# Patient Record
Sex: Male | Born: 1935 | Race: White | Smoking: Never smoker
Health system: Southern US, Community
[De-identification: ages and names within clinical notes are randomized; demographics above are authoritative.]

## PROBLEM LIST (undated history)

## (undated) DIAGNOSIS — K5790 Diverticulosis of intestine, part unspecified, without perforation or abscess without bleeding: Secondary | ICD-10-CM

## (undated) DIAGNOSIS — E559 Vitamin D deficiency, unspecified: Secondary | ICD-10-CM

## (undated) DIAGNOSIS — R7309 Other abnormal glucose: Secondary | ICD-10-CM

## (undated) DIAGNOSIS — I10 Essential (primary) hypertension: Secondary | ICD-10-CM

## (undated) DIAGNOSIS — E785 Hyperlipidemia, unspecified: Secondary | ICD-10-CM

## (undated) DIAGNOSIS — K648 Other hemorrhoids: Secondary | ICD-10-CM

## (undated) DIAGNOSIS — K922 Gastrointestinal hemorrhage, unspecified: Secondary | ICD-10-CM

## (undated) DIAGNOSIS — N138 Other obstructive and reflux uropathy: Secondary | ICD-10-CM

## (undated) DIAGNOSIS — K625 Hemorrhage of anus and rectum: Secondary | ICD-10-CM

## (undated) DIAGNOSIS — R739 Hyperglycemia, unspecified: Secondary | ICD-10-CM

## (undated) DIAGNOSIS — N401 Enlarged prostate with lower urinary tract symptoms: Secondary | ICD-10-CM

## (undated) DIAGNOSIS — R001 Bradycardia, unspecified: Secondary | ICD-10-CM

## (undated) HISTORY — DX: Hyperlipidemia, unspecified: E78.5

## (undated) HISTORY — DX: Benign prostatic hyperplasia with lower urinary tract symptoms: N13.8

## (undated) HISTORY — DX: Other hemorrhoids: K64.8

## (undated) HISTORY — PX: HEMORRHOID BANDING: SHX5850

## (undated) HISTORY — DX: Hemorrhage of anus and rectum: K62.5

## (undated) HISTORY — DX: Diverticulosis of intestine, part unspecified, without perforation or abscess without bleeding: K57.90

## (undated) HISTORY — DX: Bradycardia, unspecified: R00.1

## (undated) HISTORY — DX: Hyperglycemia, unspecified: R73.9

## (undated) HISTORY — PX: TONSILLECTOMY: SUR1361

## (undated) HISTORY — DX: Essential (primary) hypertension: I10

## (undated) HISTORY — DX: Other abnormal glucose: R73.09

## (undated) HISTORY — DX: Other obstructive and reflux uropathy: N40.1

## (undated) HISTORY — DX: Vitamin D deficiency, unspecified: E55.9

## (undated) HISTORY — PX: ROTATOR CUFF REPAIR: SHX139

## (undated) HISTORY — DX: Gastrointestinal hemorrhage, unspecified: K92.2

---

## 1968-10-07 HISTORY — PX: OTHER SURGICAL HISTORY: SHX169

## 1978-02-06 HISTORY — PX: ANAL FISSURE REPAIR: SHX2312

## 1997-12-14 ENCOUNTER — Ambulatory Visit (HOSPITAL_COMMUNITY): Admission: RE | Admit: 1997-12-14 | Discharge: 1997-12-14 | Payer: Self-pay | Admitting: Internal Medicine

## 1999-01-27 ENCOUNTER — Ambulatory Visit (HOSPITAL_COMMUNITY): Admission: RE | Admit: 1999-01-27 | Discharge: 1999-01-27 | Payer: Self-pay | Admitting: Gastroenterology

## 2002-11-17 ENCOUNTER — Encounter: Payer: Self-pay | Admitting: Orthopedic Surgery

## 2002-11-24 ENCOUNTER — Observation Stay (HOSPITAL_COMMUNITY): Admission: RE | Admit: 2002-11-24 | Discharge: 2002-11-25 | Payer: Self-pay | Admitting: Orthopedic Surgery

## 2005-04-11 ENCOUNTER — Ambulatory Visit (HOSPITAL_COMMUNITY): Admission: RE | Admit: 2005-04-11 | Discharge: 2005-04-12 | Payer: Self-pay | Admitting: Orthopedic Surgery

## 2005-06-09 ENCOUNTER — Ambulatory Visit: Payer: Self-pay | Admitting: Internal Medicine

## 2007-08-21 ENCOUNTER — Telehealth: Payer: Self-pay | Admitting: Gastroenterology

## 2008-07-08 ENCOUNTER — Ambulatory Visit: Payer: Self-pay | Admitting: Gastroenterology

## 2008-07-10 ENCOUNTER — Telehealth: Payer: Self-pay | Admitting: Gastroenterology

## 2008-07-16 ENCOUNTER — Ambulatory Visit: Payer: Self-pay | Admitting: Gastroenterology

## 2009-08-26 ENCOUNTER — Encounter (INDEPENDENT_AMBULATORY_CARE_PROVIDER_SITE_OTHER): Payer: Self-pay | Admitting: Internal Medicine

## 2009-08-26 ENCOUNTER — Ambulatory Visit
Admission: RE | Admit: 2009-08-26 | Discharge: 2009-08-26 | Payer: Self-pay | Source: Home / Self Care | Admitting: Internal Medicine

## 2009-08-26 ENCOUNTER — Ambulatory Visit: Payer: Self-pay | Admitting: Vascular Surgery

## 2010-03-08 NOTE — Progress Notes (Signed)
Summary: ? re prep  Phone Note Call from Patient Call back at Home Phone [phone]   Caller: Patient Call For: Arlyce Dice Reason for Call: Talk to Nurse Summary of Call: Patient has questions regarding prep instructions Initial call taken by: Tawni Levy,  July 10, 2008 8:56 AM  Follow-up for Phone Call        AWhite cranberry juice ok Follow-up by: Wyona Almas RN,  July 10, 2008 9:04 AM

## 2010-03-08 NOTE — Progress Notes (Signed)
Summary: Michael Ware is recall colon due?  Phone Note Call from Patient Call back at Home Phone [phone]   Caller: Patient Call For: KAPLAN Reason for Call: Talk to Nurse Details for Reason: [facility] COLON Summary of Call: pt had last COLON in 2000 and wants to sch colon but recalls the last one was done in the HOSP, Does pt have to sch his next colon @ hosp? Initial call taken by: [name],  August 21, 2007 3:48 PM  Follow-up for Phone Call        Pt. wants to know when he needs another Colonoscopy and if he needs an OV prior. Has occ. problems with anal fissure and bleeding. Per Dr.Kaplan-Pt. needs recall Colon in 01/2009, with an OV sometime prior to that. Pt. states he isn't having any problems currently.  Pt. instructed to call back as needed. (Recall is in IDX.)  Follow-up by: [name] LPN,  August 23, 2007 9:49 AM

## 2010-03-08 NOTE — Miscellaneous (Signed)
Summary: [facility] Previsit/prep  Clinical Lists Changes  Medications: Added new medication of DULCOLAX 5 MG  TBEC (BISACODYL) Day before procedure take 2 at 3pm and 2 at 8pm. - Signed Added new medication of METOCLOPRAMIDE HCL 10 MG  TABS (METOCLOPRAMIDE HCL) As per prep instructions. - Signed Added new medication of MIRALAX   POWD (POLYETHYLENE GLYCOL 3350) As per prep  instructions. - Signed Rx of DULCOLAX 5 MG  TBEC (BISACODYL) Day before procedure take 2 at 3pm and 2 at 8pm.;  #4 x 0;  Signed;  Entered by: Wyona Almas RN;  Authorized by: Louis Meckel MD;  Method used: Electronically to [facility]  [phone]*, [address], [place], [place], [place]  [address], Ph: [phone] or [phone], Fax: [phone] Rx of METOCLOPRAMIDE HCL 10 MG  TABS (METOCLOPRAMIDE HCL) As per prep instructions.;  #2 x 0;  Signed;  Entered by: Wyona Almas RN;  Authorized by: Louis Meckel MD;  Method used: Electronically to [facility]  [phone]*, [address] [place], [place], [place]  [address], Ph: [phone] or [phone], Fax: [phone] Rx of MIRALAX   POWD (POLYETHYLENE GLYCOL 3350) As per prep  instructions.;  #255gm x 0;  Signed;  Entered by: Wyona Almas RN;  Authorized by: Louis Meckel MD;  Method used: Electronically to [facility]  [phone]*, [address] [place], [place], [place]  [address], Ph: [phone] or [phone], Fax: [phone] Observations: Added new observation of ALLERGY REV: Done (07/08/2008 15:32) Added new observation of NKA: T (07/08/2008 15:32)    Prescriptions: MIRALAX   POWD (POLYETHYLENE GLYCOL 3350) As per prep  instructions.  #255gm x 0   Entered by:   Wyona Almas RN   Authorized by:   Louis Meckel MD   Signed by:   Wyona Almas RN on 07/08/2008   Method used:   Electronically to        [facility]  [phone]* (retail)       [address]       [place], [place]  [address]       Ph: [phone] or [phone]       Fax: [phone]   RxID:   [id number] METOCLOPRAMIDE HCL 10 MG  TABS (METOCLOPRAMIDE HCL) As per prep instructions.  #2 x 0   Entered by:   Wyona Almas RN   Authorized by:   Louis Meckel MD   Signed by:   Wyona Almas RN on 07/08/2008   Method used:   Electronically to        [facility]  [phone]* (retail)       [address]       [place], [place]  [address]       Ph: [phone] or [phone]       Fax: [phone]   RxID:   [id number] DULCOLAX 5 MG  TBEC (BISACODYL) Day before procedure take 2 at 3pm and 2 at 8pm.  #4 x 0   Entered by:   Wyona Almas RN   Authorized by:   Louis Meckel MD   Signed by:   Wyona Almas RN on 07/08/2008   Method used:   Electronically to        [facility]  [phone]* (retail)       [address] [place]  [place], [place]  [address]       Ph: [phone] or [phone]       Fax: [phone]   RxID:   [id number]

## 2010-03-08 NOTE — Procedures (Signed)
Summary: Colonoscopy   Colonoscopy  Procedure date:  07/16/2008  Findings:      [facility]    Procedures Next Due Date:    Colonoscopy: 07/2018  COLONOSCOPY PROCEDURE REPORT  PATIENT:  Michael, Ware  MR#:  [id number] BIRTHDATE:   May 06, 1931, 76 yrs. old   GENDER:   male  ENDOSCOPIST:   Barbette Hair. Arlyce Dice, MD Referred by: Lucky Cowboy, M.D.  PROCEDURE DATE:  07/16/2008 PROCEDURE:  Colonoscopy, diagnostic ASA CLASS:   Class II INDICATIONS: Routine Risk Screening   MEDICATIONS:    Fentanyl 50 mcg IV, Versed 7 mg IV  DESCRIPTION OF PROCEDURE:   After the risks benefits and alternatives of the procedure were thoroughly explained, informed consent was obtained.  Digital rectal exam was performed and revealed no abnormalities.   The LB CF-[id number] endoscope was introduced through the anus and advanced to the cecum, which was identified by both the appendix and ileocecal valve, without limitations.  The quality of the prep was excellent, using MiraLax.  The instrument was then slowly withdrawn as the colon was fully examined. <<PROCEDUREIMAGES>>                <<OLD IMAGES>>  FINDINGS:  Severe diverticulosis was found in the sigmoid colon (see image2, image3, and image14).  Scattered diverticula were found transverse to sigmoid  This was otherwise a normal examination of the colon (see image4, image6, image7, image10, image11, and image15).   Retroflexed views in the rectum revealed no abnormalities.    The scope was then withdrawn from the patient and the procedure completed.  COMPLICATIONS:   None  ENDOSCOPIC IMPRESSION:  1) Severe diverticulosis in the sigmoid colon  2) Diverticula, scattered in the transverse to sigmoid  3) Otherwise normal examination RECOMMENDATIONS:  1) Continue current colorectal screening recommendations for "routine risk" patients with a repeat colonoscopy in 10 years.  REPEAT EXAM:   In 10 year(s) for  Colonoscopy.   _______________________________ Barbette Hair. Arlyce Dice, MD  CC:

## 2010-06-24 NOTE — Op Note (Signed)
NAMEMARTY, UY                ACCOUNT NO.:  [id number]   MEDICAL RECORD NO.:  [id number]          PATIENT TYPE:  OIB   LOCATION:  1007                         FACILITY:  [facility]   PHYSICIAN:  Marlowe Kays, M.D.  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   OPERATION:  Right shoulder arthroscopy (normal glenohumeral examination and  arthroscopic subacromial and distal clavicle decompression).   SURGEON:  Marlowe Kays, M.D.   ASSISTANTDruscilla Brownie. Cherlynn June.   ANESTHESIA:  General.   JUSTIFICATION FOR THE PROCEDURE:  He had a similar pathology for this  procedure in the left shoulder and has done well.  MRI has shown rotator  cuff tendinopathy with impingement of the distal clavicle and the acromion.   DESCRIPTION OF PROCEDURE:  Under satisfactory general anesthesia, beach-  chair position, sliding frame, right shoulder girdle was prepped with  DuraPrep and draped in a sterile field.  The remainder of the shoulder joint  was marked out and placement for the lateral and posterior portals and  subacromial space were all infiltrated with 0.5% Marcaine with Adrenalin.  Through a posterior soft spot portal I atraumatically entered the  glenohumeral joint.  On inspection there was a little labral degenerative  fraying but nothing of any significance, and there was no need for any  arthroscopic treatment.  I then redirected the scope and subacromial area  through a lateral portal and used a 4.2 shaver.  There was a modest  subacromial bursitis which I resected.  She had fairly significant  impingement problem involving the acromion and distal clavicle.  I brought  in the  ArthroCare vaporizer removing soft tissue from around both and then  used the 4 mm oval bur removing significant resected removing sufficient  bone in the subacromial area and then working the distal clavicle removing  at least 8 mm of the undersurface correcting any impingement there on the  rotator cuff.  The decompression was confirmed with pictures with the arm to  the side arm  and abducted with the vaporizer in place.  We then removed all  fluid possible from subacromial space.  The two portals were reinfiltrated  with 1/2% Marcaine with Adrenalin as was the subacromial space.  The portals  were closed with 4-0 nylon followed by Betadine.  Adaptic, dry sterile  dressing and shoulder immobilizer were applied.  He tolerated the procedure  well.  At the time of this dictation he was on his way to the recovery room  in satisfactory condition with no known complications.           ______________________________  Marlowe Kays, M.D.    JA/MEDQ  D:  04/11/2005  T:  04/12/2005  Job:  [id number]

## 2010-06-24 NOTE — Op Note (Signed)
NAMEDHEERAJ, HAIL                          ACCOUNT NO.:  [id number]   MEDICAL RECORD NO.:  [id number]                   PATIENT TYPE:  AMB   LOCATION:  DAY                                  FACILITY:  [facility]   PHYSICIAN:  Marlowe Kays, M.D.               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   POSTOPERATIVE DIAGNOSIS:  Chronic impingement syndrome with partial rotator  cuff of the left shoulder.   OPERATION/PROCEDURE:  Left shoulder arthroscopy (normal examination) with:  1. Arthroscopic subacromial decompression.  2. Arthroscopic resection distal inferior clavicle.   SURGEON:  Marlowe Kays, M.D.   ASSISTANT:  Madlyn Frankel. Charlann Boxer, M.D.   ANESTHESIA:  General.   PATHOLOGY AND INDICATIONS FOR PROCEDURE:  Chronic progressive pain with loss  of motion of the left shoulder.  MRI showing partial rotator cuff tear on  the bursal surface.  The [facility] joint has some mild arthritic changes but was  felt to be an impingement problem on the inferior surfaces as was the  acromion.   DESCRIPTION OF PROCEDURE:  Satisfactory general anesthesia.  Beach chair  position on [employer] frame.  The shoulder girdle was prepped with a  DuraPrep, draped in a sterile field.  The shoulder joint was marked out and  subacromial space, lateral and posterior soft spot portals were infiltrated  with 0.5% Marcaine with adrenalin.  Through the posterior soft spot portal,  I was able to atraumatically enter the glenohumeral joint and on inspection  it was normal other than some mild degenerative changes of the glenoid.  Representative pictures were taken.  I then redirected the scope into the  subacromial area in through a lateral portal and introduced the 4.2 shaver  and began removing fairly exuberant  subacromial bursa.  I introduced the  ArthroCare vaporizer and began removing soft tissue from around the  underneath surface of the distal acromion including the coracoacromial  ligament, and then worked medially underneath the distal clavicle which was  digging into the rotator cuff.  Then used a 4.0 oval bur to begin removing  bone from the underneath surfaces of the acromion and distal clavicle and  worked back and forth between the vaporizer and the bur until we had a wide  decompression.  A good bit of this time was spent removing the underneath  surface of the distal clavicle which, as discussed above, was an impinging  factor.  At the conclusion of the decompression, he had wide decompression,  not only of the distal clavicle but also the acromion with the arm to  the side and arm abducted.  I then removed all fluid from the joint, closed  the two portals with interrupted 4-0 nylon mattress  sutures, Betadine and  Adaptic, dry sterile dressing, short immobilizer applied.  He tolerated the  procedure well and was taken to the recovery room in satisfactory condition  with no complications.                                                    Marlowe Kays, M.D.    JA/MEDQ  D:  11/24/2002  T:  11/24/2002  Job:  [id number]

## 2011-01-19 ENCOUNTER — Telehealth: Payer: Self-pay | Admitting: Gastroenterology

## 2011-01-19 NOTE — Telephone Encounter (Signed)
Pt states that he went to [place] recently and he drank some [place] wine and had some explosive diarrhea. He then went to [place] and ate something that didn't agree with him and he had what he would call a "silent wet BM." He then went to [place] and had some diarrhea and states that he overdosed on Imodium. He states he took some Cipro but he did not finish the prescription. Pt states that the last 3 BM's that he has had were painful. He states that usually when he had a bowel movement he has to wipe himself but these have almost been "greased" and they slide right out. Pt reached down in the toilet and placed the stool on toilet paper and took a picture. He states he can send it to [place] if we want to see it. Pts sister states it looked like a bunch of grapes. Dr. Arlyce Dice please advise.

## 2011-01-20 NOTE — Telephone Encounter (Signed)
Called pt and he states he wants to have a bowel movement. States he has never been constipated before in his like and he is worried something is wrong. States the bowel movement he did have last night was 6" long and like a bunch of grapes. Later last night he had one about half that size and still like grapes. Pt has not had any results from the miralax he took, just passed a little air with some mucous in it. Pt mentioned he would feel better with some sort of diagnostic test to make sure nothing is wrong. Please advise.

## 2011-01-20 NOTE — Telephone Encounter (Signed)
hyomax 0.375mg  bid prn

## 2011-01-20 NOTE — Telephone Encounter (Signed)
He should not take any more medication. Unless he is bloated and has distention it is very unlikely that he has an obstruction.

## 2011-01-20 NOTE — Telephone Encounter (Signed)
Spoke with pt and he is upset. States his PCP is off on Fridays. He wants me to ask you again if you will order an xray to make sure everything is ok. States he "knows his body and something is not right." Please advise.

## 2011-01-20 NOTE — Telephone Encounter (Signed)
Spoke with pt and he states that after he talked to me last night he had another small stool but it looked like a bunch of grapes. Pt states he has pain below his belly button and has a feeling of urgency that he needs to go to the bathroom. Pt was concerned that he might have some sort of blockage. He took a dose of miralax last night at midnight and another dose at 5am, 2 probiotics, and citracel. He has not gone to the bathroom again. Dr. Arlyce Dice please advise.

## 2011-01-20 NOTE — Telephone Encounter (Signed)
I think he needs to be seen, probably by his PCP

## 2011-01-20 NOTE — Telephone Encounter (Signed)
I suggest the patient go to [place] and get some good rice and binding food. Nevertheless, he should take fiber daily and see what happens over the next couple of weeks

## 2011-01-20 NOTE — Telephone Encounter (Signed)
Is there anything the pt can do or take for the pain/discomfort?

## 2011-01-23 ENCOUNTER — Ambulatory Visit (INDEPENDENT_AMBULATORY_CARE_PROVIDER_SITE_OTHER): Payer: Medicare Other | Admitting: Physician Assistant

## 2011-01-23 ENCOUNTER — Other Ambulatory Visit: Payer: Self-pay | Admitting: Gastroenterology

## 2011-01-23 ENCOUNTER — Other Ambulatory Visit (INDEPENDENT_AMBULATORY_CARE_PROVIDER_SITE_OTHER): Payer: Medicare Other

## 2011-01-23 ENCOUNTER — Ambulatory Visit (INDEPENDENT_AMBULATORY_CARE_PROVIDER_SITE_OTHER)
Admission: RE | Admit: 2011-01-23 | Discharge: 2011-01-23 | Disposition: A | Discharge status: Home / Self Care | Payer: Self-pay | Source: Ambulatory Visit | Attending: Gastroenterology | Admitting: Gastroenterology

## 2011-01-23 ENCOUNTER — Encounter: Payer: Self-pay | Admitting: Physician Assistant

## 2011-01-23 DIAGNOSIS — K5732 Diverticulitis of large intestine without perforation or abscess without bleeding: Secondary | ICD-10-CM

## 2011-01-23 DIAGNOSIS — K5792 Diverticulitis of intestine, part unspecified, without perforation or abscess without bleeding: Secondary | ICD-10-CM

## 2011-01-23 DIAGNOSIS — E785 Hyperlipidemia, unspecified: Secondary | ICD-10-CM

## 2011-01-23 DIAGNOSIS — R109 Unspecified abdominal pain: Secondary | ICD-10-CM

## 2011-01-23 DIAGNOSIS — I10 Essential (primary) hypertension: Secondary | ICD-10-CM

## 2011-01-23 DIAGNOSIS — K5730 Diverticulosis of large intestine without perforation or abscess without bleeding: Secondary | ICD-10-CM | POA: Insufficient documentation

## 2011-01-23 LAB — CBC WITH DIFFERENTIAL/PLATELET
Basophils Absolute: 0 10*3/uL (ref 0.0–0.1)
Basophils Relative: 0.4 % (ref 0.0–3.0)
Eosinophils Absolute: 0.1 10*3/uL (ref 0.0–0.7)
Eosinophils Relative: 1.3 % (ref 0.0–5.0)
HCT: 47.5 % (ref 39.0–52.0)
Hemoglobin: 16.3 g/dL (ref 13.0–17.0)
Lymphocytes Relative: 18.1 % (ref 12.0–46.0)
Lymphs Abs: 1.8 10*3/uL (ref 0.7–4.0)
MCHC: 34.4 g/dL (ref 30.0–36.0)
MCV: 104.8 fl — ABNORMAL HIGH (ref 78.0–100.0)
Monocytes Absolute: 1.8 10*3/uL — ABNORMAL HIGH (ref 0.1–1.0)
Monocytes Relative: 17.7 % — ABNORMAL HIGH (ref 3.0–12.0)
Neutro Abs: 6.4 10*3/uL (ref 1.4–7.7)
Neutrophils Relative %: 62.5 % (ref 43.0–77.0)
Platelets: 204 10*3/uL (ref 150.0–400.0)
RBC: 4.53 Mil/uL (ref 4.22–5.81)
RDW: 13.3 % (ref 11.5–14.6)
WBC: 10.2 10*3/uL (ref 4.5–10.5)

## 2011-01-23 LAB — BASIC METABOLIC PANEL
BUN: 27 mg/dL — ABNORMAL HIGH (ref 6–23)
CO2: 30 mEq/L (ref 19–32)
Calcium: 9.1 mg/dL (ref 8.4–10.5)
Chloride: 101 mEq/L (ref 96–112)
Creatinine, Ser: 1.1 mg/dL (ref 0.4–1.5)
GFR: 68.25 mL/min (ref >60.00)
Glucose, Bld: 96 mg/dL (ref 70–99)
Potassium: 4.1 mEq/L (ref 3.5–5.1)
Sodium: 140 mEq/L (ref 135–145)

## 2011-01-23 IMAGING — CR DG ABDOMEN 1V
2 series · 2 of 2 positions shown · non-contrast
Comparison: None.

CLINICAL DATA: Mid lower abdominal pain for 3 days

ABDOMEN - 1 VIEW

[view not recorded (1 of 2)]
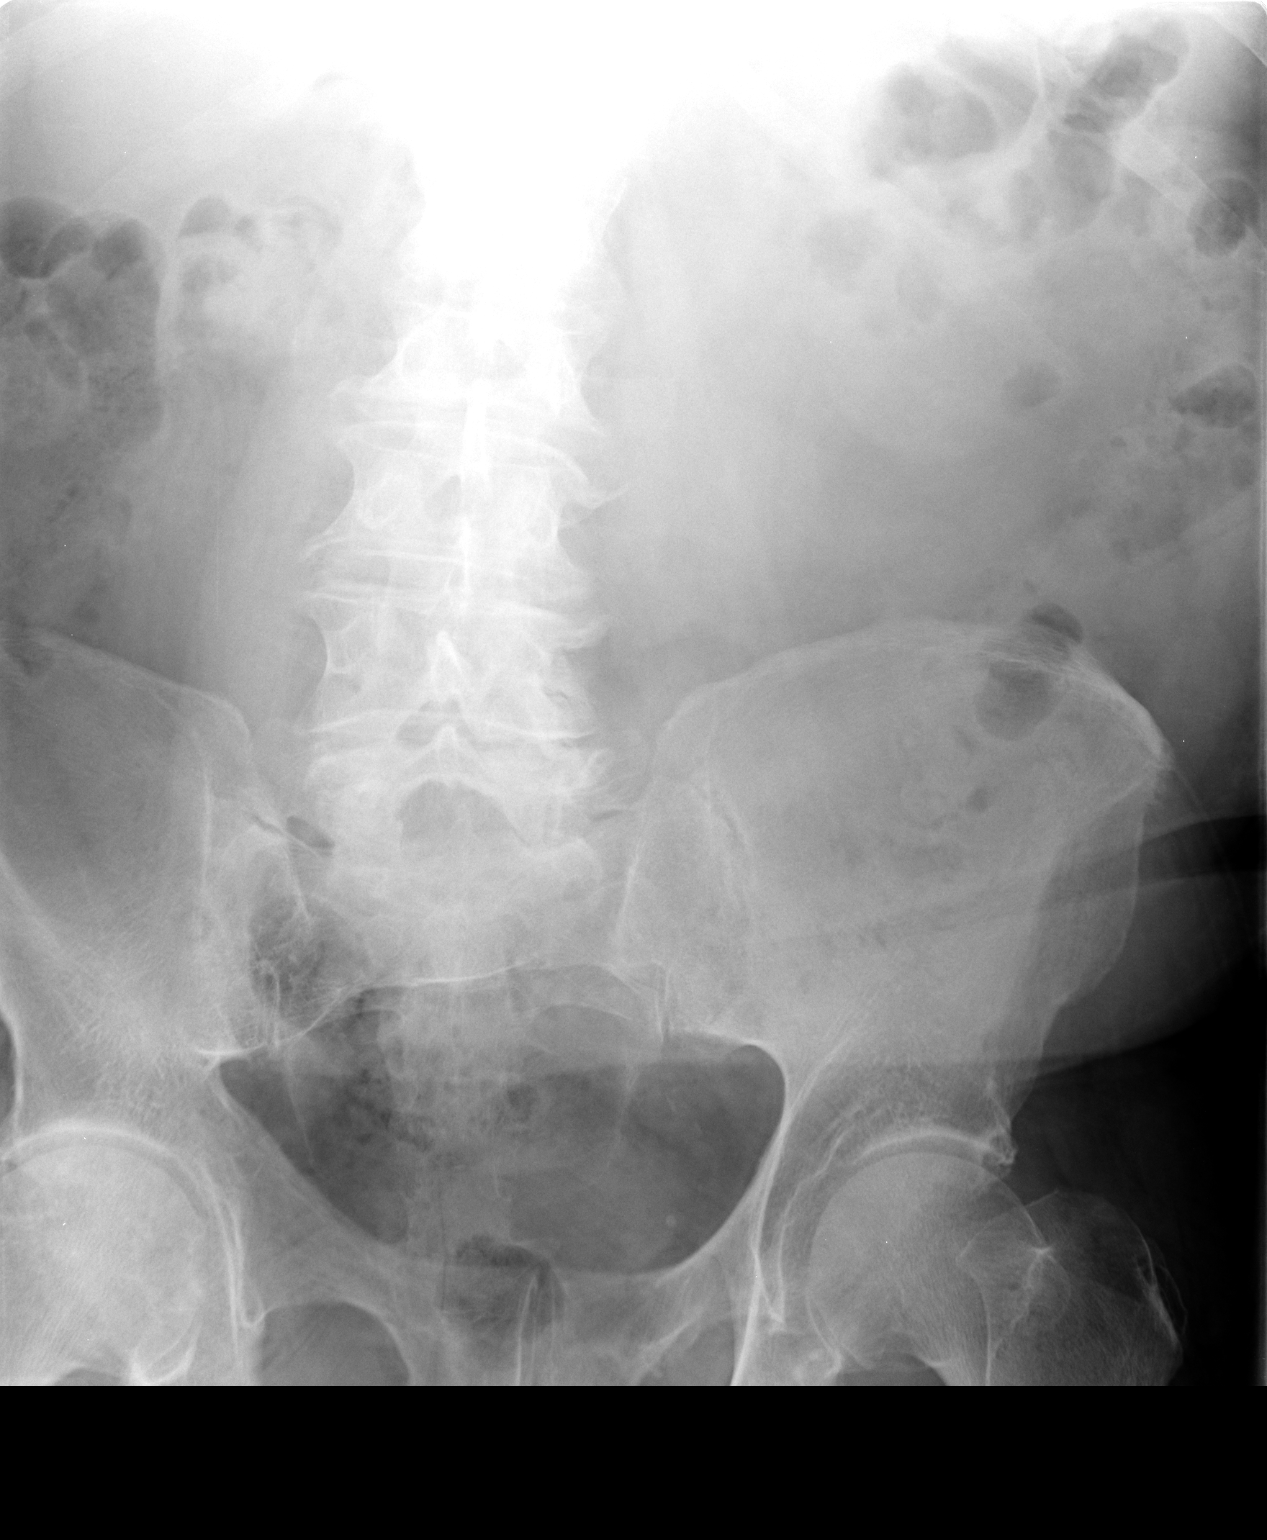

[view not recorded (2 of 2)]
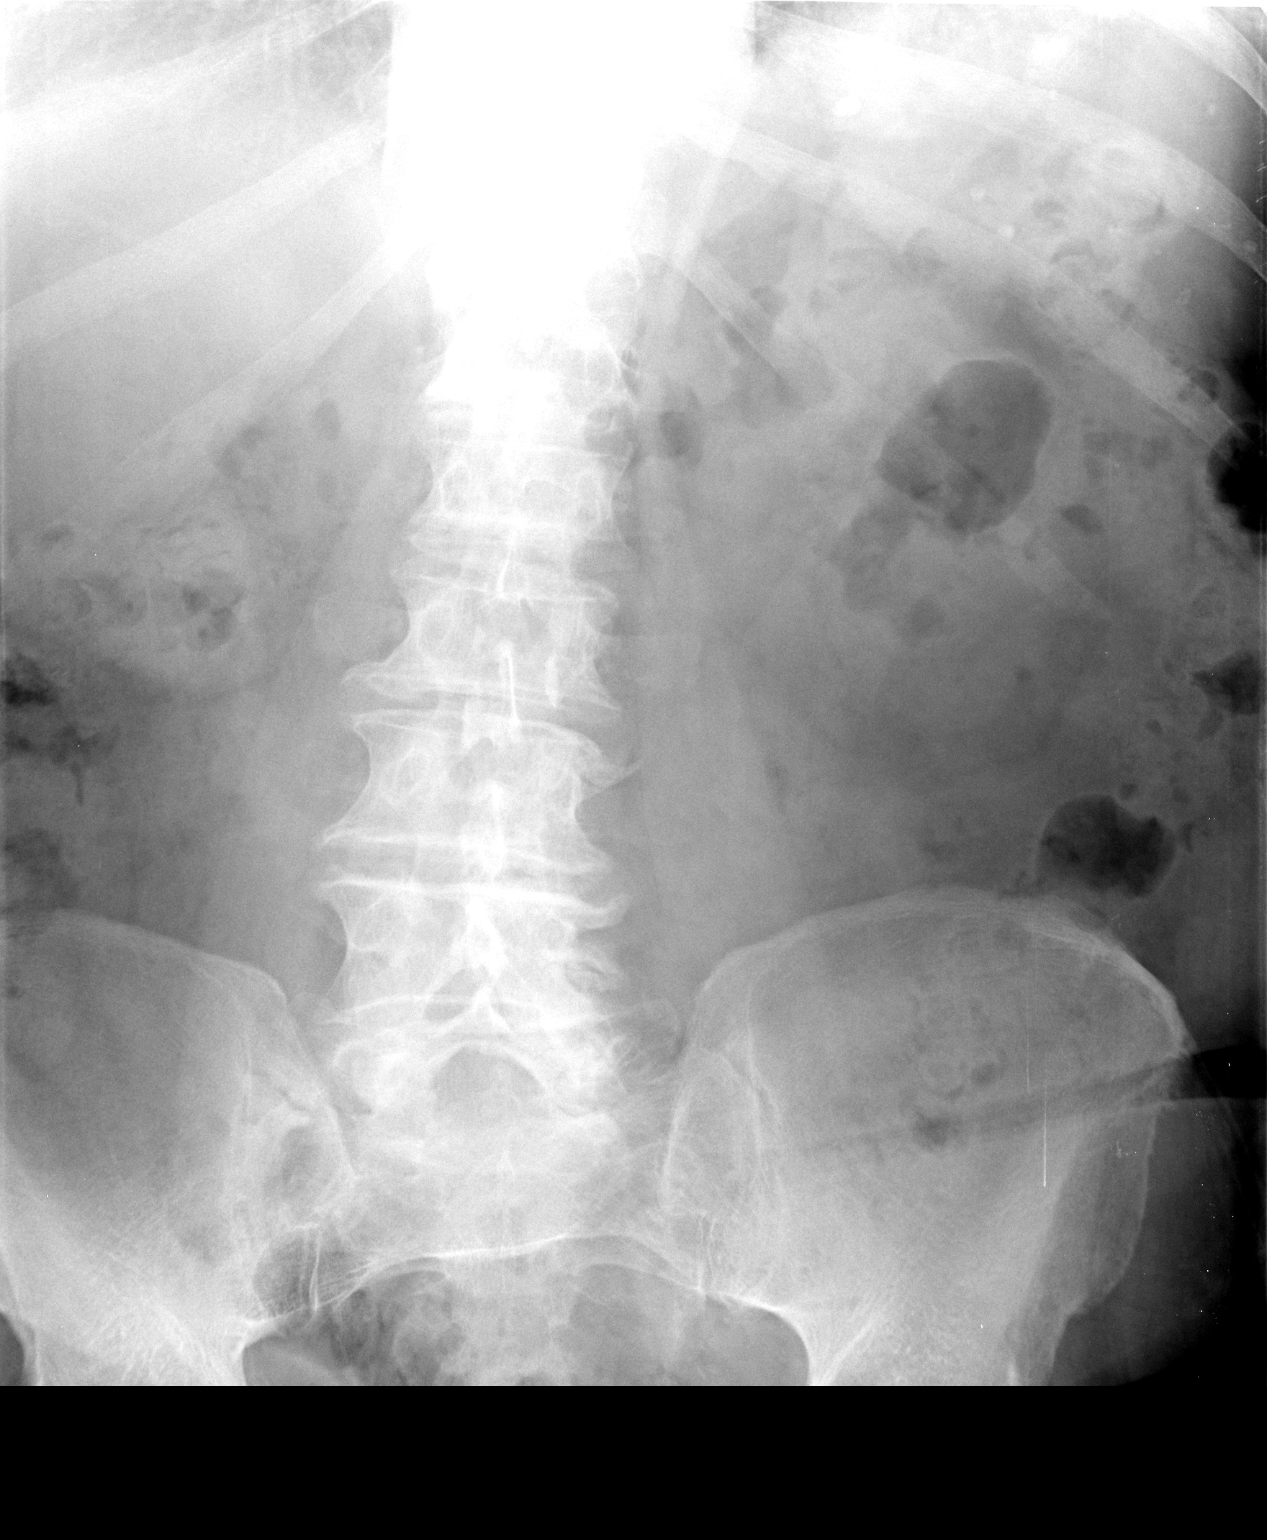

[2 of 2 positions shown; findings below may reference images not displayed]

FINDINGS: A supine film of the abdomen shows a nonspecific bowel
gas pattern.  No bowel wall edema is evident by plain film.  No
opaque calculi are noted.  There are calcifications in the left
upper quadrant consistent with calcified splenic granulomas due to
prior granulomatous disease.  There is degenerative change
throughout the lumbar spine.  The SI joints appear corticated.
IMPRESSION: No bowel obstruction.  Moderate amount of feces throughout the
colon.  Changes of prior granulomatous disease.

## 2011-01-23 MED ORDER — METRONIDAZOLE 500 MG PO TABS: 500.0000 mg | ORAL_TABLET | Freq: Two times a day (BID) | ORAL | Status: AC

## 2011-01-23 MED ORDER — CIPROFLOXACIN HCL 500 MG PO TABS: 500.0000 mg | ORAL_TABLET | Freq: Two times a day (BID) | ORAL | Status: AC

## 2011-01-23 NOTE — Patient Instructions (Signed)
Please go to the basement level to have your labs drawn.   We have printed the prescriptions for the Cipro and the Flagyl.  Amy Esterwood PA-C will be calling you with the results.

## 2011-01-23 NOTE — Telephone Encounter (Signed)
Pt to come and have KUB today. Pt scheduled to see Mike Gip PA today at 3:30pm. Pt aware of appt date and time.

## 2011-01-23 NOTE — Progress Notes (Addendum)
Subjective:    Patient ID: Michael Ware, male    DOB: May 02, 1931, 77 y.o.   MRN: [id number]  HPI Michael Ware is a 77 year old white male primary patient of Dr. Oneta Rack, known to Dr. Arlyce Dice from prior colonoscopies. His last colon was done in June of 2010 and at that time he was noted to have severe diverticulosis in the sigmoid colon and scattered diverticuli found in the transverse to sigmoid.  He comes in today with complaints of onset of constipation early last week which is very unusual for him. He was constipated on Tuesday and Wednesday and then started having lower abdominal pain on Thursday which became fairly sharp and radiated across his lower abdomen. This was associated with some urgency for bowel movements and rectal pressure. He was able to pass some stool which was "like a clump of grapes". He tried a fleets enema and did not pass any stool after that and had called here on Friday. He was concerned that he may have an obstruction and was asking for an x-ray. He he spoke with Dr. Marzetta Board nurse, an x-ray was not ordered. He continued to hurt on Saturday and says he also had a temp Saturday to 101 . He took a bottle of mag citrate although was able to have several bowel movements and says he started feeling better. On Sunday he also had a temp he thinks to 102. He has been able to eat, states that he passed a little bit of blood on the tissue after having multiple bowel movements but was not seen any melena or blood mixed in with his bowel movements. He says  that his pain is better at this point but has not resolved. He was upset when he came in today because he had not received a call back from the doctor .    Review of Systems  Constitutional: Positive for fever.  HENT: Negative.   Eyes: Negative.   Respiratory: Negative.   Cardiovascular: Negative.   Gastrointestinal: Positive for abdominal pain, constipation and blood in stool.  Genitourinary: Negative.   Musculoskeletal: Negative.     Skin: Negative.   Neurological: Negative.   Hematological: Negative.   Psychiatric/Behavioral: Negative.    Outpatient Encounter Prescriptions as of 01/23/2011  Medication Sig Dispense Refill  . aspirin 81 MG tablet Take 81 mg by mouth daily.        Marland Kitchen atenolol (TENORMIN) 100 MG tablet Take 100 mg by mouth daily.        . B Complex-C (B-COMPLEX WITH VITAMIN C) tablet Take 1 tablet by mouth daily.        Marland Kitchen CRANBERRY EXTRACT PO Take 1 capsule by mouth daily.        . Ergocalciferol (VITAMIN D2) 2000 UNITS TABS Take 2 capsules by mouth daily.        . pravastatin (PRAVACHOL) 40 MG tablet Take 40 mg by mouth daily.        . Saw Palmetto, Serenoa repens, (SAW PALMETTO PO) Take 1 capsule by mouth daily.        . Selenium (SELENIMIN PO) Take 1 capsule by mouth daily.        Marland Kitchen Specialty Vitamins Products (MAGNESIUM, AMINO ACID CHELATE,) 133 MG tablet Take 1 tablet by mouth daily.        Marland Kitchen VITAMIN B1-B12 PO Take 1 capsule by mouth daily.        . Zinc 50 MG CAPS Take 1 capsule by mouth daily.        Marland Kitchen  ciprofloxacin (CIPRO) 500 MG tablet Take 1 tablet (500 mg total) by mouth 2 (two) times daily.  20 tablet  0  . metroNIDAZOLE (FLAGYL) 500 MG tablet Take 1 tablet (500 mg total) by mouth 2 (two) times daily.  20 tablet  0      No Known Allergies  Objective:   Physical Exam Well-developed elderly white male in no acute distress, alert and oriented x3, temperature 97.6, HEENT; nontraumatic, ,EOMI PERRLA sclera anicteric, Supple no JVD, Cardiovascular; regular rate and rhythm with S1-S2 no murmur rub or gallop, Pulmonary; clear bilaterally, Abdomen; large; soft he has a reducible ventral hernia, he is tender in the left lower quadrant with mild guarding, no rebound ,palpable mass or hepatosplenomegaly, Rectal; exam not done, Extremities ;no clubbing cyanosis or edema, Psych; mood and affect appropriate        Assessment & Plan:  #78 77 year old male with onset of obstipation about 6 days ago  followed by onset of lower abdominal pain on Friday, 01/20/2011. His symptoms are most consistent with diverticulitis, and though he feels better at this point clinically he is still tender and am concerned that he does have active diverticulitis.  Plan; Will check CBC and BMET today Start Cipro 500 mg by mouth twice daily x10 days Start Flagyl 500 mg twice daily x10 days We'll followup KUB which was done earlier today and is not read as yet. Patient was advised that if his pain worsens at any point or he develops recurrent fever that he will need to call and will need CT scan of his abdomen and pelvis.  Reviewed and agree with management. Pt to follow-up to ensure improvement/resolution. Carie Caddy. Pyrtle, M.D.  01/25/2011

## 2011-01-23 NOTE — Telephone Encounter (Signed)
Get KUB and schedule OV next 24 hours

## 2011-01-24 ENCOUNTER — Telehealth: Payer: Self-pay

## 2011-01-24 NOTE — Telephone Encounter (Signed)
Pt states that he has not had anymore fever and the pain is better. Pt states he has not had a BM since Saturday after he took the Mag Citrate. Pt wants to know what he should do about having a BM. Please advise.

## 2011-01-24 NOTE — Telephone Encounter (Signed)
Message copied by Michele Mcalpine on Tue Jan 24, 2011  3:11 PM ------      Message from: Beebe, [place] S      Created: Tue Jan 24, 2011  9:39 AM       Please call mr Fike and let him know the xray looks fine, and his lab work was noraml as weel- he has diverticulitis-see how he is feeling and ask if he has had any more fever- if so he may need a Ct.. thanks

## 2011-01-24 NOTE — Telephone Encounter (Signed)
Pt aware.

## 2011-01-24 NOTE — Telephone Encounter (Signed)
May start Miralax 17 gm daily in 8 oz water until having normal bm's

## 2011-02-14 DIAGNOSIS — M47817 Spondylosis without myelopathy or radiculopathy, lumbosacral region: Secondary | ICD-10-CM | POA: Diagnosis not present

## 2011-04-19 DIAGNOSIS — L570 Actinic keratosis: Secondary | ICD-10-CM | POA: Diagnosis not present

## 2011-05-23 ENCOUNTER — Telehealth: Payer: Self-pay | Admitting: Gastroenterology

## 2011-05-23 NOTE — Telephone Encounter (Signed)
Pt called and states that he had a bowel movement a couple of weeks ago and noticed that there was bright red blood on the toilet seat. He has been watching and states he has not seen anything since that time. Offered pt an appt with [facility] if pt wanted to come in. Pt states he will call us if this happens again. He states he had a visit with his PCP in Nov and the stool cards were ok.

## 2011-06-27 DIAGNOSIS — M47817 Spondylosis without myelopathy or radiculopathy, lumbosacral region: Secondary | ICD-10-CM | POA: Diagnosis not present

## 2011-08-01 DIAGNOSIS — S139XXA Sprain of joints and ligaments of unspecified parts of neck, initial encounter: Secondary | ICD-10-CM | POA: Diagnosis not present

## 2011-08-01 DIAGNOSIS — M25569 Pain in unspecified knee: Secondary | ICD-10-CM | POA: Diagnosis not present

## 2011-10-10 DIAGNOSIS — M47817 Spondylosis without myelopathy or radiculopathy, lumbosacral region: Secondary | ICD-10-CM | POA: Diagnosis not present

## 2011-12-19 DIAGNOSIS — E559 Vitamin D deficiency, unspecified: Secondary | ICD-10-CM | POA: Diagnosis not present

## 2011-12-19 DIAGNOSIS — E782 Mixed hyperlipidemia: Secondary | ICD-10-CM | POA: Diagnosis not present

## 2011-12-19 DIAGNOSIS — I10 Essential (primary) hypertension: Secondary | ICD-10-CM | POA: Diagnosis not present

## 2011-12-19 DIAGNOSIS — L570 Actinic keratosis: Secondary | ICD-10-CM | POA: Diagnosis not present

## 2011-12-19 DIAGNOSIS — Z125 Encounter for screening for malignant neoplasm of prostate: Secondary | ICD-10-CM | POA: Diagnosis not present

## 2011-12-19 DIAGNOSIS — Z79899 Other long term (current) drug therapy: Secondary | ICD-10-CM | POA: Diagnosis not present

## 2011-12-19 DIAGNOSIS — R7309 Other abnormal glucose: Secondary | ICD-10-CM | POA: Diagnosis not present

## 2011-12-20 DIAGNOSIS — M47817 Spondylosis without myelopathy or radiculopathy, lumbosacral region: Secondary | ICD-10-CM | POA: Diagnosis not present

## 2011-12-21 DIAGNOSIS — R7309 Other abnormal glucose: Secondary | ICD-10-CM | POA: Diagnosis not present

## 2011-12-21 DIAGNOSIS — E559 Vitamin D deficiency, unspecified: Secondary | ICD-10-CM | POA: Diagnosis not present

## 2011-12-21 DIAGNOSIS — E782 Mixed hyperlipidemia: Secondary | ICD-10-CM | POA: Diagnosis not present

## 2011-12-21 DIAGNOSIS — I10 Essential (primary) hypertension: Secondary | ICD-10-CM | POA: Diagnosis not present

## 2011-12-21 DIAGNOSIS — Z1212 Encounter for screening for malignant neoplasm of rectum: Secondary | ICD-10-CM | POA: Diagnosis not present

## 2011-12-21 DIAGNOSIS — Z23 Encounter for immunization: Secondary | ICD-10-CM | POA: Diagnosis not present

## 2012-01-10 DIAGNOSIS — E782 Mixed hyperlipidemia: Secondary | ICD-10-CM | POA: Diagnosis not present

## 2012-01-10 DIAGNOSIS — H6120 Impacted cerumen, unspecified ear: Secondary | ICD-10-CM | POA: Diagnosis not present

## 2012-04-23 DIAGNOSIS — M47817 Spondylosis without myelopathy or radiculopathy, lumbosacral region: Secondary | ICD-10-CM | POA: Diagnosis not present

## 2012-05-09 DIAGNOSIS — M47817 Spondylosis without myelopathy or radiculopathy, lumbosacral region: Secondary | ICD-10-CM | POA: Diagnosis not present

## 2012-05-15 DIAGNOSIS — M533 Sacrococcygeal disorders, not elsewhere classified: Secondary | ICD-10-CM | POA: Diagnosis not present

## 2012-05-27 DIAGNOSIS — M549 Dorsalgia, unspecified: Secondary | ICD-10-CM | POA: Diagnosis not present

## 2012-05-27 DIAGNOSIS — M47817 Spondylosis without myelopathy or radiculopathy, lumbosacral region: Secondary | ICD-10-CM | POA: Diagnosis not present

## 2012-05-27 DIAGNOSIS — S139XXA Sprain of joints and ligaments of unspecified parts of neck, initial encounter: Secondary | ICD-10-CM | POA: Diagnosis not present

## 2012-06-24 DIAGNOSIS — M545 Low back pain, unspecified: Secondary | ICD-10-CM | POA: Diagnosis not present

## 2012-06-24 DIAGNOSIS — M542 Cervicalgia: Secondary | ICD-10-CM | POA: Diagnosis not present

## 2012-07-12 DIAGNOSIS — M5430 Sciatica, unspecified side: Secondary | ICD-10-CM | POA: Diagnosis not present

## 2012-07-17 DIAGNOSIS — IMO0001 Reserved for inherently not codable concepts without codable children: Secondary | ICD-10-CM | POA: Diagnosis not present

## 2012-07-24 DIAGNOSIS — M47817 Spondylosis without myelopathy or radiculopathy, lumbosacral region: Secondary | ICD-10-CM | POA: Diagnosis not present

## 2012-07-27 DIAGNOSIS — M47817 Spondylosis without myelopathy or radiculopathy, lumbosacral region: Secondary | ICD-10-CM | POA: Diagnosis not present

## 2012-08-02 DIAGNOSIS — M47817 Spondylosis without myelopathy or radiculopathy, lumbosacral region: Secondary | ICD-10-CM | POA: Diagnosis not present

## 2012-08-05 DIAGNOSIS — M47817 Spondylosis without myelopathy or radiculopathy, lumbosacral region: Secondary | ICD-10-CM | POA: Diagnosis not present

## 2012-08-07 DIAGNOSIS — M545 Low back pain, unspecified: Secondary | ICD-10-CM | POA: Diagnosis not present

## 2012-08-21 DIAGNOSIS — S8263XA Displaced fracture of lateral malleolus of unspecified fibula, initial encounter for closed fracture: Secondary | ICD-10-CM | POA: Diagnosis not present

## 2012-08-21 DIAGNOSIS — M47817 Spondylosis without myelopathy or radiculopathy, lumbosacral region: Secondary | ICD-10-CM | POA: Diagnosis not present

## 2012-08-28 DIAGNOSIS — M545 Low back pain, unspecified: Secondary | ICD-10-CM | POA: Diagnosis not present

## 2012-09-11 DIAGNOSIS — R7309 Other abnormal glucose: Secondary | ICD-10-CM | POA: Diagnosis not present

## 2012-09-11 DIAGNOSIS — Z79899 Other long term (current) drug therapy: Secondary | ICD-10-CM | POA: Diagnosis not present

## 2012-09-11 DIAGNOSIS — E782 Mixed hyperlipidemia: Secondary | ICD-10-CM | POA: Diagnosis not present

## 2012-09-11 DIAGNOSIS — I10 Essential (primary) hypertension: Secondary | ICD-10-CM | POA: Diagnosis not present

## 2012-09-11 DIAGNOSIS — E559 Vitamin D deficiency, unspecified: Secondary | ICD-10-CM | POA: Diagnosis not present

## 2012-09-13 DIAGNOSIS — IMO0002 Reserved for concepts with insufficient information to code with codable children: Secondary | ICD-10-CM | POA: Diagnosis not present

## 2012-09-13 DIAGNOSIS — E782 Mixed hyperlipidemia: Secondary | ICD-10-CM | POA: Diagnosis not present

## 2012-09-13 DIAGNOSIS — R7309 Other abnormal glucose: Secondary | ICD-10-CM | POA: Diagnosis not present

## 2012-09-13 DIAGNOSIS — I10 Essential (primary) hypertension: Secondary | ICD-10-CM | POA: Diagnosis not present

## 2012-09-16 DIAGNOSIS — G5620 Lesion of ulnar nerve, unspecified upper limb: Secondary | ICD-10-CM | POA: Diagnosis not present

## 2012-09-17 DIAGNOSIS — M47817 Spondylosis without myelopathy or radiculopathy, lumbosacral region: Secondary | ICD-10-CM | POA: Diagnosis not present

## 2012-09-17 DIAGNOSIS — M48061 Spinal stenosis, lumbar region without neurogenic claudication: Secondary | ICD-10-CM | POA: Diagnosis not present

## 2012-09-18 ENCOUNTER — Other Ambulatory Visit: Payer: Self-pay | Admitting: Orthopedic Surgery

## 2012-09-18 DIAGNOSIS — M4800 Spinal stenosis, site unspecified: Secondary | ICD-10-CM

## 2012-09-20 ENCOUNTER — Other Ambulatory Visit: Payer: Self-pay | Admitting: Specialist

## 2012-09-20 ENCOUNTER — Ambulatory Visit
Admission: RE | Admit: 2012-09-20 | Discharge: 2012-09-20 | Disposition: A | Discharge status: Home / Self Care | Payer: Medicare Other | Source: Ambulatory Visit | Attending: Orthopedic Surgery | Admitting: Orthopedic Surgery

## 2012-09-20 ENCOUNTER — Other Ambulatory Visit: Payer: Self-pay | Admitting: Orthopedic Surgery

## 2012-09-20 VITALS — BP 135/50 | HR 51

## 2012-09-20 DIAGNOSIS — M5126 Other intervertebral disc displacement, lumbar region: Secondary | ICD-10-CM | POA: Diagnosis not present

## 2012-09-20 DIAGNOSIS — M4800 Spinal stenosis, site unspecified: Secondary | ICD-10-CM

## 2012-09-20 DIAGNOSIS — M47817 Spondylosis without myelopathy or radiculopathy, lumbosacral region: Secondary | ICD-10-CM | POA: Diagnosis not present

## 2012-09-20 IMAGING — CR DG MYELOGRAM LUMBAR
6 of 20 series · 6 of 20 positions shown · IV contrast (omnipaque)
Comparison: none

CLINICAL DATA: Back pain

MYELOGRAM LUMBAR
TECHNIQUE: The procedure, risks, benefits, and alternatives were
explained to the patient. The patient understands and consents.
Under fluoroscopic guidance, a 22 gauge spinal needle was placed in
the CSF space via right L5-S1 approach. 20 mL of Omnipaque 180 was
injected.

[[hospital]]
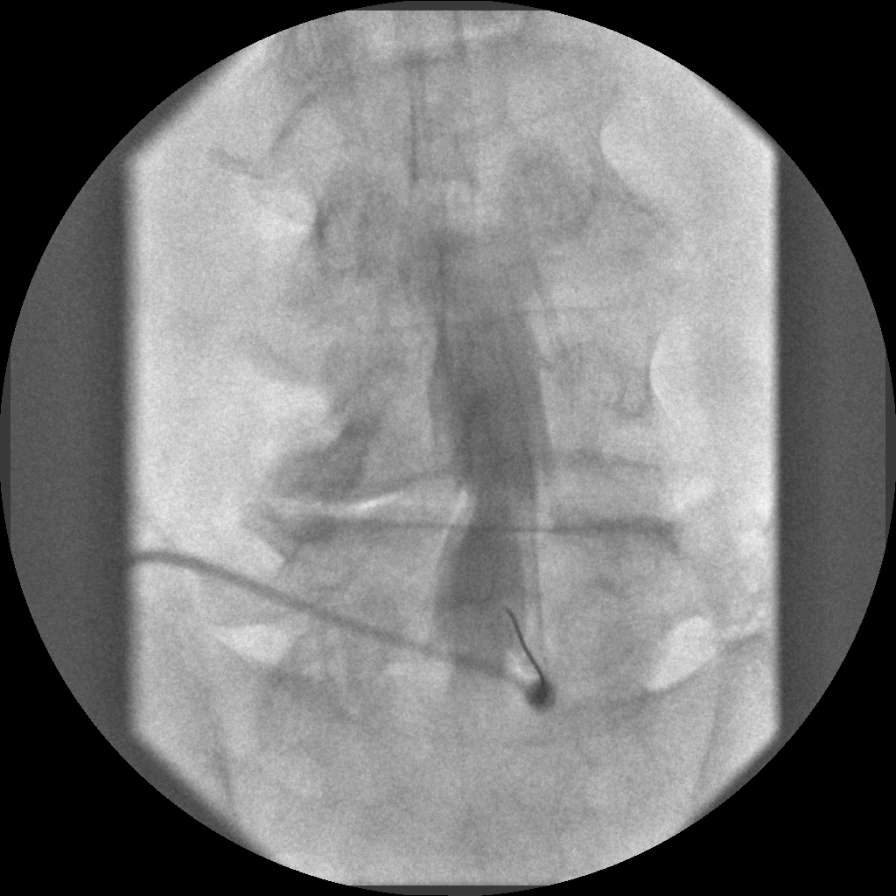

[myelogram  white (1 of 5)]
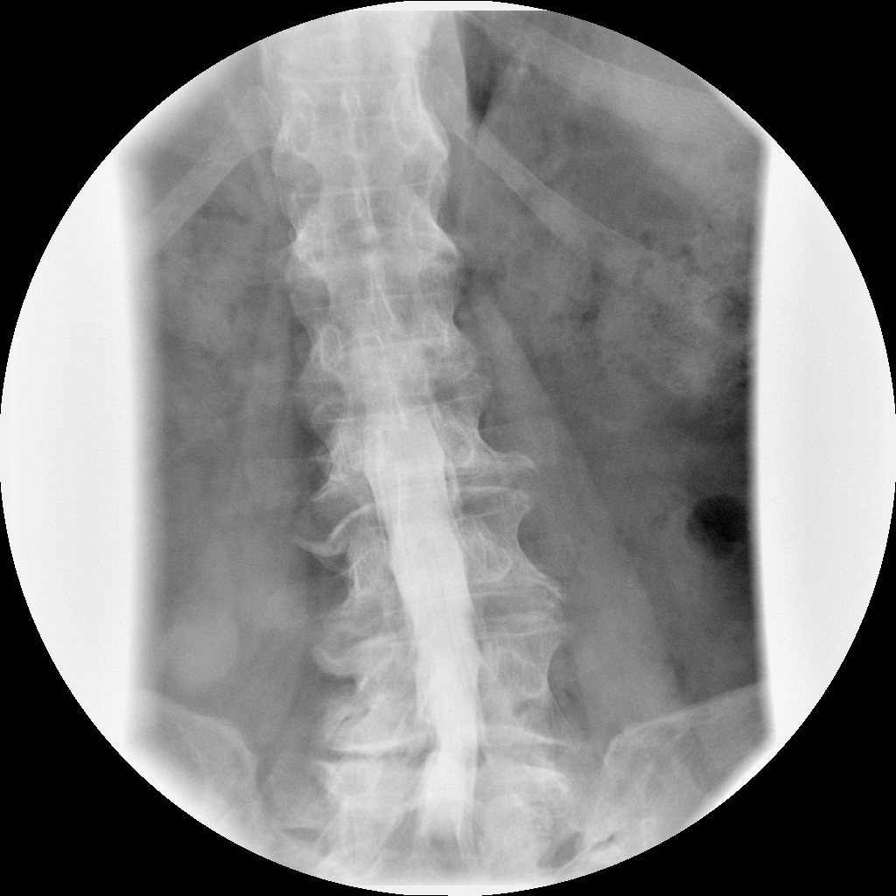

[myelogram  white (2 of 5)]
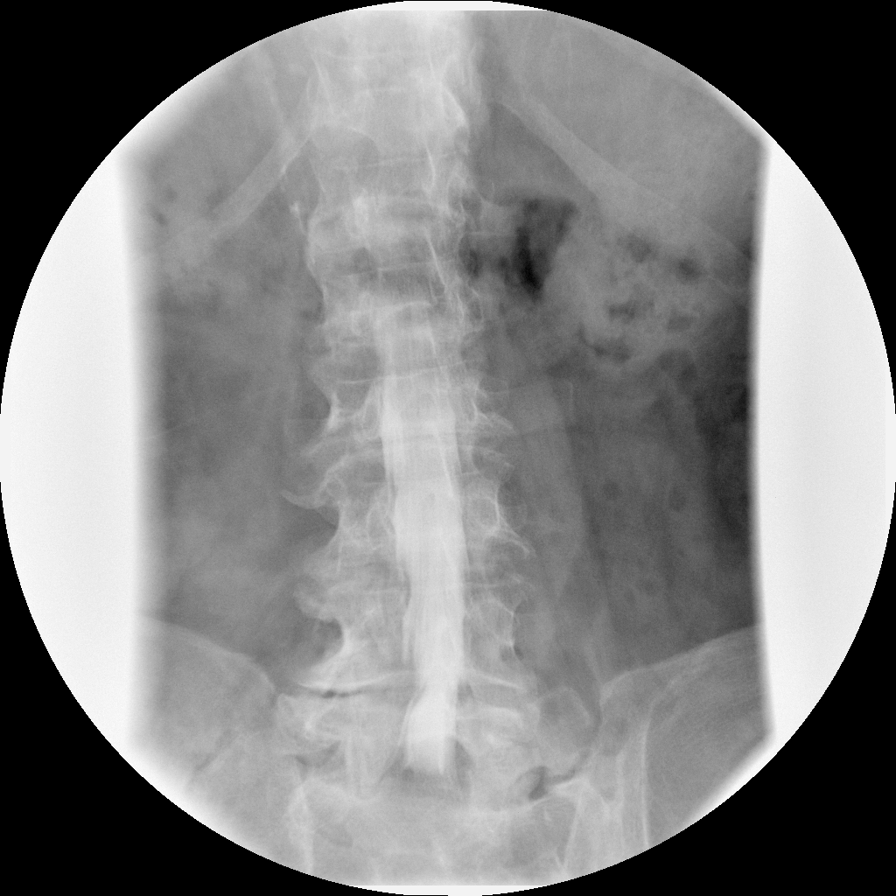

[myelogram  white (3 of 5)]
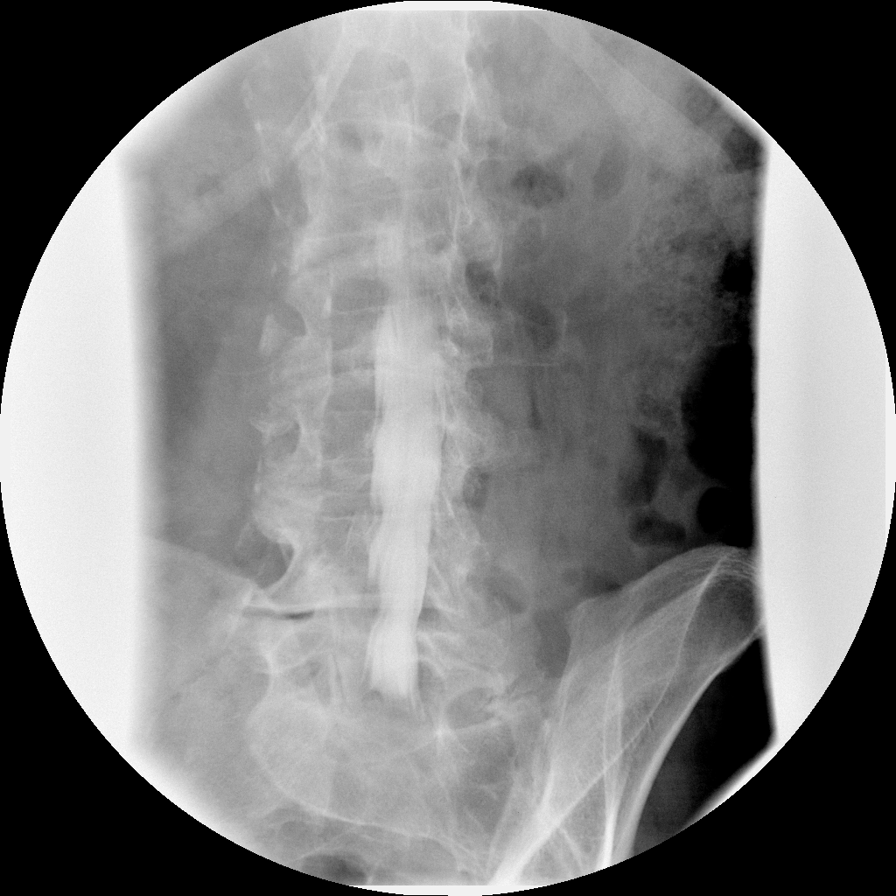

[myelogram  white (4 of 5)]
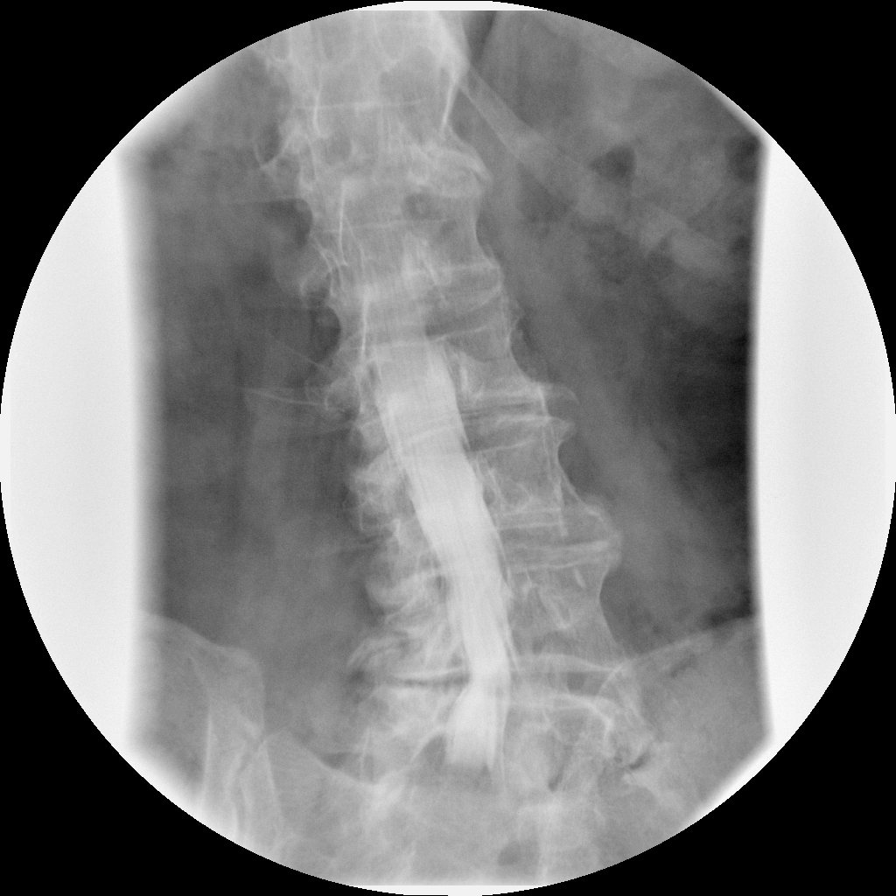

[myelogram  white (5 of 5)]
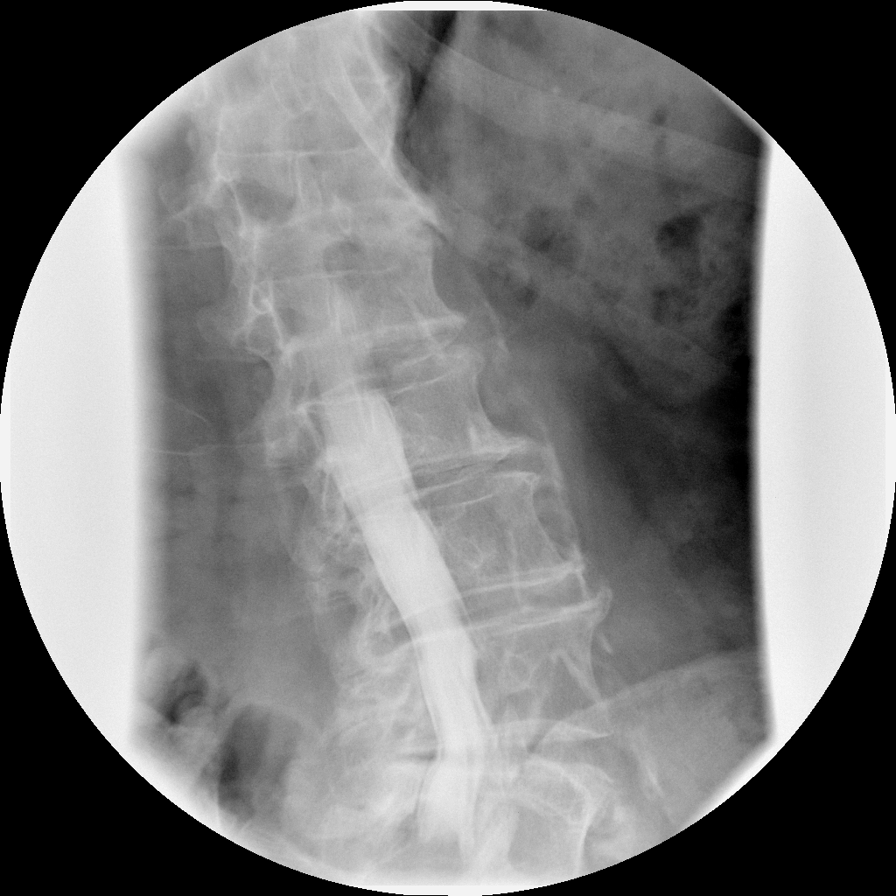

[6 of 20 positions shown; findings below may reference images not displayed]

FINDINGS: There are six non-rib bearing lumbar vertebral bodies.
Based on the office nodes describing a left lateral recess
narrowing at L4-5, the most inferior level is labeled L5 resulting
in a transitional vertebral body between L1 and the lowest rib
bearing vertebral body.  Correlation with the MRI is recommended.

There is no vertebral compression deformity.  Dextroscoliosis
occurs at L4-5.  On the lateral view, there is approximately 5 mm
retrolisthesis L3 upon L4.  Otherwise anatomic alignment.

L1-2:  Unremarkable level

L2-3:  Moderate disc narrowing without herniation.

L3-4:  Moderate narrowing without herniation

L4-5:  Severe narrowing with vacuum.  Asymmetric narrowing of the
left side of the disc with left lateral recess stenosis.

L5-S1:  Unremarkable.

Flexion and extension views including lateral side bending views
demonstrate no abnormal motion to suggest instability.

Complications: None.

Fluoroscopy Time: 50 seconds.
IMPRESSION: Left lateral recess narrowing at L4-5.  Less prominent degenerative
changes at the other levels.  Transitional anatomy is noted.  The
labeling scheme is based on the office notes.  The prior MRI is not
available for direct comparison.

## 2012-09-20 IMAGING — CT CT L SPINE W/ CM
4 of 9 series · 13 of 33 positions shown, 15 images · non-contrast
Comparison: None

CLINICAL DATA: Back pain

CT MYELOGRAPHY LUMBAR SPINE
TECHNIQUE: CT imaging of the lumbar spine was performed after
intrathecal contrast administration.  Multiplanar CT image
reconstructions were also generated.

[Series 2: l spine bone · axial · 0.27mm/px · z∈[-37,+40]mm · 2 of 93 slices shown, 3 images]
[im 31/93  soft-tissue]
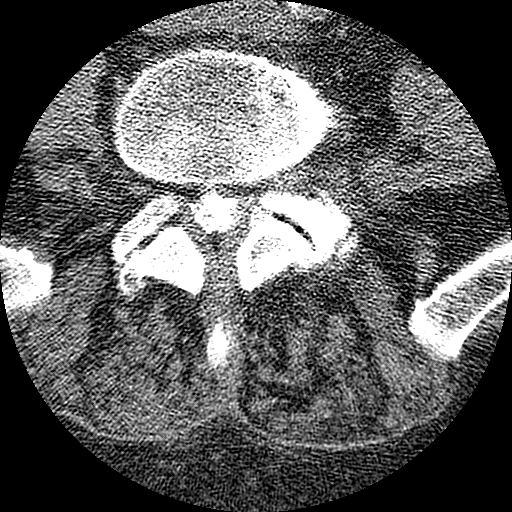
[im 31/93  bone]
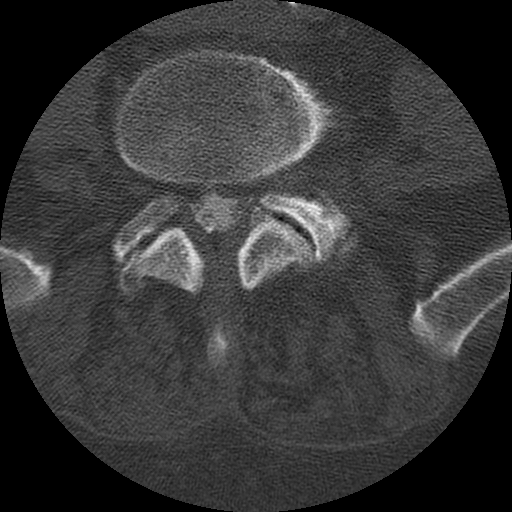
[im 62/93  bone]
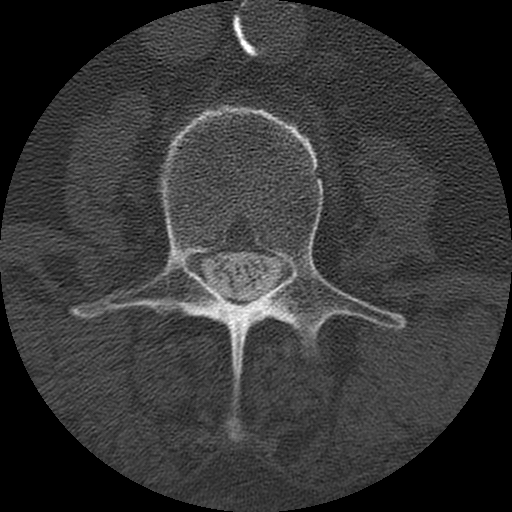

[Series 3: l spine soft · axial · 0.27mm/px · z∈[-55,+60]mm · 3 of 93 slices shown]
[im 24/93  soft-tissue]
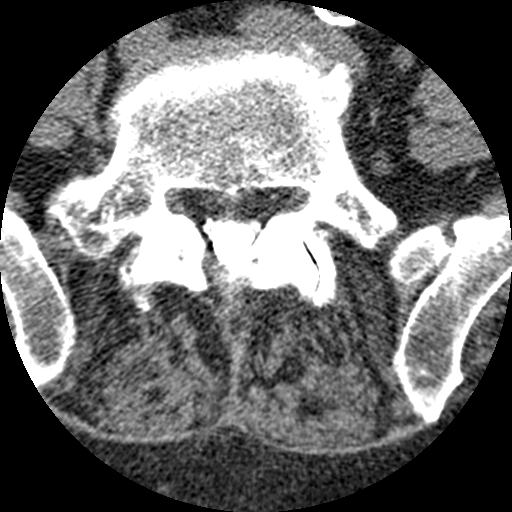
[im 47/93  soft-tissue]
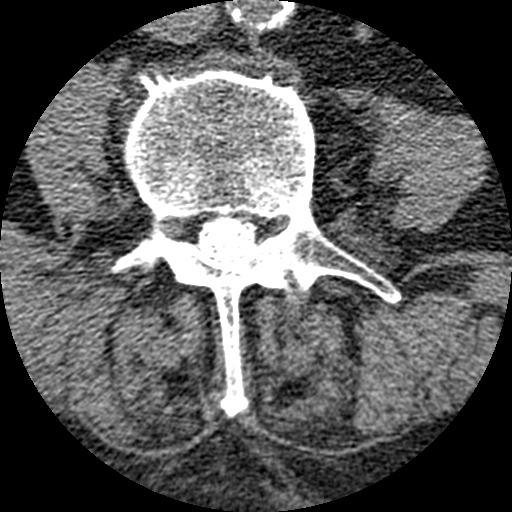
[im 70/93  soft-tissue]
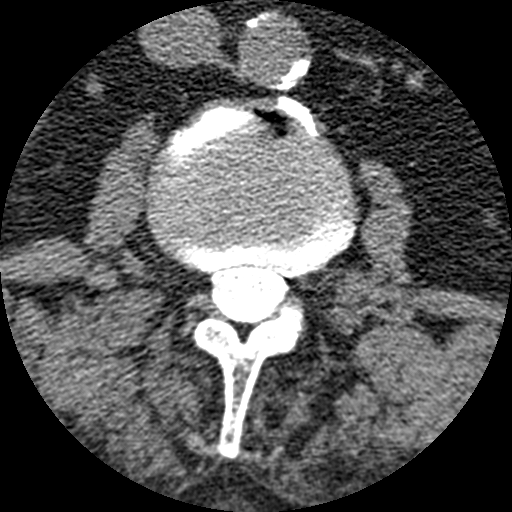

[Series 400: coronal · coronal · 0.46mm/px · 3 of 58 slices shown]
[im 12/58  bone]
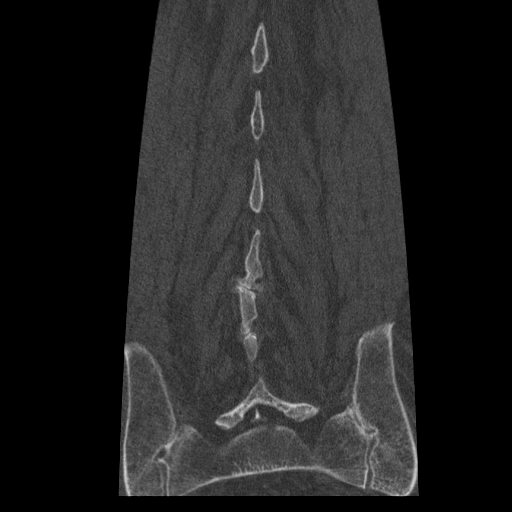
[im 23/58  bone]
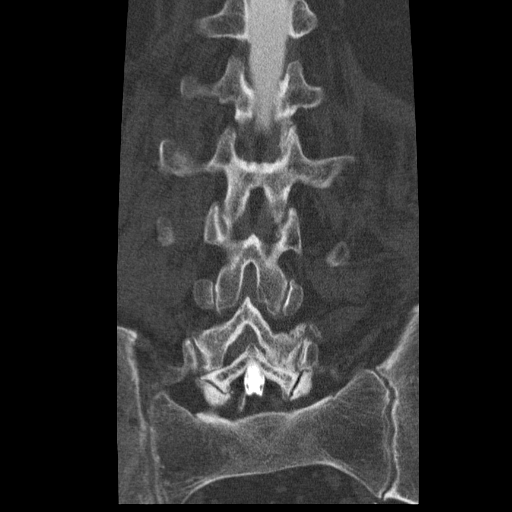
[im 35/58  bone]
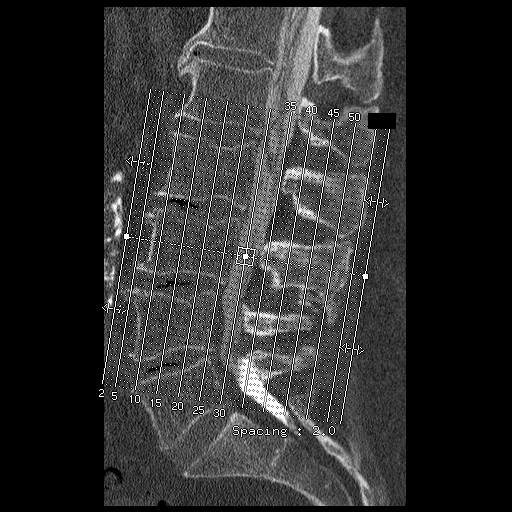

[Series 401: sagittal · sagittal · 0.46mm/px · 5 of 45 slices shown, 6 images]
[im 15/45  bone]
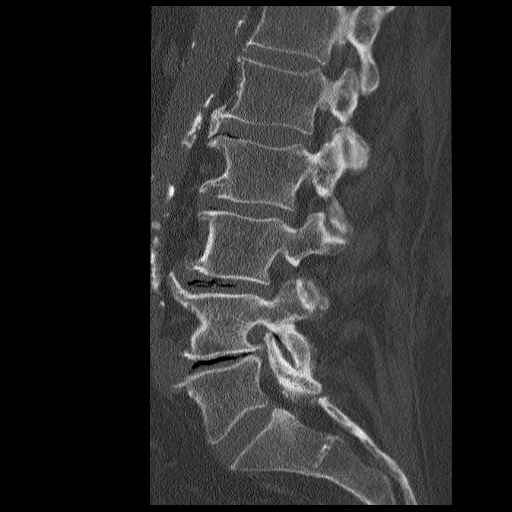
[im 19/45  bone]
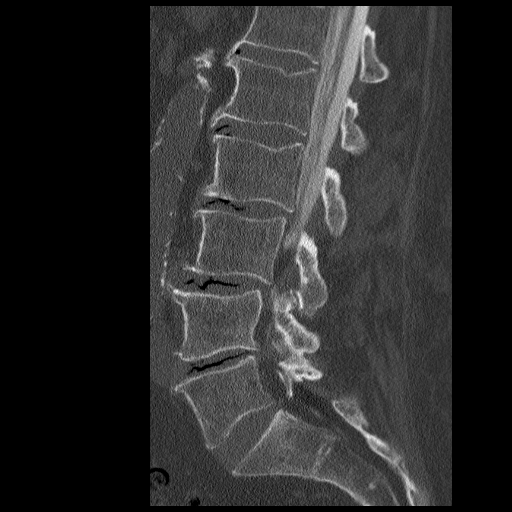
[im 23/45  soft-tissue]
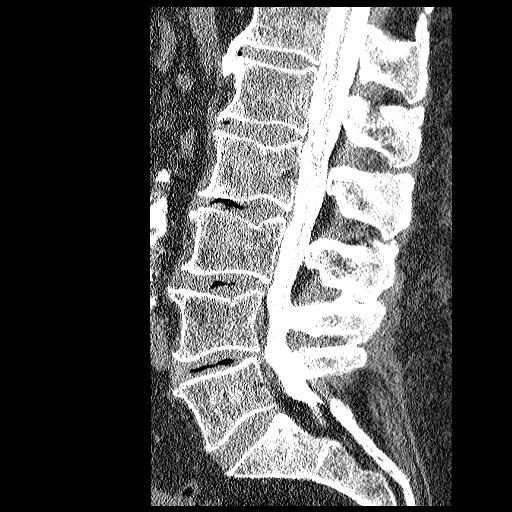
[im 23/45  bone]
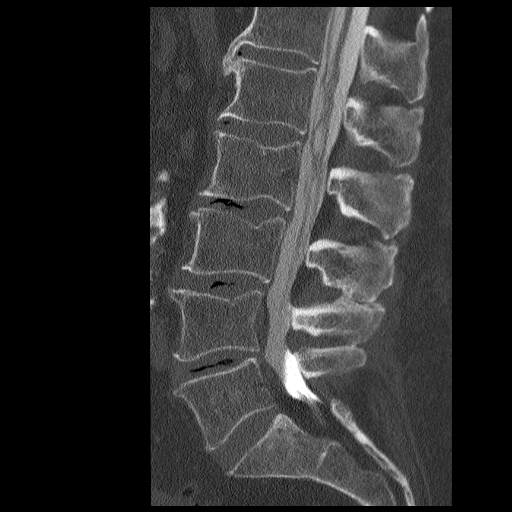
[im 26/45  bone]
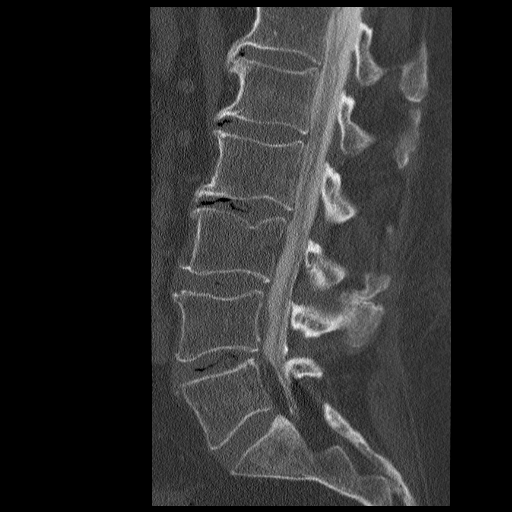
[im 30/45  bone]
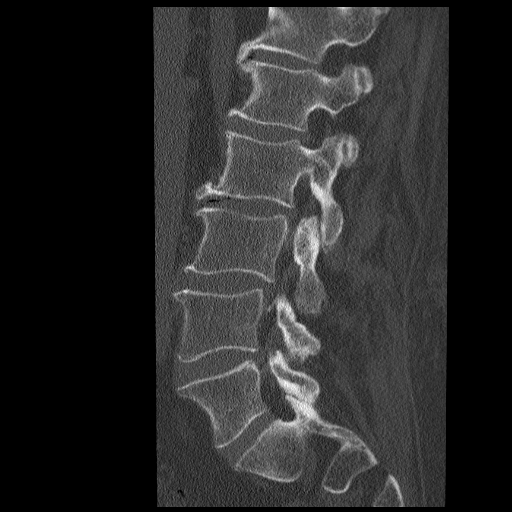

[13 of 33 positions shown; findings below may reference images not displayed]

FINDINGS: Radiologist injection.

There is transitional anatomy.  There are six non-rib bearing
vertebral bodies.  Based on the office nodes describing a left
lateral recess narrowing at L4-5, the most inferior disc is labeled
L5-S1.  This would allow for an additional vertebral body between
L1 and the lowest rib bearing vertebral body.  Comparison with the
prior MRI is recommended.

The conus medullaris terminates at T12-L1.  No vertebral
compression deformity.  5 mm retrolisthesis L3 upon L4.  Mild
dextroscoliosis at L4-5.

T12-L1:  Unremarkable.

L1-L2:  Unremarkable.

L2-L3: Left  lateral disc bulges present.  Central canal is widely
patent.  Moderate narrowing of the disc with vacuum.  Foramina
patent.

L3-4:  Moderate concentric disc bulge asymmetric to the left
extraforaminal and lateral locations.  There is also
retrolisthesis.  Mild narrowing of the disc with vacuum.  Central
canal is patent.  Foramina patent.

L4-5:  Severe left facet arthropathy and moderate concentric disc
bulge result in mild central stenosis and significant left lateral
recess narrowing with nerve root encroachment.  Mild left foraminal
narrowing secondary to disc narrowing, facet osteophytes, and disc
osteophytes.  Right foramen is patent.  Mild narrowing of the disc
with vacuum.

L5 S1:  Moderate right-sided facet arthropathy.  Left
extraforaminal disc osteophytes are present.  The right side of L5
is sacralized.  No disc herniation.  Lateral recesses and central
canal are patent.  Foramina patent.
IMPRESSION: There is transitional anatomy as described.  Lowest disc is labeled
L5-S1 based on the patient's office notes.  Allowing for this, the
right side of L5 is sacralized.

Left lateral recess narrowing and left foraminal narrowing at L4-5.
There is severe facet arthropathy and a concentric disc bulge at
this level.

Less prominent degenerative changes at the other levels without
impingement.

## 2012-09-20 MED ORDER — DIAZEPAM 5 MG PO TABS
5.0000 mg | ORAL_TABLET | Freq: Once | ORAL | Status: AC
  Administered 2012-09-20: 5 mg via ORAL

## 2012-09-20 MED ORDER — IOHEXOL 180 MG/ML  SOLN
20.0000 mL | Freq: Once | INTRAMUSCULAR | Status: AC | PRN
  Administered 2012-09-20: 20 mL via INTRATHECAL

## 2012-09-20 NOTE — Progress Notes (Signed)
Wife notified via phone of 1230 discharge time.  [email or web address]

## 2012-09-24 DIAGNOSIS — M47817 Spondylosis without myelopathy or radiculopathy, lumbosacral region: Secondary | ICD-10-CM | POA: Diagnosis not present

## 2012-09-24 DIAGNOSIS — M48061 Spinal stenosis, lumbar region without neurogenic claudication: Secondary | ICD-10-CM | POA: Diagnosis not present

## 2012-10-16 DIAGNOSIS — M47817 Spondylosis without myelopathy or radiculopathy, lumbosacral region: Secondary | ICD-10-CM | POA: Diagnosis not present

## 2012-11-01 DIAGNOSIS — M47817 Spondylosis without myelopathy or radiculopathy, lumbosacral region: Secondary | ICD-10-CM | POA: Diagnosis not present

## 2012-11-20 DIAGNOSIS — IMO0002 Reserved for concepts with insufficient information to code with codable children: Secondary | ICD-10-CM | POA: Diagnosis not present

## 2012-11-20 DIAGNOSIS — G5620 Lesion of ulnar nerve, unspecified upper limb: Secondary | ICD-10-CM | POA: Diagnosis not present

## 2012-11-20 DIAGNOSIS — M48061 Spinal stenosis, lumbar region without neurogenic claudication: Secondary | ICD-10-CM | POA: Diagnosis not present

## 2012-11-25 DIAGNOSIS — Z23 Encounter for immunization: Secondary | ICD-10-CM | POA: Diagnosis not present

## 2012-12-20 ENCOUNTER — Other Ambulatory Visit: Payer: Self-pay

## 2012-12-23 ENCOUNTER — Encounter: Payer: Self-pay | Admitting: Internal Medicine

## 2012-12-23 DIAGNOSIS — N138 Other obstructive and reflux uropathy: Secondary | ICD-10-CM | POA: Insufficient documentation

## 2012-12-23 DIAGNOSIS — F988 Other specified behavioral and emotional disorders with onset usually occurring in childhood and adolescence: Secondary | ICD-10-CM | POA: Insufficient documentation

## 2012-12-23 DIAGNOSIS — R7309 Other abnormal glucose: Secondary | ICD-10-CM | POA: Insufficient documentation

## 2012-12-23 DIAGNOSIS — E559 Vitamin D deficiency, unspecified: Secondary | ICD-10-CM | POA: Insufficient documentation

## 2012-12-23 DIAGNOSIS — N401 Enlarged prostate with lower urinary tract symptoms: Secondary | ICD-10-CM | POA: Insufficient documentation

## 2012-12-24 ENCOUNTER — Encounter: Payer: Self-pay | Admitting: Internal Medicine

## 2012-12-24 ENCOUNTER — Ambulatory Visit: Payer: Medicare Other | Admitting: Internal Medicine

## 2012-12-24 ENCOUNTER — Other Ambulatory Visit: Payer: Self-pay | Admitting: Internal Medicine

## 2012-12-24 VITALS — BP 126/72 | HR 48 | Temp 96.9°F | Resp 16 | Ht 68.5 in | Wt 207.0 lb

## 2012-12-24 DIAGNOSIS — Z79899 Other long term (current) drug therapy: Secondary | ICD-10-CM

## 2012-12-24 DIAGNOSIS — R7309 Other abnormal glucose: Secondary | ICD-10-CM

## 2012-12-24 DIAGNOSIS — E559 Vitamin D deficiency, unspecified: Secondary | ICD-10-CM

## 2012-12-24 DIAGNOSIS — Z125 Encounter for screening for malignant neoplasm of prostate: Secondary | ICD-10-CM

## 2012-12-24 DIAGNOSIS — Z0000 Encounter for general adult medical examination without abnormal findings: Secondary | ICD-10-CM | POA: Insufficient documentation

## 2012-12-24 DIAGNOSIS — M545 Low back pain, unspecified: Secondary | ICD-10-CM

## 2012-12-24 DIAGNOSIS — I10 Essential (primary) hypertension: Secondary | ICD-10-CM | POA: Insufficient documentation

## 2012-12-24 DIAGNOSIS — Z1212 Encounter for screening for malignant neoplasm of rectum: Secondary | ICD-10-CM

## 2012-12-24 DIAGNOSIS — E782 Mixed hyperlipidemia: Secondary | ICD-10-CM

## 2012-12-24 DIAGNOSIS — F5105 Insomnia due to other mental disorder: Secondary | ICD-10-CM

## 2012-12-24 LAB — BASIC METABOLIC PANEL WITH GFR
BUN: 20 mg/dL (ref 6–23)
CO2: 31 mEq/L (ref 19–32)
Calcium: 9.8 mg/dL (ref 8.4–10.5)
Chloride: 102 mEq/L (ref 96–112)
Creat: 1.07 mg/dL (ref 0.50–1.35)
GFR, Est African American: 76 mL/min
GFR, Est Non African American: 66 mL/min
Glucose, Bld: 89 mg/dL (ref 70–99)
Potassium: 5.7 mEq/L — ABNORMAL HIGH (ref 3.5–5.3)
Sodium: 141 mEq/L (ref 135–145)

## 2012-12-24 LAB — HEPATIC FUNCTION PANEL
ALT: 40 U/L (ref 0–53)
AST: 45 U/L — ABNORMAL HIGH (ref 0–37)
Albumin: 4.3 g/dL (ref 3.5–5.2)
Alkaline Phosphatase: 64 U/L (ref 39–117)
Bilirubin, Direct: 0.3 mg/dL (ref 0.0–0.3)
Indirect Bilirubin: 0.7 mg/dL (ref 0.0–0.9)
Total Bilirubin: 1 mg/dL (ref 0.3–1.2)
Total Protein: 6.8 g/dL (ref 6.0–8.3)

## 2012-12-24 LAB — TSH: TSH: 2.868 u[IU]/mL (ref 0.350–4.500)

## 2012-12-24 LAB — CBC WITH DIFFERENTIAL/PLATELET
Basophils Absolute: 0 10*3/uL (ref 0.0–0.1)
Basophils Relative: 1 % (ref 0–1)
Eosinophils Absolute: 0.1 10*3/uL (ref 0.0–0.7)
Eosinophils Relative: 1 % (ref 0–5)
HCT: 48.7 % (ref 39.0–52.0)
Hemoglobin: 17.2 g/dL — ABNORMAL HIGH (ref 13.0–17.0)
Lymphocytes Relative: 36 % (ref 12–46)
Lymphs Abs: 2.5 10*3/uL (ref 0.7–4.0)
MCH: 36.4 pg — ABNORMAL HIGH (ref 26.0–34.0)
MCHC: 35.3 g/dL (ref 30.0–36.0)
MCV: 103.2 fL — ABNORMAL HIGH (ref 78.0–100.0)
Monocytes Absolute: 1.1 10*3/uL — ABNORMAL HIGH (ref 0.1–1.0)
Monocytes Relative: 16 % — ABNORMAL HIGH (ref 3–12)
Neutro Abs: 3.2 10*3/uL (ref 1.7–7.7)
Neutrophils Relative %: 46 % (ref 43–77)
Platelets: 231 10*3/uL (ref 150–400)
RBC: 4.72 MIL/uL (ref 4.22–5.81)
RDW: 13.3 % (ref 11.5–15.5)
WBC: 6.9 10*3/uL (ref 4.0–10.5)

## 2012-12-24 LAB — LIPID PANEL
Cholesterol: 140 mg/dL (ref 0–200)
HDL: 33 mg/dL — ABNORMAL LOW (ref >39)
LDL Cholesterol: 76 mg/dL (ref 0–99)
Total CHOL/HDL Ratio: 4.2 Ratio
Triglycerides: 154 mg/dL — ABNORMAL HIGH (ref <150)
VLDL: 31 mg/dL (ref 0–40)

## 2012-12-24 LAB — MAGNESIUM: Magnesium: 2 mg/dL (ref 1.5–2.5)

## 2012-12-24 MED ORDER — MIRTAZAPINE 15 MG PO TABS: ORAL_TABLET | ORAL | Status: DC

## 2012-12-24 MED ORDER — HYDROMORPHONE HCL 4 MG PO TABS: 4.0000 mg | ORAL_TABLET | ORAL | Status: DC | PRN

## 2012-12-24 NOTE — Progress Notes (Signed)
Patient ID: Michael Ware, male   DOB: Mar 21, 1931, 79 y.o.   MRN: [id number]  Annual Screening Comprehensive Examination  This very nice 80 yo MWM presents for complete physical.  Patient has been followed for HTN, Prediabetes, Hyperlipidemia, and vitamin D Deficiency.   Patient's BP has been controlled at home. Patient denies any cardiac Symptoms as chest pain, palpitations, shortness of breath, dizziness or ankle swelling.   Patient's hyperlipidemia is controlled with diet and medications. Patient denies myalgias or other medication SE's. Last cholesterol last visit was 123 and LDL 71 at goal in August.     Patient has prediabetes/insulin resistance with last A1c 5.3% in August(A1c was 5.8% in 2012).  Patient denies reactive hypoglycemic symptoms, visual blurring, diabetic polys, or paresthesias.     Other problems are chronic Low Back Pain followed by Dr Ethelene Hal with last L4/L5  EDSI in September 2014. He also has Lt ulnar N. entrapment and Dr Amanda Pea recommended surgery. Patient saw Dr Danielle Dess for 2sd opinion and decided to defer back and ulnar n. Surgery.He states he suffers with LBP daily and has an old Rx Dilaudid tabs which he occasionally resorts to for pain relief.   Finally, patient has history of Vitamin D Deficiency with last vitamin D of 92 in August.     Current outpatient prescriptions: aspirin 81 MG tablet, Take 81 mg by mouth daily Atenolol (TENORMIN) 100 MG tablet, Take 100 mg by mouth daily B Complex-C (B-COMPLEX WITH VITAMIN C) tablet, Take 1 tablet by mouth daily CRANBERRY EXTRACT PO, Take 1 capsule by mouth daily.  diazepam (VALIUM) 10 MG tablet, Take 10 mg by mouth 3 (three) times daily as needed for anxiety  Ergocalciferol (VITAMIN D2) 2000 UNITS TABS, Take 5,000 capsules by mouth daily. Takes 1 cap daily   Garlic 10 MG CAPS, Take by mouth daily meloxicam (MOBIC) 15 MG tablet, Take 15 mg by mouth as needed for pain methocarbamol (ROBAXIN) 750 MG tablet, Take 750 mg by  mouth 3 (three) times daily as needed for muscle spasms pravastatin (PRAVACHOL) 40 MG tablet, Take 40 mg by mouth daily Saw Palmetto, Serenoa repens, (SAW PALMETTO PO), Take 1 capsule by mouth daily Selenium (SELENIMIN PO), Take 1 capsule by mouth daily Specialty Vitamins Products (MAGNESIUM, AMINO ACID CHELATE,) 133 MG tablet, Take 1 tablet by mouth daily VITAMIN B1-B12 PO, Take 1 capsule by mouth daily Zinc 50 MG CAPS, Take 1 capsule by mouth daily  Allergies  Allergen Reactions  . Trazamine [Trazodone & Diet Manage Prod]     Dry mouth    Past Medical History  Diagnosis Date  . Diverticulosis   . Hypertension   . Hyperlipidemia   . ADD (attention deficit disorder)   . Vitamin D deficiency   . Elevated hemoglobin A1c   . BPH (benign prostatic hypertrophy) with urinary obstruction     Past Surgical History  Procedure Laterality Date  . Rotator cuff repair      bilateral  . Rectal fissure procedure  1970's  . Tonsillectomy    . Anal fissure repair  1980    Family History  Problem Relation Age of Onset  . Colon cancer Neg Hx   . Heart disease Mother   . Heart disease Father     History   Social History  . Marital Status: Married    Spouse Name: N/A    Number of Children: N/A  . Years of Education: N/A   Occupational History  . Not on file.  Social History Main Topics  . Smoking status: Former Smoker    Quit date: 02/06/1998  . Smokeless tobacco: Former Neurosurgeon  . Alcohol Use: 1 - 1.5 oz/week    2-3 drink(s) per week     Comment: couple drinks weekly  . Drug Use: No  . Sexual Activity: Not on file   . Not on file     ROS Constitutional: Denies fever, chills, weight loss/gain, headaches, insomnia, fatigue, night sweats, and change in appetite. Eyes: Denies redness, blurred vision, diplopia, discharge, itchy, watery eyes.  ENT: Denies discharge, congestion, post nasal drip, epistaxis, sore throat, earache, hearing loss, dental pain, Tinnitus, Vertigo,  Sinus pain, snoring.  Cardio: Denies chest pain, palpitations, irregular heartbeat, syncope, dyspnea, diaphoresis, orthopnea, PND, claudication, edema Respiratory: denies cough, dyspnea, DOE, pleurisy, hoarseness, laryngitis, wheezing.  Gastrointestinal: Denies dysphagia, heartburn, reflux, water brash, pain, cramps, nausea, vomiting, bloating, diarrhea, constipation, hematemesis, melena, hematochezia, jaundice, hemorrhoids Genitourinary: Denies dysuria, frequency, urgency, nocturia, hesitancy, discharge, hematuria, flank pain Musculoskeletal: + as above. Skin: Denies puritis, rash, hives, warts, acne, eczema, changing in skin lesion Neuro: Weakness, tremor, incoordination, spasms, paresthesia, pain Psychiatric: Denies confusion, memory loss, sensory loss Endocrine: Denies change in weight, skin, hair change, nocturia, and paresthesia, diabetic polys, visual blurring, hyper /hypo glycemic episodes.  Heme/Lymph: No excessive bleeding, bruising, enlarged lymph nodes.  Filed Vitals:   12/24/12 1615  BP: 126/72  Pulse: 48  Temp: 96.9 F (36.1 C)  Resp: 16   Body mass index is 31.01 kg/(m^2).  Physical Exam General Appearance: Well nourished, in no apparent distress. Eyes: PERRLA, EOMs, conjunctiva no swelling or erythema, normal fundi and vessels. Sinuses: No frontal/maxillary tenderness ENT/Mouth: EACs patent / TMs  nl. Nares clear without erythema, swelling, mucoid exudates. Oral hygiene is good. No erythema, swelling, or exudate. Tongue normal, non-obstructing. Tonsils not swollen or erythematous. Hearing normal.  Neck: Supple, thyroid normal. No bruits, nodes or JVD. Respiratory: Respiratory effort normal.  BS equal and clear bilateral without rales, rhonci, wheezing or stridor. Cardio: Heart sounds are normal with regular rate and rhythm and no murmurs, rubs or gallops. Peripheral pulses are normal and equal bilaterally without edema. No aortic or femoral bruits. Chest: symmetric with  normal excursions and percussion.  Abdomen: Flat, soft, with bowl sounds. Nontender, no guarding, rebound, hernias, masses, or organomegaly.  Lymphatics: Non tender without lymphadenopathy.  Genitourinary: No hernias.Testes nl. DRE - prostate nl for age - smooth & firm w/o nodules. Musculoskeletal: Full ROM all peripheral extremities, joint stability, 5/5 strength, and normal gait. Skin: Warm and dry without rashes, lesions, cyanosis, clubbing or  ecchymosis.  Neuro: Cranial nerves intact, reflexes equal bilaterally. Normal muscle tone, no cerebellar symptoms. Sensation intact.  Pysch: Awake and oriented X 3, normal affect, Insight and Judgment appropriate.   Assessment and Plan  1. Annual Screening Examination 2. Hypertension  3. Hyperlipidemia 4. Pre Diabetes 5. Vitamin D Deficiency 6. Chronic Lumbago  Continue prudent diet as discussed, weight control, regular exercise, and medications. Routine screening labs and tests as requested with regular follow-up as recommended.

## 2012-12-24 NOTE — Patient Instructions (Signed)

## 2012-12-25 DIAGNOSIS — I10 Essential (primary) hypertension: Secondary | ICD-10-CM | POA: Diagnosis not present

## 2012-12-25 DIAGNOSIS — Z1212 Encounter for screening for malignant neoplasm of rectum: Secondary | ICD-10-CM | POA: Diagnosis not present

## 2012-12-25 LAB — PSA: PSA: 0.33 ng/mL (ref <=4.00)

## 2012-12-25 LAB — INSULIN, FASTING: Insulin fasting, serum: 20 u[IU]/mL (ref 3–28)

## 2012-12-25 LAB — VITAMIN D 25 HYDROXY (VIT D DEFICIENCY, FRACTURES): Vit D, 25-Hydroxy: 87 ng/mL (ref 30–89)

## 2012-12-25 LAB — URINALYSIS, MICROSCOPIC ONLY
Bacteria, UA: NONE SEEN
Casts: NONE SEEN
Crystals: NONE SEEN
Squamous Epithelial / LPF: NONE SEEN

## 2012-12-25 LAB — HEMOGLOBIN A1C
Hgb A1c MFr Bld: 5.3 % (ref <5.7)
Mean Plasma Glucose: 105 mg/dL (ref <117)

## 2012-12-25 LAB — MICROALBUMIN / CREATININE URINE RATIO
Creatinine, Urine: 160 mg/dL
Microalb Creat Ratio: 4 mg/g (ref 0.0–30.0)
Microalb, Ur: 0.64 mg/dL (ref 0.00–1.89)

## 2012-12-27 ENCOUNTER — Encounter: Payer: Self-pay | Admitting: Internal Medicine

## 2013-01-15 ENCOUNTER — Other Ambulatory Visit: Payer: Medicare Other | Admitting: Internal Medicine

## 2013-01-15 DIAGNOSIS — Z1212 Encounter for screening for malignant neoplasm of rectum: Secondary | ICD-10-CM

## 2013-01-15 LAB — POC HEMOCCULT BLD/STL (HOME/3-CARD/SCREEN)
Card #2 Fecal Occult Blod, POC: NEGATIVE
Card #3 Fecal Occult Blood, POC: NEGATIVE
Fecal Occult Blood, POC: NEGATIVE

## 2013-01-19 ENCOUNTER — Inpatient Hospital Stay (HOSPITAL_COMMUNITY)
Admission: EM | Admit: 2013-01-19 | Discharge: 2013-01-21 | DRG: 379 | Disposition: A | Discharge status: Home / Self Care | Payer: Medicare Other | Attending: Internal Medicine | Admitting: Internal Medicine

## 2013-01-19 ENCOUNTER — Encounter (HOSPITAL_COMMUNITY): Payer: Self-pay | Admitting: Emergency Medicine

## 2013-01-19 DIAGNOSIS — F988 Other specified behavioral and emotional disorders with onset usually occurring in childhood and adolescence: Secondary | ICD-10-CM | POA: Diagnosis not present

## 2013-01-19 DIAGNOSIS — E782 Mixed hyperlipidemia: Secondary | ICD-10-CM | POA: Diagnosis not present

## 2013-01-19 DIAGNOSIS — N401 Enlarged prostate with lower urinary tract symptoms: Secondary | ICD-10-CM | POA: Diagnosis present

## 2013-01-19 DIAGNOSIS — I10 Essential (primary) hypertension: Secondary | ICD-10-CM

## 2013-01-19 DIAGNOSIS — E559 Vitamin D deficiency, unspecified: Secondary | ICD-10-CM | POA: Diagnosis present

## 2013-01-19 DIAGNOSIS — Z7982 Long term (current) use of aspirin: Secondary | ICD-10-CM | POA: Diagnosis not present

## 2013-01-19 DIAGNOSIS — N139 Obstructive and reflux uropathy, unspecified: Secondary | ICD-10-CM | POA: Diagnosis not present

## 2013-01-19 DIAGNOSIS — K922 Gastrointestinal hemorrhage, unspecified: Secondary | ICD-10-CM

## 2013-01-19 DIAGNOSIS — Z8249 Family history of ischemic heart disease and other diseases of the circulatory system: Secondary | ICD-10-CM | POA: Diagnosis not present

## 2013-01-19 DIAGNOSIS — N138 Other obstructive and reflux uropathy: Secondary | ICD-10-CM | POA: Diagnosis present

## 2013-01-19 DIAGNOSIS — R001 Bradycardia, unspecified: Secondary | ICD-10-CM

## 2013-01-19 DIAGNOSIS — Z0000 Encounter for general adult medical examination without abnormal findings: Secondary | ICD-10-CM

## 2013-01-19 DIAGNOSIS — K5900 Constipation, unspecified: Secondary | ICD-10-CM | POA: Diagnosis present

## 2013-01-19 DIAGNOSIS — R197 Diarrhea, unspecified: Secondary | ICD-10-CM | POA: Diagnosis present

## 2013-01-19 DIAGNOSIS — K625 Hemorrhage of anus and rectum: Secondary | ICD-10-CM | POA: Diagnosis not present

## 2013-01-19 DIAGNOSIS — K5730 Diverticulosis of large intestine without perforation or abscess without bleeding: Secondary | ICD-10-CM | POA: Diagnosis not present

## 2013-01-19 DIAGNOSIS — Z87891 Personal history of nicotine dependence: Secondary | ICD-10-CM

## 2013-01-19 DIAGNOSIS — I498 Other specified cardiac arrhythmias: Secondary | ICD-10-CM | POA: Diagnosis not present

## 2013-01-19 DIAGNOSIS — E785 Hyperlipidemia, unspecified: Secondary | ICD-10-CM | POA: Diagnosis present

## 2013-01-19 DIAGNOSIS — K5731 Diverticulosis of large intestine without perforation or abscess with bleeding: Principal | ICD-10-CM | POA: Diagnosis present

## 2013-01-19 DIAGNOSIS — Z8719 Personal history of other diseases of the digestive system: Secondary | ICD-10-CM

## 2013-01-19 DIAGNOSIS — R7309 Other abnormal glucose: Secondary | ICD-10-CM | POA: Diagnosis present

## 2013-01-19 HISTORY — DX: Hemorrhage of anus and rectum: K62.5

## 2013-01-19 HISTORY — DX: Gastrointestinal hemorrhage, unspecified: K92.2

## 2013-01-19 LAB — COMPREHENSIVE METABOLIC PANEL
ALT: 32 U/L (ref 0–53)
AST: 42 U/L — ABNORMAL HIGH (ref 0–37)
Albumin: 3.5 g/dL (ref 3.5–5.2)
Alkaline Phosphatase: 97 U/L (ref 39–117)
BUN: 22 mg/dL (ref 6–23)
CO2: 24 mEq/L (ref 19–32)
Calcium: 8.7 mg/dL (ref 8.4–10.5)
Chloride: 102 mEq/L (ref 96–112)
Creatinine, Ser: 0.89 mg/dL (ref 0.50–1.35)
GFR calc Af Amer: >90 mL/min (ref >90)
GFR calc non Af Amer: 79 mL/min — ABNORMAL LOW (ref >90)
Glucose, Bld: 171 mg/dL — ABNORMAL HIGH (ref 70–99)
Potassium: 3.8 mEq/L (ref 3.5–5.1)
Sodium: 137 mEq/L (ref 135–145)
Total Bilirubin: 0.6 mg/dL (ref 0.3–1.2)
Total Protein: 6.4 g/dL (ref 6.0–8.3)

## 2013-01-19 LAB — HEMOGLOBIN AND HEMATOCRIT, BLOOD
HCT: 42.2 % (ref 39.0–52.0)
Hemoglobin: 14.7 g/dL (ref 13.0–17.0)

## 2013-01-19 LAB — CBC
HCT: 43.4 % (ref 39.0–52.0)
Hemoglobin: 15.7 g/dL (ref 13.0–17.0)
MCH: 36.5 pg — ABNORMAL HIGH (ref 26.0–34.0)
MCHC: 36.2 g/dL — ABNORMAL HIGH (ref 30.0–36.0)
MCV: 100.9 fL — ABNORMAL HIGH (ref 78.0–100.0)
Platelets: 173 10*3/uL (ref 150–400)
RBC: 4.3 MIL/uL (ref 4.22–5.81)
RDW: 13.2 % (ref 11.5–15.5)
WBC: 6.3 10*3/uL (ref 4.0–10.5)

## 2013-01-19 LAB — SAMPLE TO BLOOD BANK

## 2013-01-19 LAB — PROTIME-INR
INR: 1.01 (ref 0.00–1.49)
Prothrombin Time: 13.1 seconds (ref 11.6–15.2)

## 2013-01-19 LAB — APTT: aPTT: 30 seconds (ref 24–37)

## 2013-01-19 MED ORDER — SODIUM CHLORIDE 0.9 % IV SOLN
Freq: Once | INTRAVENOUS | Status: AC
  Administered 2013-01-19: 14:00:00 via INTRAVENOUS

## 2013-01-19 MED ORDER — B COMPLEX-C PO TABS
1.0000 | ORAL_TABLET | Freq: Every morning | ORAL | Status: DC
  Filled 2013-01-19 (×2): qty 1

## 2013-01-19 MED ORDER — ONDANSETRON HCL 4 MG/2ML IJ SOLN: 4.0000 mg | Freq: Four times a day (QID) | INTRAMUSCULAR | Status: DC | PRN

## 2013-01-19 MED ORDER — PANTOPRAZOLE SODIUM 40 MG PO TBEC
40.0000 mg | DELAYED_RELEASE_TABLET | Freq: Every day | ORAL | Status: DC
  Filled 2013-01-19 (×2): qty 1

## 2013-01-19 MED ORDER — SODIUM CHLORIDE 0.9 % IV SOLN
INTRAVENOUS | Status: DC
  Administered 2013-01-19: 18:00:00 via INTRAVENOUS

## 2013-01-19 MED ORDER — DIAZEPAM 5 MG PO TABS
10.0000 mg | ORAL_TABLET | Freq: Three times a day (TID) | ORAL | Status: DC | PRN
Start: 2013-01-19 — End: 2013-01-21
  Administered 2013-01-20 – 2013-01-21 (×2): 10 mg via ORAL
  Filled 2013-01-19 (×3): qty 2

## 2013-01-19 MED ORDER — ACETAMINOPHEN 650 MG RE SUPP: 650.0000 mg | Freq: Four times a day (QID) | RECTAL | Status: DC | PRN

## 2013-01-19 MED ORDER — PANTOPRAZOLE SODIUM 40 MG IV SOLR
40.0000 mg | Freq: Once | INTRAVENOUS | Status: AC
  Administered 2013-01-19: 40 mg via INTRAVENOUS
  Filled 2013-01-19: qty 40

## 2013-01-19 MED ORDER — ZOLPIDEM TARTRATE 5 MG PO TABS
5.0000 mg | ORAL_TABLET | Freq: Every evening | ORAL | Status: DC | PRN
  Administered 2013-01-19 – 2013-01-20 (×2): 5 mg via ORAL
  Filled 2013-01-19 (×2): qty 1

## 2013-01-19 MED ORDER — SODIUM CHLORIDE 0.9 % IV SOLN
INTRAVENOUS | Status: DC
  Administered 2013-01-19: 22:00:00 via INTRAVENOUS

## 2013-01-19 MED ORDER — METHOCARBAMOL 500 MG PO TABS: 750.0000 mg | ORAL_TABLET | Freq: Three times a day (TID) | ORAL | Status: DC | PRN

## 2013-01-19 MED ORDER — VITAMIN D3 25 MCG (1000 UNIT) PO TABS
5000.0000 [IU] | ORAL_TABLET | Freq: Every morning | ORAL | Status: DC
  Filled 2013-01-19 (×2): qty 5

## 2013-01-19 MED ORDER — SIMVASTATIN 20 MG PO TABS
20.0000 mg | ORAL_TABLET | Freq: Every day | ORAL | Status: DC
  Administered 2013-01-20: 20 mg via ORAL
  Filled 2013-01-19 (×2): qty 1

## 2013-01-19 MED ORDER — ACETAMINOPHEN 325 MG PO TABS: 650.0000 mg | ORAL_TABLET | Freq: Four times a day (QID) | ORAL | Status: DC | PRN

## 2013-01-19 MED ORDER — SODIUM CHLORIDE 0.9 % IJ SOLN
3.0000 mL | Freq: Two times a day (BID) | INTRAMUSCULAR | Status: DC
  Administered 2013-01-20: 3 mL via INTRAVENOUS

## 2013-01-19 MED ORDER — HYDRALAZINE HCL 20 MG/ML IJ SOLN: 10.0000 mg | Freq: Four times a day (QID) | INTRAMUSCULAR | Status: DC | PRN

## 2013-01-19 MED ORDER — ONDANSETRON HCL 4 MG PO TABS: 4.0000 mg | ORAL_TABLET | Freq: Four times a day (QID) | ORAL | Status: DC | PRN

## 2013-01-19 NOTE — H&P (Signed)
[facility] History and Physical  Michael Ware AVW:[id number] DOB: 1931/04/23 DOA: 01/19/2013  Referring physician: EDP PCP: Nadean Corwin, MD   Chief Complaint: Rectal bleeding  HPI: Michael Ware is a 79 y.o. male past medical history significant for diverticulosis presents with complaints of rectal bleeding x2 episodes. He states that about 4-5 days ago he had a couple of episodes of diarrhea and so took Imodium, and has had no further stools. About 5 AM today when he woke up. He realized he was wet, his underwear was soaked with dark  Blood. He was not having any pain, it was no associated diarrhea or stools. He cleaned himself up went back to bed and when he woke up at about 9:00 he was again wet and towels/bedding soaked with dark blood. He denies any melena. He takes a baby aspirin, and also was on meloxicam takes it when necessary in the last time he took it was about a week ago. He was seen in the ED and his hemoglobin stable at 15.7, hemodynamically stable. His heart rate was in the 40s and it is noted that he is on atenolol and his office records indictate that this is about baseline for him. Rectal exam per EDP revealed maroon blood clots. He is admitted for further evaluation and management.    Review of Systems The patient denies anorexia, fever, weight loss, vision loss, decreased hearing, hoarseness, chest pain, syncope, dyspnea on exertion, peripheral edema, balance deficits, hemoptysis, abdominal pain, melena, hematochezia, severe indigestion/heartburn, hematuria, incontinence, genital sores, muscle weakness, suspicious skin lesions, transient blindness, difficulty walking, depression, unusual weight change   Past Medical History  Diagnosis Date  . Diverticulosis   . Hypertension   . Hyperlipidemia   . Vitamin D deficiency   . Elevated hemoglobin A1c   . BPH (benign prostatic hypertrophy) with urinary obstruction    Past Surgical History  Procedure  Laterality Date  . Rotator cuff repair      bilateral  . Rectal fissure procedure  1970's  . Tonsillectomy    . Anal fissure repair  1980   Social History:  reports that he quit smoking about 14 years ago. He has quit using smokeless tobacco. He reports that he drinks about 1.0 ounces of alcohol per week. He reports that he does not use illicit drugs.  Allergies  Allergen Reactions  . Trazamine [Trazodone & Diet Manage Prod]     Dry mouth    Family History  Problem Relation Age of Onset  . Colon cancer Neg Hx   . Heart disease Mother   . Heart disease Father      Prior to Admission medications   Medication Sig Start Date End Date Taking? Authorizing Provider  aspirin 81 MG tablet Take 81 mg by mouth every morning.    Yes Historical Provider, MD  atenolol (TENORMIN) 100 MG tablet Take 100 mg by mouth every morning.    Yes Historical Provider, MD  B Complex-C (B-COMPLEX WITH VITAMIN C) tablet Take 1 tablet by mouth every morning.    Yes Historical Provider, MD  Cholecalciferol (VITAMIN D3) 5000 UNITS CAPS Take 1 capsule by mouth every morning.   Yes Historical Provider, MD  CRANBERRY EXTRACT PO Take 1 capsule by mouth every morning.    Yes Historical Provider, MD  diazepam (VALIUM) 10 MG tablet Take 10 mg by mouth 3 (three) times daily as needed for anxiety.   Yes Historical Provider, MD  Garlic 10 MG CAPS Take 1 capsule  by mouth every morning.    Yes Historical Provider, MD  magnesium gluconate (MAGONATE) 500 MG tablet Take 500 mg by mouth every morning.   Yes Historical Provider, MD  meloxicam (MOBIC) 15 MG tablet Take 15 mg by mouth as needed for pain.   Yes Historical Provider, MD  methocarbamol (ROBAXIN) 750 MG tablet Take 750 mg by mouth 3 (three) times daily as needed for muscle spasms.   Yes Historical Provider, MD  pravastatin (PRAVACHOL) 40 MG tablet Take 40 mg by mouth every morning.    Yes Historical Provider, MD  Saw Palmetto, Serenoa repens, (SAW PALMETTO PO) Take 1  capsule by mouth every morning.    Yes Historical Provider, MD  Selenium (SELENIMIN PO) Take 1 capsule by mouth every morning.    Yes Historical Provider, MD  VITAMIN B1-B12 PO Take 1 capsule by mouth every morning.    Yes Historical Provider, MD  Zinc 50 MG CAPS Take 1 capsule by mouth every morning.    Yes Historical Provider, MD   Physical Exam: Filed Vitals:   01/19/13 1721  BP: 196/76  Pulse: 49  Temp: 97.4 F (36.3 C)  Resp: 18    BP 196/76  Pulse 49  Temp(Src) 97.4 F (36.3 C) (Oral)  Resp 18  Ht 5\' 10"  (1.778 m)  Wt 96.163 kg (212 lb)  BMI 30.42 kg/m2  SpO2 97% Constitutional: Vital signs reviewed.  Patient is a well-developed and well-nourished in no acute distress and cooperative with exam. Alert and oriented x3.  Head: Normocephalic and atraumatic Mouth: no erythema or exudates, slightly dry MM Eyes: PERRL, EOMI, conjunctivae normal, No scleral icterus.  Neck: Supple, Trachea midline normal ROM, No JVD, mass, thyromegaly, or carotid bruit present.  Cardiovascular: RRR, S1 normal, S2 normal, no MRG, pulses symmetric and intact bilaterally Pulmonary/Chest: normal respiratory effort, CTAB, no wheezes, rales, or rhonchi Abdominal: Soft. Non-tender, non-distended, bowel sounds are normal, no masses, organomegaly, or guarding present.  GU: no CVA tenderness Extremities: No cyanosis and no edema  Neurological: A&O x3, Strength is normal and symmetric bilaterally, cranial nerve II-XII are grossly intact, no focal motor deficit, sensory intact to light touch bilaterally.  Skin: Warm, dry and intact. No rash, cyanosis, or clubbing.  Psychiatric: Normal mood and affect. speech and behavior is normal. Cognition and memory are normal.               Labs on Admission:  Basic Metabolic Panel:  Recent Labs Lab 01/19/13 1233  NA 137  K 3.8  CL 102  CO2 24  GLUCOSE 171*  BUN 22  CREATININE 0.89  CALCIUM 8.7   Liver Function Tests:  Recent Labs Lab 01/19/13 1233   AST 42*  ALT 32  ALKPHOS 97  BILITOT 0.6  PROT 6.4  ALBUMIN 3.5   No results found for this basename: LIPASE, AMYLASE,  in the last 168 hours No results found for this basename: AMMONIA,  in the last 168 hours CBC:  Recent Labs Lab 01/19/13 1233  WBC 6.3  HGB 15.7  HCT 43.4  MCV 100.9*  PLT 173   Cardiac Enzymes: No results found for this basename: CKTOTAL, CKMB, CKMBINDEX, TROPONINI,  in the last 168 hours  BNP (last 3 results) No results found for this basename: PROBNP,  in the last 8760 hours CBG: No results found for this basename: GLUCAP,  in the last 168 hours  Radiological Exams on Admission: No results found.    Assessment/Plan Active Problems:  Rectal bleeding -  As discussed above in 79 year old with history of diverticulosis presenting with painless results of Maroon blood per rectum. It is also noted that he has been on aspirin and intermittent meloxicam use -Likely diverticular bleed -Will monitor H&H, follow and transfuse as clinically appropriate, IVF -I Consulted [facility]-discussed patient with Dr. Loreta Ave on call and he is to be seen by [facility] in the am. Will keep on clear liquids for now -Hgb stable at 15.7 at this time. Hypertension -Hold off atenolol for now secondary to #1, monitor and cover with hydralazine when necessary for now. History of diverticulosis -Status post colonoscopy in the past per Dr. Arlyce Dice Elevated blood glucose -His hemoglobin A1c on 11/18 was 5.3. possibly stress induced, follow.     Code Status: full Family Communication: none Disposition Plan: admit to tele  Time spent: >30  Kela Millin [facility] Pager [phone]

## 2013-01-19 NOTE — ED Provider Notes (Signed)
CSN: [id number]     Arrival date & time 01/19/13  1149 History   First MD Initiated Contact with Patient 01/19/13 1220     Chief Complaint  Patient presents with  . Rectal Bleeding   (Consider location/radiation/quality/duration/timing/severity/associated sxs/prior Treatment) HPI Comments: Pt had 2 watery stools, "explosive" about 3-5 days ago, he doesn't recall, then took 2 imodium tablets that he has done in the past.  Since then no BM's.  He got up at around 0500 to use the restroom, felt something run down his leg.  He realized his underwear was wet from blood.  He subsequently had another episode with blood, clots come from rectum without pain.  He has had colonoscopy in the past and was told has diverticulosis.  No dizziness, feeling faint, back pain, abd pain.  Pt is not on blood thinners.  No CP, SOB.    Patient is a 79 y.o. male presenting with hematochezia. The history is provided by the patient, the spouse and medical records.  Rectal Bleeding Quality:  Maroon and bright red Duration:  1 day Timing:  Intermittent Progression:  Unchanged Chronicity:  New Context: constipation and diarrhea   Context: not hemorrhoids, not rectal injury and not rectal pain   Similar prior episodes: no   Associated symptoms: no abdominal pain, no dizziness, no fever, no light-headedness, no loss of consciousness, no recent illness and no vomiting   Risk factors: no anticoagulant use, no hx of colorectal cancer and no hx of colorectal surgery     Past Medical History  Diagnosis Date  . Diverticulosis   . Hypertension   . Hyperlipidemia   . Vitamin D deficiency   . Elevated hemoglobin A1c   . BPH (benign prostatic hypertrophy) with urinary obstruction    Past Surgical History  Procedure Laterality Date  . Rotator cuff repair      bilateral  . Rectal fissure procedure  1970's  . Tonsillectomy    . Anal fissure repair  1980   Family History  Problem Relation Age of Onset  . Colon cancer  Neg Hx   . Heart disease Mother   . Heart disease Father    History  Substance Use Topics  . Smoking status: Former Smoker    Quit date: 02/06/1998  . Smokeless tobacco: Former Neurosurgeon  . Alcohol Use: 1 - 1.5 oz/week    2-3 drink(s) per week     Comment: couple drinks weekly    Review of Systems  Constitutional: Negative for fever and appetite change.  Gastrointestinal: Positive for diarrhea, constipation, blood in stool and hematochezia. Negative for vomiting, abdominal pain and rectal pain.  Musculoskeletal: Negative for back pain.  Neurological: Negative for dizziness, loss of consciousness, syncope, weakness and light-headedness.  All other systems reviewed and are negative.    Allergies  Trazamine  Home Medications   Current Outpatient Rx  Name  Route  Sig  Dispense  Refill  . aspirin 81 MG tablet   Oral   Take 81 mg by mouth every morning.          Marland Kitchen atenolol (TENORMIN) 100 MG tablet   Oral   Take 100 mg by mouth every morning.          . B Complex-C (B-COMPLEX WITH VITAMIN C) tablet   Oral   Take 1 tablet by mouth every morning.          . Cholecalciferol (VITAMIN D3) 5000 UNITS CAPS   Oral   Take 1 capsule  by mouth every morning.         Marland Kitchen CRANBERRY EXTRACT PO   Oral   Take 1 capsule by mouth every morning.          . diazepam (VALIUM) 10 MG tablet   Oral   Take 10 mg by mouth 3 (three) times daily as needed for anxiety.         . Garlic 10 MG CAPS   Oral   Take 1 capsule by mouth every morning.          . magnesium gluconate (MAGONATE) 500 MG tablet   Oral   Take 500 mg by mouth every morning.         . meloxicam (MOBIC) 15 MG tablet   Oral   Take 15 mg by mouth as needed for pain.         . methocarbamol (ROBAXIN) 750 MG tablet   Oral   Take 750 mg by mouth 3 (three) times daily as needed for muscle spasms.         . pravastatin (PRAVACHOL) 40 MG tablet   Oral   Take 40 mg by mouth every morning.          . Saw  Palmetto, Serenoa repens, (SAW PALMETTO PO)   Oral   Take 1 capsule by mouth every morning.          . Selenium (SELENIMIN PO)   Oral   Take 1 capsule by mouth every morning.          Marland Kitchen VITAMIN B1-B12 PO   Oral   Take 1 capsule by mouth every morning.          . Zinc 50 MG CAPS   Oral   Take 1 capsule by mouth every morning.           BP 167/63  Pulse 42  Temp(Src) 97.6 F (36.4 C) (Oral)  Resp 20  Wt 207 lb (93.895 kg)  SpO2 97% Physical Exam  Nursing note and vitals reviewed. Constitutional: He is oriented to person, place, and time. He appears well-developed and well-nourished. No distress.  HENT:  Head: Normocephalic and atraumatic.  Eyes: Conjunctivae and EOM are normal. No scleral icterus.  Cardiovascular: Regular rhythm and intact distal pulses.  Bradycardia present.   Pulmonary/Chest: Effort normal.  Abdominal: Soft. Bowel sounds are normal. He exhibits no distension. There is no tenderness. There is no rebound and no guarding.  Genitourinary: Rectal exam shows no mass and no tenderness.  Gross blood in rectum, clots seen.    Neurological: He is alert and oriented to person, place, and time.  Skin: Skin is warm. He is not diaphoretic.    ED Course  Procedures (including critical care time) Labs Review Labs Reviewed  CBC - Abnormal; Notable for the following:    MCV 100.9 (*)    MCH 36.5 (*)    MCHC 36.2 (*)    All other components within normal limits  COMPREHENSIVE METABOLIC PANEL - Abnormal; Notable for the following:    Glucose, Bld 171 (*)    AST 42 (*)    GFR calc non Af Amer 79 (*)    All other components within normal limits  PROTIME-INR  APTT  SAMPLE TO BLOOD BANK   Imaging Review No results found.  EKG Interpretation   None       MDM   1. Rectal bleeding   2. History of diverticulosis   3. Hypertension   4. Chronic  sinus bradycardia     Pt with significant rectal bleeding, no syncope, not orthostatic.  Pt is not on  blood thinners, but at risk for significantly worsening rectal bleeding.  I would favor diverticulosis as source of bleeding.  No abd pain, fever, no abd tenderness on exam.  WBC is ok.  Will discuss with hospitalist for admission for monitoring and serial Hgb chekcs and possible consultation by Dr. Arlyce Dice.  Pt would be more comfortable being admitted for this as he is concerned for what may happen if he were to go home.      Gavin Pound. Oletta Lamas, MD 01/19/13 [phone]

## 2013-01-19 NOTE — ED Notes (Signed)
Pt from home c/o rectal bleeding since 5am this a.m. He had another episode at 10 a.m. A few days ago he had an episode of watery diarrhea in which he took Imodium for. Denies abdominal pain, nausea, and vomiting.

## 2013-01-20 DIAGNOSIS — K625 Hemorrhage of anus and rectum: Secondary | ICD-10-CM | POA: Diagnosis not present

## 2013-01-20 DIAGNOSIS — N139 Obstructive and reflux uropathy, unspecified: Secondary | ICD-10-CM | POA: Diagnosis not present

## 2013-01-20 DIAGNOSIS — K5730 Diverticulosis of large intestine without perforation or abscess without bleeding: Secondary | ICD-10-CM | POA: Diagnosis not present

## 2013-01-20 DIAGNOSIS — F988 Other specified behavioral and emotional disorders with onset usually occurring in childhood and adolescence: Secondary | ICD-10-CM | POA: Diagnosis not present

## 2013-01-20 DIAGNOSIS — N401 Enlarged prostate with lower urinary tract symptoms: Secondary | ICD-10-CM

## 2013-01-20 LAB — CBC
HCT: 41 % (ref 39.0–52.0)
Hemoglobin: 14.5 g/dL (ref 13.0–17.0)
MCH: 35.9 pg — ABNORMAL HIGH (ref 26.0–34.0)
MCHC: 35.4 g/dL (ref 30.0–36.0)
MCV: 101.5 fL — ABNORMAL HIGH (ref 78.0–100.0)
Platelets: 163 10*3/uL (ref 150–400)
RBC: 4.04 MIL/uL — ABNORMAL LOW (ref 4.22–5.81)
RDW: 13.2 % (ref 11.5–15.5)
WBC: 6.5 10*3/uL (ref 4.0–10.5)

## 2013-01-20 LAB — HEMOGLOBIN AND HEMATOCRIT, BLOOD
HCT: 40.4 % (ref 39.0–52.0)
HCT: 41 % (ref 39.0–52.0)
Hemoglobin: 14.4 g/dL (ref 13.0–17.0)
Hemoglobin: 14.7 g/dL (ref 13.0–17.0)

## 2013-01-20 MED ORDER — ATENOLOL 100 MG PO TABS: 100.0000 mg | ORAL_TABLET | Freq: Every morning | ORAL | Status: DC

## 2013-01-20 NOTE — Progress Notes (Signed)
Pt HR reportedly in the 40s since admission to the floor. Currently pt's rate  is dipping down into the high 30s, although pt remains asymptomatic. NP on call notified and no orders received at this time. Will continue to monitor Michael Ware Vitals:   01/19/13 1640 01/19/13 1721 01/19/13 2100 01/20/13 0652  BP: 179/56 196/76 154/50 156/42  Pulse: 42 49 39 50  Temp:  97.4 F (36.3 C) 97.6 F (36.4 C) 97.8 F (36.6 C)  TempSrc:  Oral Oral Oral  Resp: 19 18 18 16   Height:  5\' 10"  (1.778 m)    Weight:  212 lb (96.163 kg)    SpO2: 97% 97% 96% 94%

## 2013-01-20 NOTE — Consult Note (Signed)
Consultation  Referring Provider:  [facility]    Primary Care Physician:  Nadean Corwin, MD Primary Gastroenterologist:   Melvia Heaps, MD      Reason for Consultation:  GI bleed            HPI:   Michael Ware is a relatively healthy 78 y.o. male known to Dr. Arlyce Dice for a history of severe diverticulosis. Patient was last seen in December 2012 when he presented with abdominal pain , bowel changes and fever. We treated him empirically for diverticulitis. Labs and plain abdominal films were unremarkable.  Patient admitted yesterday with rectal bleeding. Presenting hgb 15.7, down to 14.4 overnight. BUN normal. He takes a baby asa daily, occasional Mobic. Patient soaked underclothes twice with blood on Sunday. He hasn't bled since arriving to ED. He took two Imodium last week, no BM since.   Past Medical History  Diagnosis Date  . Diverticulosis   . Hypertension   . Hyperlipidemia   . Vitamin D deficiency   . Elevated hemoglobin A1c   . BPH (benign prostatic hypertrophy) with urinary obstruction     Past Surgical History  Procedure Laterality Date  . Rotator cuff repair      bilateral  . Rectal fissure procedure  1970's  . Tonsillectomy    . Anal fissure repair  1980    Family History  Problem Relation Age of Onset  . Colon cancer Neg Hx   . Heart disease Mother   . Heart disease Father      History  Substance Use Topics  . Smoking status: Former Smoker    Quit date: 02/06/1998  . Smokeless tobacco: Former Neurosurgeon  . Alcohol Use: 1 - 1.5 oz/week    2-3 drink(s) per week     Comment: couple drinks weekly    Prior to Admission medications   Medication Sig Start Date End Date Taking? Authorizing Provider  aspirin 81 MG tablet Take 81 mg by mouth every morning.    Yes Historical Provider, MD  atenolol (TENORMIN) 100 MG tablet Take 100 mg by mouth every morning.    Yes Historical Provider, MD  B Complex-C (B-COMPLEX WITH VITAMIN C) tablet Take 1 tablet  by mouth every morning.    Yes Historical Provider, MD  Cholecalciferol (VITAMIN D3) 5000 UNITS CAPS Take 1 capsule by mouth every morning.   Yes Historical Provider, MD  CRANBERRY EXTRACT PO Take 1 capsule by mouth every morning.    Yes Historical Provider, MD  diazepam (VALIUM) 10 MG tablet Take 10 mg by mouth 3 (three) times daily as needed for anxiety.   Yes Historical Provider, MD  Garlic 10 MG CAPS Take 1 capsule by mouth every morning.    Yes Historical Provider, MD  magnesium gluconate (MAGONATE) 500 MG tablet Take 500 mg by mouth every morning.   Yes Historical Provider, MD  meloxicam (MOBIC) 15 MG tablet Take 15 mg by mouth as needed for pain.   Yes Historical Provider, MD  methocarbamol (ROBAXIN) 750 MG tablet Take 750 mg by mouth 3 (three) times daily as needed for muscle spasms.   Yes Historical Provider, MD  pravastatin (PRAVACHOL) 40 MG tablet Take 40 mg by mouth every morning.    Yes Historical Provider, MD  Saw Palmetto, Serenoa repens, (SAW PALMETTO PO) Take 1 capsule by mouth every morning.    Yes Historical Provider, MD  Selenium (SELENIMIN PO) Take 1 capsule by mouth every morning.    Yes Historical  Provider, MD  VITAMIN B1-B12 PO Take 1 capsule by mouth every morning.    Yes Historical Provider, MD  Zinc 50 MG CAPS Take 1 capsule by mouth every morning.    Yes Historical Provider, MD    Current Facility-Administered Medications  Medication Dose Route Frequency Provider Last Rate Last Dose  . acetaminophen (TYLENOL) tablet 650 mg  650 mg Oral Q6H PRN Kela Millin, MD       Or  . acetaminophen (TYLENOL) suppository 650 mg  650 mg Rectal Q6H PRN Adeline C Viyuoh, MD      . B-complex with vitamin C tablet 1 tablet  1 tablet Oral q morning - 10a Adeline C Viyuoh, MD      . cholecalciferol (VITAMIN D) tablet 5,000 Units  5,000 Units Oral q morning - 10a Adeline C Viyuoh, MD      . diazepam (VALIUM) tablet 10 mg  10 mg Oral TID PRN Kela Millin, MD   10 mg at 01/20/13  0235  . hydrALAZINE (APRESOLINE) injection 10 mg  10 mg Intravenous Q6H PRN Adeline C Viyuoh, MD      . methocarbamol (ROBAXIN) tablet 750 mg  750 mg Oral TID PRN Kela Millin, MD      . ondansetron (ZOFRAN) tablet 4 mg  4 mg Oral Q6H PRN Adeline C Viyuoh, MD       Or  . ondansetron (ZOFRAN) injection 4 mg  4 mg Intravenous Q6H PRN Adeline C Viyuoh, MD      . pantoprazole (PROTONIX) EC tablet 40 mg  40 mg Oral Daily Adeline C Viyuoh, MD      . simvastatin (ZOCOR) tablet 20 mg  20 mg Oral q1800 Kela Millin, MD   20 mg at 01/20/13 0943  . sodium chloride 0.9 % injection 3 mL  3 mL Intravenous Q12H Adeline C Viyuoh, MD      . zolpidem (AMBIEN) tablet 5 mg  5 mg Oral QHS PRN Kela Millin, MD   5 mg at 01/19/13 2242    Allergies as of 01/19/2013 - Review Complete 01/19/2013  Allergen Reaction Noted  . Trazamine [trazodone & diet manage prod]  12/23/2012    Review of Systems:    All systems reviewed and negative except where noted in HPI.   Physical Exam:  Vital signs in last 24 hours: Temp:  [97.4 F (36.3 C)-97.8 F (36.6 C)] 97.8 F (36.6 C) (12/15 0652) Pulse Rate:  [39-51] 50 (12/15 0652) Resp:  [12-20] 16 (12/15 0652) BP: (154-199)/(42-76) 156/42 mmHg (12/15 0652) SpO2:  [91 %-100 %] 94 % (12/15 0652) Weight:  [207 lb (93.895 kg)-212 lb (96.163 kg)] 212 lb (96.163 kg) (12/14 1721) Last BM Date: 01/15/13 General:   Pleasant white male in NAD Head:  Normocephalic and atraumatic. Eyes:   No icterus.   Conjunctiva pink. Ears:  Normal auditory acuity. Neck:  Supple; no masses felt Lungs:  Respirations even and unlabored. Lungs clear to auscultation bilaterally.   No wheezes, crackles, or rhonchi.  Heart:  Regular rate and rhythm;  murmur heard. Abdomen:  Soft, nondistended, nontender. Normal bowel sounds. No appreciable masses or hepatomegaly.  Rectal:  Moderate bright red in vault. .  Msk:  Symmetrical without gross deformities.  Extremities:  Without  edema. Neurologic:  Alert and  oriented x4;  grossly normal neurologically. Skin:  Intact without significant lesions or rashes. Cervical Nodes:  No significant cervical adenopathy. Psych:  Alert and cooperative. Normal affect.  LAB RESULTS:  Recent Labs  01/19/13 1233 01/19/13 1958 01/20/13 0217 01/20/13 0800  WBC 6.3  --  6.5  --   HGB 15.7 14.7 14.5 14.4  HCT 43.4 42.2 41.0 40.4  PLT 173  --  163  --    BMET  Recent Labs  01/19/13 1233  NA 137  K 3.8  CL 102  CO2 24  GLUCOSE 171*  BUN 22  CREATININE 0.89  CALCIUM 8.7   LFT  Recent Labs  01/19/13 1233  PROT 6.4  ALBUMIN 3.5  AST 42*  ALT 32  ALKPHOS 97  BILITOT 0.6   PT/INR  Recent Labs  01/19/13 1339  LABPROT 13.1  INR 1.01    PREVIOUS ENDOSCOPIES:            June 2010 screening colonoscopy. Findings: severe sigmoid diverticulosis.    Impression / Plan:   31. 79 year old male with lower GI bleed, suspect diverticular hemorrhage. Bleeding has resolved. Hgb down slightly over a gram but still in normal range at 14.4. Last colonoscopy June 2010 with findings of severe diverticular disease. Doubt repeat colonoscopy will be necessary but given that there is still some bright red blood in vault will monitor closely. Keep on clears, discussed with patient.  He is 24 hours out from bleeding, if no further bleeding this afternoon will advance diet.  2. HTN, on chronic atenolol.   3. Hyperlipidemia, on statin.   Thanks   LOS: 1 day   Willette Cluster  01/20/2013, 10:54 AM  Chart was reviewed and patient was examined. X-rays and lab were reviewed.    I agree with management and plans.  Strongly suspect diverticular bleed that has stopped.  Plan continue close observation.  Barbette Hair. Arlyce Dice, M.D., [facility] Cell [phone]

## 2013-01-20 NOTE — Progress Notes (Signed)
Patient ID: Michael Ware, male   DOB: [birth year], 79 y.o.   MRN: [id number]  [facility] PROGRESS NOTE  DAKWAN PRIDGEN WUJ:[id number] DOB: 09/07/1931 DOA: 01/19/2013 PCP: Nadean Corwin, MD  Brief narrative: 79 y.o. male past medical history significant for diverticulosis presented with main concern of 2 episodes of BRBPR. He states that about 4-5 days ago he had a couple of episodes of diarrhea and so took Imodium, and has had no further stools. He takes a baby aspirin, and was on meloxicam which he tool one week prior to this admission. He was seen in the ED and his hemoglobin was stable at 15.7, hemodynamically stable. His heart rate was in the 40s and it is noted that he is on atenolol and his office records indictate that this is about baseline for him. Rectal exam per EDP revealed maroon blood clots. He is admitted for further evaluation and management.   Active Problems:   Rectal bleeding - possibly diverticular but clear etiology not known at this time  - appreciate GI input - follow upon recommendations Bradycardia - discussed with PCP, will continue to hold Atenolol for now - cardiology consult for further recommendations  HTN - close monitoring as we stopped Atenolol  Hyperglycemia - will check A1C  Consultants:  GI  Procedures/Studies:  None  Antibiotics:  None  Code Status: Full Family Communication: Pt at bedside Disposition Plan: Home when medically stable  HPI/Subjective: No events overnight.   Objective: Filed Vitals:   01/19/13 1640 01/19/13 1721 01/19/13 2100 01/20/13 0652  BP: 179/56 196/76 154/50 156/42  Pulse: 42 49 39 50  Temp:  97.4 F (36.3 C) 97.6 F (36.4 C) 97.8 F (36.6 C)  TempSrc:  Oral Oral Oral  Resp: 19 18 18 16   Height:  5\' 10"  (1.778 m)    Weight:  96.163 kg (212 lb)    SpO2: 97% 97% 96% 94%    Intake/Output Summary (Last 24 hours) at 01/20/13 1231 Last data filed at 01/20/13 1045  Gross per 24 hour  Intake  1293.75 ml  Output    550 ml  Net 743.75 ml    Exam:   General:  Pt is alert, follows commands appropriately, not in acute distress  Cardiovascular: Regular rate and rhythm, S1/S2, no murmurs, no rubs, no gallops  Respiratory: Clear to auscultation bilaterally, no wheezing, no crackles, no rhonchi  Abdomen: Soft, non tender, non distended, bowel sounds present, no guarding  Extremities: No edema, pulses DP and PT palpable bilaterally  Neuro: Grossly nonfocal  Data Reviewed: Basic Metabolic Panel:  Recent Labs Lab 01/19/13 1233  NA 137  K 3.8  CL 102  CO2 24  GLUCOSE 171*  BUN 22  CREATININE 0.89  CALCIUM 8.7   Liver Function Tests:  Recent Labs Lab 01/19/13 1233  AST 42*  ALT 32  ALKPHOS 97  BILITOT 0.6  PROT 6.4  ALBUMIN 3.5   CBC:  Recent Labs Lab 01/19/13 1233 01/19/13 1958 01/20/13 0217 01/20/13 0800  WBC 6.3  --  6.5  --   HGB 15.7 14.7 14.5 14.4  HCT 43.4 42.2 41.0 40.4  MCV 100.9*  --  101.5*  --   PLT 173  --  163  --    Scheduled Meds: . B-complex with vitamin C  1 tablet Oral q morning - 10a  . cholecalciferol  5,000 Units Oral q morning - 10a  . pantoprazole  40 mg Oral Daily  . simvastatin  20 mg Oral  [id number]  . sodium chloride  3 mL Intravenous Q12H   Continuous Infusions:   Debbora Presto, MD  [facility] Pager [phone]  If 7PM-7AM, please contact night-coverage [email or web address] Password [email or web address] 01/20/2013, 12:31 PM   LOS: 1 day

## 2013-01-20 NOTE — Care Management Note (Signed)
    Page 1 of 1   01/20/2013     1:24:30 PM   CARE MANAGEMENT NOTE 01/20/2013  Patient:  Michael Ware, Michael Ware   Account Number:  [id number]  Date Initiated:  01/20/2013  Documentation initiated by:  Lanier Clam  Subjective/Objective Assessment:   79 Y/O M ADMITTED W/RECTAL BLEED.     Action/Plan:   FROM HOME.HAS PCP,PHARMACY.   Anticipated DC Date:  01/23/2013   Anticipated DC Plan:  HOME/SELF CARE      DC Planning Services  CM consult      Choice offered to / List presented to:             Status of service:  In process, will continue to follow Medicare Important Message given?   (If response is "NO", the following Medicare IM given date fields will be blank) Date Medicare IM given:   Date Additional Medicare IM given:    Discharge Disposition:    Per UR Regulation:  Reviewed for med. necessity/level of care/duration of stay  If discussed at Long Length of Stay Meetings, dates discussed:    Comments:  01/19/13 Pranathi Winfree RN,BSN NCM [phone] GI FOLLOWING.

## 2013-01-21 DIAGNOSIS — K625 Hemorrhage of anus and rectum: Secondary | ICD-10-CM | POA: Diagnosis not present

## 2013-01-21 DIAGNOSIS — N139 Obstructive and reflux uropathy, unspecified: Secondary | ICD-10-CM | POA: Diagnosis not present

## 2013-01-21 DIAGNOSIS — N401 Enlarged prostate with lower urinary tract symptoms: Secondary | ICD-10-CM | POA: Diagnosis not present

## 2013-01-21 DIAGNOSIS — F988 Other specified behavioral and emotional disorders with onset usually occurring in childhood and adolescence: Secondary | ICD-10-CM | POA: Diagnosis not present

## 2013-01-21 LAB — BASIC METABOLIC PANEL
BUN: 15 mg/dL (ref 6–23)
CO2: 27 mEq/L (ref 19–32)
Calcium: 8.7 mg/dL (ref 8.4–10.5)
Chloride: 103 mEq/L (ref 96–112)
Creatinine, Ser: 0.98 mg/dL (ref 0.50–1.35)
GFR calc Af Amer: 88 mL/min — ABNORMAL LOW (ref >90)
GFR calc non Af Amer: 76 mL/min — ABNORMAL LOW (ref >90)
Glucose, Bld: 105 mg/dL — ABNORMAL HIGH (ref 70–99)
Potassium: 3.8 mEq/L (ref 3.5–5.1)
Sodium: 137 mEq/L (ref 135–145)

## 2013-01-21 LAB — CBC
HCT: 40.5 % (ref 39.0–52.0)
Hemoglobin: 14.4 g/dL (ref 13.0–17.0)
MCH: 36.3 pg — ABNORMAL HIGH (ref 26.0–34.0)
MCHC: 35.6 g/dL (ref 30.0–36.0)
MCV: 102 fL — ABNORMAL HIGH (ref 78.0–100.0)
Platelets: 171 10*3/uL (ref 150–400)
RBC: 3.97 MIL/uL — ABNORMAL LOW (ref 4.22–5.81)
RDW: 13.2 % (ref 11.5–15.5)
WBC: 6.7 10*3/uL (ref 4.0–10.5)

## 2013-01-21 LAB — HEMOGLOBIN A1C
Hgb A1c MFr Bld: 5.1 % (ref <5.7)
Mean Plasma Glucose: 100 mg/dL (ref <117)

## 2013-01-21 MED ORDER — BISACODYL 10 MG RE SUPP
10.0000 mg | Freq: Once | RECTAL | Status: DC
  Filled 2013-01-21 (×2): qty 1

## 2013-01-21 MED ORDER — BISACODYL 10 MG RE SUPP
10.0000 mg | Freq: Once | RECTAL | Status: AC
  Administered 2013-01-21: 10 mg via RECTAL

## 2013-01-21 NOTE — Progress Notes (Signed)
Patient refused SCD's and skid resistant socks. Explained to the patient the importance of both of these items and the patient still refused. Patient is up walking around in room at this time and is alert and oriented and is aware of the safety plan for falls. Will continue to monitor patient.

## 2013-01-21 NOTE — Discharge Summary (Signed)
Physician Discharge Summary  PAGE LANCON BJY:[id number] DOB: 12-27-1931 DOA: 01/19/2013  PCP: Nadean Corwin, MD  Admit date: 01/19/2013 Discharge date: 01/21/2013  Recommendations for Outpatient Follow-up:  1. Pt will need to follow up with PCP in 2-3 weeks post discharge 2. Please obtain BMP to evaluate electrolytes and kidney function 3. Please also check CBC to evaluate Hg and Hct levels 4. Please note that Atenolol was stopped due to bradycardia 5. Pt has an appointment scheduled with Dr. Johney Frame for further evaluation of bradycardia   Discharge Diagnoses:  Active Problems:   Rectal bleeding  Discharge Condition: Stable  Diet recommendation: Heart healthy diet discussed in details   Brief narrative:  79 y.o. male past medical history significant for diverticulosis presented with main concern of 2 episodes of BRBPR. He states that about 4-5 days ago he had a couple of episodes of diarrhea and so took Imodium, and has had no further stools. He takes a baby aspirin, and was on meloxicam which he tool one week prior to this admission. He was seen in the ED and his hemoglobin was stable at 15.7, hemodynamically stable. His heart rate was in the 40s and it is noted that he is on atenolol and his office records indictate that this is about baseline for him. Rectal exam per EDP revealed maroon blood clots. He is admitted for further evaluation and management.   Active Problems:  Rectal bleeding  - possibly diverticular but clear etiology not known at this time  - appreciate GI input  - no further bloody stools - Hg remains stable and within normal limits  Bradycardia  - discussed with PCP, will continue to hold Atenolol for now  - cardiology appointment scheduled for further recommendations  - pt denies chest pain and shortness of breath  HTN  - reasonable inpatient control of Atenolol  Hyperglycemia  - reasonable inpatient control, A1C 5.3   Consultants:   GI Procedures/Studies:  None Antibiotics:  None  Code Status: Full  Family Communication: Pt at bedside  Disposition Plan: Home today    Discharge Exam: Filed Vitals:   01/21/13 1351  BP: 145/51  Pulse: 47  Temp: 97.5 F (36.4 C)  Resp: 18   Filed Vitals:   01/20/13 1407 01/20/13 2135 01/21/13 0503 01/21/13 1351  BP: 149/43 149/56 157/50 145/51  Pulse: 44 50 50 47  Temp: 98 F (36.7 C) 98.4 F (36.9 C) 97.9 F (36.6 C) 97.5 F (36.4 C)  TempSrc: Oral Oral Oral Oral  Resp: 16 20 20 18   Height:      Weight:      SpO2: 93% 94% 92% 91%    General: Pt is alert, follows commands appropriately, not in acute distress Cardiovascular: Regular rhythm, bradycardia, S1/S2 +, no murmurs, no rubs, no gallops Respiratory: Clear to auscultation bilaterally, no wheezing, no crackles, no rhonchi Abdominal: Soft, non tender, non distended, bowel sounds +, no guarding Extremities: no edema, no cyanosis, pulses palpable bilaterally DP and PT Neuro: Grossly nonfocal  Discharge Instructions  Discharge Orders   Future Appointments Provider Department Dept Phone   02/27/2013 3:00 PM Hillis Range, MD [facility] Office [phone]   12/30/2013 3:45 PM Lucky Cowboy, MD [facility] [phone]   Future Orders Complete By Expires   Diet - low sodium heart healthy  As directed    Increase activity slowly  As directed        Medication List    STOP taking these medications  atenolol 100 MG tablet  Commonly known as:  TENORMIN      TAKE these medications       aspirin 81 MG tablet  Take 81 mg by mouth every morning.     B-complex with vitamin C tablet  Take 1 tablet by mouth every morning.     CRANBERRY EXTRACT PO  Take 1 capsule by mouth every morning.     diazepam 10 MG tablet  Commonly known as:  VALIUM  Take 10 mg by mouth 3 (three) times daily as needed for anxiety.     Garlic 10 MG Caps  Take 1 capsule by  mouth every morning.     magnesium gluconate 500 MG tablet  Commonly known as:  MAGONATE  Take 500 mg by mouth every morning.     meloxicam 15 MG tablet  Commonly known as:  MOBIC  Take 15 mg by mouth as needed for pain.     methocarbamol 750 MG tablet  Commonly known as:  ROBAXIN  Take 750 mg by mouth 3 (three) times daily as needed for muscle spasms.     pravastatin 40 MG tablet  Commonly known as:  PRAVACHOL  Take 40 mg by mouth every morning.     SAW PALMETTO PO  Take 1 capsule by mouth every morning.     SELENIMIN PO  Take 1 capsule by mouth every morning.     VITAMIN B1-B12 PO  Take 1 capsule by mouth every morning.     Vitamin D3 5000 UNITS Caps  Take 1 capsule by mouth every morning.     Zinc 50 MG Caps  Take 1 capsule by mouth every morning.           Follow-up Information   Follow up with MCKEOWN,WILLIAM DAVID, MD In 2 weeks.   Specialty:  Internal Medicine   Contact information:   [address] [place] [address] [phone]       Follow up with Debbora Presto, MD. (As needed if symptoms worsen call my cell phone [phone])    Specialty:  Internal Medicine   Contact information:   [address] [place] [address] [phone]       Follow up with Hillis Range, MD On 02/19/2013. (appointment scheduled January 14th, 2015 at 8:30 am)    Specialty:  Cardiology   Contact information:   [address] [place] [address] [phone]        The results of significant diagnostics from this hospitalization (including imaging, microbiology, ancillary and laboratory) are listed below for reference.     Microbiology: No results found for this or any previous visit (from the past 240 hour(s)).   Labs: Basic Metabolic Panel:  Recent Labs Lab 01/19/13 1233 01/21/13 0453  NA 137 137  K 3.8 3.8  CL 102 103  CO2 24 27  GLUCOSE 171* 105*  BUN 22 15  CREATININE 0.89 0.98  CALCIUM 8.7 8.7    Liver Function Tests:  Recent Labs Lab 01/19/13 1233  AST 42*  ALT 32  ALKPHOS 97  BILITOT 0.6  PROT 6.4  ALBUMIN 3.5   CBC:  Recent Labs Lab 01/19/13 1233 01/19/13 1958 01/20/13 0217 01/20/13 0800 01/20/13 1355 01/21/13 0453  WBC 6.3  --  6.5  --   --  6.7  HGB 15.7 14.7 14.5 14.4 14.7 14.4  HCT 43.4 42.2 41.0 40.4 41.0 40.5  MCV 100.9*  --  101.5*  --   --  102.0*  PLT 173  --  163  --   --  171    SIGNED: Time coordinating discharge: Over 30 minutes  Debbora Presto, MD  [facility] 01/21/2013, 9:54 PM Pager [phone]  If 7PM-7AM, please contact night-coverage [email or web address] Password [email or web address]

## 2013-01-21 NOTE — Progress Notes (Signed)
    Progress Note   Subjective  eating breakfast. Feels fine. Constipated   Objective   Vital signs in last 24 hours: Temp:  [97.9 F (36.6 C)-98.4 F (36.9 C)] 97.9 F (36.6 C) (12/16 0503) Pulse Rate:  [44-50] 50 (12/16 0503) Resp:  [16-20] 20 (12/16 0503) BP: (149-157)/(43-56) 157/50 mmHg (12/16 0503) SpO2:  [92 %-94 %] 92 % (12/16 0503) Last BM Date: 01/15/13 General:    white male in NAD Lungs: Respirations even and unlabored, lungs CTA bilaterally Abdomen:  Soft, nontender and nondistended. Normal bowel sounds. .Neurologic:  Alert and oriented,  grossly normal neurologically. Psych:  Cooperative. Normal mood and affect.  Lab Results:  Recent Labs  01/19/13 1233  01/20/13 0217 01/20/13 0800 01/20/13 1355 01/21/13 0453  WBC 6.3  --  6.5  --   --  6.7  HGB 15.7  < > 14.5 14.4 14.7 14.4  HCT 43.4  < > 41.0 40.4 41.0 40.5  PLT 173  --  163  --   --  171  < > = values in this interval not displayed. BMET  Recent Labs  01/19/13 1233 01/21/13 0453  NA 137 137  K 3.8 3.8  CL 102 103  CO2 24 27  GLUCOSE 171* 105*  BUN 22 15  CREATININE 0.89 0.98  CALCIUM 8.7 8.7   LFT  Recent Labs  01/19/13 1233  PROT 6.4  ALBUMIN 3.5  AST 42*  ALT 32  ALKPHOS 97  BILITOT 0.6      Assessment / Plan:   77. 79 year old male with lower GI bleed, suspect diverticular hemorrhage. Bleeding has resolved. Hgb down slightly over a gram but still in normal range at 14.4. He is stable to go home today from GI standpoint  2. Constipation. No BM since taking Imodium last week. Will give Dulcolax suppository now as patient a little anxious about bleedings with next BM. He does know to expect some old blood.    LOS: 2 days   Willette Cluster  01/21/2013, 9:11 AM  I have personally taken an interval history, reviewed the chart, and examined the patient.  I agree with the extender's note, impression and recommendations.  Barbette Hair. Arlyce Dice, MD, [facility] [place] [facility] [phone]

## 2013-01-22 ENCOUNTER — Telehealth: Payer: Self-pay | Admitting: Nurse Practitioner

## 2013-01-22 NOTE — Telephone Encounter (Signed)
Spoke with patient and Willette Cluster, NP saw him as inpatient. He states Dr. Arlyce Dice told him to come in for OV in a couple weeks. He wants to know why he needs to come for OV. Please, advise.

## 2013-01-24 NOTE — Telephone Encounter (Signed)
Patient called back asking again if he needs f/u OV with Dr. Arlyce Dice. Please, advise

## 2013-01-27 NOTE — Telephone Encounter (Signed)
This is a followup office visit to be sure that he does not have any other ongoing GI issues including bleeding for which he was in the hospital.

## 2013-01-27 NOTE — Telephone Encounter (Signed)
Unable to reach patient.  Will try again tomorrow.

## 2013-01-28 NOTE — Telephone Encounter (Signed)
Spoke with patient and gave him Dr. Marzetta Board recommendation. He will call back to schedule OV.

## 2013-02-10 ENCOUNTER — Encounter: Payer: Self-pay | Admitting: Gastroenterology

## 2013-02-10 ENCOUNTER — Ambulatory Visit (INDEPENDENT_AMBULATORY_CARE_PROVIDER_SITE_OTHER): Payer: Medicare Other | Admitting: Gastroenterology

## 2013-02-10 VITALS — BP 144/72 | HR 56 | Ht 66.0 in | Wt 214.4 lb

## 2013-02-10 DIAGNOSIS — K625 Hemorrhage of anus and rectum: Secondary | ICD-10-CM

## 2013-02-10 NOTE — Progress Notes (Signed)
_                                                                                                                History of Present Illness: Mrs. [name] first posthospitalization visit.  Last month he was admitted with acute lower GI bleed that was felt secondary to diverticula.  2010 colonoscopy demonstrated diffuse diverticulosis.  In hospital bleeding stopped spontaneously.  He has had no recurrence.  He is anxious to repeat colonoscopy to be sure of the bleeding source.    Past Medical History  Diagnosis Date  . Diverticulosis   . Hypertension   . Hyperlipidemia   . Vitamin D deficiency   . Elevated hemoglobin A1c   . BPH (benign prostatic hypertrophy) with urinary obstruction    Past Surgical History  Procedure Laterality Date  . Rotator cuff repair      bilateral  . Rectal fissure procedure  1970's  . Tonsillectomy    . Anal fissure repair  1980   family history includes Heart disease in his father and mother. There is no history of Colon cancer. Current Outpatient Prescriptions  Medication Sig Dispense Refill  . aspirin 81 MG tablet Take 81 mg by mouth every morning.       . B Complex-C (B-COMPLEX WITH VITAMIN C) tablet Take 1 tablet by mouth every morning.       . calcium carbonate (OS-CAL) 600 MG TABS tablet Take 600 mg by mouth daily with breakfast.      . Cholecalciferol (VITAMIN D3) 5000 UNITS CAPS Take 1 capsule by mouth every morning.      Marland Kitchen CRANBERRY EXTRACT PO Take 1 capsule by mouth every morning.       . diazepam (VALIUM) 10 MG tablet Take 10 mg by mouth 3 (three) times daily as needed for anxiety.      . Garlic 10 MG CAPS Take 1 capsule by mouth every morning.       . magnesium gluconate (MAGONATE) 500 MG tablet Take 500 mg by mouth every morning.      . meloxicam (MOBIC) 15 MG tablet Take 15 mg by mouth as needed for pain.      . methocarbamol (ROBAXIN) 750 MG tablet Take 750 mg by mouth 3 (three) times daily as needed for muscle  spasms.      . pravastatin (PRAVACHOL) 40 MG tablet Take 40 mg by mouth every morning.       . Saw Palmetto, Serenoa repens, (SAW PALMETTO PO) Take 1 capsule by mouth every morning.       . Selenium (SELENIMIN PO) Take 1 capsule by mouth every morning.       Marland Kitchen VITAMIN B1-B12 PO Take 1 capsule by mouth every morning.       . Zinc 50 MG CAPS Take 1 capsule by mouth every morning.        No current facility-administered medications for this visit.   Allergies as of 02/10/2013 - Review Complete 02/10/2013  Allergen Reaction Noted  .  Trazamine [trazodone & diet manage prod]  12/23/2012    reports that he quit smoking about 15 years ago. He has quit using smokeless tobacco. He reports that he drinks about 1.0 ounces of alcohol per week. He reports that he does not use illicit drugs.     Review of Systems: He has low back pain from disc disease Pertinent positive and negative review of systems were noted in the above HPI section. All other review of systems were otherwise negative.  Vital signs were reviewed in today's medical record Physical Exam: General: Well developed , well nourished, no acute distress Skin: anicteric Head: Normocephalic and atraumatic Eyes:  sclerae anicteric, EOMI Ears: Normal auditory acuity Mouth: No deformity or lesions Neck: Supple, no masses or thyromegaly Lungs: Clear throughout to auscultation Heart: Regular rate and rhythm; no murmurs, rubs or bruits Abdomen: Soft, non tender and non distended. No masses, hepatosplenomegaly  noted. Normal Bowel sounds.  There is a small reducible ventral hernia in the midline Rectal:deferred Musculoskeletal: Symmetrical with no gross deformities  Skin: No lesions on visible extremities Pulses:  Normal pulses noted Extremities: No clubbing, cyanosis, edema or deformities noted Neurological: Alert oriented x 4, grossly nonfocal Cervical Nodes:  No significant cervical adenopathy Inguinal Nodes: No significant inguinal  adenopathy Psychological:  Alert and cooperative. Normal mood and affect  See Assessment and Plan under Problem List

## 2013-02-10 NOTE — Assessment & Plan Note (Signed)
Patient has had an acute lower GI bleed.  This is most likely to 2 diverticulosis.  Other colonic bleeding sources such as AVMs and hemorrhoids should be considered.  Doubt neoplasm in the face of colonoscopy in 2010.  Recommendations #1 colonoscopy

## 2013-02-10 NOTE — Patient Instructions (Signed)

## 2013-02-12 ENCOUNTER — Encounter: Payer: Self-pay | Admitting: Internal Medicine

## 2013-02-12 ENCOUNTER — Ambulatory Visit (INDEPENDENT_AMBULATORY_CARE_PROVIDER_SITE_OTHER): Payer: Medicare Other | Admitting: Internal Medicine

## 2013-02-12 VITALS — BP 151/78 | HR 81 | Ht 67.0 in | Wt 211.8 lb

## 2013-02-12 DIAGNOSIS — I10 Essential (primary) hypertension: Secondary | ICD-10-CM

## 2013-02-12 DIAGNOSIS — I498 Other specified cardiac arrhythmias: Secondary | ICD-10-CM | POA: Diagnosis not present

## 2013-02-12 DIAGNOSIS — R001 Bradycardia, unspecified: Secondary | ICD-10-CM

## 2013-02-12 NOTE — Patient Instructions (Signed)
Your physician recommends that you schedule a follow-up appointment as needed  

## 2013-02-16 DIAGNOSIS — R001 Bradycardia, unspecified: Secondary | ICD-10-CM | POA: Insufficient documentation

## 2013-02-16 NOTE — Progress Notes (Signed)
Primary Care Physician: Alesia Richards, MD Referring Physician:  [facility]   Michael Ware is a 80 y.o. male with a h/o HTN who was recently hospitalized for GI bleeding.  While in the hospital, he was observed to have sinus bradycardia 40s.  He was asymptomatic at the time.  His atenolol was discontinued.  He has done well since with resolution of bradycardia.  He was referred to our office for outpatient follow-up. He has done well since he left the hospital.  Today, he denies symptoms of palpitations, chest pain, shortness of breath, orthopnea, PND, lower extremity edema, dizziness, presyncope, syncope, or neurologic sequela. The patient is tolerating medications without difficulties and is otherwise without complaint today.   Past Medical History  Diagnosis Date  . Diverticulosis   . Hypertension   . Hyperlipidemia   . Vitamin D deficiency   . Elevated hemoglobin A1c   . BPH (benign prostatic hypertrophy) with urinary obstruction   . Bradycardia   . Rectal bleed 01/19/13  . Hyperglycemia   . Acute GI bleeding 01/19/13    most likely due to diverticulosis   Past Surgical History  Procedure Laterality Date  . Rotator cuff repair      bilateral  . Rectal fissure procedure  1970's  . Tonsillectomy    . Anal fissure repair  1980    Current Outpatient Prescriptions  Medication Sig Dispense Refill  . aspirin 81 MG tablet Take 81 mg by mouth every morning.       . B Complex-C (B-COMPLEX WITH VITAMIN C) tablet Take 1 tablet by mouth every morning.       . calcium carbonate (OS-CAL) 600 MG TABS tablet Take 600 mg by mouth daily with breakfast.      . Cholecalciferol (VITAMIN D3) 5000 UNITS CAPS Take 1 capsule by mouth every morning.      Marland Kitchen CRANBERRY EXTRACT PO Take 1 capsule by mouth every morning.       . Cyanocobalamin (VITAMIN B-12 CR PO) Take 1 tablet by mouth daily.      . diazepam (VALIUM) 10 MG tablet Take 10 mg by mouth 3 (three) times daily as needed.         . Garlic 10 MG CAPS Take 1 capsule by mouth every morning.       . magnesium gluconate (MAGONATE) 500 MG tablet Take 500 mg by mouth every morning.      . meloxicam (MOBIC) 15 MG tablet Take 15 mg by mouth as needed for pain.      . methocarbamol (ROBAXIN) 750 MG tablet Take 750 mg by mouth 3 (three) times daily as needed for muscle spasms.      . pravastatin (PRAVACHOL) 40 MG tablet Take 40 mg by mouth every morning.       . Pyridoxine HCl (VITAMIN B-6 PO) Take 1 tablet by mouth daily.      . Saw Palmetto, Serenoa repens, (SAW PALMETTO PO) Take 1 capsule by mouth every morning.       . Selenium (SELENIMIN PO) Take 1 capsule by mouth every morning.       . Zinc 50 MG CAPS Take 1 capsule by mouth every morning.        No current facility-administered medications for this visit.    Allergies  Allergen Reactions  . Trazamine [Trazodone & Diet Manage Prod]     Dry mouth    History   Social History  . Marital Status: Married    Spouse  Name: N/A    Number of Children: N/A  . Years of Education: N/A   Occupational History  . Not on file.   Social History Main Topics  . Smoking status: Former Smoker    Quit date: 02/06/1998  . Smokeless tobacco: Former Systems developer  . Alcohol Use: 1 - 1.5 oz/week    2-3 drink(s) per week     Comment: couple drinks weekly  . Drug Use: No  . Sexual Activity: Not on file   Other Topics Concern  . Not on file   Social History Narrative  . No narrative on file    Family History  Problem Relation Age of Onset  . Colon cancer Neg Hx   . Heart disease Mother   . Heart disease Father     ROS- All systems are reviewed and negative except as per the HPI above  Physical Exam: Filed Vitals:   02/12/13 1613  BP: 151/78  Pulse: 81  Height: 5\' 7"  (1.702 m)  Weight: 211 lb 12.8 oz (96.072 kg)    GEN- The patient is elderly appearing, alert and oriented x 3 today.   Head- normocephalic, atraumatic Eyes-  Sclera clear, conjunctiva pink Ears-  hearing intact Oropharynx- clear Neck- supple, Lungs- Clear to ausculation bilaterally, normal work of breathing Heart- Regular rate and rhythm, no murmurs, rubs or gallops, PMI not laterally displaced GI- soft, NT, ND, + BS Extremities- no clubbing, cyanosis, or edema MS- no significant deformity or atrophy Skin- no rash or lesion Psych- euthymic mood, full affect Neuro- strength and sensation are intact  EKG today reveals sinus rhythm 81 bpm, otherwise normal ekg ekgs from recent hospitalization are reviewed, recent hospital records are reviewed  Assessment and Plan:'  1. Asymptomatic sinus bradycardia The patient recently was documented to have sinus bradycardia but was relatively asymptomatic at the time.  His atenolol was discontinued and his bradycardia appears to have resolved.  2. HTN Stable No change required today 2 gram sodium restriction advised  No further EP workup is necessary  He will return as needed Follow-up with primary care

## 2013-02-19 ENCOUNTER — Ambulatory Visit: Payer: Medicare Other | Admitting: Internal Medicine

## 2013-02-27 ENCOUNTER — Ambulatory Visit: Payer: Medicare Other | Admitting: Internal Medicine

## 2013-02-28 ENCOUNTER — Encounter: Payer: Self-pay | Admitting: Gastroenterology

## 2013-02-28 ENCOUNTER — Ambulatory Visit (AMBULATORY_SURGERY_CENTER): Payer: Medicare Other | Admitting: Gastroenterology

## 2013-02-28 VITALS — BP 159/73 | HR 59 | Temp 96.9°F | Resp 18 | Ht 66.0 in | Wt 214.0 lb

## 2013-02-28 DIAGNOSIS — K625 Hemorrhage of anus and rectum: Secondary | ICD-10-CM

## 2013-02-28 DIAGNOSIS — I10 Essential (primary) hypertension: Secondary | ICD-10-CM | POA: Diagnosis not present

## 2013-02-28 DIAGNOSIS — K5730 Diverticulosis of large intestine without perforation or abscess without bleeding: Secondary | ICD-10-CM

## 2013-02-28 MED ORDER — SODIUM CHLORIDE 0.9 % IV SOLN: 500.0000 mL | INTRAVENOUS | Status: DC

## 2013-02-28 NOTE — Patient Instructions (Signed)
Impressions/recommendations:  Diverticulosis (handout given) High Fiber diet (handout given)  YOU HAD AN ENDOSCOPIC PROCEDURE TODAY AT [facility]: Refer to the procedure report that was given to you for any specific questions about what was found during the examination.  If the procedure report does not answer your questions, please call your gastroenterologist to clarify.  If you requested that your care partner not be given the details of your procedure findings, then the procedure report has been included in a sealed envelope for you to review at your convenience later.  YOU SHOULD EXPECT: Some feelings of bloating in the abdomen. Passage of more gas than usual.  Walking can help get rid of the air that was put into your GI tract during the procedure and reduce the bloating. If you had a lower endoscopy (such as a colonoscopy or flexible sigmoidoscopy) you may notice spotting of blood in your stool or on the toilet paper. If you underwent a bowel prep for your procedure, then you may not have a normal bowel movement for a few days.  DIET: Your first meal following the procedure should be a light meal and then it is ok to progress to your normal diet.  A half-sandwich or bowl of soup is an example of a good first meal.  Heavy or fried foods are harder to digest and may make you feel nauseous or bloated.  Likewise meals heavy in dairy and vegetables can cause extra gas to form and this can also increase the bloating.  Drink plenty of fluids but you should avoid alcoholic beverages for 24 hours.  ACTIVITY: Your care partner should take you home directly after the procedure.  You should plan to take it easy, moving slowly for the rest of the day.  You can resume normal activity the day after the procedure however you should NOT DRIVE or use heavy machinery for 24 hours (because of the sedation medicines used during the test).    SYMPTOMS TO REPORT IMMEDIATELY: A gastroenterologist can  be reached at any hour.  During normal business hours, 8:30 AM to 5:00 PM Monday through Friday, call [phone].  After hours and on weekends, please call the GI answering service at [phone] who will take a message and have the physician on call contact you.   Following lower endoscopy (colonoscopy or flexible sigmoidoscopy):  Excessive amounts of blood in the stool  Significant tenderness or worsening of abdominal pains  Swelling of the abdomen that is new, acute  Fever of 100F or higher  Following upper endoscopy (EGD)  Vomiting of blood or coffee ground material  New chest pain or pain under the shoulder blades  Painful or persistently difficult swallowing  New shortness of breath  Fever of 100F or higher  Black, tarry-looking stools  FOLLOW UP: If any biopsies were taken you will be contacted by phone or by letter within the next 1-3 weeks.  Call your gastroenterologist if you have not heard about the biopsies in 3 weeks.  Our staff will call the home number listed on your records the next business day following your procedure to check on you and address any questions or concerns that you may have at that time regarding the information given to you following your procedure. This is a courtesy call and so if there is no answer at the home number and we have not heard from you through the emergency physician on call, we will assume that you have returned to your  regular daily activities without incident.  SIGNATURES/CONFIDENTIALITY: You and/or your care partner have signed paperwork which will be entered into your electronic medical record.  These signatures attest to the fact that that the information above on your After Visit Summary has been reviewed and is understood.  Full responsibility of the confidentiality of this discharge information lies with you and/or your care-partner.

## 2013-02-28 NOTE — Op Note (Signed)
[place]  [address] [employer]. [place], [address]   COLONOSCOPY PROCEDURE REPORT  PATIENT: Michael Ware, Michael Ware  MR#: [id number] BIRTHDATE: Oct 23, 1931 , 81  yrs. old GENDER: Male ENDOSCOPIST: Inda Castle, MD REFERRED QI:[id number] Melford Aase, M.D. PROCEDURE DATE:  02/28/2013 PROCEDURE:   Colonoscopy, diagnostic First Screening Colonoscopy - Avg.  risk and is 50 yrs.  old or older - No.  Prior Negative Screening - Now for repeat screening. N/A  History of Adenoma - Now for follow-up colonoscopy & has been > or = to 3 yrs.  N/A  Polyps Removed Today? No.  Recommend repeat exam, <10 yrs? No. ASA CLASS:   Class II INDICATIONS:hematochezia. MEDICATIONS: MAC sedation, administered by CRNA and propofol (Diprivan) 150mg  IV  DESCRIPTION OF PROCEDURE:   After the risks benefits and alternatives of the procedure were thoroughly explained, informed consent was obtained.  A digital rectal exam revealed no abnormalities of the rectum.   The LB [id number]  endoscope was introduced through the anus and advanced to the cecum, which was identified by both the appendix and ileocecal valve. No adverse events experienced.   The quality of the prep was excellent using Suprep  The instrument was then slowly withdrawn as the colon was fully examined.      COLON FINDINGS: Severe diverticulosis was noted in the sigmoid colon.   There was moderate scattered diverticulosis noted in the descending colon and transverse colon.   The colon was otherwise normal.  There was no diverticulosis, inflammation, polyps or cancers unless previously stated.  Retroflexed views revealed no abnormalities. The time to cecum=3 minutes 51 seconds.  Withdrawal time=10 minutes 43 seconds.  The scope was withdrawn and the procedure completed. COMPLICATIONS: There were no complications.  ENDOSCOPIC IMPRESSION: 1.   Severe diverticulosis was noted in the sigmoid colon 2.   There was moderate diverticulosis  noted in the descending colon and transverse colon 3.   The colon was otherwise normal  RECOMMENDATIONS: OP follow-up is advised on a PRN basis.   eSigned:  Inda Castle, MD 02/28/2013 11:46 AM   cc:   PATIENT NAME:  Michael Ware, Michael Ware MR#: [id number]

## 2013-03-03 ENCOUNTER — Telehealth: Payer: Self-pay | Admitting: *Deleted

## 2013-03-03 NOTE — Telephone Encounter (Signed)
Pt called back after I spoke with wife this morning. Pt states he has not had a bm since his colonoscopy on 02/28/13, pt denies pain, belly is non-tender and not distended, pt states he has passed gas since procedure, pt is not in any discomfort, pt states his normal bowel routine is irregular. Advised pt to walking around and drinking warm liquids are some ways to help stimulate the bowels, if he still has not had a bowel movement by tomorrow to give [place] a call and we would go from there, pt states understanding of instructions, pt will call after hours number if he develops any abd swelling, or pain.-adm

## 2013-03-03 NOTE — Telephone Encounter (Signed)
  Follow up Call-  Call back number 02/28/2013 09/20/2012  Post procedure Call Back phone  # [phone]  Permission to leave phone message Yes -     Patient questions:  Do you have a fever, pain , or abdominal swelling? no Pain Score  0 *  Have you tolerated food without any problems? yes  Have you been able to return to your normal activities? yes  Do you have any questions about your discharge instructions: Diet   no Medications  no Follow up visit  no  Do you have questions or concerns about your Care? no  Actions: * If pain score is 4 or above: No action needed, pain <4.

## 2013-06-16 DIAGNOSIS — M48061 Spinal stenosis, lumbar region without neurogenic claudication: Secondary | ICD-10-CM | POA: Diagnosis not present

## 2013-07-23 DIAGNOSIS — M48061 Spinal stenosis, lumbar region without neurogenic claudication: Secondary | ICD-10-CM | POA: Diagnosis not present

## 2013-10-07 DIAGNOSIS — L578 Other skin changes due to chronic exposure to nonionizing radiation: Secondary | ICD-10-CM | POA: Diagnosis not present

## 2013-10-07 DIAGNOSIS — L821 Other seborrheic keratosis: Secondary | ICD-10-CM | POA: Diagnosis not present

## 2013-10-07 DIAGNOSIS — D235 Other benign neoplasm of skin of trunk: Secondary | ICD-10-CM | POA: Diagnosis not present

## 2013-10-07 DIAGNOSIS — L570 Actinic keratosis: Secondary | ICD-10-CM | POA: Diagnosis not present

## 2013-11-17 DIAGNOSIS — G5702 Lesion of sciatic nerve, left lower limb: Secondary | ICD-10-CM | POA: Diagnosis not present

## 2013-11-17 DIAGNOSIS — M791 Myalgia: Secondary | ICD-10-CM | POA: Diagnosis not present

## 2013-11-17 DIAGNOSIS — G5701 Lesion of sciatic nerve, right lower limb: Secondary | ICD-10-CM | POA: Diagnosis not present

## 2013-11-18 ENCOUNTER — Ambulatory Visit (INDEPENDENT_AMBULATORY_CARE_PROVIDER_SITE_OTHER): Payer: Medicare Other | Admitting: *Deleted

## 2013-11-18 DIAGNOSIS — Z23 Encounter for immunization: Secondary | ICD-10-CM | POA: Diagnosis not present

## 2013-12-04 DIAGNOSIS — M791 Myalgia: Secondary | ICD-10-CM | POA: Diagnosis not present

## 2013-12-04 DIAGNOSIS — M5136 Other intervertebral disc degeneration, lumbar region: Secondary | ICD-10-CM | POA: Diagnosis not present

## 2013-12-04 DIAGNOSIS — M4806 Spinal stenosis, lumbar region: Secondary | ICD-10-CM | POA: Diagnosis not present

## 2013-12-04 DIAGNOSIS — G5702 Lesion of sciatic nerve, left lower limb: Secondary | ICD-10-CM | POA: Diagnosis not present

## 2013-12-11 DIAGNOSIS — G5702 Lesion of sciatic nerve, left lower limb: Secondary | ICD-10-CM | POA: Diagnosis not present

## 2013-12-11 DIAGNOSIS — M5136 Other intervertebral disc degeneration, lumbar region: Secondary | ICD-10-CM | POA: Diagnosis not present

## 2013-12-11 DIAGNOSIS — G5701 Lesion of sciatic nerve, right lower limb: Secondary | ICD-10-CM | POA: Diagnosis not present

## 2013-12-14 ENCOUNTER — Other Ambulatory Visit: Payer: Self-pay | Admitting: Internal Medicine

## 2013-12-15 ENCOUNTER — Ambulatory Visit (INDEPENDENT_AMBULATORY_CARE_PROVIDER_SITE_OTHER): Payer: Medicare Other | Admitting: *Deleted

## 2013-12-15 DIAGNOSIS — Z23 Encounter for immunization: Secondary | ICD-10-CM | POA: Diagnosis not present

## 2013-12-17 DIAGNOSIS — M791 Myalgia: Secondary | ICD-10-CM | POA: Diagnosis not present

## 2013-12-19 DIAGNOSIS — M791 Myalgia: Secondary | ICD-10-CM | POA: Diagnosis not present

## 2013-12-24 DIAGNOSIS — M791 Myalgia: Secondary | ICD-10-CM | POA: Diagnosis not present

## 2013-12-30 ENCOUNTER — Encounter: Payer: Self-pay | Admitting: Internal Medicine

## 2013-12-30 ENCOUNTER — Ambulatory Visit (INDEPENDENT_AMBULATORY_CARE_PROVIDER_SITE_OTHER): Payer: Medicare Other | Admitting: Internal Medicine

## 2013-12-30 VITALS — BP 110/68 | HR 56 | Temp 97.3°F | Resp 16 | Ht 68.25 in | Wt 207.6 lb

## 2013-12-30 DIAGNOSIS — Z79899 Other long term (current) drug therapy: Secondary | ICD-10-CM | POA: Insufficient documentation

## 2013-12-30 DIAGNOSIS — R6889 Other general symptoms and signs: Secondary | ICD-10-CM

## 2013-12-30 DIAGNOSIS — I10 Essential (primary) hypertension: Secondary | ICD-10-CM

## 2013-12-30 DIAGNOSIS — E782 Mixed hyperlipidemia: Secondary | ICD-10-CM

## 2013-12-30 DIAGNOSIS — Z125 Encounter for screening for malignant neoplasm of prostate: Secondary | ICD-10-CM | POA: Diagnosis not present

## 2013-12-30 DIAGNOSIS — Z0000 Encounter for general adult medical examination without abnormal findings: Secondary | ICD-10-CM | POA: Diagnosis not present

## 2013-12-30 DIAGNOSIS — R7309 Other abnormal glucose: Secondary | ICD-10-CM | POA: Diagnosis not present

## 2013-12-30 DIAGNOSIS — Z0001 Encounter for general adult medical examination with abnormal findings: Secondary | ICD-10-CM

## 2013-12-30 DIAGNOSIS — Z1212 Encounter for screening for malignant neoplasm of rectum: Secondary | ICD-10-CM

## 2013-12-30 DIAGNOSIS — E559 Vitamin D deficiency, unspecified: Secondary | ICD-10-CM

## 2013-12-30 DIAGNOSIS — Z9181 History of falling: Secondary | ICD-10-CM

## 2013-12-30 DIAGNOSIS — Z1331 Encounter for screening for depression: Secondary | ICD-10-CM

## 2013-12-30 LAB — CBC WITH DIFFERENTIAL/PLATELET
Basophils Absolute: 0 10*3/uL (ref 0.0–0.1)
Basophils Relative: 0 % (ref 0–1)
Eosinophils Absolute: 0.1 10*3/uL (ref 0.0–0.7)
Eosinophils Relative: 2 % (ref 0–5)
HCT: 47.6 % (ref 39.0–52.0)
Hemoglobin: 16.7 g/dL (ref 13.0–17.0)
Lymphocytes Relative: 39 % (ref 12–46)
Lymphs Abs: 2.2 10*3/uL (ref 0.7–4.0)
MCH: 35.5 pg — ABNORMAL HIGH (ref 26.0–34.0)
MCHC: 35.1 g/dL (ref 30.0–36.0)
MCV: 101.3 fL — ABNORMAL HIGH (ref 78.0–100.0)
MPV: 10.6 fL (ref 9.4–12.4)
Monocytes Absolute: 0.7 10*3/uL (ref 0.1–1.0)
Monocytes Relative: 13 % — ABNORMAL HIGH (ref 3–12)
Neutro Abs: 2.6 10*3/uL (ref 1.7–7.7)
Neutrophils Relative %: 46 % (ref 43–77)
Platelets: 217 10*3/uL (ref 150–400)
RBC: 4.7 MIL/uL (ref 4.22–5.81)
RDW: 12.9 % (ref 11.5–15.5)
WBC: 5.7 10*3/uL (ref 4.0–10.5)

## 2013-12-30 NOTE — Patient Instructions (Signed)

## 2013-12-30 NOTE — Progress Notes (Signed)
Patient ID: Michael Ware, male   DOB: [birth year], 80 y.o.   MRN: [id number]  MEDICARE ANNUAL WELLNESS & PREVENTATIVE VISIT AND CPE  Assessment:   1. Essential hypertension  - Microalbumin / creatinine urine ratio - EKG 12-Lead - [place], RETROPERITNL ABD,  LTD - TSH  2. Mixed hyperlipidemia  - Lipid panel  3. Abnormal glucose  - Hemoglobin A1c - Insulin, fasting  4. Vitamin D deficiency  - Vit D  25 hydroxy (rtn osteoporosis monitoring)  5. Screening for rectal cancer  - POC Hemoccult Bld/Stl (3-Cd Home Screen); Future  6. Screening for prostate cancer  - PSA  7. Screening for depression  - Negative  8. Medication management   9. Encounter for general adult medical examination with abnormal findings  - Urine Microscopic - CBC with Differential - BASIC METABOLIC PANEL WITH GFR - Hepatic function panel - Magnesium   Plan:   During the course of the visit the patient was educated and counseled about appropriate screening and preventive services including:    Pneumococcal vaccine   Influenza vaccine  Td vaccine  Screening electrocardiogram  Bone densitometry screening  Colorectal cancer screening  Diabetes screening  Glaucoma screening  Nutrition counseling   Advanced directives: requested  Screening recommendations, referrals: Vaccinations: DT vaccine 04/01/2004 Influenza vaccine 11/18/2013 Pneumococcal vaccine 2005 Prevnar vaccine 12/15/2013 Shingles vaccine declined Hep B vaccine not indicated  Nutrition assessed and recommended  Colonoscopy 02/28/2013 - Dr Deatra Ina Recommended yearly ophthalmology/optometry visit for glaucoma screening and checkup Recommended yearly dental visit for hygiene and checkup Advanced directives - yes  Conditions/risks identified: BMI: Discussed weight loss, diet, and increase physical activity.  Increase physical activity: AHA recommends 150 minutes of physical activity a week.  Medications  reviewed Patient is screened for Daibetes & A1c's have been WNL &  at goal, ACE/ARB therapy: not indicated Urinary Incontinence is not an issue: discussed non pharmacology and pharmacology options.  Fall risk: low- discussed PT, home fall assessment, medications.    Subjective:  Michael Ware is a 80 y.o. male who presents for Medicare Annual Wellness Visit and complete physical.  Date of last medicare wellness visit is unknown.  He has had elevated blood pressure since 2012. His blood pressure has been controlled at home, today their BP is BP: 110/68 mmHg He does not workout. He denies chest pain, shortness of breath, dizziness.  He is on cholesterol medication and denies myalgias. His cholesterol is at goal. The cholesterol last visit was:  Lab Results  Component Value Date   CHOL 140 12/24/2012   HDL 33* 12/24/2012   LDLCALC 76 12/24/2012   TRIG 154* 12/24/2012   CHOLHDL 4.2 12/24/2012   He is screened periodically and expectantly for Diabetes/Prediabetes. He has been working on diet and exercise and denies foot ulcerations, hyperglycemia, hypoglycemia , paresthesia of the feet, polydipsia, polyuria and visual disturbances. Last A1C in the office was:  Lab Results  Component Value Date   HGBA1C 5.1 01/21/2013   Patient is on Vitamin D supplement.   Lab Results  Component Value Date   VD25OH 87 12/24/2012     Names of Other Physician/Practitioners you currently use: 1. [facility] here for primary care 2. [facility], [facility] doctor, last visit - 6 mo ago 3. Dr Claudina Lick, dentist, last visit 1 week ago  Patient Care Team: Unk Pinto, MD as PCP - General (Internal Medicine) Inda Castle, MD as Consulting Physician (Gastroenterology) Thompson Grayer, MD as Consulting  Physician (Cardiology) Simona Huh, MD as Consulting Physician (Dermatology) Cindee Salt, MD as Consulting Physician (Physical Medicine and  Rehabilitation)  Medication Review: Medication Sig  . aspirin 81 MG tablet Take 81 mg by mouth every morning.   . B Complex-C (B-COMPLEX WITH VITAMIN C) tablet Take 1 tablet by mouth every morning.   . calcium carbonate (OS-CAL) 600 MG TABS tablet Take 600 mg by mouth daily with breakfast.  . Cholecalciferol (VITAMIN D3) 5000 UNITS CAPS Take 1 capsule by mouth every morning.  Marland Kitchen CRANBERRY EXTRACT PO Take 1 capsule by mouth every morning.   . Cyanocobalamin (VITAMIN B-12 CR PO) Take 1 tablet by mouth daily.  . diazepam (VALIUM) 10 MG tablet Take 10 mg by mouth 3 (three) times daily as needed.   . Garlic 10 MG CAPS Take 1 capsule by mouth every morning.   . magnesium gluconate (MAGONATE) 500 MG tablet Take 500 mg by mouth every morning.  . meloxicam (MOBIC) 15 MG tablet Take 15 mg by mouth as needed for pain.  . methocarbamol (ROBAXIN) 750 MG tablet TAKE 1 TABLET BY MOUTH 3 TIMES A DAY  . pravastatin (PRAVACHOL) 40 MG tablet Take 40 mg by mouth every morning.   . Pyridoxine HCl (VITAMIN B-6 PO) Take 1 tablet by mouth daily.  . Saw Palmetto, Serenoa repens, (SAW PALMETTO PO) Take 1 capsule by mouth every morning.   . Selenium (SELENIMIN PO) Take 1 capsule by mouth every morning.   . Zinc 50 MG CAPS Take 1 capsule by mouth every morning.      Current Problems (verified) Patient Active Problem List   Diagnosis Date Noted  . Medication management 12/30/2013  . Sinus bradycardia 02/16/2013  . Rectal bleeding 01/19/2013  . Routine general medical examination at a health care facility 12/24/2012  . Essential hypertension 12/24/2012  . Mixed hyperlipidemia 12/24/2012  . Abnormal glucose 12/24/2012  . ADD (attention deficit disorder)   . Vitamin D deficiency   . Elevated hemoglobin A1c   . BPH (benign prostatic hypertrophy) with urinary obstruction   . Diverticulosis of colon 01/23/2011    Screening Tests Health Maintenance  Topic Date Due  . ZOSTAVAX  12/08/1993  . TETANUS/TDAP   04/01/2014  . INFLUENZA VACCINE  09/07/2014  . COLONOSCOPY  03/01/2023  . PNEUMOCOCCAL POLYSACCHARIDE VACCINE AGE 66 AND OVER  Completed    Immunization History  Administered Date(s) Administered  . Influenza, High Dose Seasonal PF 11/18/2013  . Influenza-Unspecified 11/25/2012  . Pneumococcal Conjugate-13 12/15/2013  . Pneumococcal-Unspecified 02/07/2003  . Td 04/01/2004  . Varicella 12/27/2012   Preventative care: Last colonoscopy: 02/28/2013 - Dr Thomasenia Sales  Prior vaccinations: TD: 2006  Influenza: HD 11/18/2013  Pneumococcal: 2005 Shingles/Zostavax: declines  History reviewed: allergies, current medications, past family history, past medical history, past social history, past surgical history and problem list  Risk Factors: Tobacco History  Substance Use Topics  . Smoking status: Former Smoker    Quit date: 02/06/1998  . Smokeless tobacco: Former Systems developer  . Alcohol Use: 1.0 - 1.5 oz/week    2-3 drink(s) per week     Comment: couple drinks weekly   He does not smoke.  Patient is a former smoker. Are there smokers in your home (other than you)?  No  Alcohol Current alcohol use: occas beer/wine  Caffeine Current caffeine use: coffee ossac /day  Exercise Current exercise: walking  Nutrition/Diet Current diet: in general, a "healthy" diet    Cardiac risk factors: advanced age (  older than 71 for men, 15 for women), dyslipidemia, hypertension, male gender, obesity (BMI >= 30 kg/m2) and sedentary lifestyle.  Depression Screen (Note: if answer to either of the following is "Yes", a more complete depression screening is indicated)   Q1: Over the past two weeks, have you felt down, depressed or hopeless? No  Q2: Over the past two weeks, have you felt little interest or pleasure in doing things? No  Have you lost interest or pleasure in daily life? No  Do you often feel hopeless? No  Do you cry easily over simple problems? No  Activities of Daily Living In your  present state of health, do you have any difficulty performing the following activities?:  Driving? No Managing money?  No Feeding yourself? No Getting from bed to chair? No Climbing a flight of stairs? No Preparing food and eating?: No Bathing or showering? No Getting dressed: No Getting to the toilet? No Using the toilet:No Moving around from place to place: No In the past year have you fallen or had a near fall?:No   Are you sexually active?  No  Do you have more than one partner?  No  Vision Difficulties: No  Hearing Difficulties: No Do you often ask people to speak up or repeat themselves? No Do you experience ringing or noises in your ears? No Do you have difficulty understanding soft or whispered voices? No  Cognition  Do you feel that you have a problem with memory?No  Do you often misplace items? No  Do you feel safe at home?  Yes  Advanced directives Does patient have a Dwight? Yes Does patient have a Living Will? Yes   Objective:     BP110/68, pulse 56, temp 97.3 F , resp. rate 16, ht 5' 8.25" , wt 207 lb 9.6 oz  BMI   31.32  General appearance: alert, no distress, WD/WN, male Cognitive Testing  Alert? Yes  Normal Appearance? Yes  Oriented to person? Yes  Place? Yes   Time? Yes  Recall of three objects?  Yes  Can perform simple calculations? Yes  Displays appropriate judgment? Yes  Can read the correct time from a watch/clock? Yes  HEENT: normocephalic, sclerae anicteric, TMs pearly, nares patent, no discharge or erythema, pharynx normal Oral cavity: MMM, no lesions Neck: supple, no lymphadenopathy, no thyromegaly, no masses Heart: RRR, normal S1, S2, no murmurs Lungs: CTA bilaterally, no wheezes, rhonchi, or rales Abdomen: +bs, soft, non tender, non distended, no masses, no hepatomegaly, no splenomegaly Musculoskeletal: nontender, no swelling, no obvious deformity Extremities: no edema, no cyanosis, no clubbing Pulses: 2+  symmetric, upper and lower extremities, normal cap refill Neurological: alert, oriented x 3, CN2-12 intact, strength normal upper extremities and lower extremities, sensation normal throughout, DTRs 2+ throughout, no cerebellar signs, gait normal Psychiatric: normal affect, behavior normal, pleasant   Medicare Attestation I have personally reviewed: The patient's medical and social history Their use of alcohol, tobacco or illicit drugs Their current medications and supplements The patient's functional ability including ADLs,fall risks, home safety risks, cognitive, and hearing and visual impairment Diet and physical activities Evidence for depression or mood disorders  The patient's weight, height, BMI, and visual acuity have been recorded in the chart.  I have made referrals, counseling, and provided education to the patient based on review of the above and I have provided the patient with a written personalized care plan for preventive services.    Jill Ruppe DAVID, MD   12/30/2013

## 2013-12-31 DIAGNOSIS — M545 Low back pain: Secondary | ICD-10-CM | POA: Diagnosis not present

## 2013-12-31 LAB — MICROALBUMIN / CREATININE URINE RATIO
Creatinine, Urine: 215.3 mg/dL
Microalb Creat Ratio: 6 mg/g (ref 0.0–30.0)
Microalb, Ur: 1.3 mg/dL (ref <2.0)

## 2013-12-31 LAB — HEPATIC FUNCTION PANEL
ALT: 34 U/L (ref 0–53)
AST: 34 U/L (ref 0–37)
Albumin: 3.9 g/dL (ref 3.5–5.2)
Alkaline Phosphatase: 79 U/L (ref 39–117)
Bilirubin, Direct: 0.2 mg/dL (ref 0.0–0.3)
Indirect Bilirubin: 0.6 mg/dL (ref 0.2–1.2)
Total Bilirubin: 0.8 mg/dL (ref 0.2–1.2)
Total Protein: 6.3 g/dL (ref 6.0–8.3)

## 2013-12-31 LAB — BASIC METABOLIC PANEL WITH GFR
BUN: 22 mg/dL (ref 6–23)
CO2: 30 mEq/L (ref 19–32)
Calcium: 9.4 mg/dL (ref 8.4–10.5)
Chloride: 102 mEq/L (ref 96–112)
Creat: 1.22 mg/dL (ref 0.50–1.35)
GFR, Est African American: 64 mL/min
GFR, Est Non African American: 56 mL/min — ABNORMAL LOW
Glucose, Bld: 67 mg/dL — ABNORMAL LOW (ref 70–99)
Potassium: 4.9 mEq/L (ref 3.5–5.3)
Sodium: 140 mEq/L (ref 135–145)

## 2013-12-31 LAB — LIPID PANEL
Cholesterol: 140 mg/dL (ref 0–200)
HDL: 39 mg/dL — ABNORMAL LOW (ref >39)
LDL Cholesterol: 77 mg/dL (ref 0–99)
Total CHOL/HDL Ratio: 3.6 Ratio
Triglycerides: 119 mg/dL (ref <150)
VLDL: 24 mg/dL (ref 0–40)

## 2013-12-31 LAB — URINALYSIS, MICROSCOPIC ONLY
Bacteria, UA: NONE SEEN
Casts: NONE SEEN
Crystals: NONE SEEN
Squamous Epithelial / LPF: NONE SEEN

## 2013-12-31 LAB — PSA: PSA: 0.38 ng/mL (ref <=4.00)

## 2013-12-31 LAB — VITAMIN D 25 HYDROXY (VIT D DEFICIENCY, FRACTURES): Vit D, 25-Hydroxy: 80 ng/mL (ref 30–100)

## 2013-12-31 LAB — TSH: TSH: 3.964 u[IU]/mL (ref 0.350–4.500)

## 2013-12-31 LAB — HEMOGLOBIN A1C
Hgb A1c MFr Bld: 5.7 % — ABNORMAL HIGH (ref <5.7)
Mean Plasma Glucose: 117 mg/dL — ABNORMAL HIGH (ref <117)

## 2013-12-31 LAB — MAGNESIUM: Magnesium: 2 mg/dL (ref 1.5–2.5)

## 2013-12-31 LAB — INSULIN, FASTING: Insulin fasting, serum: 14.7 u[IU]/mL (ref 2.0–19.6)

## 2014-01-21 ENCOUNTER — Other Ambulatory Visit: Payer: Self-pay | Admitting: Internal Medicine

## 2014-05-21 DIAGNOSIS — S61411A Laceration without foreign body of right hand, initial encounter: Secondary | ICD-10-CM | POA: Diagnosis not present

## 2014-05-21 DIAGNOSIS — Z23 Encounter for immunization: Secondary | ICD-10-CM | POA: Diagnosis not present

## 2014-05-21 DIAGNOSIS — M545 Low back pain: Secondary | ICD-10-CM | POA: Diagnosis not present

## 2014-05-21 DIAGNOSIS — G4700 Insomnia, unspecified: Secondary | ICD-10-CM | POA: Diagnosis not present

## 2014-05-21 DIAGNOSIS — E780 Pure hypercholesterolemia: Secondary | ICD-10-CM | POA: Diagnosis not present

## 2014-07-22 ENCOUNTER — Encounter: Payer: Self-pay | Admitting: Gastroenterology

## 2014-09-16 DIAGNOSIS — M47817 Spondylosis without myelopathy or radiculopathy, lumbosacral region: Secondary | ICD-10-CM | POA: Diagnosis not present

## 2014-09-28 DIAGNOSIS — M25551 Pain in right hip: Secondary | ICD-10-CM | POA: Diagnosis not present

## 2014-09-28 DIAGNOSIS — S8991XA Unspecified injury of right lower leg, initial encounter: Secondary | ICD-10-CM | POA: Diagnosis not present

## 2014-10-05 ENCOUNTER — Encounter: Payer: Self-pay | Admitting: Physician Assistant

## 2014-10-05 ENCOUNTER — Telehealth: Payer: Self-pay | Admitting: Gastroenterology

## 2014-10-05 ENCOUNTER — Other Ambulatory Visit (INDEPENDENT_AMBULATORY_CARE_PROVIDER_SITE_OTHER): Payer: Medicare Other

## 2014-10-05 ENCOUNTER — Ambulatory Visit (INDEPENDENT_AMBULATORY_CARE_PROVIDER_SITE_OTHER): Payer: Medicare Other | Admitting: Physician Assistant

## 2014-10-05 VITALS — BP 148/76 | HR 76 | Temp 97.7°F | Ht 66.54 in | Wt 201.5 lb

## 2014-10-05 DIAGNOSIS — K921 Melena: Secondary | ICD-10-CM

## 2014-10-05 DIAGNOSIS — K5730 Diverticulosis of large intestine without perforation or abscess without bleeding: Secondary | ICD-10-CM

## 2014-10-05 DIAGNOSIS — K648 Other hemorrhoids: Secondary | ICD-10-CM

## 2014-10-05 LAB — CBC WITH DIFFERENTIAL/PLATELET
Basophils Absolute: 0 10*3/uL (ref 0.0–0.1)
Basophils Relative: 0.5 % (ref 0.0–3.0)
Eosinophils Absolute: 0.1 10*3/uL (ref 0.0–0.7)
Eosinophils Relative: 0.5 % (ref 0.0–5.0)
HCT: 48.6 % (ref 39.0–52.0)
Hemoglobin: 16.7 g/dL (ref 13.0–17.0)
Lymphocytes Relative: 17.7 % (ref 12.0–46.0)
Lymphs Abs: 1.8 10*3/uL (ref 0.7–4.0)
MCHC: 34.5 g/dL (ref 30.0–36.0)
MCV: 105.2 fl — ABNORMAL HIGH (ref 78.0–100.0)
Monocytes Absolute: 0.8 10*3/uL (ref 0.1–1.0)
Monocytes Relative: 7.8 % (ref 3.0–12.0)
Neutro Abs: 7.5 10*3/uL (ref 1.4–7.7)
Neutrophils Relative %: 73.5 % (ref 43.0–77.0)
Platelets: 248 10*3/uL (ref 150.0–400.0)
RBC: 4.62 Mil/uL (ref 4.22–5.81)
RDW: 13.5 % (ref 11.5–15.5)
WBC: 10.1 10*3/uL (ref 4.0–10.5)

## 2014-10-05 MED ORDER — HYDROCORTISONE ACETATE 25 MG RE SUPP: 25.0000 mg | Freq: Two times a day (BID) | RECTAL | Status: DC

## 2014-10-05 MED ORDER — MENTHOL-ZINC OXIDE 0.44-20.6 % EX OINT: TOPICAL_OINTMENT | CUTANEOUS | Status: DC

## 2014-10-05 NOTE — Patient Instructions (Addendum)
Your physician has requested that you go to the basement for the following lab work before leaving today:CBC.  We have printed prescriptions for you to take to your pharmacy.   Use Tuck wipes and call immediately if you have any significant bleeding.   Your follow up with Arta Bruce, PA is on 10/19/14 at 1:15pm. If you need to cancel or reschedule please call our office at [phone].

## 2014-10-05 NOTE — Progress Notes (Signed)
Patient ID: Michael Ware, male   DOB: May 08, 1931, 81 y.o.   MRN: [id number]     History of Present Illness: Michael Ware is a pleasant 81 year old male who is known to Dr. Deatra Ina. He was admitted to the hospital in December 2014 with an acute lower GI bleed that was felt to be diverticular. 2010 colonoscopy demonstrated diffuse diverticulosis. His last colonoscopy was in January 2015 at which time severe diverticulosis was noted in the sigmoid colon. There was moderate diverticulosis noted in the descending colon and transverse colon. The colon was otherwise normal. He presents today with complaints of rectal bleeding. He states that approximately 2-1/2 weeks ago he was constipated and was skipping 3 days between bowel movements and then passing hard stools. He then went on a hiking trip to [place], and on August 18 cell while hiking in [place], injuring his right hip and thigh. He had severe bruising and pain and was seen at [facility] last Monday. X-rays were negative for fracture. His bruising of his right hip and thigh have been improving. He reports that since Monday the 22nd he has been having blood with his bowel movements. He states he had one bowel movement daily from the 22nd until this morning. With each bowel movement he would have blood on the toilet tissue and some blood dripping in his the commode, but no clots. At no time did he have abdominal pain. He states last night he passed gas and felt something wet in his underwear. When he looked he had blood in his underwear. He walked to the bathroom to clean up but his wife noted that he had had some blood dripping on the kitchen floor. He had no further bleeding last night, but this morning had a loose bowel movement that was mainly brown but had some blood. He has no sensation of incomplete evacuation but complains of anal burning and soreness. He feels the area around his rectum is very irritated. He denies a sensation of incomplete  evacuation. He has no lightheadedness or dizziness.   Past Medical History  Diagnosis Date  . Diverticulosis   . Hypertension   . Hyperlipidemia   . Vitamin D deficiency   . Elevated hemoglobin A1c   . BPH (benign prostatic hypertrophy) with urinary obstruction   . Bradycardia   . Rectal bleed 01/19/13  . Hyperglycemia   . Acute GI bleeding 01/19/13    most likely due to diverticulosis    Past Surgical History  Procedure Laterality Date  . Rotator cuff repair      bilateral  . Rectal fissure procedure  1970's  . Tonsillectomy    . Anal fissure repair  1980   Family History  Problem Relation Age of Onset  . Colon cancer Neg Hx   . Heart disease Mother   . Heart disease Father    Social History  Substance Use Topics  . Smoking status: Former Smoker    Quit date: 02/06/1998  . Smokeless tobacco: Former Systems developer  . Alcohol Use: 1.0 - 1.5 oz/week    2-3 drink(s) per week     Comment: couple drinks weekly   Current Outpatient Prescriptions  Medication Sig Dispense Refill  . aspirin 81 MG tablet Take 81 mg by mouth every morning.     . B Complex-C (B-COMPLEX WITH VITAMIN C) tablet Take 1 tablet by mouth every morning.     . calcium carbonate (OS-CAL) 600 MG TABS tablet Take 600 mg by mouth daily with  breakfast.    . Cholecalciferol (VITAMIN D3) 5000 UNITS CAPS Take 1 capsule by mouth every morning.    Marland Kitchen CRANBERRY EXTRACT PO Take 1 capsule by mouth every morning.     . Cyanocobalamin (VITAMIN B-12 CR PO) Take 1 tablet by mouth daily.    . cyclobenzaprine (FLEXERIL) 10 MG tablet Take 10 mg by mouth 3 (three) times daily as needed.  0  . diazepam (VALIUM) 10 MG tablet Take 10 mg by mouth 3 (three) times daily as needed.     . Garlic 10 MG CAPS Take 1 capsule by mouth every morning.     . hydrocortisone (ANUSOL-HC) 25 MG suppository Place 1 suppository (25 mg total) rectally 2 (two) times daily. 20 suppository 1  . lidocaine (LIDODERM) 5 %   0  . magnesium gluconate (MAGONATE)  500 MG tablet Take 500 mg by mouth every morning.    . meloxicam (MOBIC) 15 MG tablet Take 15 mg by mouth as needed for pain.    . Menthol-Zinc Oxide (CALMOSEPTINE) 0.44-20.6 % OINT Apply to external perirectal area 2-3 x daily x 2 weeks 30 g 0  . methocarbamol (ROBAXIN) 750 MG tablet TAKE 1 TABLET BY MOUTH 3 TIMES A DAY 270 tablet 1  . pravastatin (PRAVACHOL) 40 MG tablet Take 40 mg by mouth every morning.     . Pyridoxine HCl (VITAMIN B-6 PO) Take 1 tablet by mouth daily.    . Saw Palmetto, Serenoa repens, (SAW PALMETTO PO) Take 1 capsule by mouth every morning.     . Selenium (SELENIMIN PO) Take 1 capsule by mouth every morning.     . Zinc 50 MG CAPS Take 1 capsule by mouth every morning.      No current facility-administered medications for this visit.   Allergies  Allergen Reactions  . Trazamine [Trazodone & Diet Manage Prod]     Dry mouth     Review of Systems: Gen: Denies any fever, chills, sweats, anorexia, fatigue, weakness, malaise, weight loss, and sleep disorder CV: Denies chest pain, angina, palpitations, syncope, orthopnea, PND, peripheral edema, and claudication. Resp: Denies dyspnea at rest, dyspnea with exercise, cough, sputum, wheezing, coughing up blood, and pleurisy. GI: Denies vomiting blood, jaundice, and fecal incontinence.   Denies dysphagia or odynophagia. GU : Denies urinary burning, blood in urine, urinary frequency, urinary hesitancy, nocturnal urination, and urinary incontinence. MS: Denies joint pain, limitation of movement, and swelling, stiffness, low back pain, extremity pain. Denies muscle weakness, cramps, atrophy.  Derm: Denies rash, itching, dry skin, hives, moles, warts, or unhealing ulcers.  Psych: Denies depression, anxiety, memory loss, suicidal ideation, hallucinations, paranoia, and confusion. Heme: Denies bruising, bleeding, and enlarged lymph nodes. Neuro:  Denies any headaches, dizziness, paresthesia Endo:  Denies any problems with DM,  thyroid, adrenal  \  Physical Exam: BP 148/76 mmHg  Pulse 76  Temp(Src) 97.7 F (36.5 C)  Ht 5' 6.53" (1.69 m)  Wt 201 lb 8 oz (91.4 kg)  BMI 32.00 kg/m2  SpO2  General: Pleasant, well developed male in no acute distress Head: Normocephalic and atraumatic Eyes:  sclerae anicteric, conjunctiva pink  Ears: Normal auditory acuity Lungs: Clear throughout to auscultation Heart: Regular rate and rhythm Abdomen: Soft, non distended, non-tender. No masses, no hepatomegaly. Normal bowel sounds Rectal: The perianal area is red and raw but not macerated. Anoscopy reveals internal hemorrhoids, left sided actively bleeding. There is a small dark clot as well. Musculoskeletal: Symmetrical with no gross deformities  Extremities: No edema  Neurological: Alert oriented x 4, grossly nonfocal Psychological:  Alert and cooperative. Normal mood and affect  Assessment and Recommendations: 81 year old male with a history of severe diverticular disease presenting with a week of hematochezia. Bleeding likely hemorrhoidal in nature. He is not orthostatic. He will be given a trial of Anusol HC suppositories 1 per rectum twice a day for 10 days. He has been instructed to use Tucks wipes. He will use calmoseptine ointment to the perirectal area 2-3 times daily for 2 weeks to form a barrier. A CBC will be obtained today. Patient was instructed that should he develop significant bleeding, passed bright red blood per rectum with clots, or feel dizzy or lightheaded, he should go to the emergency room immediately as well as contact us. He will follow up in 2 weeks, sooner if needed. Patient may be a candidate for hemorrhoidal banding if bleeding continues to be a problem.        Monda Chastain, Deloris Ping 10/05/2014,

## 2014-10-05 NOTE — Telephone Encounter (Signed)
He has had 2 days of dripping painless red blood from the rectum. He has passed stool when trying to pass gas. He is very concerned and really wants a doctor to look. Appointment made.

## 2014-10-09 ENCOUNTER — Telehealth: Payer: Self-pay | Admitting: Physician Assistant

## 2014-10-10 ENCOUNTER — Telehealth: Payer: Self-pay | Admitting: Internal Medicine

## 2014-10-10 NOTE — Telephone Encounter (Signed)
Pt has been having more rectal bleeding today, started after lunch Seen recently by Cecille Rubin in our office and dx with bleeding internal hemorrhoids.  He has been using analpram and hydrocortisone suppository since this visit Bleeding had slowed.  Today felt the urge to pass gas and passed semi-solid stool and bright red blood in the toilet Has had 1 additional episode of bloody diarrhea since this No pain, no presyncopal symptoms, no fever, and no chills DDx: bleeding internal hemorrhoids as before or new diverticular hemorrhage He took 1 imodium about 30 min ago Plan: if any symptoms develop, pain, dizziness, palpitations, dyspnea, chest pain, presyncope, syncope he is to call 911, if he has 1 more episode of bleeding he is instructed to go to the ED He does not want to be admitted, but I explained this is the safest course of action if bleeding recurs He voices understanding Thanked me for the call If bleeding resolves, possible banding candidate with Dr. Deatra Ina, hopefully sooner than later

## 2014-10-13 ENCOUNTER — Telehealth: Payer: Self-pay | Admitting: Gastroenterology

## 2014-10-13 NOTE — Progress Notes (Signed)
Reviewed and agree with management. Mihailo Sage D. Jeronimo Hellberg, M.D., FACG  

## 2014-10-13 NOTE — Telephone Encounter (Signed)
I spoke with the patient.  He reports "massive bleeding and that I have overdosed myself on imodium to stop the bleeding".  Patient reports that he passed 9-10 "very large, massive" episodes of blood independent of stool on Saturday and Sunday. See phone note from Dr. Hilarie Fredrickson over the weekend.  Patient did not go to the ER and wants to be seen by "a doctor only".  Patient states that he has only a small amount of bleeding today " a few spots on a sanitary napkin".  He is again offered an appt for tomorrow with APP , he adamantly refuses.  "I only want to see a doctor".   I attempted to explain that he should see APP and he began to yell at me that he would see a doctor only.  I recommended he go to the ER if he feels that the appt for Dr. Deatra Ina on 10/21/1608:30 is too far a way and he has any more bleeding at all.  He states "I'm not going there and sitting in my own juices for 3 days".  I did give him the appt with Dr. Deatra Ina for 10/22/14 10:30 and reiterated the need to care at the ER for any more bleeding.

## 2014-10-13 NOTE — Telephone Encounter (Signed)
Spoke with patient and he states he called on Friday and did not get a return call. He states he is bleeding and needs to be seen NOW. He wants to see Dr. Deatra Ina. Explained that he does not have any appointments today. Offered OV with Tye Savoy, NP tomorrow. Patient became angry and states he wants to be seen here with Dr. Deatra Ina because he is bleeding. Patient told to go to ED if bleeding but he states he does not want to go there. Wants to speak with someone else.

## 2014-10-13 NOTE — Telephone Encounter (Signed)
See telephone note for 10/09/14. Patient has spoken with nurse and is scheduled. Declines ER

## 2014-10-15 DIAGNOSIS — S8991XD Unspecified injury of right lower leg, subsequent encounter: Secondary | ICD-10-CM | POA: Diagnosis not present

## 2014-10-15 DIAGNOSIS — M25551 Pain in right hip: Secondary | ICD-10-CM | POA: Diagnosis not present

## 2014-10-16 NOTE — Telephone Encounter (Signed)
Spoke with patient. He has had a good week until last night. He began having the rectal blood again. He took 4 Imodium last night and 1 this morning. Not bleeding now. He denies abdominal pain, near syncope, nausea or indigestion. Rectal area is tender. He had trouble using the suppositories in the beginning, but is able now. Declines ER. He will keep the upcoming appointment.

## 2014-10-19 ENCOUNTER — Ambulatory Visit: Payer: Medicare Other | Admitting: Physician Assistant

## 2014-10-22 ENCOUNTER — Encounter: Payer: Self-pay | Admitting: Gastroenterology

## 2014-10-22 ENCOUNTER — Ambulatory Visit (INDEPENDENT_AMBULATORY_CARE_PROVIDER_SITE_OTHER): Payer: Medicare Other | Admitting: Gastroenterology

## 2014-10-22 VITALS — BP 100/64 | HR 70 | Ht 66.5 in | Wt 205.2 lb

## 2014-10-22 DIAGNOSIS — K648 Other hemorrhoids: Secondary | ICD-10-CM | POA: Diagnosis not present

## 2014-10-22 NOTE — Progress Notes (Signed)
      History of Present Illness:  [name] continues to pass bright red blood per rectum.  He's had intermittent painless  rectal bleeding.  Bleeding has bladder the toilet bowl and the water.  In between he has had normal bowel movements.  Recent endoscopy demonstrated a bleeding hemorrhoid.    Review of Systems: Pertinent positive and negative review of systems were noted in the above HPI section. All other review of systems were otherwise negative.    Current Medications, Allergies, Past Medical History, Past Surgical History, Family History and Social History were reviewed in [facility] medical record  PROCEDURE NOTE: The patient presents with symptomatic grade *2**  hemorrhoids, requesting rubber band ligation of his/her hemorrhoidal disease.  All risks, benefits and alternative forms of therapy were described and informed consent was obtained.   The anorectum was pre-medicated with lubricant and nitroglycerine ointment The decision was made to band the *left anterior** internal hemorrhoid, and the [place] was used to perform band ligation without complication.  Digital anorectal examination was then performed to assure proper positioning of the band, and to adjust the banded tissue as required.  The patient was discharged home without pain or other issues.  Dietary and behavioral recommendations were given and along with follow-up instructions.    The patient will return in **4* weeks for  follow-up and possible additional banding as required. No complications were encountered and the patient tolerated the procedure well.

## 2014-10-22 NOTE — Patient Instructions (Addendum)
HEMORRHOID BANDING PROCEDURE    FOLLOW-UP CARE   1. The procedure you have had should have been relatively painless since the banding of the area involved does not have nerve endings and there is no pain sensation.  The rubber band cuts off the blood supply to the hemorrhoid and the band may fall off as soon as 48 hours after the banding (the band may occasionally be seen in the toilet bowl following a bowel movement). You may notice a temporary feeling of fullness in the rectum which should respond adequately to plain Tylenol or Motrin.  2. Following the banding, avoid strenuous exercise that evening and resume full activity the next day.  A sitz bath (soaking in a warm tub) or bidet is soothing, and can be useful for cleansing the area after bowel movements.     3. To avoid constipation, take two tablespoons of natural wheat bran, natural oat bran, flax, Benefiber or any over the counter fiber supplement and increase your water intake to 7-8 glasses daily.    4. Unless you have been prescribed anorectal medication, do not put anything inside your rectum for two weeks: No suppositories, enemas, fingers, etc.  5. Occasionally, you may have more bleeding than usual after the banding procedure.  This is often from the untreated hemorrhoids rather than the treated one.  Don't be concerned if there is a tablespoon or so of blood.  If there is more blood than this, lie flat with your bottom higher than your head and apply an ice pack to the area. If the bleeding does not stop within a half an hour or if you feel faint, call our office at [phone] or go to the emergency room.  6. Problems are not common; however, if there is a substantial amount of bleeding, severe pain, chills, fever or difficulty passing urine (very rare) or other problems, you should call us at [phone] or report to the nearest emergency room.  7. Do not stay seated continuously for more than 2-3 hours for a day or two  after the procedure.  Tighten your buttock muscles 10-15 times every two hours and take 10-15 deep breaths every 1-2 hours.  Do not spend more than a few minutes on the toilet if you cannot empty your bowel; instead re-visit the toilet at a later time.   Your 2nd banding is scheduled on 12/10/2014 at 9:45am

## 2014-11-03 ENCOUNTER — Telehealth: Payer: Self-pay | Admitting: Gastroenterology

## 2014-11-03 NOTE — Telephone Encounter (Signed)
Spoke with pt and he states he is having a lot of rectal bleeding. Discussed with pt that he should go to the ER to be evaluated. Pt very rude on the phone, states we just do not know what it is like to lay in the ER in a cubicle and "stew in your own juices" and for them to wake you up to take your BP. Pt states he wants to see Dr. Deatra Ina and be banded again. Reviewed with pt again that Dr. Deatra Ina is not in the office. Offered appt with APP next week, pt refused, wants to see Dr. Deatra Ina. Pt scheduled to see Dr. Deatra Ina 11/20/14@2 :30pm. Pt has had bleeding on several occasions and has been referred to the ER but refuses to go.

## 2014-11-12 ENCOUNTER — Telehealth: Payer: Self-pay | Admitting: Gastroenterology

## 2014-11-12 NOTE — Telephone Encounter (Signed)
yes

## 2014-11-12 NOTE — Telephone Encounter (Signed)
This patient has had 1 banding. He was scheduled for a follow up and then later another banding. Would you be willing to take him as your patient?

## 2014-11-12 NOTE — Telephone Encounter (Signed)
Left message for the patient on his voicemail. He will be contacted by someone to reschedule his appointment to Dr Carlean Purl.

## 2014-11-13 DIAGNOSIS — M7061 Trochanteric bursitis, right hip: Secondary | ICD-10-CM | POA: Diagnosis not present

## 2014-11-13 DIAGNOSIS — M7062 Trochanteric bursitis, left hip: Secondary | ICD-10-CM | POA: Diagnosis not present

## 2014-11-13 DIAGNOSIS — S8991XD Unspecified injury of right lower leg, subsequent encounter: Secondary | ICD-10-CM | POA: Diagnosis not present

## 2014-11-19 ENCOUNTER — Ambulatory Visit (INDEPENDENT_AMBULATORY_CARE_PROVIDER_SITE_OTHER): Payer: Medicare Other | Admitting: Internal Medicine

## 2014-11-19 ENCOUNTER — Encounter: Payer: Self-pay | Admitting: Internal Medicine

## 2014-11-19 VITALS — BP 134/76 | HR 76 | Ht 66.5 in | Wt 197.5 lb

## 2014-11-19 DIAGNOSIS — K648 Other hemorrhoids: Secondary | ICD-10-CM | POA: Diagnosis not present

## 2014-11-19 HISTORY — DX: Other hemorrhoids: K64.8

## 2014-11-19 NOTE — Progress Notes (Signed)
   PROCEDURE NOTE: The patient presents with symptomatic grade 2  hemorrhoids, requesting rubber band ligation of his/her hemorrhoidal disease.  All risks, benefits and alternative forms of therapy were described and informed consent was obtained.  In the Left Lateral Decubitus position anoscopic examination revealed grade 2 hemorrhoids in the RP and RA position(s).  The anorectum was pre-medicated with NTG and lidocaine topical The decision was made to band the RP and RA internal hemorrhoid, and the [place] was used to perform band ligation without complication.  Digital anorectal examination was then performed to assure proper positioning of the band, and to adjust the banded tissue as required.  The patient was discharged home without pain or other issues.  Dietary and behavioral recommendations were given and along with follow-up instructions.      The patient will return in 2 months for  follow-up and possible additional banding as required. No complications were encountered and the patient tolerated the procedure well.  I appreciate the opportunity to care for this patient.  ME:QAST Alroy Dust, MD

## 2014-11-19 NOTE — Patient Instructions (Addendum)
HEMORRHOID BANDING PROCEDURE    FOLLOW-UP CARE   1. The procedure you have had should have been relatively painless since the banding of the area involved does not have nerve endings and there is no pain sensation.  The rubber band cuts off the blood supply to the hemorrhoid and the band may fall off as soon as 48 hours after the banding (the band may occasionally be seen in the toilet bowl following a bowel movement). You may notice a temporary feeling of fullness in the rectum which should respond adequately to plain Tylenol or Motrin.  2. Following the banding, avoid strenuous exercise that evening and resume full activity the next day.  A sitz bath (soaking in a warm tub) or bidet is soothing, and can be useful for cleansing the area after bowel movements.     3. To avoid constipation, take two tablespoons of natural wheat bran, natural oat bran, flax, Benefiber or any over the counter fiber supplement and increase your water intake to 7-8 glasses daily.    4. Unless you have been prescribed anorectal medication, do not put anything inside your rectum for two weeks: No suppositories, enemas, fingers, etc.  5. Occasionally, you may have more bleeding than usual after the banding procedure.  This is often from the untreated hemorrhoids rather than the treated one.  Don't be concerned if there is a tablespoon or so of blood.  If there is more blood than this, lie flat with your bottom higher than your head and apply an ice pack to the area. If the bleeding does not stop within a half an hour or if you feel faint, call our office at [phone] or go to the emergency room.  6. Problems are not common; however, if there is a substantial amount of bleeding, severe pain, chills, fever or difficulty passing urine (very rare) or other problems, you should call us at [phone] or report to the nearest emergency room.  7. Do not stay seated continuously for more than 2-3 hours for a day or two  after the procedure.  Tighten your buttock muscles 10-15 times every two hours and take 10-15 deep breaths every 1-2 hours.  Do not spend more than a few minutes on the toilet if you cannot empty your bowel; instead re-visit the toilet at a later time.    We will see you at your follow up visit.    I appreciate the opportunity to care for you. Silvano Rusk, MD, [facility]

## 2014-11-20 ENCOUNTER — Ambulatory Visit: Payer: Medicare Other | Admitting: Gastroenterology

## 2014-11-20 DIAGNOSIS — Z23 Encounter for immunization: Secondary | ICD-10-CM | POA: Diagnosis not present

## 2014-11-20 DIAGNOSIS — D489 Neoplasm of uncertain behavior, unspecified: Secondary | ICD-10-CM | POA: Diagnosis not present

## 2014-11-20 DIAGNOSIS — G4700 Insomnia, unspecified: Secondary | ICD-10-CM | POA: Diagnosis not present

## 2014-11-20 DIAGNOSIS — E7800 Pure hypercholesterolemia, unspecified: Secondary | ICD-10-CM | POA: Diagnosis not present

## 2014-11-20 DIAGNOSIS — K625 Hemorrhage of anus and rectum: Secondary | ICD-10-CM | POA: Diagnosis not present

## 2014-11-20 NOTE — Assessment & Plan Note (Signed)
Prior LL banding - slight bleeding x 1 day after RP and RA banded today RTC 2 months

## 2014-12-01 DIAGNOSIS — L570 Actinic keratosis: Secondary | ICD-10-CM | POA: Diagnosis not present

## 2014-12-01 DIAGNOSIS — C44722 Squamous cell carcinoma of skin of right lower limb, including hip: Secondary | ICD-10-CM | POA: Diagnosis not present

## 2014-12-01 DIAGNOSIS — D0471 Carcinoma in situ of skin of right lower limb, including hip: Secondary | ICD-10-CM | POA: Diagnosis not present

## 2014-12-10 ENCOUNTER — Encounter: Payer: Medicare Other | Admitting: Gastroenterology

## 2014-12-28 DIAGNOSIS — D7589 Other specified diseases of blood and blood-forming organs: Secondary | ICD-10-CM | POA: Diagnosis not present

## 2015-01-19 ENCOUNTER — Encounter: Payer: Self-pay | Admitting: Internal Medicine

## 2015-01-25 ENCOUNTER — Encounter: Payer: Self-pay | Admitting: Internal Medicine

## 2015-01-25 ENCOUNTER — Ambulatory Visit (INDEPENDENT_AMBULATORY_CARE_PROVIDER_SITE_OTHER): Payer: Medicare Other | Admitting: Internal Medicine

## 2015-01-25 VITALS — BP 130/72 | HR 72 | Ht 66.5 in | Wt 201.0 lb

## 2015-01-25 DIAGNOSIS — K648 Other hemorrhoids: Secondary | ICD-10-CM

## 2015-01-25 NOTE — Patient Instructions (Signed)
  HEMORRHOID BANDING PROCEDURE    FOLLOW-UP CARE   1. The procedure you have had should have been relatively painless since the banding of the area involved does not have nerve endings and there is no pain sensation.  The rubber band cuts off the blood supply to the hemorrhoid and the band may fall off as soon as 48 hours after the banding (the band may occasionally be seen in the toilet bowl following a bowel movement). You may notice a temporary feeling of fullness in the rectum which should respond adequately to plain Tylenol or Motrin.  2. Following the banding, avoid strenuous exercise that evening and resume full activity the next day.  A sitz bath (soaking in a warm tub) or bidet is soothing, and can be useful for cleansing the area after bowel movements.     3. To avoid constipation, take two tablespoons of natural wheat bran, natural oat bran, flax, Benefiber or any over the counter fiber supplement and increase your water intake to 7-8 glasses daily.    4. Unless you have been prescribed anorectal medication, do not put anything inside your rectum for two weeks: No suppositories, enemas, fingers, etc.  5. Occasionally, you may have more bleeding than usual after the banding procedure.  This is often from the untreated hemorrhoids rather than the treated one.  Don't be concerned if there is a tablespoon or so of blood.  If there is more blood than this, lie flat with your bottom higher than your head and apply an ice pack to the area. If the bleeding does not stop within a half an hour or if you feel faint, call our office at [phone] or go to the emergency room.  6. Problems are not common; however, if there is a substantial amount of bleeding, severe pain, chills, fever or difficulty passing urine (very rare) or other problems, you should call us at [phone] or report to the nearest emergency room.  7. Do not stay seated continuously for more than 2-3 hours for a day or  two after the procedure.  Tighten your buttock muscles 10-15 times every two hours and take 10-15 deep breaths every 1-2 hours.  Do not spend more than a few minutes on the toilet if you cannot empty your bowel; instead re-visit the toilet at a later time.    Follow up as needed with Dr Carlean Purl.   I appreciate the opportunity to care for you. Silvano Rusk, MD, [facility]

## 2015-01-25 NOTE — Assessment & Plan Note (Signed)
RP still prominent on anoscopy so banded

## 2015-01-25 NOTE — Progress Notes (Signed)
   Subjective:    Patient ID: Michael Ware, male    DOB: 04/10/1931, 81 y.o.   MRN: [id number] Complaint: Follow-up of hemorrhoids HPI  The patient has had all 3 columns of his hemorrhoids banded, last visit was about 2 months ago. He reports significant improvement but he still has some rectal bleeding. I think the fecal soiling is much better. Medications, allergies, past medical history, past surgical history, family history and social history are reviewed and updated in the EMR.  Review of Systems As above    Objective:   Physical Exam BP 130/72 mmHg  Pulse 72  Ht 5' 6.5" (1.689 m)  Wt 201 lb (91.173 kg)  BMI 31.96 kg/m2   Anoscopy was performed with the patient in the left lateral decubitus position while a chaperone was present and revealed Gr 2 RP inflamed internal hemorrhoid Gr 1 LL and RA   PROCEDURE NOTE: The patient presents with symptomatic grade 2 hemorrhoids, requesting rubber band ligation of his/her hemorrhoidal disease.  All risks, benefits and alternative forms of therapy were described and informed consent was obtained.  The decision was made to band the RP internal hemorrhoid, and the [place] was used to perform band ligation without complication.  Digital anorectal examination was then performed to assure proper positioning of the band, and to adjust the banded tissue as required.  The patient was discharged home without pain or other issues.  Dietary and behavioral recommendations were given and along with follow-up instructions.     The following adjunctive treatments were recommended:  Fiber  The patient will return in prn for  follow-up and possible additional banding as required. No complications were encountered and the patient tolerated the procedure well.       Assessment & Plan:  Internal hemorrhoids with bleeding and fecal soiling RP still prominent on anoscopy so banded  I appreciate the opportunity to care for this  patient. CC: Donnie Coffin, MD

## 2015-01-29 DIAGNOSIS — J029 Acute pharyngitis, unspecified: Secondary | ICD-10-CM | POA: Diagnosis not present

## 2015-02-15 DIAGNOSIS — Z85828 Personal history of other malignant neoplasm of skin: Secondary | ICD-10-CM | POA: Diagnosis not present

## 2015-02-15 DIAGNOSIS — L570 Actinic keratosis: Secondary | ICD-10-CM | POA: Diagnosis not present

## 2015-02-15 DIAGNOSIS — L905 Scar conditions and fibrosis of skin: Secondary | ICD-10-CM | POA: Diagnosis not present

## 2015-06-23 DIAGNOSIS — M5136 Other intervertebral disc degeneration, lumbar region: Secondary | ICD-10-CM | POA: Diagnosis not present

## 2015-06-23 DIAGNOSIS — M7062 Trochanteric bursitis, left hip: Secondary | ICD-10-CM | POA: Diagnosis not present

## 2015-06-30 DIAGNOSIS — M5136 Other intervertebral disc degeneration, lumbar region: Secondary | ICD-10-CM | POA: Diagnosis not present

## 2015-07-04 ENCOUNTER — Emergency Department (HOSPITAL_COMMUNITY)
Admission: EM | Admit: 2015-07-04 | Discharge: 2015-07-04 | Disposition: A | Discharge status: Home / Self Care | Payer: Medicare Other | Attending: Emergency Medicine | Admitting: Emergency Medicine

## 2015-07-04 ENCOUNTER — Emergency Department (HOSPITAL_COMMUNITY): Payer: Medicare Other

## 2015-07-04 ENCOUNTER — Encounter (HOSPITAL_COMMUNITY): Payer: Self-pay

## 2015-07-04 DIAGNOSIS — Z79899 Other long term (current) drug therapy: Secondary | ICD-10-CM | POA: Diagnosis not present

## 2015-07-04 DIAGNOSIS — M47816 Spondylosis without myelopathy or radiculopathy, lumbar region: Secondary | ICD-10-CM | POA: Diagnosis not present

## 2015-07-04 DIAGNOSIS — S50311A Abrasion of right elbow, initial encounter: Secondary | ICD-10-CM | POA: Insufficient documentation

## 2015-07-04 DIAGNOSIS — Y939 Activity, unspecified: Secondary | ICD-10-CM | POA: Diagnosis not present

## 2015-07-04 DIAGNOSIS — Z7982 Long term (current) use of aspirin: Secondary | ICD-10-CM | POA: Diagnosis not present

## 2015-07-04 DIAGNOSIS — S50812A Abrasion of left forearm, initial encounter: Secondary | ICD-10-CM | POA: Diagnosis not present

## 2015-07-04 DIAGNOSIS — S40819A Abrasion of unspecified upper arm, initial encounter: Secondary | ICD-10-CM

## 2015-07-04 DIAGNOSIS — Y929 Unspecified place or not applicable: Secondary | ICD-10-CM | POA: Diagnosis not present

## 2015-07-04 DIAGNOSIS — Y999 Unspecified external cause status: Secondary | ICD-10-CM | POA: Insufficient documentation

## 2015-07-04 DIAGNOSIS — Z87891 Personal history of nicotine dependence: Secondary | ICD-10-CM | POA: Diagnosis not present

## 2015-07-04 DIAGNOSIS — I10 Essential (primary) hypertension: Secondary | ICD-10-CM | POA: Insufficient documentation

## 2015-07-04 DIAGNOSIS — W19XXXA Unspecified fall, initial encounter: Secondary | ICD-10-CM

## 2015-07-04 DIAGNOSIS — W010XXA Fall on same level from slipping, tripping and stumbling without subsequent striking against object, initial encounter: Secondary | ICD-10-CM | POA: Diagnosis not present

## 2015-07-04 DIAGNOSIS — M545 Low back pain, unspecified: Secondary | ICD-10-CM

## 2015-07-04 IMAGING — CR DG LUMBAR SPINE COMPLETE 4+V
5 series · 5 of 5 positions shown · non-contrast
Comparison: None.

CLINICAL DATA: Low back pain

EXAM:
LUMBAR SPINE - COMPLETE 4+ VIEW

[l-spine obl (1 of 2)]
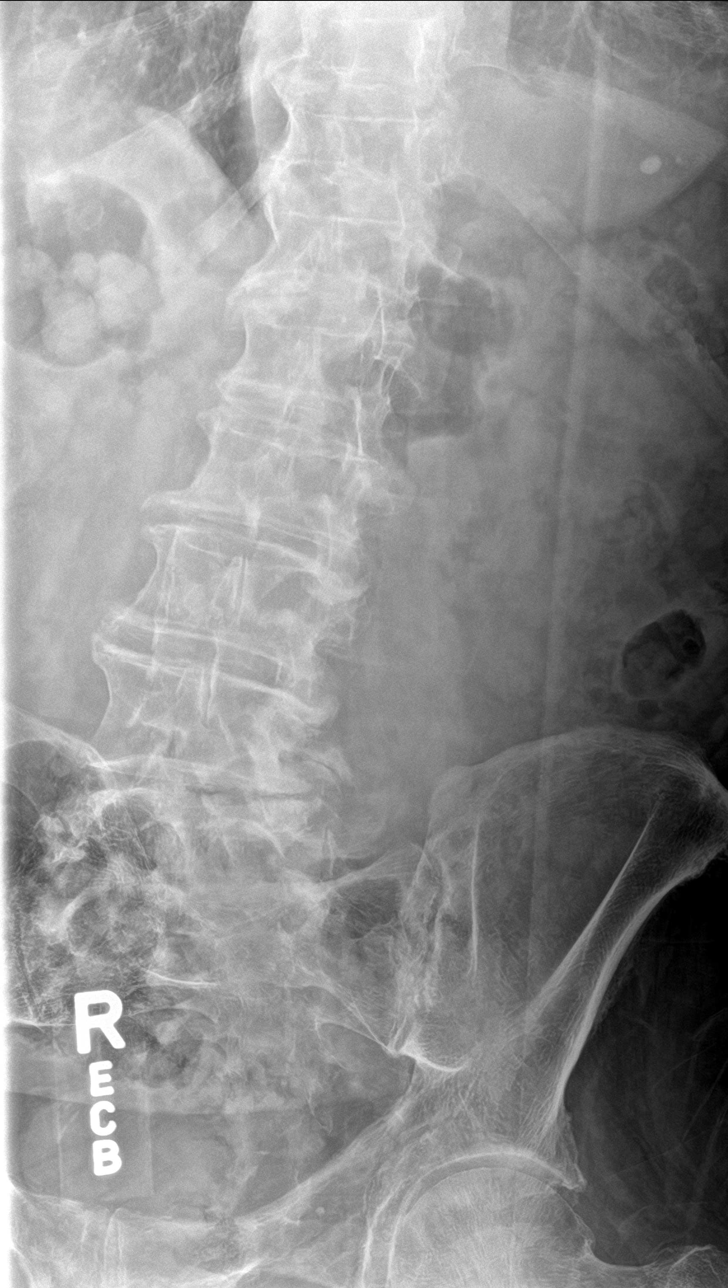

[l-spine obl (2 of 2)]
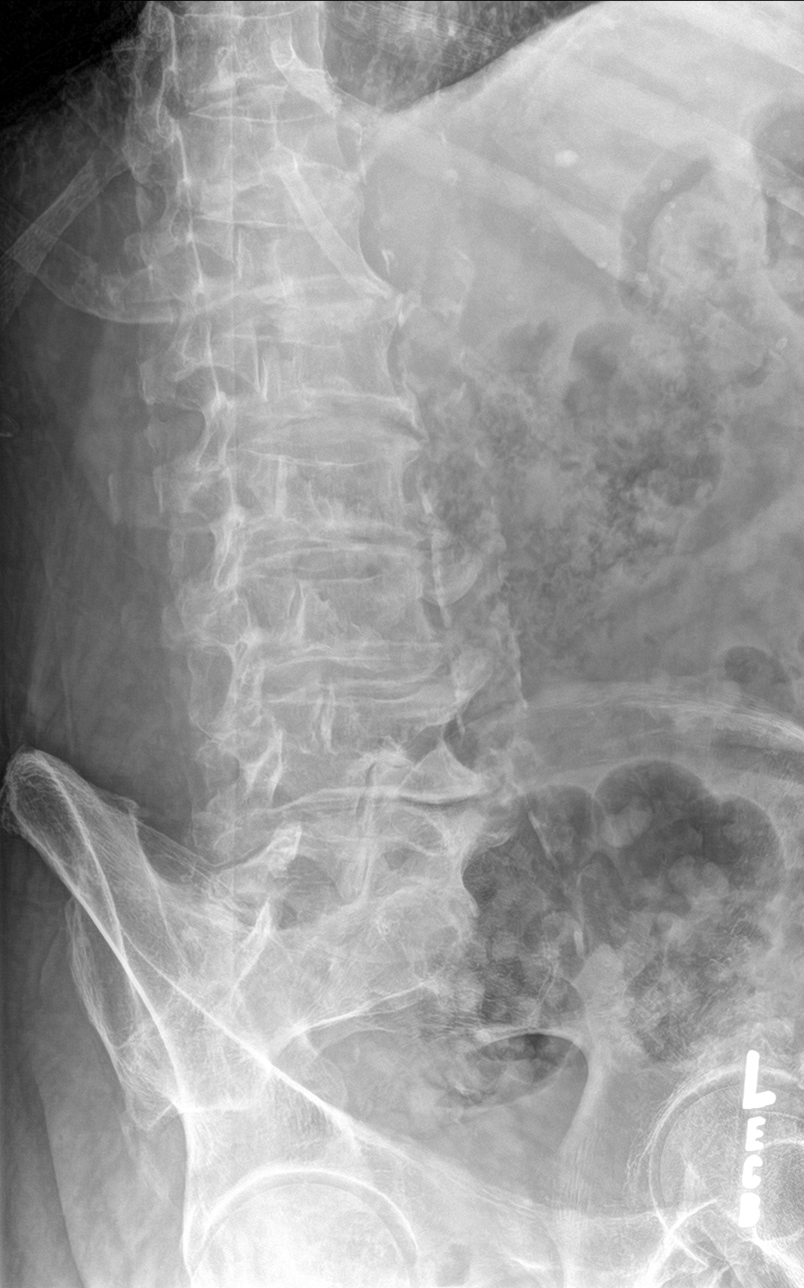

[l-spine lat]
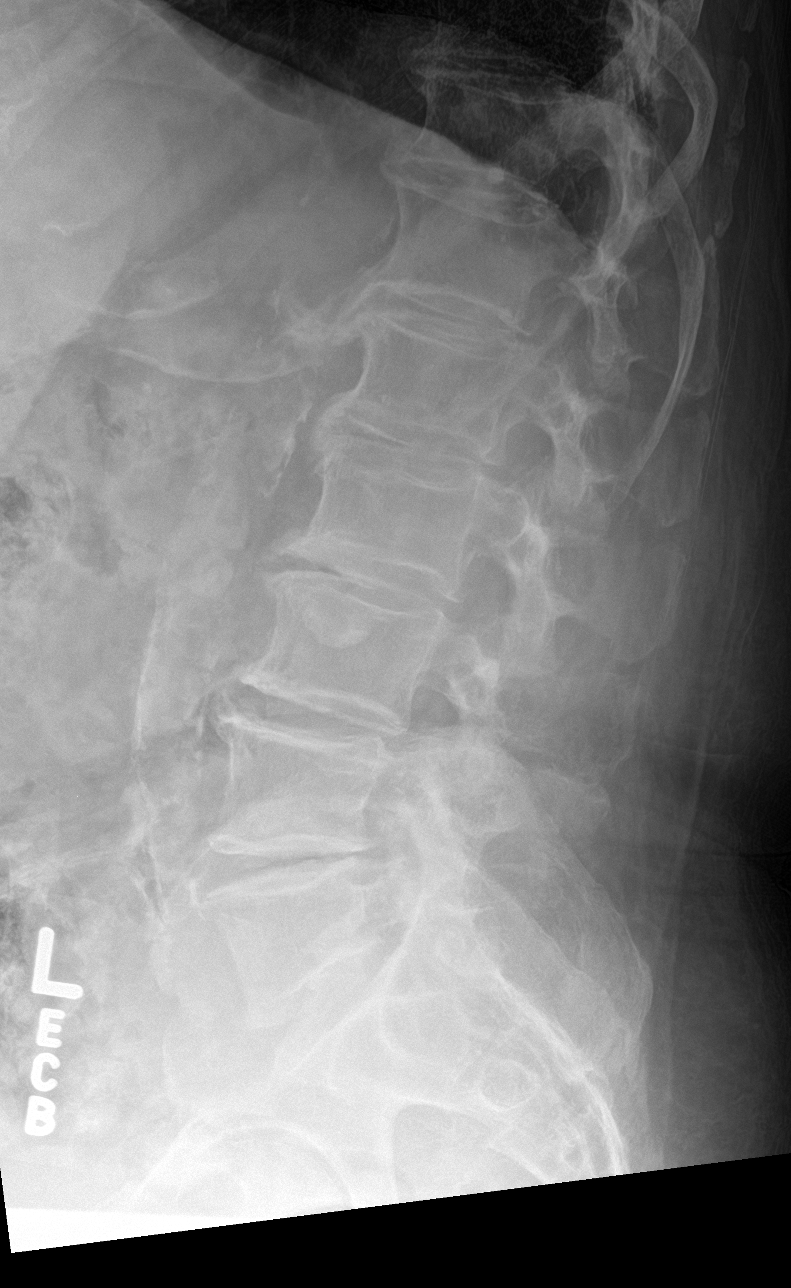

[l-spine spot]
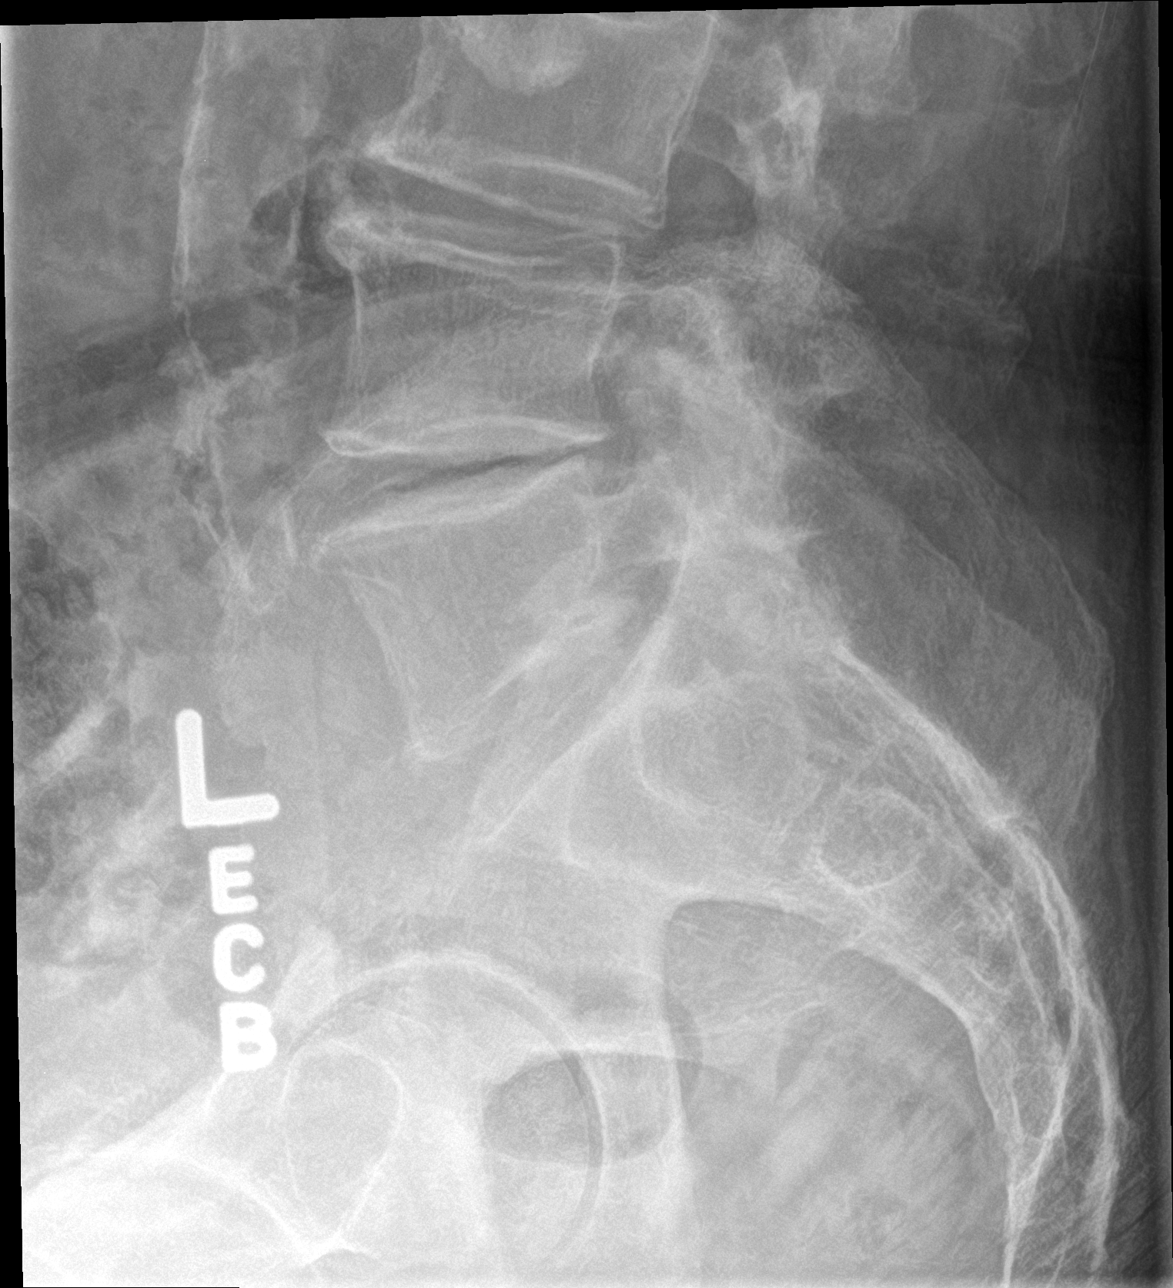

[l-spine ap]
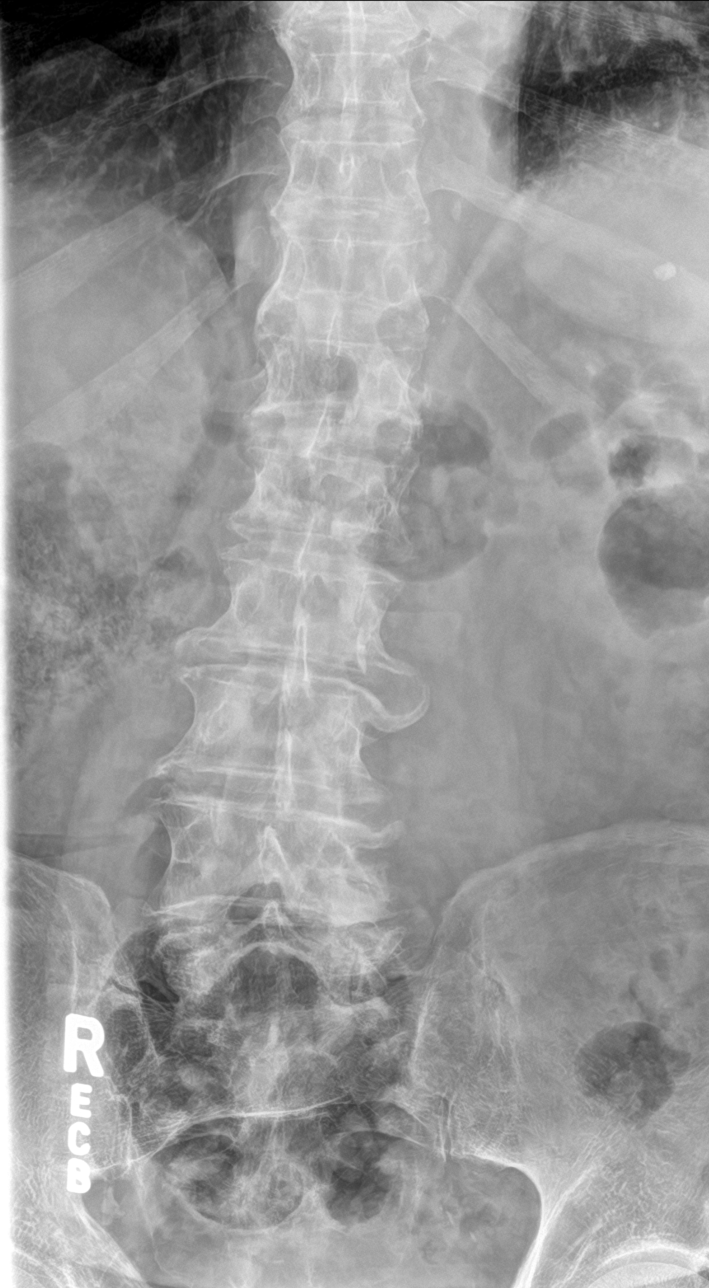

[5 of 5 positions shown; findings below may reference images not displayed]

FINDINGS: Five lumbar type vertebral bodies are well visualized. Multilevel
disc space narrowing and osteophytic changes are seen. No
anterolisthesis is noted. Aortic calcifications are seen. No gross
soft tissue abnormality is noted.
IMPRESSION: Multilevel degenerative change.  No acute abnormality noted.

## 2015-07-04 MED ORDER — BUPIVACAINE HCL 0.5 % IJ SOLN
20.0000 mL | Freq: Once | INTRAMUSCULAR | Status: AC
  Administered 2015-07-04: 20 mL
  Filled 2015-07-04 (×2): qty 20

## 2015-07-04 MED ORDER — NAPROXEN 500 MG PO TABS: 500.0000 mg | ORAL_TABLET | Freq: Two times a day (BID) | ORAL | Status: DC

## 2015-07-04 NOTE — ED Notes (Signed)
Patient here with known lumbar degenerative disc disease and last night fell after tripping over rug and now has severe lumbar back pain.  Denies loc, also has abrasions to bilateral arms.

## 2015-07-04 NOTE — ED Notes (Signed)
The pt thinks its unnecessory  To get any more vitals hes ready to go

## 2015-07-04 NOTE — Discharge Instructions (Signed)
Back Exercises The following exercises strengthen the muscles that help to support the back. They also help to keep the lower back flexible. Doing these exercises can help to prevent back pain or lessen existing pain. If you have back pain or discomfort, try doing these exercises 2-3 times each day or as told by your health care provider. When the pain goes away, do them once each day, but increase the number of times that you repeat the steps for each exercise (do more repetitions). If you do not have back pain or discomfort, do these exercises once each day or as told by your health care provider. EXERCISES Single Knee to Chest Repeat these steps 3-5 times for each leg:  Lie on your back on a firm bed or the floor with your legs extended.  Bring one knee to your chest. Your other leg should stay extended and in contact with the floor.  Hold your knee in place by grabbing your knee or thigh.  Pull on your knee until you feel a gentle stretch in your lower back.  Hold the stretch for 10-30 seconds.  Slowly release and straighten your leg. Pelvic Tilt Repeat these steps 5-10 times:  Lie on your back on a firm bed or the floor with your legs extended.  Bend your knees so they are pointing toward the ceiling and your feet are flat on the floor.  Tighten your lower abdominal muscles to press your lower back against the floor. This motion will tilt your pelvis so your tailbone points up toward the ceiling instead of pointing to your feet or the floor.  With gentle tension and even breathing, hold this position for 5-10 seconds. Cat-Cow Repeat these steps until your lower back becomes more flexible:  Get into a hands-and-knees position on a firm surface. Keep your hands under your shoulders, and keep your knees under your hips. You may place padding under your knees for comfort.  Let your head hang down, and point your tailbone toward the floor so your lower back becomes rounded like the  back of a cat.  Hold this position for 5 seconds.  Slowly lift your head and point your tailbone up toward the ceiling so your back forms a sagging arch like the back of a cow.  Hold this position for 5 seconds. Press-Ups Repeat these steps 5-10 times:  Lie on your abdomen (face-down) on the floor.  Place your palms near your head, about shoulder-width apart.  While you keep your back as relaxed as possible and keep your hips on the floor, slowly straighten your arms to raise the top half of your body and lift your shoulders. Do not use your back muscles to raise your upper torso. You may adjust the placement of your hands to make yourself more comfortable.  Hold this position for 5 seconds while you keep your back relaxed.  Slowly return to lying flat on the floor. Bridges Repeat these steps 10 times:  Lie on your back on a firm surface.  Bend your knees so they are pointing toward the ceiling and your feet are flat on the floor.  Tighten your buttocks muscles and lift your buttocks off of the floor until your waist is at almost the same height as your knees. You should feel the muscles working in your buttocks and the back of your thighs. If you do not feel these muscles, slide your feet 1-2 inches farther away from your buttocks.  Hold this position for 3-5  seconds.  Slowly lower your hips to the starting position, and allow your buttocks muscles to relax completely. If this exercise is too easy, try doing it with your arms crossed over your chest. Abdominal Crunches Repeat these steps 5-10 times: 1. Lie on your back on a firm bed or the floor with your legs extended. 2. Bend your knees so they are pointing toward the ceiling and your feet are flat on the floor. 3. Cross your arms over your chest. 4. Tip your chin slightly toward your chest without bending your neck. 5. Tighten your abdominal muscles and slowly raise your trunk (torso) high enough to lift your shoulder blades  a tiny bit off of the floor. Avoid raising your torso higher than that, because it can put too much stress on your low back and it does not help to strengthen your abdominal muscles. 6. Slowly return to your starting position. Back Lifts Repeat these steps 5-10 times: 1. Lie on your abdomen (face-down) with your arms at your sides, and rest your forehead on the floor. 2. Tighten the muscles in your legs and your buttocks. 3. Slowly lift your chest off of the floor while you keep your hips pressed to the floor. Keep the back of your head in line with the curve in your back. Your eyes should be looking at the floor. 4. Hold this position for 3-5 seconds. 5. Slowly return to your starting position. SEEK MEDICAL CARE IF:  Your back pain or discomfort gets much worse when you do an exercise.  Your back pain or discomfort does not lessen within 2 hours after you exercise. If you have any of these problems, stop doing these exercises right away. Do not do them again unless your health care provider says that you can. SEEK IMMEDIATE MEDICAL CARE IF:  You develop sudden, severe back pain. If this happens, stop doing the exercises right away. Do not do them again unless your health care provider says that you can.   This information is not intended to replace advice given to you by your health care provider. Make sure you discuss any questions you have with your health care provider.   Document Released: 03/02/2004 Document Revised: 10/14/2014 Document Reviewed: 03/19/2014 Elsevier Interactive Patient Education 2016 [employer]  Abrasion An abrasion is a cut or scrape on the outer surface of your skin. An abrasion does not extend through all of the layers of your skin. It is important to care for your abrasion properly to prevent infection. CAUSES Most abrasions are caused by falling on or gliding across the ground or another surface. When your skin rubs on something, the outer and inner layer of  skin rubs off.  SYMPTOMS A cut or scrape is the main symptom of this condition. The scrape may be bleeding, or it may appear red or pink. If there was an associated fall, there may be an underlying bruise. DIAGNOSIS An abrasion is diagnosed with a physical exam. TREATMENT Treatment for this condition depends on how large and deep the abrasion is. Usually, your abrasion will be cleaned with water and mild soap. This removes any dirt or debris that may be stuck. An antibiotic ointment may be applied to the abrasion to help prevent infection. A bandage (dressing) may be placed on the abrasion to keep it clean. You may also need a tetanus shot. HOME CARE INSTRUCTIONS Medicines  Take or apply medicines only as directed by your health care provider.  If you were prescribed an  antibiotic ointment, finish all of it even if you start to feel better. Wound Care  Clean the wound with mild soap and water 2-3 times per day or as directed by your health care provider. Pat your wound dry with a clean towel. Do not rub it.  There are many different ways to close and cover a wound. Follow instructions from your health care provider about:  Wound care.  Dressing changes and removal.  Check your wound every day for signs of infection. Watch for:  Redness, swelling, or pain.  Fluid, blood, or pus. General Instructions  Keep the dressing dry as directed by your health care provider. Do not take baths, swim, use a hot tub, or do anything that would put your wound underwater until your health care provider approves.  If there is swelling, raise (elevate) the injured area above the level of your heart while you are sitting or lying down.  Keep all follow-up visits as directed by your health care provider. This is important. SEEK MEDICAL CARE IF:  You received a tetanus shot and you have swelling, severe pain, redness, or bleeding at the injection site.  Your pain is not controlled with  medicine.  You have increased redness, swelling, or pain at the site of your wound. SEEK IMMEDIATE MEDICAL CARE IF:  You have a red streak going away from your wound.  You have a fever.  You have fluid, blood, or pus coming from your wound.  You notice a bad smell coming from your wound or your dressing.   This information is not intended to replace advice given to you by your health care provider. Make sure you discuss any questions you have with your health care provider.   Document Released: 11/02/2004 Document Revised: 10/14/2014 Document Reviewed: 01/21/2014 Elsevier Interactive Patient Education [employer].

## 2015-07-04 NOTE — ED Notes (Signed)
The pt insists that he needs to ask the doctor questions before he leaves

## 2015-07-04 NOTE — ED Provider Notes (Signed)
CSN: [id number]     Arrival date & time 07/04/15  1448 History   First MD Initiated Contact with Patient 07/04/15 1637     Chief Complaint  Patient presents with  . Fall  . Back Pain     (Consider location/radiation/quality/duration/timing/severity/associated sxs/prior Treatment) Patient is a 82 y.o. male presenting with fall and back pain. The history is provided by the patient.  Fall This is a new problem. The current episode started yesterday. The problem occurs constantly. The problem has not changed since onset.Pertinent negatives include no chest pain, no abdominal pain and no shortness of breath. Nothing aggravates the symptoms. Nothing relieves the symptoms. He has tried nothing for the symptoms.  Back Pain Location:  Lumbar spine Quality:  Aching Radiates to:  Does not radiate Pain severity:  Moderate Onset quality:  Sudden Duration:  1 day Timing:  Constant Progression:  Unchanged Chronicity:  New Context: falling   Relieved by:  Nothing Worsened by:  Movement and twisting Ineffective treatments:  None tried Associated symptoms: no abdominal pain, no bladder incontinence, no bowel incontinence, no chest pain, no leg pain, no numbness and no weakness   Risk factors comment:  Elderly   Past Medical History  Diagnosis Date  . Diverticulosis   . Hypertension   . Hyperlipidemia   . Vitamin D deficiency   . Elevated hemoglobin A1c   . BPH (benign prostatic hypertrophy) with urinary obstruction   . Bradycardia   . Rectal bleed 01/19/13  . Hyperglycemia   . Acute GI bleeding 01/19/13    most likely due to diverticulosis  . Internal hemorrhoids with bleeding and fecal soiling 11/19/2014   Past Surgical History  Procedure Laterality Date  . Rotator cuff repair      bilateral  . Rectal fissure procedure  1970's  . Tonsillectomy    . Anal fissure repair  1980  . Hemorrhoid banding     Family History  Problem Relation Age of Onset  . Colon cancer Neg Hx   .  Heart disease Mother   . Heart disease Father    Social History  Substance Use Topics  . Smoking status: Former Smoker    Quit date: 02/06/1998  . Smokeless tobacco: Former Systems developer  . Alcohol Use: 1.0 - 1.5 oz/week    2-3 drink(s) per week     Comment: couple drinks weekly    Review of Systems  Respiratory: Negative for shortness of breath.   Cardiovascular: Negative for chest pain.  Gastrointestinal: Negative for abdominal pain and bowel incontinence.  Genitourinary: Negative for bladder incontinence.  Musculoskeletal: Positive for back pain.  Neurological: Negative for weakness and numbness.  All other systems reviewed and are negative.     Allergies  Trazamine  Home Medications   Prior to Admission medications   Medication Sig Start Date End Date Taking? Authorizing Provider  aspirin 81 MG tablet Take 81 mg by mouth every morning.     Historical Provider, MD  B Complex-C (B-COMPLEX WITH VITAMIN C) tablet Take 1 tablet by mouth every morning.     Historical Provider, MD  calcium carbonate (OS-CAL) 600 MG TABS tablet Take 600 mg by mouth daily with breakfast.    Historical Provider, MD  Cholecalciferol (VITAMIN D3) 5000 UNITS CAPS Take 1 capsule by mouth every morning.    Historical Provider, MD  CRANBERRY EXTRACT PO Take 1 capsule by mouth every morning.     Historical Provider, MD  Cyanocobalamin (VITAMIN B-12 CR PO) Take 1 tablet  by mouth daily.    Historical Provider, MD  cyclobenzaprine (FLEXERIL) 10 MG tablet Take 10 mg by mouth 3 (three) times daily as needed. 12/26/13   Historical Provider, MD  diazepam (VALIUM) 10 MG tablet Take 10 mg by mouth 3 (three) times daily as needed.     Historical Provider, MD  Garlic 10 MG CAPS Take 1 capsule by mouth every morning.     Historical Provider, MD  hydrocortisone (ANUSOL-HC) 25 MG suppository Place 1 suppository (25 mg total) rectally 2 (two) times daily. Patient taking differently: Place 25 mg rectally as needed.  10/05/14    Lori P Hvozdovic, PA-C  lidocaine (LIDODERM) 5 % as needed.  12/15/13   Historical Provider, MD  magnesium gluconate (MAGONATE) 500 MG tablet Take 500 mg by mouth every morning.    Historical Provider, MD  meloxicam (MOBIC) 15 MG tablet Take 15 mg by mouth as needed for pain.    Historical Provider, MD  Menthol-Zinc Oxide (CALMOSEPTINE) 0.44-20.6 % OINT Apply to external perirectal area 2-3 x daily x 2 weeks 10/05/14   Lori P Hvozdovic, PA-C  methocarbamol (ROBAXIN) 750 MG tablet TAKE 1 TABLET BY MOUTH 3 TIMES A DAY Patient taking differently: TAKE 1 TABLET BY MOUTH 3 TIMES A DAY prn 12/14/13   Unk Pinto, MD  pravastatin (PRAVACHOL) 40 MG tablet Take 40 mg by mouth every morning.     Historical Provider, MD  Pyridoxine HCl (VITAMIN B-6 PO) Take 1 tablet by mouth daily.    Historical Provider, MD  Saw Palmetto, Serenoa repens, (SAW PALMETTO PO) Take 1 capsule by mouth every morning.     Historical Provider, MD  Selenium (SELENIMIN PO) Take 1 capsule by mouth every morning.     Historical Provider, MD  Zinc 50 MG CAPS Take 1 capsule by mouth every morning.     Historical Provider, MD   BP 147/71 mmHg  Pulse 60  Temp(Src) 97.9 F (36.6 C) (Oral)  Resp 20  SpO2 95% Physical Exam  Constitutional: He is oriented to person, place, and time. He appears well-developed and well-nourished. No distress.  HENT:  Head: Normocephalic and atraumatic.  Eyes: Conjunctivae are normal.  Neck: Neck supple. No tracheal deviation present.  Cardiovascular: Normal rate and regular rhythm.   Pulmonary/Chest: Effort normal. No respiratory distress.  Abdominal: Soft. He exhibits no distension.  Musculoskeletal:       Lumbar back: He exhibits bony tenderness (bilateral paraspinal muscles) and pain. He exhibits no tenderness.  Abrasion of right elbow and left ulnar forearm, hemostatic  Neurological: He is alert and oriented to person, place, and time.  Skin: Skin is warm and dry.  Psychiatric: He has a normal  mood and affect.  Vitals reviewed.   ED Course  Procedures (including critical care time)  Procedure Note: Trigger Point Injection for Myofascial pain  Performed by Dr. Laneta Simmers Indication: muscle/myofascial pain Muscle body and tendon sheath of the left lumbar paraspinal and right lumbar and thoracic paraspinal muscle(s) were injected with 0.5% bupivacaine under sterile technique for release of muscle spasm/pain. Patient tolerated well with immediate improvement of symptoms and no immediate complications following procedure.  CPT Code:  3 or more muscle bodies: 20553   Labs Review Labs Reviewed - No data to display  Imaging Review Dg Lumbar Spine Complete  07/04/2015  CLINICAL DATA:  Low back pain EXAM: LUMBAR SPINE - COMPLETE 4+ VIEW COMPARISON:  None. FINDINGS: Five lumbar type vertebral bodies are well visualized. Multilevel disc space narrowing and  osteophytic changes are seen. No anterolisthesis is noted. Aortic calcifications are seen. No gross soft tissue abnormality is noted. IMPRESSION: Multilevel degenerative change.  No acute abnormality noted. Electronically Signed   By: Inez Catalina M.D.   On: 07/04/2015 17:46   I have personally reviewed and evaluated these images and lab results as part of my medical decision-making.   EKG Interpretation None      MDM   Final diagnoses:  Bilateral low back pain without sciatica  Fall from standing, initial encounter  Arm abrasion, unspecified laterality, initial encounter    82 year old male presents with bilateral low back pain after a fall from standing last night where he tripped over a rug and fell forward onto his elbows. No red flag symptoms of fever, weight loss, saddle anesthesia, weakness, fecal/urinary incontinence or urinary retention. His pain appears mostly lateral to midline. Given his advanced age and high risk demographic screening x-rays were ordered to evaluate for compression fracture or other abnormality. No  evidence of spinal cord involvement currently. He does have some chronic disc disease but does not appear to have any radicular symptoms currently. Patient was offered local trigger point injections in his low back to help with symptoms and short-term. Patient was recommended to take short course of scheduled NSAIDs and engage in early mobility as definitive treatment. Plan to follow up with PCP as needed and return precautions discussed for worsening or new concerning symptoms.   Leo Grosser, MD 07/04/15 [id number]

## 2015-07-04 NOTE — ED Notes (Signed)
The pt fell yesterday and since then he has had lower back pain.  Abrasion to his rt elbow  To xray

## 2015-07-04 NOTE — ED Notes (Signed)
Discharge delayed due to the pt has more questions for the doctor

## 2015-07-15 DIAGNOSIS — M47816 Spondylosis without myelopathy or radiculopathy, lumbar region: Secondary | ICD-10-CM | POA: Diagnosis not present

## 2015-07-15 DIAGNOSIS — M5136 Other intervertebral disc degeneration, lumbar region: Secondary | ICD-10-CM | POA: Diagnosis not present

## 2015-07-15 DIAGNOSIS — M4806 Spinal stenosis, lumbar region: Secondary | ICD-10-CM | POA: Diagnosis not present

## 2015-07-27 DIAGNOSIS — M47816 Spondylosis without myelopathy or radiculopathy, lumbar region: Secondary | ICD-10-CM | POA: Diagnosis not present

## 2015-08-03 DIAGNOSIS — M4806 Spinal stenosis, lumbar region: Secondary | ICD-10-CM | POA: Diagnosis not present

## 2015-08-03 DIAGNOSIS — M5416 Radiculopathy, lumbar region: Secondary | ICD-10-CM | POA: Diagnosis not present

## 2015-08-03 DIAGNOSIS — M5136 Other intervertebral disc degeneration, lumbar region: Secondary | ICD-10-CM | POA: Diagnosis not present

## 2015-08-16 DIAGNOSIS — L570 Actinic keratosis: Secondary | ICD-10-CM | POA: Diagnosis not present

## 2015-09-03 DIAGNOSIS — M545 Low back pain: Secondary | ICD-10-CM | POA: Diagnosis not present

## 2015-10-01 DIAGNOSIS — H9209 Otalgia, unspecified ear: Secondary | ICD-10-CM | POA: Diagnosis not present

## 2015-10-26 DIAGNOSIS — M47816 Spondylosis without myelopathy or radiculopathy, lumbar region: Secondary | ICD-10-CM | POA: Diagnosis not present

## 2015-11-09 DIAGNOSIS — M47816 Spondylosis without myelopathy or radiculopathy, lumbar region: Secondary | ICD-10-CM | POA: Diagnosis not present

## 2015-11-12 DIAGNOSIS — Z23 Encounter for immunization: Secondary | ICD-10-CM | POA: Diagnosis not present

## 2015-12-02 DIAGNOSIS — E7800 Pure hypercholesterolemia, unspecified: Secondary | ICD-10-CM | POA: Diagnosis not present

## 2015-12-02 DIAGNOSIS — Z0000 Encounter for general adult medical examination without abnormal findings: Secondary | ICD-10-CM | POA: Diagnosis not present

## 2015-12-02 DIAGNOSIS — M545 Low back pain: Secondary | ICD-10-CM | POA: Diagnosis not present

## 2015-12-24 DIAGNOSIS — M5136 Other intervertebral disc degeneration, lumbar region: Secondary | ICD-10-CM | POA: Diagnosis not present

## 2015-12-24 DIAGNOSIS — M47816 Spondylosis without myelopathy or radiculopathy, lumbar region: Secondary | ICD-10-CM | POA: Diagnosis not present

## 2016-01-10 DIAGNOSIS — M47816 Spondylosis without myelopathy or radiculopathy, lumbar region: Secondary | ICD-10-CM | POA: Diagnosis not present

## 2016-01-10 DIAGNOSIS — M48061 Spinal stenosis, lumbar region without neurogenic claudication: Secondary | ICD-10-CM | POA: Diagnosis not present

## 2016-01-10 DIAGNOSIS — M5416 Radiculopathy, lumbar region: Secondary | ICD-10-CM | POA: Diagnosis not present

## 2016-01-10 DIAGNOSIS — M5136 Other intervertebral disc degeneration, lumbar region: Secondary | ICD-10-CM | POA: Diagnosis not present

## 2016-01-24 DIAGNOSIS — M545 Low back pain: Secondary | ICD-10-CM | POA: Diagnosis not present

## 2016-02-14 DIAGNOSIS — L219 Seborrheic dermatitis, unspecified: Secondary | ICD-10-CM | POA: Diagnosis not present

## 2016-02-14 DIAGNOSIS — L821 Other seborrheic keratosis: Secondary | ICD-10-CM | POA: Diagnosis not present

## 2016-02-14 DIAGNOSIS — L570 Actinic keratosis: Secondary | ICD-10-CM | POA: Diagnosis not present

## 2016-03-08 DIAGNOSIS — M5416 Radiculopathy, lumbar region: Secondary | ICD-10-CM | POA: Diagnosis not present

## 2016-03-08 DIAGNOSIS — M47816 Spondylosis without myelopathy or radiculopathy, lumbar region: Secondary | ICD-10-CM | POA: Diagnosis not present

## 2016-03-08 DIAGNOSIS — M5136 Other intervertebral disc degeneration, lumbar region: Secondary | ICD-10-CM | POA: Diagnosis not present

## 2016-04-06 DIAGNOSIS — M545 Low back pain: Secondary | ICD-10-CM | POA: Diagnosis not present

## 2016-04-24 DIAGNOSIS — M5416 Radiculopathy, lumbar region: Secondary | ICD-10-CM | POA: Diagnosis not present

## 2016-04-24 DIAGNOSIS — R0781 Pleurodynia: Secondary | ICD-10-CM | POA: Diagnosis not present

## 2016-04-24 DIAGNOSIS — M5136 Other intervertebral disc degeneration, lumbar region: Secondary | ICD-10-CM | POA: Diagnosis not present

## 2016-05-30 DIAGNOSIS — M5136 Other intervertebral disc degeneration, lumbar region: Secondary | ICD-10-CM | POA: Diagnosis not present

## 2016-05-30 DIAGNOSIS — M5416 Radiculopathy, lumbar region: Secondary | ICD-10-CM | POA: Diagnosis not present

## 2016-05-30 DIAGNOSIS — R0781 Pleurodynia: Secondary | ICD-10-CM | POA: Diagnosis not present

## 2016-07-18 DIAGNOSIS — M47816 Spondylosis without myelopathy or radiculopathy, lumbar region: Secondary | ICD-10-CM | POA: Diagnosis not present

## 2016-08-11 DIAGNOSIS — S20212A Contusion of left front wall of thorax, initial encounter: Secondary | ICD-10-CM | POA: Diagnosis not present

## 2016-08-11 DIAGNOSIS — M7061 Trochanteric bursitis, right hip: Secondary | ICD-10-CM | POA: Diagnosis not present

## 2016-08-14 DIAGNOSIS — M48061 Spinal stenosis, lumbar region without neurogenic claudication: Secondary | ICD-10-CM | POA: Diagnosis not present

## 2016-08-14 DIAGNOSIS — M5416 Radiculopathy, lumbar region: Secondary | ICD-10-CM | POA: Diagnosis not present

## 2016-08-14 DIAGNOSIS — M47816 Spondylosis without myelopathy or radiculopathy, lumbar region: Secondary | ICD-10-CM | POA: Diagnosis not present

## 2016-08-28 DIAGNOSIS — L570 Actinic keratosis: Secondary | ICD-10-CM | POA: Diagnosis not present

## 2016-08-28 DIAGNOSIS — L814 Other melanin hyperpigmentation: Secondary | ICD-10-CM | POA: Diagnosis not present

## 2016-08-28 DIAGNOSIS — D225 Melanocytic nevi of trunk: Secondary | ICD-10-CM | POA: Diagnosis not present

## 2016-08-28 DIAGNOSIS — L821 Other seborrheic keratosis: Secondary | ICD-10-CM | POA: Diagnosis not present

## 2016-09-26 DIAGNOSIS — M47816 Spondylosis without myelopathy or radiculopathy, lumbar region: Secondary | ICD-10-CM | POA: Diagnosis not present

## 2016-10-11 DIAGNOSIS — Z23 Encounter for immunization: Secondary | ICD-10-CM | POA: Diagnosis not present

## 2016-10-12 DIAGNOSIS — G894 Chronic pain syndrome: Secondary | ICD-10-CM | POA: Diagnosis not present

## 2016-10-12 DIAGNOSIS — M47816 Spondylosis without myelopathy or radiculopathy, lumbar region: Secondary | ICD-10-CM | POA: Diagnosis not present

## 2016-10-12 DIAGNOSIS — M48061 Spinal stenosis, lumbar region without neurogenic claudication: Secondary | ICD-10-CM | POA: Diagnosis not present

## 2016-10-12 DIAGNOSIS — M545 Low back pain: Secondary | ICD-10-CM | POA: Diagnosis not present

## 2016-11-15 DIAGNOSIS — E7800 Pure hypercholesterolemia, unspecified: Secondary | ICD-10-CM | POA: Diagnosis not present

## 2016-11-15 DIAGNOSIS — Z0000 Encounter for general adult medical examination without abnormal findings: Secondary | ICD-10-CM | POA: Diagnosis not present

## 2016-11-15 DIAGNOSIS — M545 Low back pain: Secondary | ICD-10-CM | POA: Diagnosis not present

## 2016-11-15 DIAGNOSIS — Z23 Encounter for immunization: Secondary | ICD-10-CM | POA: Diagnosis not present

## 2016-11-21 DIAGNOSIS — R0781 Pleurodynia: Secondary | ICD-10-CM | POA: Diagnosis not present

## 2016-11-21 DIAGNOSIS — S2231XA Fracture of one rib, right side, initial encounter for closed fracture: Secondary | ICD-10-CM | POA: Diagnosis not present

## 2016-11-21 DIAGNOSIS — M25561 Pain in right knee: Secondary | ICD-10-CM | POA: Diagnosis not present

## 2016-11-21 DIAGNOSIS — M79651 Pain in right thigh: Secondary | ICD-10-CM | POA: Diagnosis not present

## 2016-11-25 DIAGNOSIS — R0781 Pleurodynia: Secondary | ICD-10-CM | POA: Diagnosis not present

## 2016-11-27 ENCOUNTER — Ambulatory Visit
Admission: RE | Admit: 2016-11-27 | Discharge: 2016-11-27 | Disposition: A | Discharge status: Home / Self Care | Payer: Medicare Other | Source: Ambulatory Visit | Attending: Orthopedic Surgery | Admitting: Orthopedic Surgery

## 2016-11-27 ENCOUNTER — Other Ambulatory Visit: Payer: Self-pay | Admitting: Orthopedic Surgery

## 2016-11-27 DIAGNOSIS — S2241XA Multiple fractures of ribs, right side, initial encounter for closed fracture: Secondary | ICD-10-CM | POA: Diagnosis not present

## 2016-11-27 DIAGNOSIS — R0781 Pleurodynia: Secondary | ICD-10-CM

## 2016-11-27 IMAGING — CT CT CHEST W/O CM
3 of 4 series · 16 of 30 positions shown, 18 images · non-contrast
Comparison: None.

CLINICAL DATA: 84-year-old male with history of bilateral anterior
upper rib pain after fall on [DATE] and again on [DATE].
Former smoker (quit 25 years ago).

EXAM:
CT CHEST WITHOUT CONTRAST
TECHNIQUE: Multidetector CT imaging of the chest was performed following the
standard protocol without IV contrast.

[Series 3: chest w/o · axial · non-contrast · 0.70mm/px · z∈[-276,-36]mm · 7 of 130 slices shown, 9 images]
[im 17/130  mediastinal]
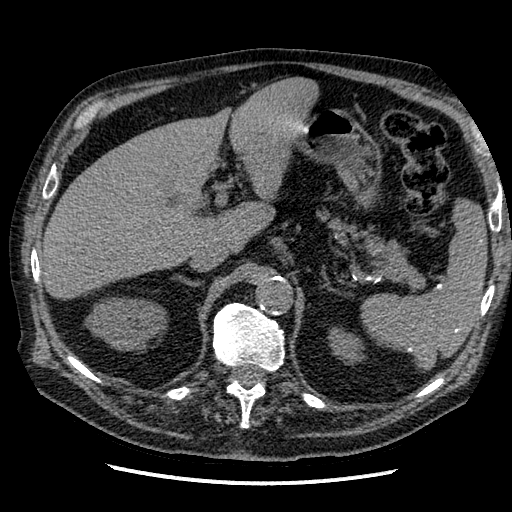
[im 17/130  lung]
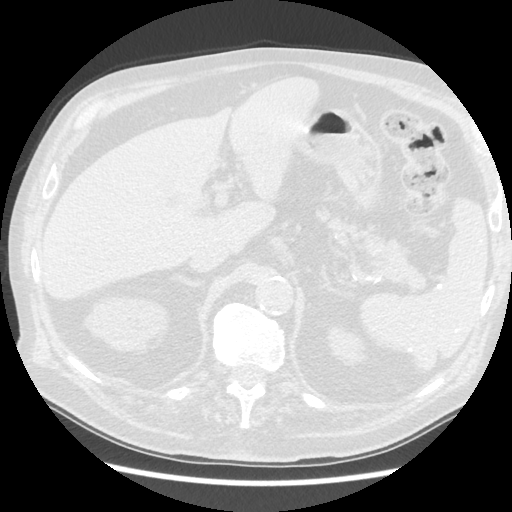
[im 33/130  lung]
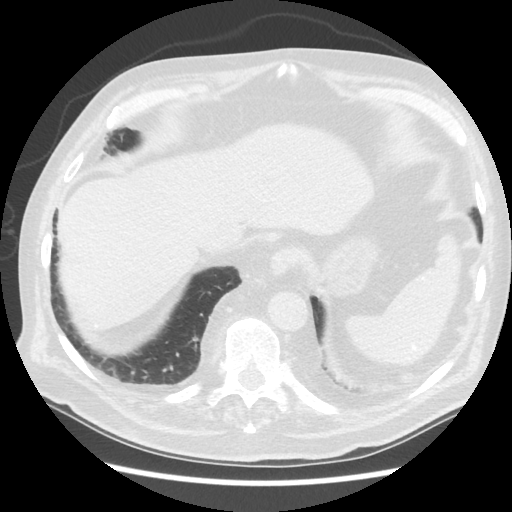
[im 49/130  lung]
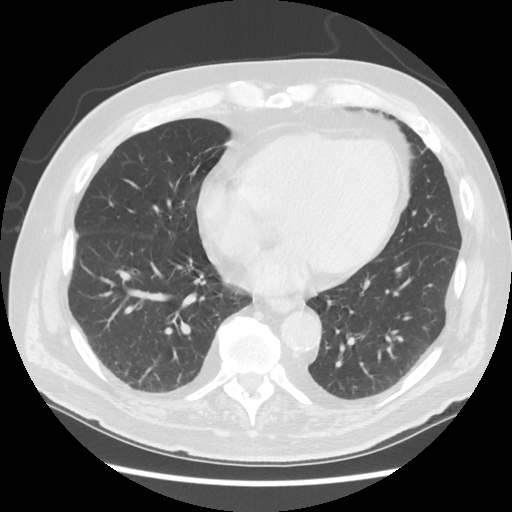
[im 65/130  lung]
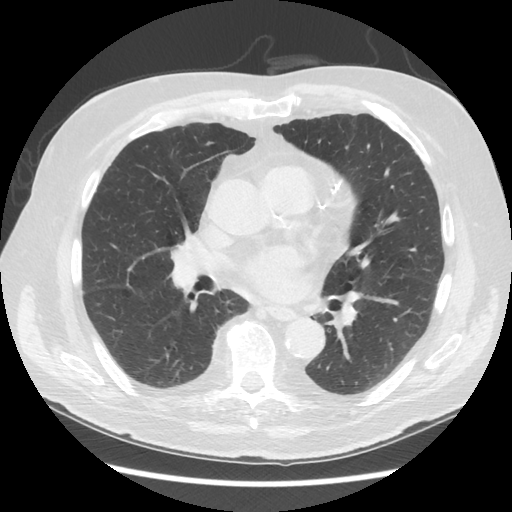
[im 81/130  mediastinal]
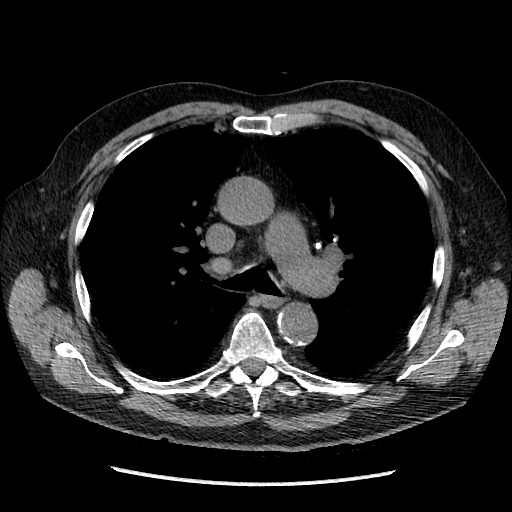
[im 81/130  lung]
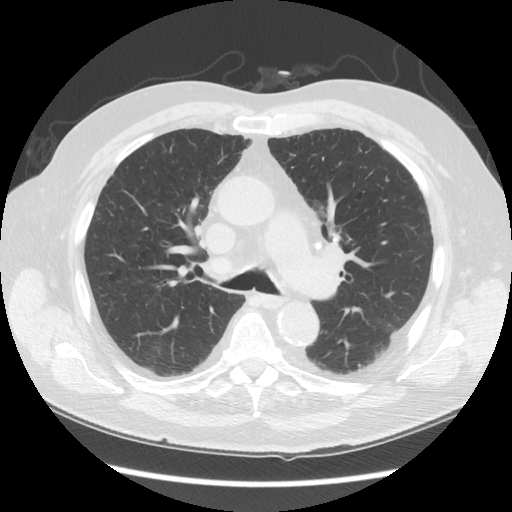
[im 97/130  lung]
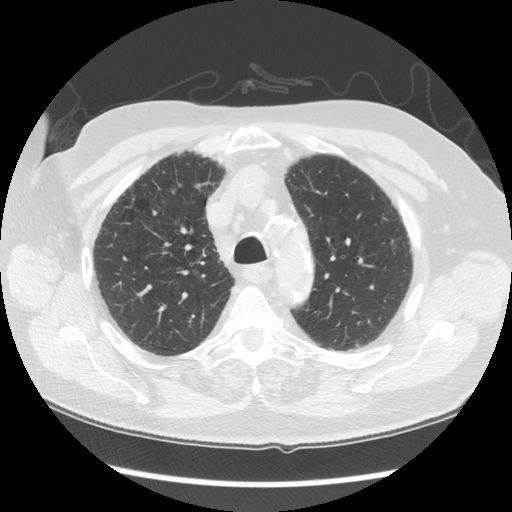
[im 113/130  lung]
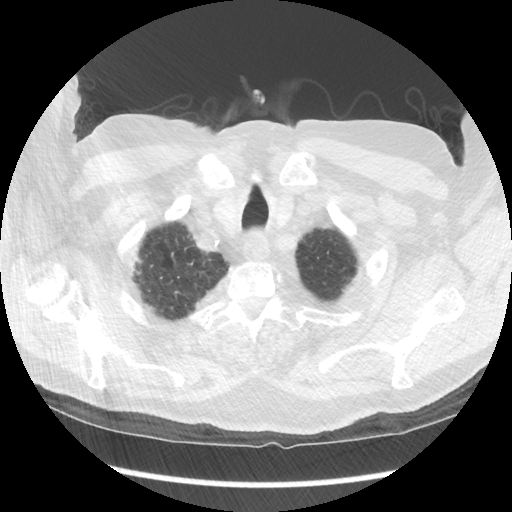

[Series 4: lung windows · axial · 0.70mm/px · z∈[-276,-36]mm · 7 of 130 slices shown]
[im 17/130  lung]
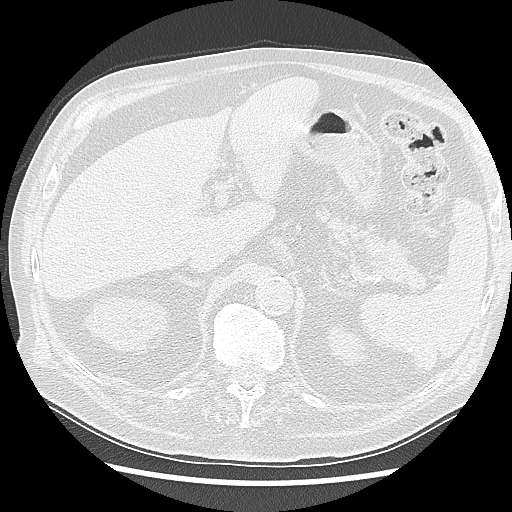
[im 33/130  lung]
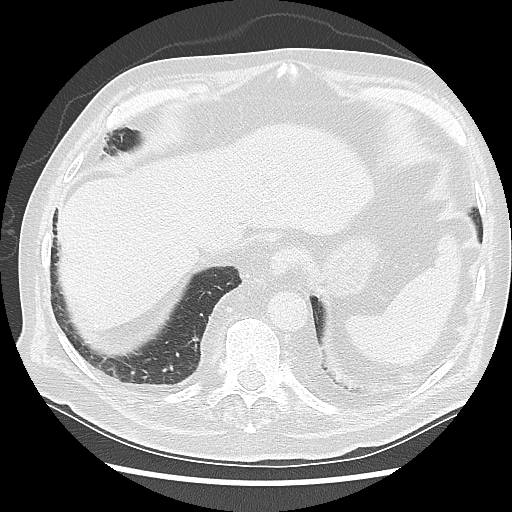
[im 49/130  lung]
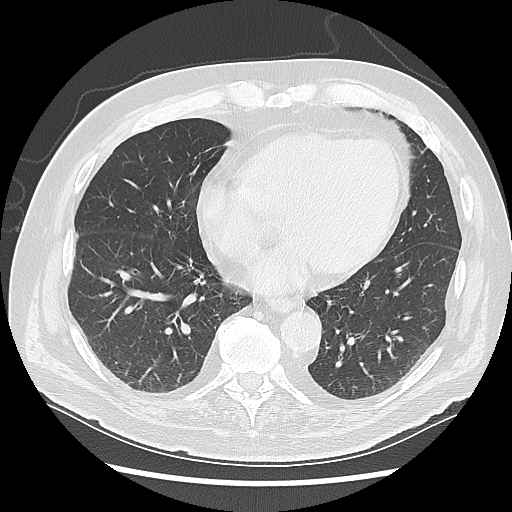
[im 65/130  lung]
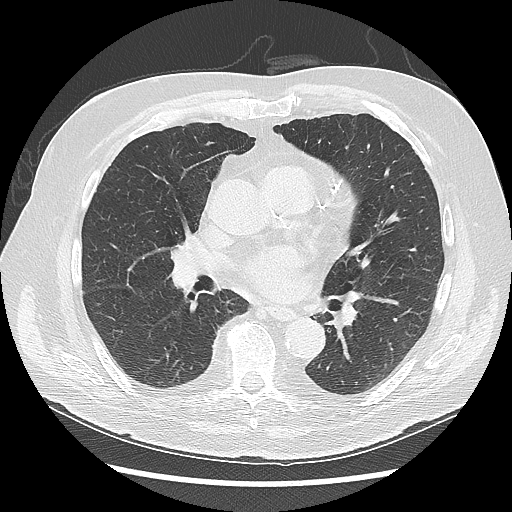
[im 81/130  lung]
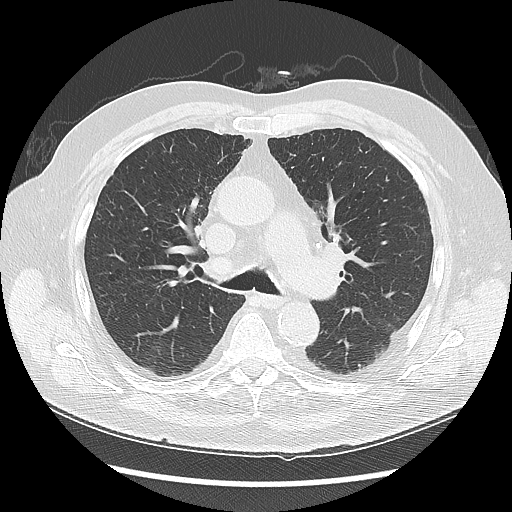
[im 97/130  lung]
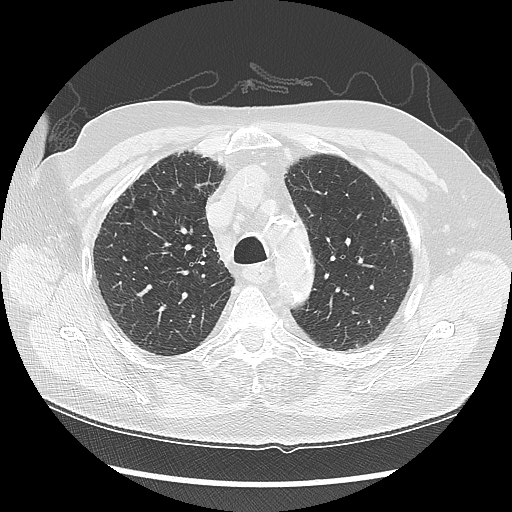
[im 113/130  lung]
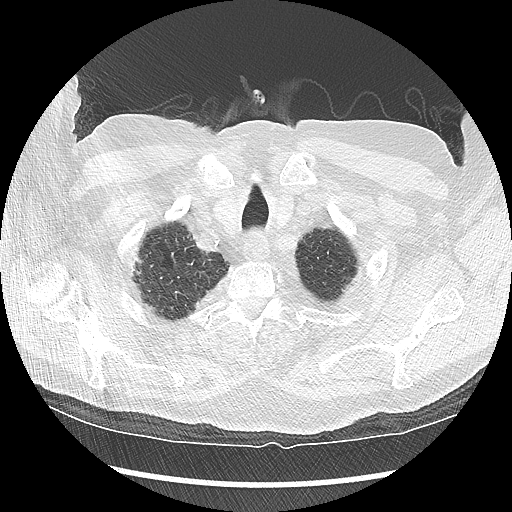

[Series 602: sagittal body · sagittal · 0.70mm/px · 2 of 145 slices shown]
[im 17/145  mediastinal]
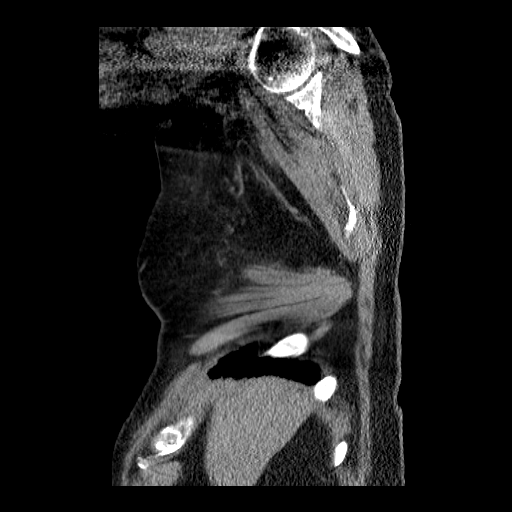
[im 33/145  mediastinal]
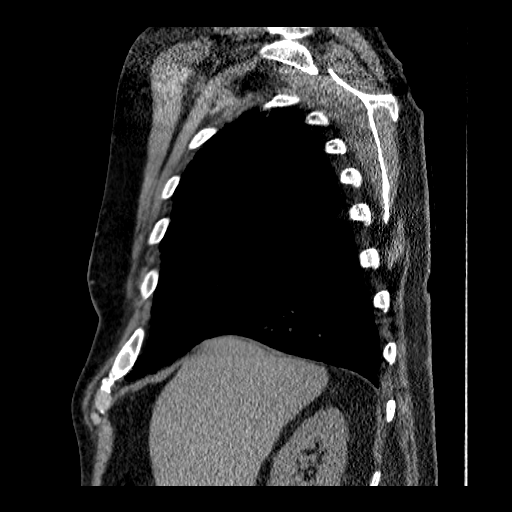

[16 of 30 positions shown; findings below may reference images not displayed]

FINDINGS: Cardiovascular: Heart size is normal. There is no significant
pericardial fluid, thickening or pericardial calcification. There is
aortic atherosclerosis, as well as atherosclerosis of the great
vessels of the mediastinum and the coronary arteries, including
calcified atherosclerotic plaque in the left main, left anterior
descending, left circumflex and right coronary arteries.

Mediastinum/Nodes: Mildly enlarged right peritracheal lymph nodes
measuring up to 11 mm in short axis. No other mediastinal or hilar
lymphadenopathy. Please note that accurate exclusion of hilar
adenopathy is limited on noncontrast CT scans. Esophagus is
unremarkable in appearance. No axillary lymphadenopathy.

Lungs/Pleura: Small calcified granuloma in the apex of the left
upper lobe. No other definite suspicious appearing pulmonary nodules
or masses are noted. No acute consolidative airspace disease. No
pleural effusions. Mild diffuse bronchial wall thickening with mild
centrilobular and paraseptal emphysema. No pneumothorax.

Upper Abdomen: Numerous calcified granulomas throughout the spleen.
Aortic atherosclerosis. Small calcified gallstone in the neck of the
gallbladder. 3.1 cm low-attenuation lesion in the upper pole the
right kidney, incompletely characterized on today's noncontrast CT
examination, but statistically likely a cyst.

Musculoskeletal: Old healed fractures of the anterior left fifth and
sixth ribs. Subtle nondisplaced fracture of the anterior left
seventh rib. Subtle nondisplaced fracture of the anterior aspect of
the right sixth rib.
IMPRESSION: 1. Subtle nondisplaced fractures of the anterior aspect of the left
seventh rib and anterior aspect of the right sixth rib. No
pneumothorax. No other signs of significant acute traumatic injury
to the thorax.
2. Aortic atherosclerosis, in addition to left main and 3 vessel
coronary artery disease. Please note that although the presence of
coronary artery calcium documents the presence of coronary artery
disease, the severity of this disease and any potential stenosis
cannot be assessed on this non-gated CT examination. Assessment for
potential risk factor modification, dietary therapy or pharmacologic
therapy may be warranted, if clinically indicated.
3. Mild diffuse bronchial wall thickening with mild centrilobular
and paraseptal emphysema; imaging findings suggestive of underlying
COPD.
4. Old granulomatous disease, as above.
5. Mildly enlarged right paratracheal lymph nodes measuring up to 11
mm in short axis. This is nonspecific and of doubtful clinical
significance, as no pulmonary lesions are identified.
6. Cholelithiasis.

Aortic Atherosclerosis ([RR]-[RR]) and Emphysema ([RR]-[RR]).

## 2016-12-01 DIAGNOSIS — R0781 Pleurodynia: Secondary | ICD-10-CM | POA: Diagnosis not present

## 2016-12-01 DIAGNOSIS — S2231XD Fracture of one rib, right side, subsequent encounter for fracture with routine healing: Secondary | ICD-10-CM | POA: Diagnosis not present

## 2017-01-04 DIAGNOSIS — M47817 Spondylosis without myelopathy or radiculopathy, lumbosacral region: Secondary | ICD-10-CM | POA: Diagnosis not present

## 2017-01-18 DIAGNOSIS — M47816 Spondylosis without myelopathy or radiculopathy, lumbar region: Secondary | ICD-10-CM | POA: Diagnosis not present

## 2017-02-23 DIAGNOSIS — M47816 Spondylosis without myelopathy or radiculopathy, lumbar region: Secondary | ICD-10-CM | POA: Diagnosis not present

## 2017-02-23 DIAGNOSIS — M5136 Other intervertebral disc degeneration, lumbar region: Secondary | ICD-10-CM | POA: Diagnosis not present

## 2017-03-16 DIAGNOSIS — M545 Low back pain: Secondary | ICD-10-CM | POA: Diagnosis not present

## 2017-04-13 DIAGNOSIS — M5136 Other intervertebral disc degeneration, lumbar region: Secondary | ICD-10-CM | POA: Diagnosis not present

## 2017-04-13 DIAGNOSIS — M545 Low back pain: Secondary | ICD-10-CM | POA: Diagnosis not present

## 2017-05-11 DIAGNOSIS — M5136 Other intervertebral disc degeneration, lumbar region: Secondary | ICD-10-CM | POA: Diagnosis not present

## 2017-05-11 DIAGNOSIS — M545 Low back pain: Secondary | ICD-10-CM | POA: Diagnosis not present

## 2017-05-27 ENCOUNTER — Emergency Department (HOSPITAL_COMMUNITY): Payer: Medicare Other

## 2017-05-27 ENCOUNTER — Other Ambulatory Visit: Payer: Self-pay

## 2017-05-27 ENCOUNTER — Emergency Department (HOSPITAL_COMMUNITY)
Admission: EM | Admit: 2017-05-27 | Discharge: 2017-05-27 | Disposition: A | Discharge status: Home / Self Care | Payer: Medicare Other | Attending: Emergency Medicine | Admitting: Emergency Medicine

## 2017-05-27 DIAGNOSIS — Y9222 Religious institution as the place of occurrence of the external cause: Secondary | ICD-10-CM | POA: Diagnosis not present

## 2017-05-27 DIAGNOSIS — S61402A Unspecified open wound of left hand, initial encounter: Secondary | ICD-10-CM | POA: Insufficient documentation

## 2017-05-27 DIAGNOSIS — I10 Essential (primary) hypertension: Secondary | ICD-10-CM | POA: Insufficient documentation

## 2017-05-27 DIAGNOSIS — Z87891 Personal history of nicotine dependence: Secondary | ICD-10-CM | POA: Diagnosis not present

## 2017-05-27 DIAGNOSIS — S0181XA Laceration without foreign body of other part of head, initial encounter: Secondary | ICD-10-CM | POA: Insufficient documentation

## 2017-05-27 DIAGNOSIS — S51001A Unspecified open wound of right elbow, initial encounter: Secondary | ICD-10-CM | POA: Diagnosis not present

## 2017-05-27 DIAGNOSIS — W108XXA Fall (on) (from) other stairs and steps, initial encounter: Secondary | ICD-10-CM | POA: Insufficient documentation

## 2017-05-27 DIAGNOSIS — S40011A Contusion of right shoulder, initial encounter: Secondary | ICD-10-CM | POA: Insufficient documentation

## 2017-05-27 DIAGNOSIS — S0121XA Laceration without foreign body of nose, initial encounter: Secondary | ICD-10-CM

## 2017-05-27 DIAGNOSIS — Z79899 Other long term (current) drug therapy: Secondary | ICD-10-CM | POA: Insufficient documentation

## 2017-05-27 DIAGNOSIS — Y999 Unspecified external cause status: Secondary | ICD-10-CM | POA: Diagnosis not present

## 2017-05-27 DIAGNOSIS — R55 Syncope and collapse: Secondary | ICD-10-CM | POA: Diagnosis not present

## 2017-05-27 DIAGNOSIS — S0993XA Unspecified injury of face, initial encounter: Secondary | ICD-10-CM | POA: Diagnosis present

## 2017-05-27 DIAGNOSIS — W19XXXA Unspecified fall, initial encounter: Secondary | ICD-10-CM

## 2017-05-27 DIAGNOSIS — Z7982 Long term (current) use of aspirin: Secondary | ICD-10-CM | POA: Insufficient documentation

## 2017-05-27 DIAGNOSIS — M25511 Pain in right shoulder: Secondary | ICD-10-CM | POA: Diagnosis not present

## 2017-05-27 DIAGNOSIS — R079 Chest pain, unspecified: Secondary | ICD-10-CM | POA: Diagnosis not present

## 2017-05-27 DIAGNOSIS — S069X9A Unspecified intracranial injury with loss of consciousness of unspecified duration, initial encounter: Secondary | ICD-10-CM | POA: Diagnosis not present

## 2017-05-27 DIAGNOSIS — Y9389 Activity, other specified: Secondary | ICD-10-CM | POA: Diagnosis not present

## 2017-05-27 DIAGNOSIS — S0990XA Unspecified injury of head, initial encounter: Secondary | ICD-10-CM | POA: Diagnosis not present

## 2017-05-27 IMAGING — DX DG CHEST 2V
2 series · 2 of 2 positions shown · non-contrast
Comparison: Chest CT without contrast dated [DATE].

CLINICAL DATA: Right anterior lower chest pain following a fall
down steps today. Right shoulder pain.

EXAM:
CHEST - 2 VIEW

[x chest ap]
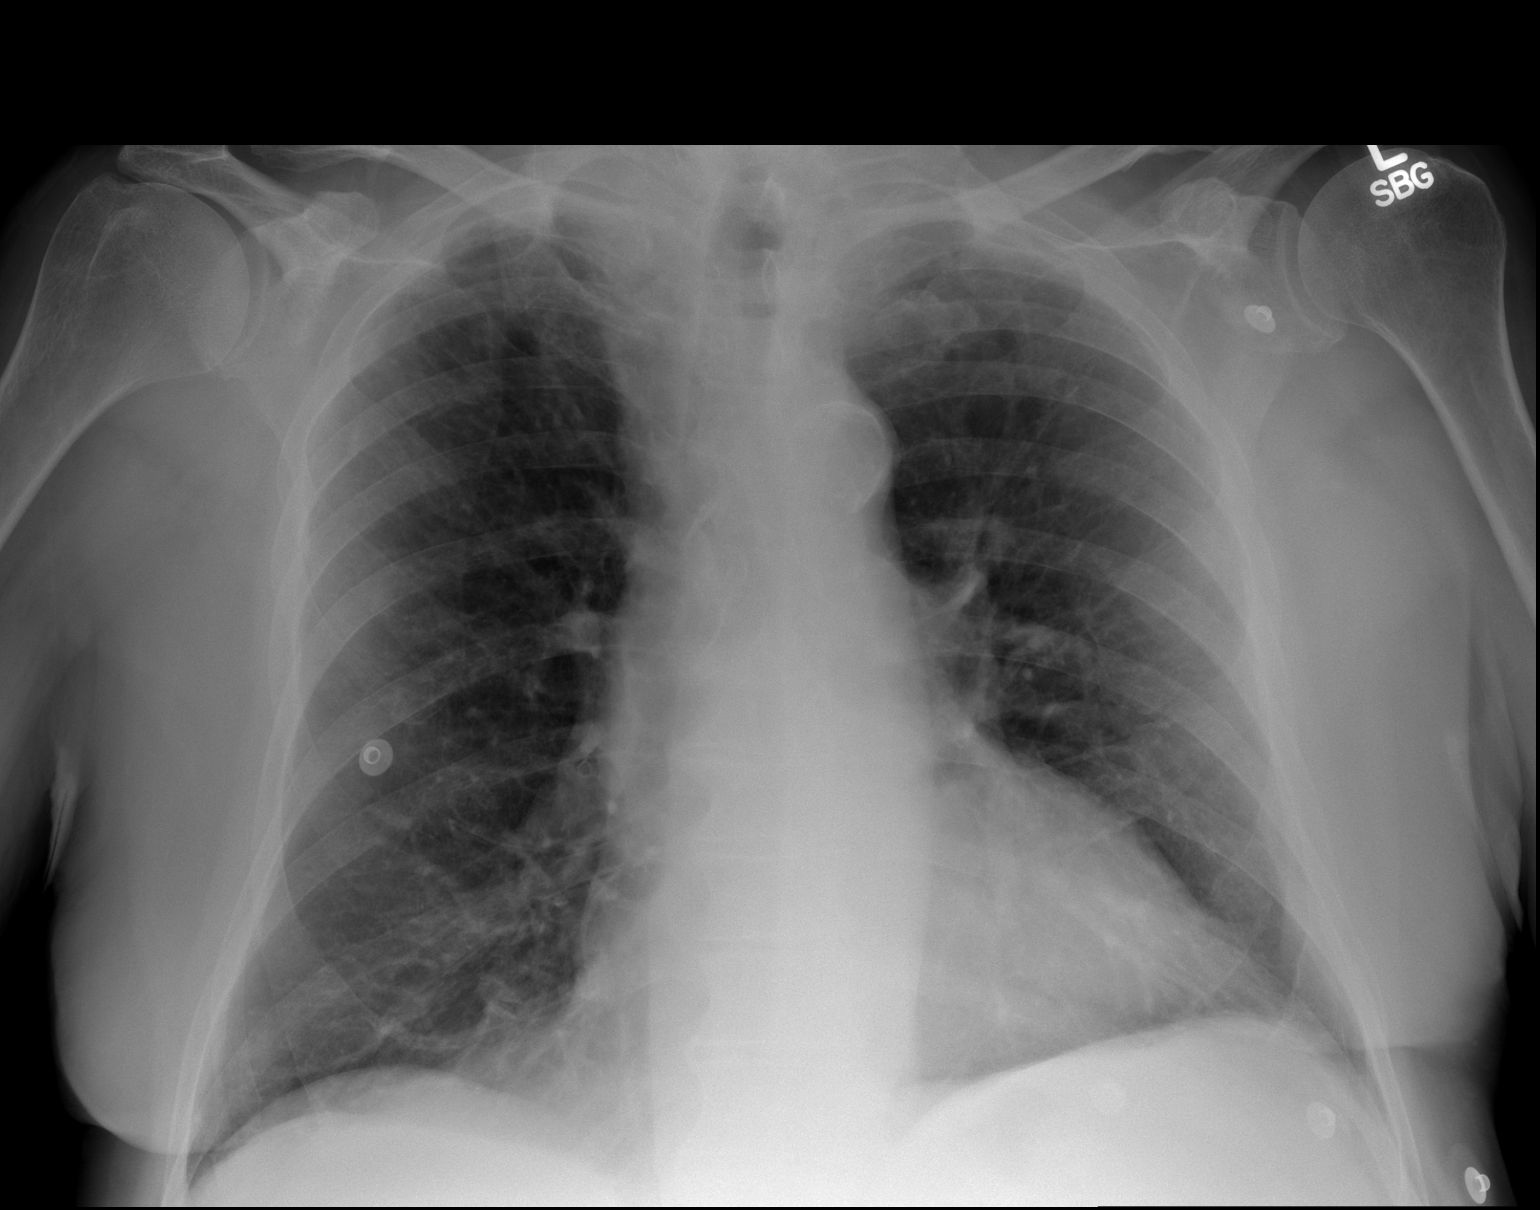

[w chest lat]
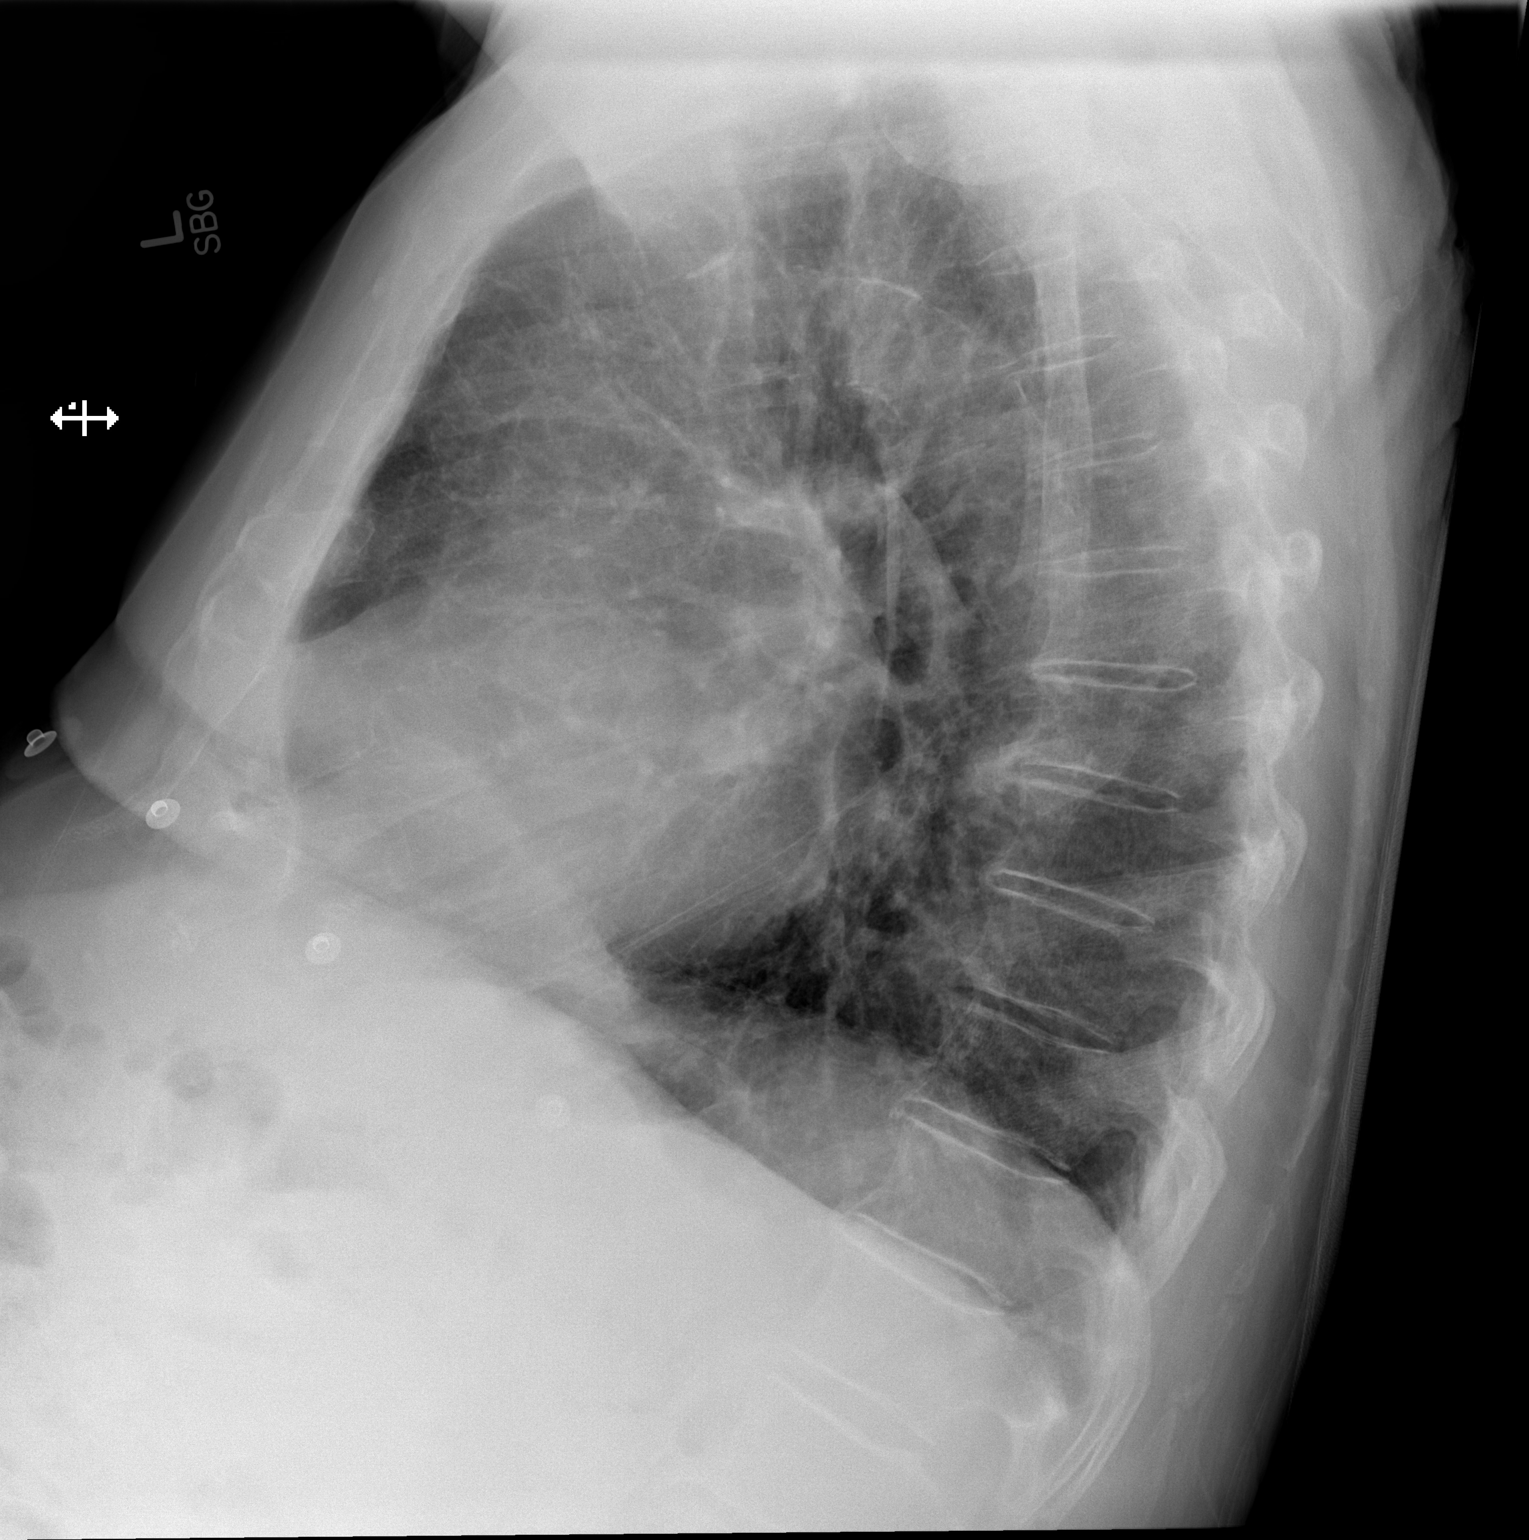

[2 of 2 positions shown; findings below may reference images not displayed]

FINDINGS: Mildly enlarged cardiac silhouette. Mildly hyperexpanded lungs. The
lungs are clear with mild diffuse peribronchial thickening. Thoracic
spine degenerative changes. No fracture or pneumothorax seen.
IMPRESSION: 1. No acute abnormality.
2. Mild cardiomegaly and mild changes of COPD and chronic
bronchitis.

## 2017-05-27 IMAGING — DX DG SHOULDER 2+V*R*
2 series · 2 of 2 positions shown · non-contrast
Comparison: None.

CLINICAL DATA: Fall. Right shoulder pain. History of bilateral rib
fractures. Ex-smoker.

EXAM:
RIGHT SHOULDER - 2+ VIEW

[x scapula y-view right]
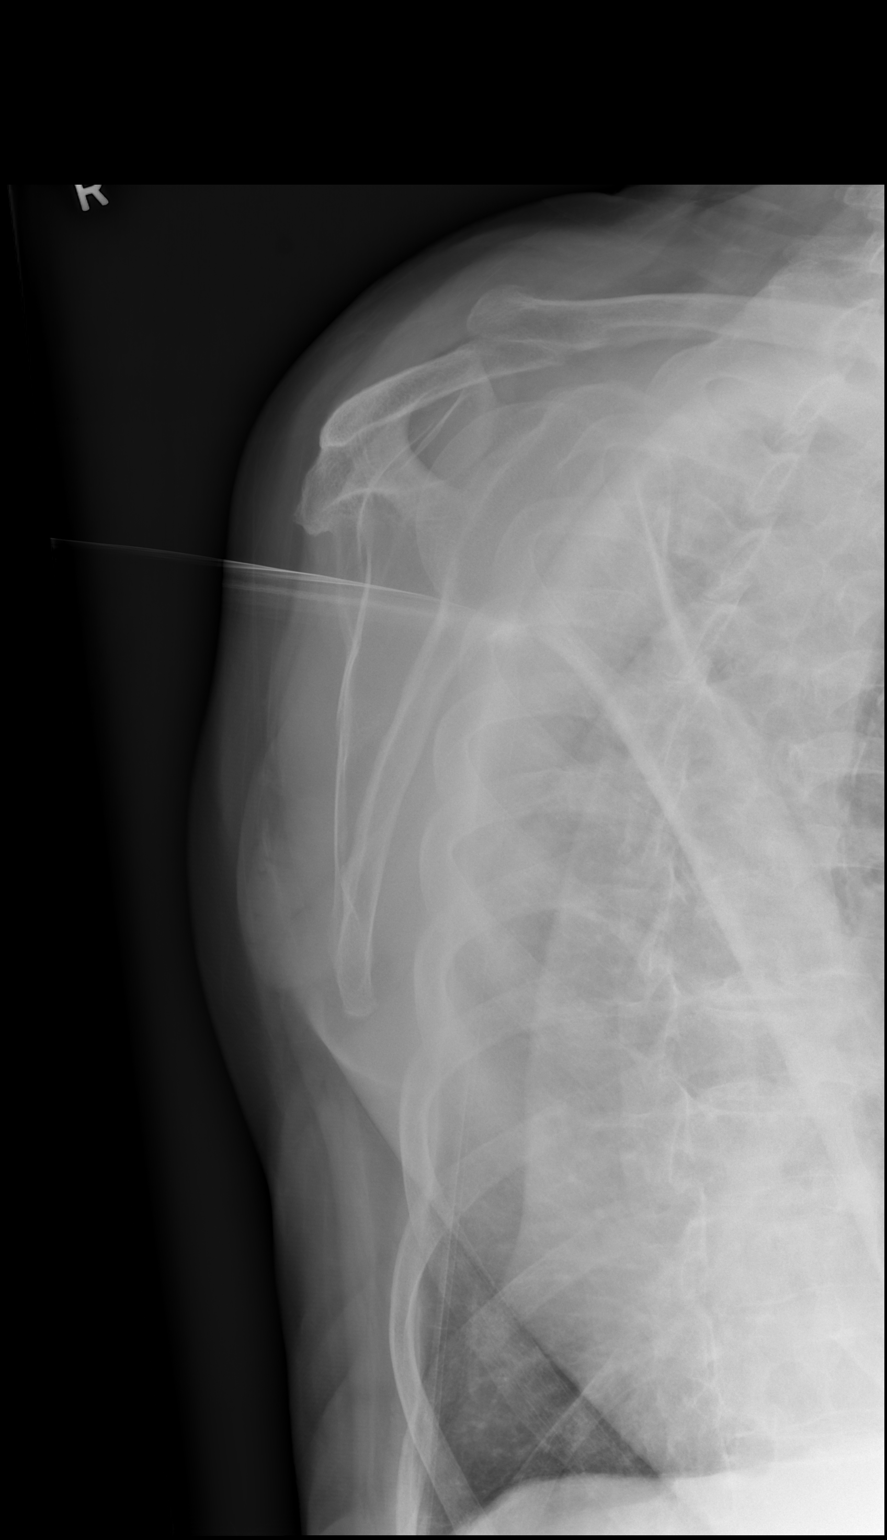

[x shoulder ap right]
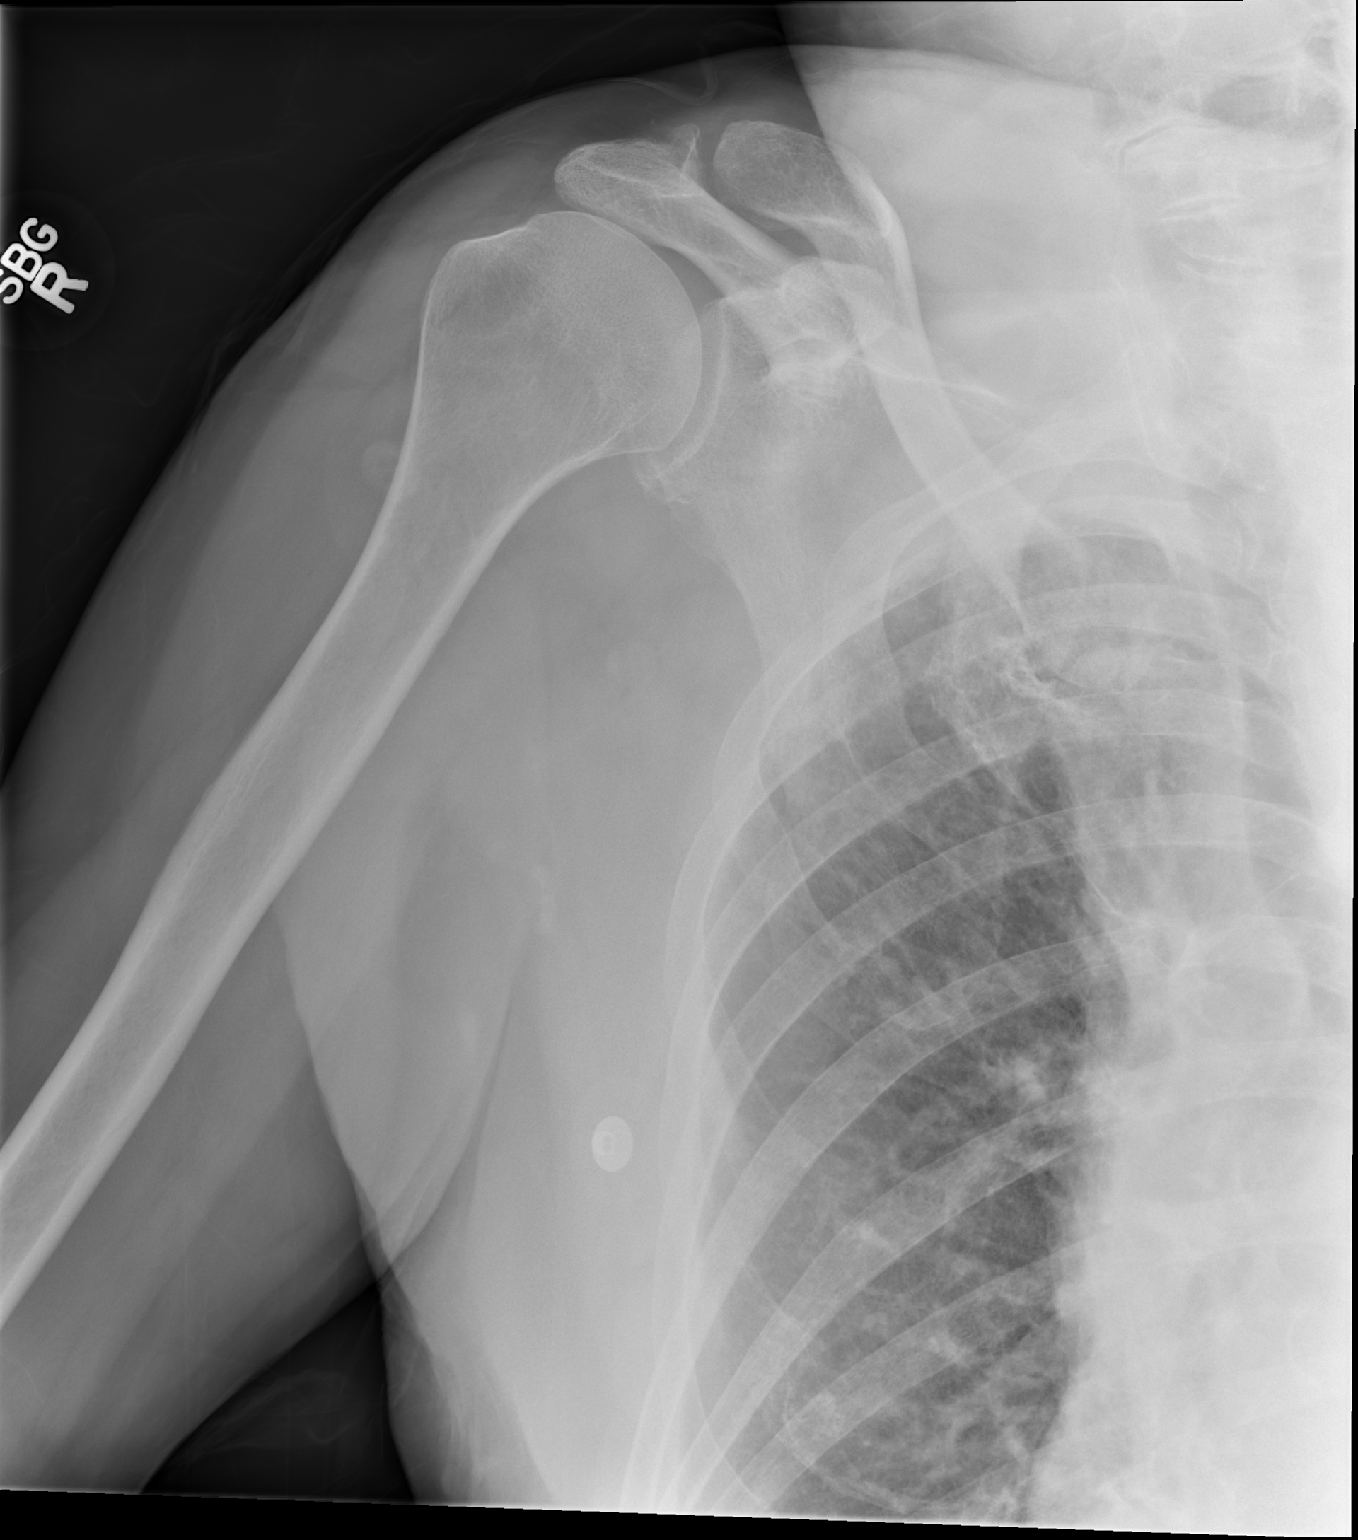

[2 of 2 positions shown; findings below may reference images not displayed]

FINDINGS: AP and scapular Y-views. No acute fracture or dislocation. Suspect
right apical pleuroparenchymal scarring, better evaluated on
dedicated chest radiograph.
IMPRESSION: No acute osseous abnormality.

## 2017-05-27 IMAGING — CT CT MAXILLOFACIAL W/O CM
3 of 6 series · 15 of 47 positions shown, 18 images · non-contrast
Comparison: None.

CLINICAL DATA: Fall. Loss of consciousness. Fell coming down the
stairs o[REDACTED]. Patient hit his forehead on some pain.

EXAM:
CT HEAD WITHOUT CONTRAST
CT MAXILLOFACIAL WITHOUT CONTRAST
TECHNIQUE: Multidetector CT imaging of the head and maxillofacial structures
were performed using the standard protocol without intravenous
contrast. Multiplanar CT image reconstructions of the maxillofacial
structures were also generated.

[Series 5: head 3.0 mpr cor · coronal · 0.32mm/px · 3 of 72 slices shown]
[im 21/72  bone]
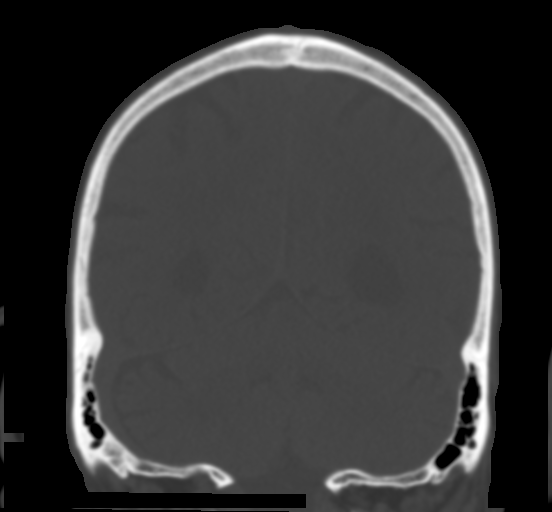
[im 31/72  bone]
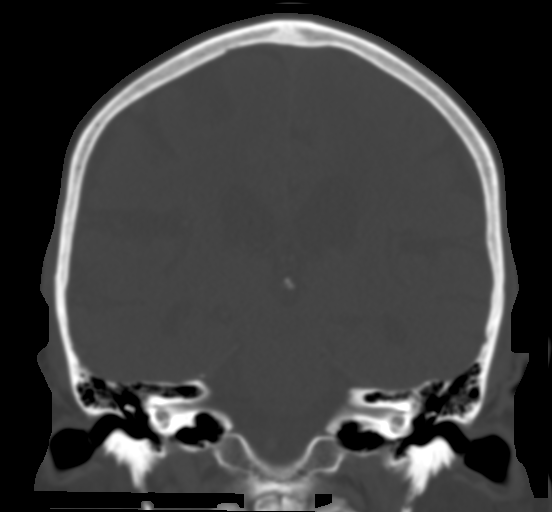
[im 41/72  bone]
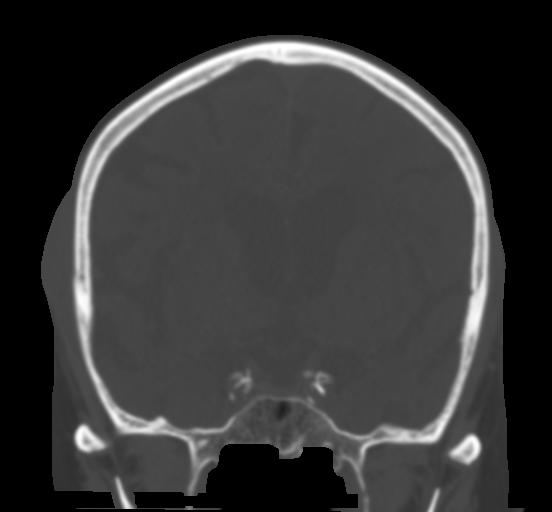

[Series 6: head 3.0 mpr sag · sagittal · 0.32mm/px · 1 of 60 slices shown]
[im 30/60  bone]
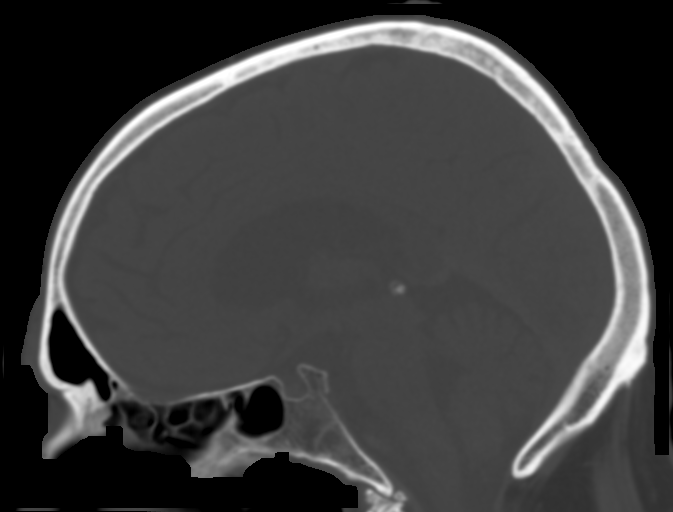

[Series 7: facial/ orbits 2.0 h30s · axial · 0.35mm/px · z∈[-230,-88]mm · 11 of 79 slices shown, 14 images]
[im 4/79  brain]
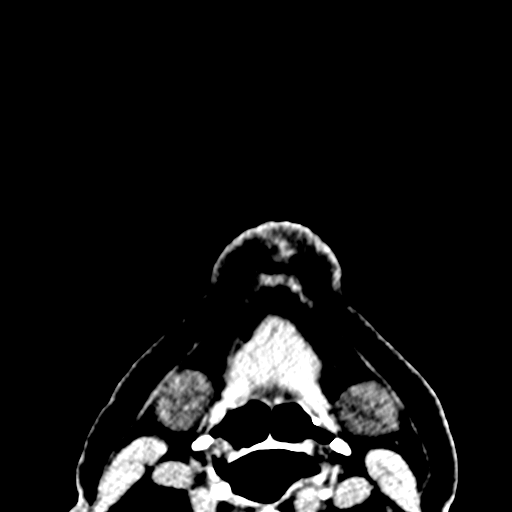
[im 4/79  bone]
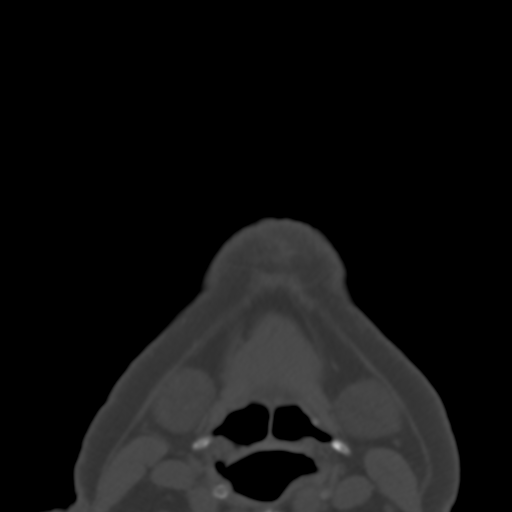
[im 12/79  bone]
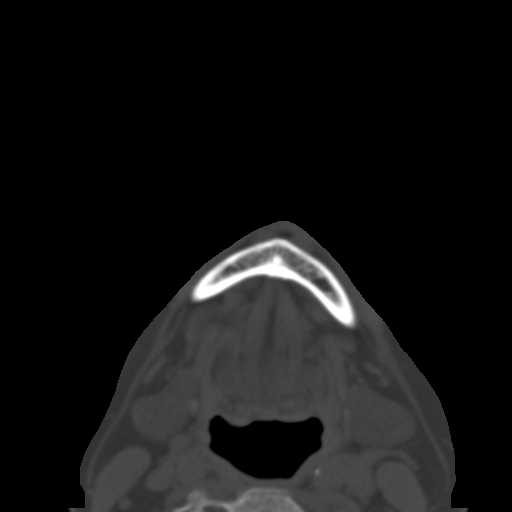
[im 20/79  bone]
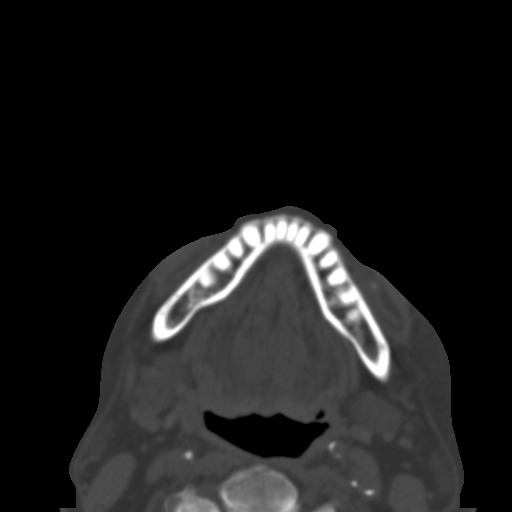
[im 28/79  bone]
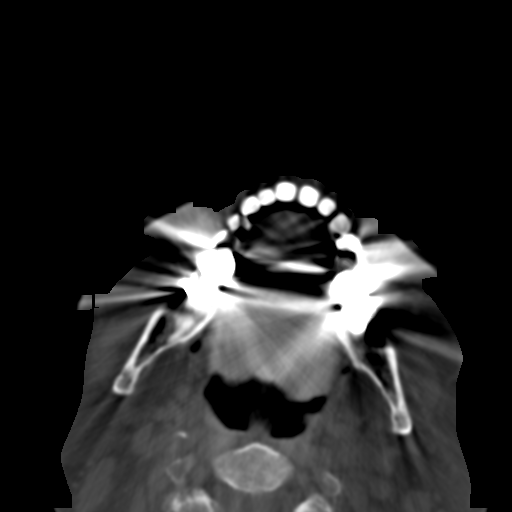
[im 32/79  brain]
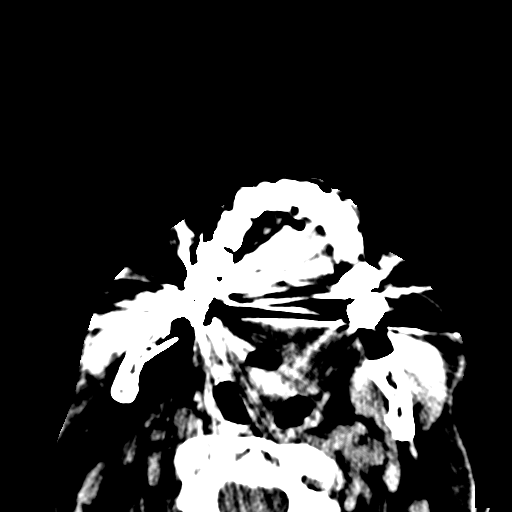
[im 32/79  bone]
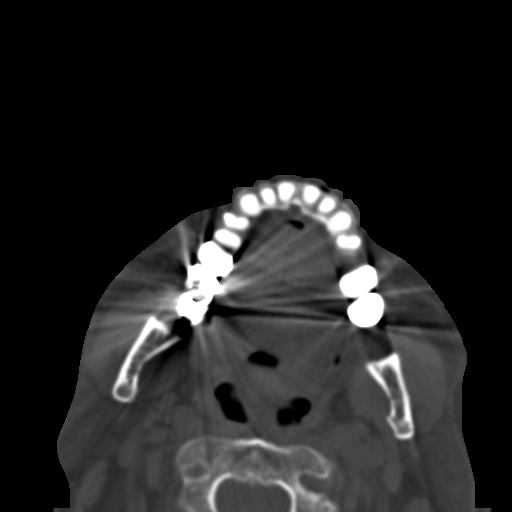
[im 40/79  bone]
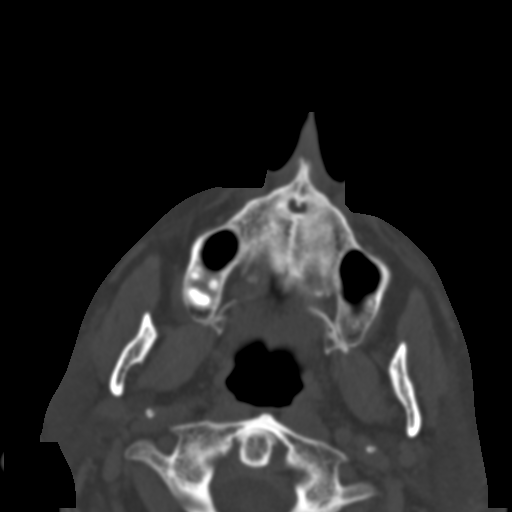
[im 47/79  bone]
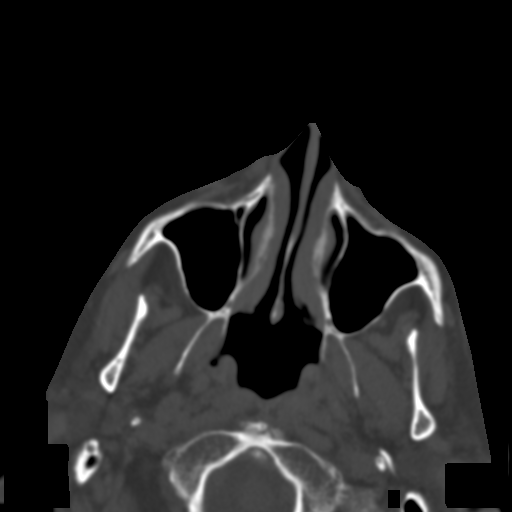
[im 51/79  bone]
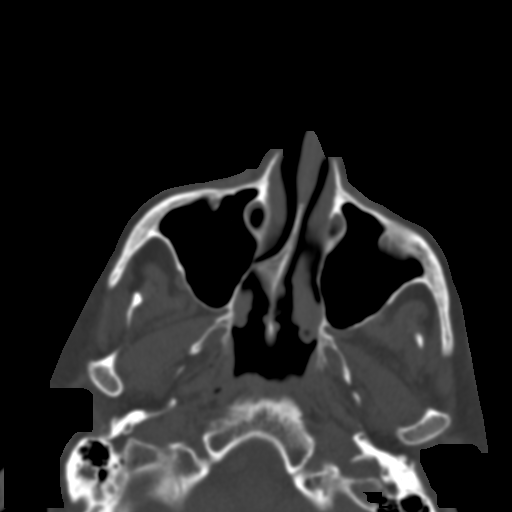
[im 59/79  brain]
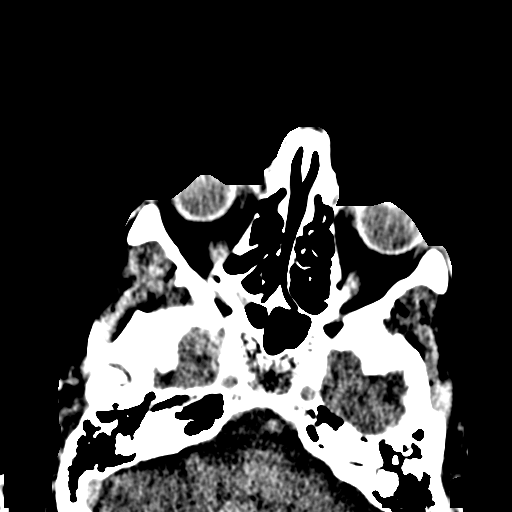
[im 59/79  bone]
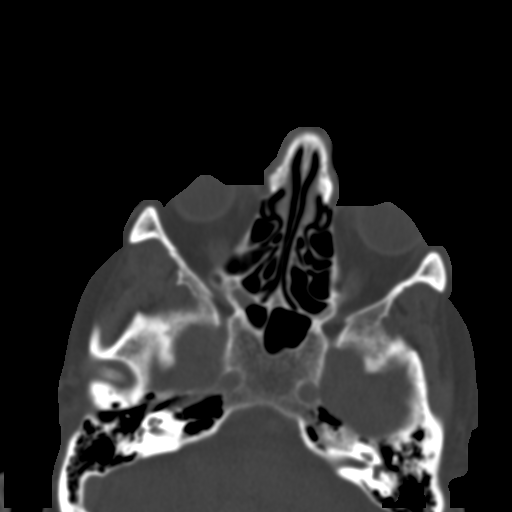
[im 67/79  bone]
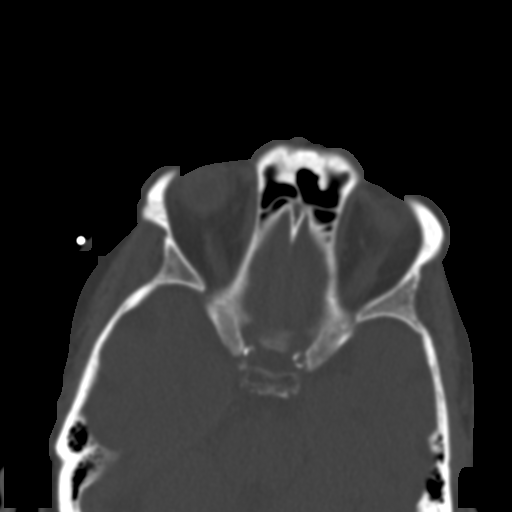
[im 75/79  bone]
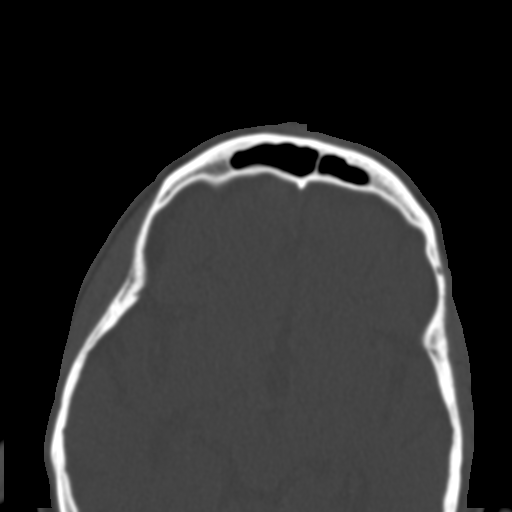

[15 of 47 positions shown; findings below may reference images not displayed]

FINDINGS: CT HEAD FINDINGS

Brain: There is central and cortical atrophy. Periventricular white
matter changes are consistent with small vessel disease. There is no
intra or extra-axial fluid collection or mass lesion. The basilar
cisterns and ventricles have a normal appearance. There is no CT
evidence for acute infarction or hemorrhage.

Vascular: There is atherosclerotic calcification of the internal
carotid arteries.

Skull: Normal. Negative for fracture or focal lesion.

Other: There is a large RIGHT frontal scalp hematoma/edema and
laceration. No underlying calvarial fracture.

CT MAXILLOFACIAL FINDINGS

Osseous: No fracture or mandibular dislocation. No destructive
process.

Orbits: Negative. No traumatic or inflammatory finding.

Sinuses: Clear.

Soft tissues: There is a laceration of the bridge of the nose, not
associated with underlying nasal bone fractures. No radiopaque
foreign body.
IMPRESSION: 1.  No evidence for acute intracranial abnormality.
2. Large RIGHT frontal scalp hematoma and edema associated with
laceration. No associated fracture.
3. Laceration of the bridge of the nose.
4. No maxillofacial fracture.
5. Internal carotid atherosclerosis.

## 2017-05-27 MED ORDER — METHOCARBAMOL 750 MG PO TABS: 750.0000 mg | ORAL_TABLET | Freq: Three times a day (TID) | ORAL | 0 refills | Status: DC | PRN

## 2017-05-27 MED ORDER — LIDOCAINE HCL (PF) 1 % IJ SOLN
30.0000 mL | Freq: Once | INTRAMUSCULAR | Status: AC
  Administered 2017-05-27: 30 mL
  Filled 2017-05-27: qty 30

## 2017-05-27 MED ORDER — HYDROCODONE-ACETAMINOPHEN 5-325 MG PO TABS: 1.0000 | ORAL_TABLET | Freq: Four times a day (QID) | ORAL | 0 refills | Status: DC | PRN

## 2017-05-27 NOTE — Discharge Instructions (Addendum)
Please have your sutures removed in 5 days.  Return if you have any concerns.

## 2017-05-27 NOTE — ED Notes (Signed)
Wound care completed.

## 2017-05-27 NOTE — ED Notes (Signed)
Patient ambulated around one block without incident

## 2017-05-27 NOTE — ED Provider Notes (Signed)
[facility] EMERGENCY DEPARTMENT Provider Note   CSN: [id number] Arrival date & time: 05/27/17  1303     History   Chief Complaint Chief Complaint  Patient presents with  . Fall    HPI Michael Ware is a 84 y.o. male.  HPI Patient presents after fall leaving church.  Was going down the stairs lost his balance and fell forward striking his face.  Has skin tears on right elbow and left knuckle.  May have had a loss conscious.  Bleeding from forehead and bridge of nose.  Also complaining of right posterior shoulder pain.  He is not on anticoagulation.  May have had a loss of consciousness after the fall.  No abdominal pain.  No chest pain.  No neck pain. Past Medical History:  Diagnosis Date  . Acute GI bleeding 01/19/13   most likely due to diverticulosis  . BPH (benign prostatic hypertrophy) with urinary obstruction   . Bradycardia   . Diverticulosis   . Elevated hemoglobin A1c   . Hyperglycemia   . Hyperlipidemia   . Hypertension   . Internal hemorrhoids with bleeding and fecal soiling 11/19/2014  . Rectal bleed 01/19/13  . Vitamin D deficiency     Patient Active Problem List   Diagnosis Date Noted  . Internal hemorrhoids with bleeding and fecal soiling 11/19/2014  . Internal hemorrhoids 10/22/2014  . Medication management 12/30/2013  . Sinus bradycardia 02/16/2013  . Rectal bleeding 01/19/2013  . Routine general medical examination at a health care facility 12/24/2012  . Essential hypertension 12/24/2012  . Mixed hyperlipidemia 12/24/2012  . Abnormal glucose 12/24/2012  . ADD (attention deficit disorder)   . Vitamin D deficiency   . Elevated hemoglobin A1c   . BPH (benign prostatic hypertrophy) with urinary obstruction   . Diverticulosis of colon 01/23/2011    Past Surgical History:  Procedure Laterality Date  . ANAL FISSURE REPAIR  1980  . HEMORRHOID BANDING    . rectal fissure procedure  1970's  . ROTATOR CUFF REPAIR     bilateral  .  TONSILLECTOMY          Home Medications    Prior to Admission medications   Medication Sig Start Date End Date Taking? Authorizing Provider  aspirin 81 MG tablet Take 81 mg by mouth every morning.     [provider]  B Complex-C (B-COMPLEX WITH VITAMIN C) tablet Take 1 tablet by mouth every morning.     [provider]  calcium carbonate (OS-CAL) 600 MG TABS tablet Take 600 mg by mouth daily with breakfast.    [provider]  Cholecalciferol (VITAMIN D3) 5000 UNITS CAPS Take 1 capsule by mouth every morning.    [provider]  CRANBERRY EXTRACT PO Take 1 capsule by mouth every morning.     [provider]  Cyanocobalamin (VITAMIN B-12 CR PO) Take 1 tablet by mouth daily.    [provider]  cyclobenzaprine (FLEXERIL) 10 MG tablet Take 10 mg by mouth 3 (three) times daily as needed. 12/26/13   [provider]  diazepam (VALIUM) 10 MG tablet Take 10 mg by mouth 3 (three) times daily as needed.     [provider]  Garlic 10 MG CAPS Take 1 capsule by mouth every morning.     [provider]  hydrocortisone (ANUSOL-HC) 25 MG suppository Place 1 suppository (25 mg total) rectally 2 (two) times daily. Patient taking differently: Place 25 mg rectally as needed.  10/05/14  Hvozdovic, Lori P, PA-C  lidocaine (LIDODERM) 5 % as needed.  12/15/13   [provider]  magnesium gluconate (MAGONATE) 500 MG tablet Take 500 mg by mouth every morning.    [provider]  meloxicam (MOBIC) 15 MG tablet Take 15 mg by mouth as needed for pain.    [provider]  Menthol-Zinc Oxide (CALMOSEPTINE) 0.44-20.6 % OINT Apply to external perirectal area 2-3 x daily x 2 weeks 10/05/14   Hvozdovic, Lori P, PA-C  methocarbamol (ROBAXIN) 750 MG tablet TAKE 1 TABLET BY MOUTH 3 TIMES A DAY Patient taking differently: TAKE 1 TABLET BY MOUTH 3 TIMES A DAY prn 12/14/13   Unk Pinto, MD  naproxen (NAPROSYN) 500 MG  tablet Take 1 tablet (500 mg total) by mouth 2 (two) times daily with a meal. 07/04/15   Leo Grosser, MD  pravastatin (PRAVACHOL) 40 MG tablet Take 40 mg by mouth every morning.     [provider]  Pyridoxine HCl (VITAMIN B-6 PO) Take 1 tablet by mouth daily.    [provider]  Saw Palmetto, Serenoa repens, (SAW PALMETTO PO) Take 1 capsule by mouth every morning.     [provider]  Selenium (SELENIMIN PO) Take 1 capsule by mouth every morning.     [provider]  Zinc 50 MG CAPS Take 1 capsule by mouth every morning.     [provider]    Family History Family History  Problem Relation Age of Onset  . Colon cancer Neg Hx   . Heart disease Mother   . Heart disease Father     Social History Social History   Tobacco Use  . Smoking status: Former Smoker    Last attempt to quit: 02/06/1998    Years since quitting: 19.3  . Smokeless tobacco: Former Network engineer Use Topics  . Alcohol use: Yes    Alcohol/week: 1.0 - 1.5 oz    Types: 2 - 3 drink(s) per week    Comment: couple drinks weekly  . Drug use: No     Allergies   Trazamine [trazodone & diet manage prod]   Review of Systems Review of Systems  Constitutional: Negative for appetite change and fever.  HENT: Negative for dental problem.   Respiratory: Negative for shortness of breath.   Cardiovascular: Negative for chest pain.  Gastrointestinal: Negative for abdominal pain.  Genitourinary: Negative for flank pain.  Musculoskeletal: Positive for back pain.  Skin: Positive for wound.  Neurological: Negative for weakness and numbness.  Hematological: Negative for adenopathy.  Psychiatric/Behavioral: Negative for confusion.     Physical Exam Updated Vital Signs BP (!) 182/62 (BP Location: Right Arm)   Temp 98.7 F (37.1 C) (Oral)   Resp 18   SpO2 98%   Physical Exam  Constitutional: He appears well-developed.  HENT:  Curved laceration on mid forehead  approximately 5 cm long.  Also approximately 2 cm laceration on right side of bridge of nose.  Eyes: Pupils are equal, round, and reactive to light.  Neck: Neck supple.  Cardiovascular: Normal rate.  Pulmonary/Chest: Effort normal. He exhibits no tenderness.  Abdominal: Soft. There is no tenderness.  Musculoskeletal:  Skin tear right lateral elbow without underlying bony tenderness.  Good range of motion elbow.  Good range of motion right shoulder.  Neurovascular intact right hand.  Patient states he does have posterior right shoulder pain.  Not tender.  Neurological: He is alert. No sensory deficit.  Skin: Skin is warm. Capillary refill  takes less than 2 seconds.  Psychiatric: He has a normal mood and affect.     ED Treatments / Results  Labs (all labs ordered are listed, but only abnormal results are displayed) Labs Reviewed - No data to display  EKG None  Radiology Dg Chest 2 View  Result Date: 05/27/2017 CLINICAL DATA:  Right anterior lower chest pain following a fall down steps today. Right shoulder pain. EXAM: CHEST - 2 VIEW COMPARISON:  Chest CT without contrast dated 11/27/2016. FINDINGS: Mildly enlarged cardiac silhouette. Mildly hyperexpanded lungs. The lungs are clear with mild diffuse peribronchial thickening. Thoracic spine degenerative changes. No fracture or pneumothorax seen. IMPRESSION: 1. No acute abnormality. 2. Mild cardiomegaly and mild changes of COPD and chronic bronchitis. Electronically Signed   By: Claudie Revering M.D.   On: 05/27/2017 14:59   Dg Shoulder Right  Result Date: 05/27/2017 CLINICAL DATA:  Fall. Right shoulder pain. History of bilateral rib fractures. Ex-smoker. EXAM: RIGHT SHOULDER - 2+ VIEW COMPARISON:  None. FINDINGS: AP and scapular Y-views. No acute fracture or dislocation. Suspect right apical pleuroparenchymal scarring, better evaluated on dedicated chest radiograph. IMPRESSION: No acute osseous abnormality. Electronically Signed   By: Abigail Miyamoto M.D.   On: 05/27/2017 15:00    Procedures Procedures (including critical care time)  Medications Ordered in ED Medications  lidocaine (PF) (XYLOCAINE) 1 % injection 30 mL (has no administration in time range)     Initial Impression / Assessment and Plan / ED Course  I have reviewed the triage vital signs and the nursing notes.  Pertinent labs & imaging results that were available during my care of the patient were reviewed by me and considered in my medical decision making (see chart for details).    Patient with fall.  Facial lacerations.  Imaging pending.  Wound will be closed.  Appears to be a mechanical fall.  Tetanus is up-to-date.  Care will be turned over to oncoming physician. Final Clinical Impressions(s) / ED Diagnoses   Final diagnoses:  Fall, initial encounter  Facial laceration, initial encounter  Nasal laceration, initial encounter  Contusion of right shoulder, initial encounter    ED Discharge Orders    None       Davonna Belling, MD 05/27/17 1507

## 2017-05-27 NOTE — ED Provider Notes (Signed)
Laceration on forehead and nose was repaired by me at the request of Dr. Alvino Chapel.   LACERATION REPAIR Performed by: Domenic Moras Authorized by: Domenic Moras Consent: Verbal consent obtained. Risks and benefits: risks, benefits and alternatives were discussed Consent given by: patient Patient identity confirmed: provided demographic data Prepped and Draped in normal sterile fashion Wound explored  Laceration Location: Mid forehead  Laceration Length: 5cm, depth 20mm  No Foreign Bodies seen or palpated  Anesthesia: local infiltration  Local anesthetic: lidocaine 1% w/o epinephrine  Anesthetic total: 4 ml  Irrigation method: syringe Amount of cleaning: standard  Skin closure: prolene 5.0  Number of sutures: 10  Technique: simple interrupted, undermining and skin debride using sterile scissor  Patient tolerance: Patient tolerated the procedure well with no immediate complications.  LACERATION REPAIR Performed by: Domenic Moras Authorized by: Domenic Moras Consent: Verbal consent obtained. Risks and benefits: risks, benefits and alternatives were discussed Consent given by: patient Patient identity confirmed: provided demographic data Prepped and Draped in normal sterile fashion Wound explored  Laceration Location: bridge of nose  Laceration Length: 2cm  No Foreign Bodies seen or palpated  Anesthesia: local infiltration  Local anesthetic: lidocaine 1% w/o epinephrine  Anesthetic total: 2 ml  Irrigation method: syringe Amount of cleaning: standard  Skin closure: prolene 5.0  Number of sutures: 4  Technique: simple interrupted  Patient tolerance: Patient tolerated the procedure well with no immediate complications.  BP (!) 182/62 (BP Location: Right Arm)   Temp 98.7 F (37.1 C) (Oral)   Resp 18   SpO2 98%   Results for orders placed or performed in visit on 10/05/14  CBC with Differential/Platelet  Result Value Ref Range   WBC 10.1 4.0 - 10.5 K/uL   RBC  4.62 4.22 - 5.81 Mil/uL   Hemoglobin 16.7 13.0 - 17.0 g/dL   HCT 48.6 39.0 - 52.0 %   MCV 105.2 (H) 78.0 - 100.0 fl   MCHC 34.5 30.0 - 36.0 g/dL   RDW 13.5 11.5 - 15.5 %   Platelets 248.0 150.0 - 400.0 K/uL   Neutrophils Relative % 73.5 43.0 - 77.0 %   Lymphocytes Relative 17.7 12.0 - 46.0 %   Monocytes Relative 7.8 3.0 - 12.0 %   Eosinophils Relative 0.5 0.0 - 5.0 %   Basophils Relative 0.5 0.0 - 3.0 %   Neutro Abs 7.5 1.4 - 7.7 K/uL   Lymphs Abs 1.8 0.7 - 4.0 K/uL   Monocytes Absolute 0.8 0.1 - 1.0 K/uL   Eosinophils Absolute 0.1 0.0 - 0.7 K/uL   Basophils Absolute 0.0 0.0 - 0.1 K/uL   Dg Chest 2 View  Result Date: 05/27/2017 CLINICAL DATA:  Right anterior lower chest pain following a fall down steps today. Right shoulder pain. EXAM: CHEST - 2 VIEW COMPARISON:  Chest CT without contrast dated 11/27/2016. FINDINGS: Mildly enlarged cardiac silhouette. Mildly hyperexpanded lungs. The lungs are clear with mild diffuse peribronchial thickening. Thoracic spine degenerative changes. No fracture or pneumothorax seen. IMPRESSION: 1. No acute abnormality. 2. Mild cardiomegaly and mild changes of COPD and chronic bronchitis. Electronically Signed   By: Claudie Revering M.D.   On: 05/27/2017 14:59   Dg Shoulder Right  Result Date: 05/27/2017 CLINICAL DATA:  Fall. Right shoulder pain. History of bilateral rib fractures. Ex-smoker. EXAM: RIGHT SHOULDER - 2+ VIEW COMPARISON:  None. FINDINGS: AP and scapular Y-views. No acute fracture or dislocation. Suspect right apical pleuroparenchymal scarring, better evaluated on dedicated chest radiograph. IMPRESSION: No acute osseous abnormality. Electronically  Signed   By: Abigail Miyamoto M.D.   On: 05/27/2017 15:00   Ct Head Wo Contrast  Result Date: 05/27/2017 CLINICAL DATA:  Fall. Loss of consciousness. [place] coming [place] stairs on church. Patient hit his forehead on some pain. EXAM: CT HEAD WITHOUT CONTRAST CT MAXILLOFACIAL WITHOUT CONTRAST TECHNIQUE:  Multidetector CT imaging of the head and maxillofacial structures were performed using the standard protocol without intravenous contrast. Multiplanar CT image reconstructions of the maxillofacial structures were also generated. COMPARISON:  None. FINDINGS: CT HEAD FINDINGS Brain: There is central and cortical atrophy. Periventricular white matter changes are consistent with small vessel disease. There is no intra or extra-axial fluid collection or mass lesion. The basilar cisterns and ventricles have a normal appearance. There is no CT evidence for acute infarction or hemorrhage. Vascular: There is atherosclerotic calcification of the internal carotid arteries. Skull: Normal. Negative for fracture or focal lesion. Other: There is a large RIGHT frontal scalp hematoma/edema and laceration. No underlying calvarial fracture. CT MAXILLOFACIAL FINDINGS Osseous: No fracture or mandibular dislocation. No destructive process. Orbits: Negative. No traumatic or inflammatory finding. Sinuses: Clear. Soft tissues: There is a laceration of the bridge of the nose, not associated with underlying nasal bone fractures. No radiopaque foreign body. IMPRESSION: 1.  No evidence for acute intracranial abnormality. 2. Large RIGHT frontal scalp hematoma and edema associated with laceration. No associated fracture. 3. Laceration of the bridge of the nose. 4. No maxillofacial fracture. 5. Internal carotid atherosclerosis. Electronically Signed   By: Nolon Nations M.D.   On: 05/27/2017 15:53   Ct Maxillofacial Wo Contrast  Result Date: 05/27/2017 CLINICAL DATA:  Fall. Loss of consciousness. [place] stairs on [place]. Patient hit his forehead on some pain. EXAM: CT HEAD WITHOUT CONTRAST CT MAXILLOFACIAL WITHOUT CONTRAST TECHNIQUE: Multidetector CT imaging of the head and maxillofacial structures were performed using the standard protocol without intravenous contrast. Multiplanar CT image reconstructions of the maxillofacial  structures were also generated. COMPARISON:  None. FINDINGS: CT HEAD FINDINGS Brain: There is central and cortical atrophy. Periventricular white matter changes are consistent with small vessel disease. There is no intra or extra-axial fluid collection or mass lesion. The basilar cisterns and ventricles have a normal appearance. There is no CT evidence for acute infarction or hemorrhage. Vascular: There is atherosclerotic calcification of the internal carotid arteries. Skull: Normal. Negative for fracture or focal lesion. Other: There is a large RIGHT frontal scalp hematoma/edema and laceration. No underlying calvarial fracture. CT MAXILLOFACIAL FINDINGS Osseous: No fracture or mandibular dislocation. No destructive process. Orbits: Negative. No traumatic or inflammatory finding. Sinuses: Clear. Soft tissues: There is a laceration of the bridge of the nose, not associated with underlying nasal bone fractures. No radiopaque foreign body. IMPRESSION: 1.  No evidence for acute intracranial abnormality. 2. Large RIGHT frontal scalp hematoma and edema associated with laceration. No associated fracture. 3. Laceration of the bridge of the nose. 4. No maxillofacial fracture. 5. Internal carotid atherosclerosis. Electronically Signed   By: Nolon Nations M.D.   On: 05/27/2017 15:53      Domenic Moras, PA-C 05/27/17 1634    Davonna Belling, MD 05/29/17 1537

## 2017-05-27 NOTE — ED Triage Notes (Signed)
Patient arrived via EMS. Patient fell coming down stairs from church.Patient hit his fore-head on "something". Reports right elbow, Right-shoulder blade and left knuckle hurts-3 on a zero to 10 scale. Patient reports that he only takes cholesterol medicine

## 2017-05-27 NOTE — ED Notes (Signed)
ED Provider at bedside. 

## 2017-05-30 DIAGNOSIS — M25511 Pain in right shoulder: Secondary | ICD-10-CM | POA: Diagnosis not present

## 2017-05-30 DIAGNOSIS — R0781 Pleurodynia: Secondary | ICD-10-CM | POA: Diagnosis not present

## 2017-05-31 ENCOUNTER — Other Ambulatory Visit: Payer: Self-pay | Admitting: Orthopedic Surgery

## 2017-05-31 DIAGNOSIS — R0781 Pleurodynia: Secondary | ICD-10-CM

## 2017-06-01 ENCOUNTER — Ambulatory Visit
Admission: RE | Admit: 2017-06-01 | Discharge: 2017-06-01 | Disposition: A | Discharge status: Home / Self Care | Payer: Medicare Other | Source: Ambulatory Visit | Attending: Orthopedic Surgery | Admitting: Orthopedic Surgery

## 2017-06-01 ENCOUNTER — Other Ambulatory Visit: Payer: Self-pay | Admitting: Orthopedic Surgery

## 2017-06-01 DIAGNOSIS — S4991XA Unspecified injury of right shoulder and upper arm, initial encounter: Secondary | ICD-10-CM | POA: Diagnosis not present

## 2017-06-01 DIAGNOSIS — M25511 Pain in right shoulder: Secondary | ICD-10-CM

## 2017-06-01 DIAGNOSIS — R0781 Pleurodynia: Secondary | ICD-10-CM

## 2017-06-01 DIAGNOSIS — S2241XA Multiple fractures of ribs, right side, initial encounter for closed fracture: Secondary | ICD-10-CM | POA: Diagnosis not present

## 2017-06-01 IMAGING — CT CT CHEST W/O CM
2 of 4 series · 12 of 36 positions shown, 15 images · non-contrast
Comparison: [DATE] chest CT.  [DATE] chest radiograph.

CLINICAL DATA: 85 y/o M; right rib pain, possible rib fracture.
Right shoulder pain. Fell down a flight of stairs.

EXAM:
CT CHEST WITHOUT CONTRAST
TECHNIQUE: Multidetector CT imaging of the chest was performed following the
standard protocol without IV contrast.

[Series 3: chest 2.00 br40 s3 ax · axial · 0.60mm/px · z∈[-1332,-1040]mm · 9 of 172 slices shown, 12 images]
[im 13/172  mediastinal]
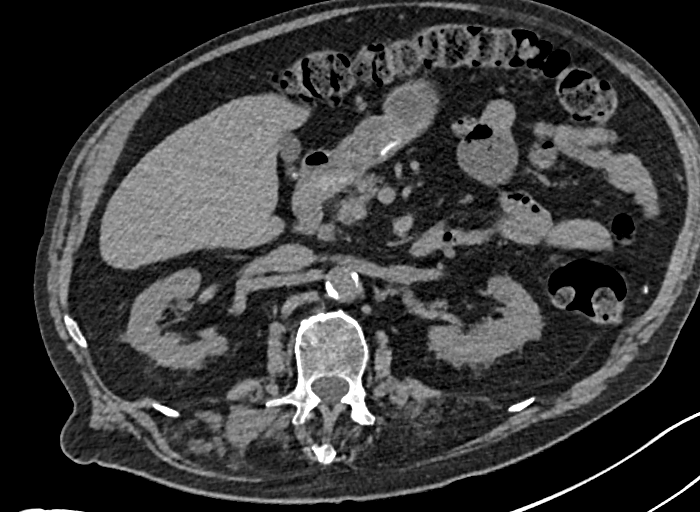
[im 13/172  lung]
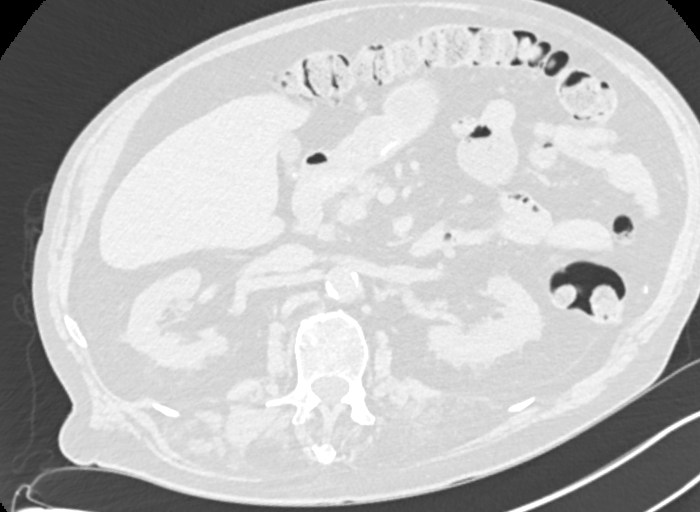
[im 37/172  lung]
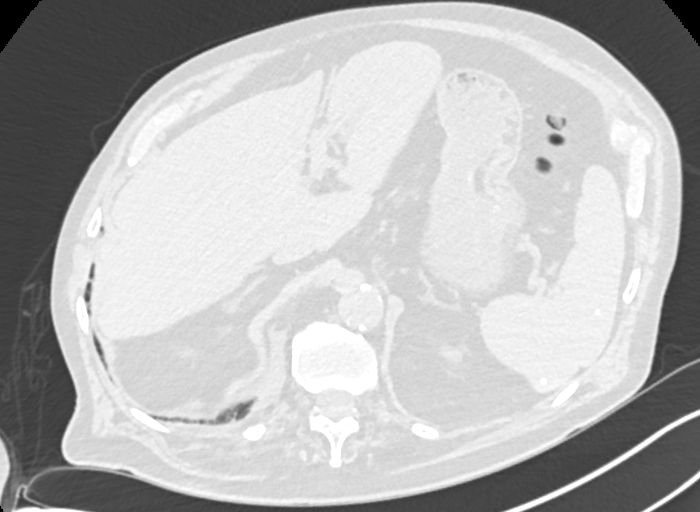
[im 49/172  lung]
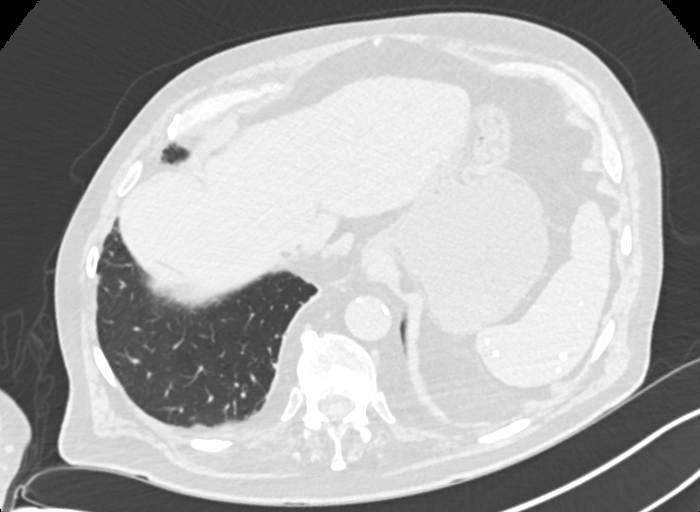
[im 74/172  lung]
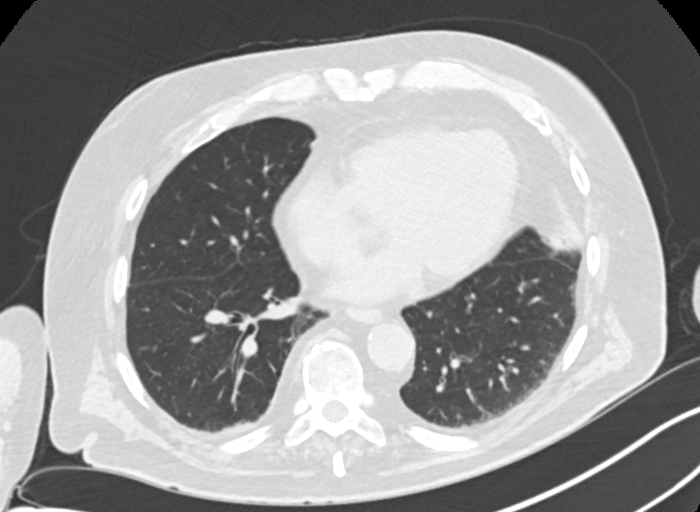
[im 86/172  mediastinal]
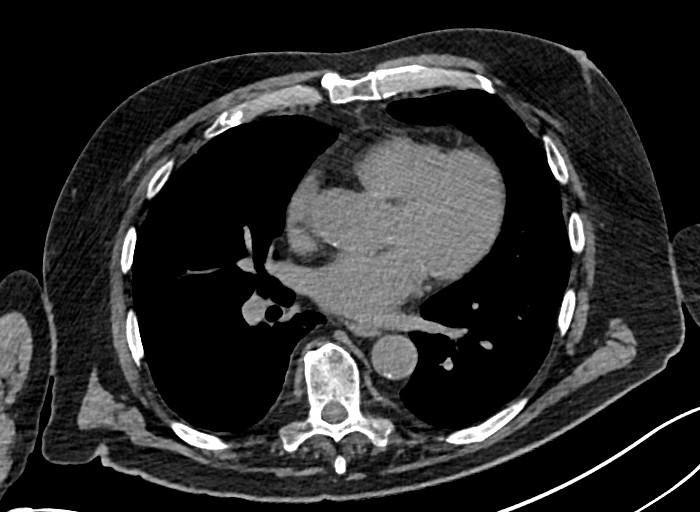
[im 86/172  lung]
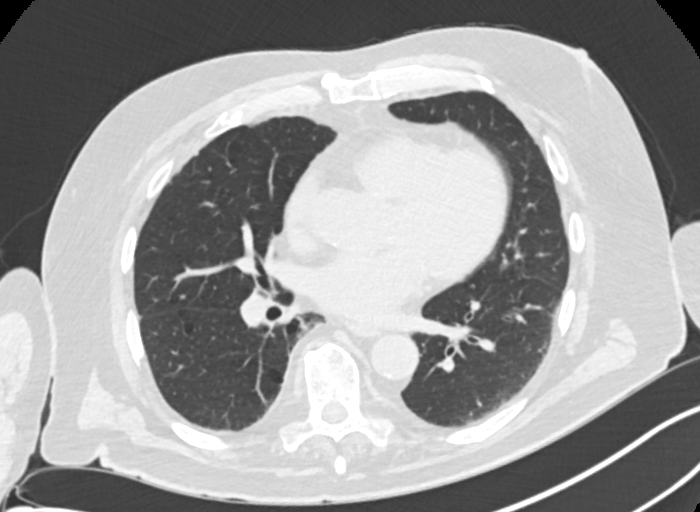
[im 98/172  lung]
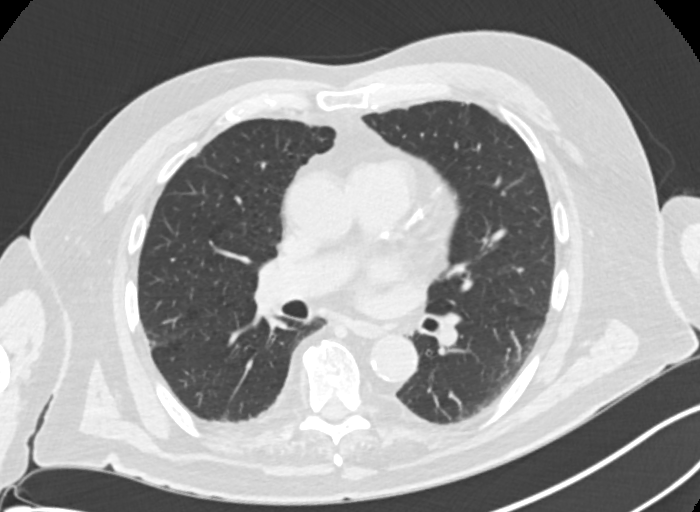
[im 123/172  lung]
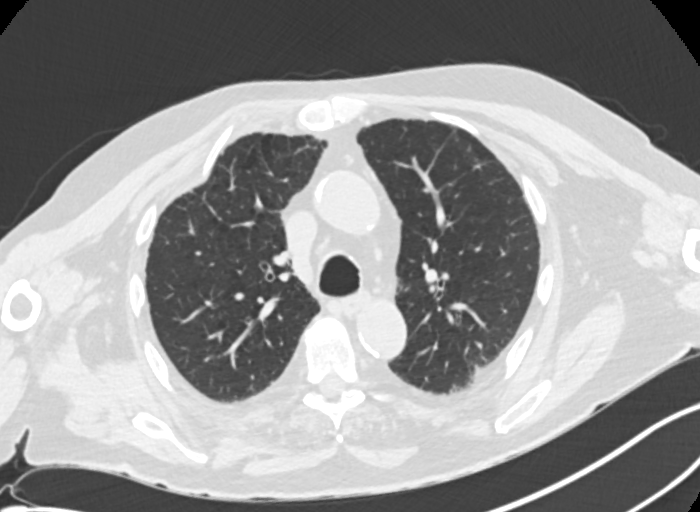
[im 135/172  lung]
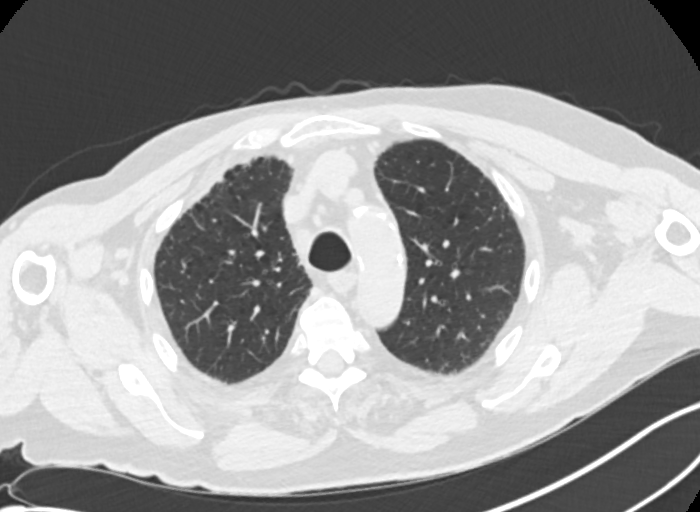
[im 159/172  mediastinal]
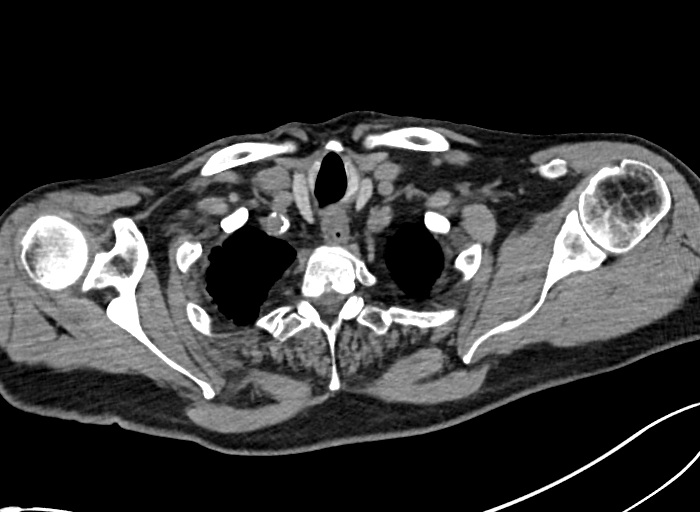
[im 159/172  lung]
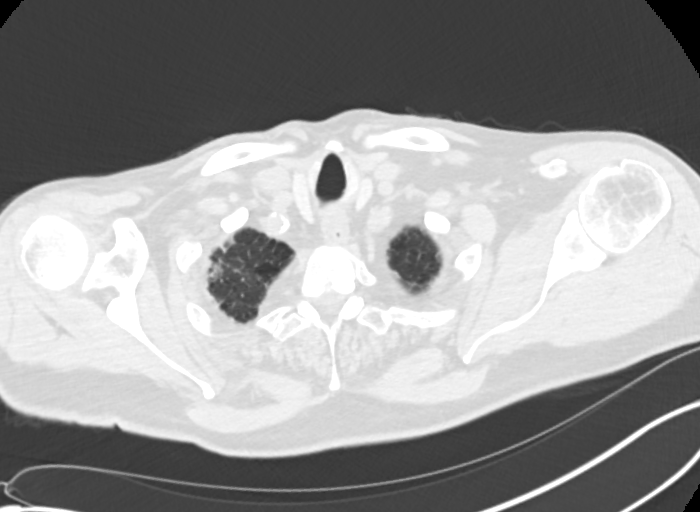

[Series 5: chest 2.00 br40 s3 cor · coronal · 0.67mm/px · 3 of 153 slices shown]
[im 31/153  lung]
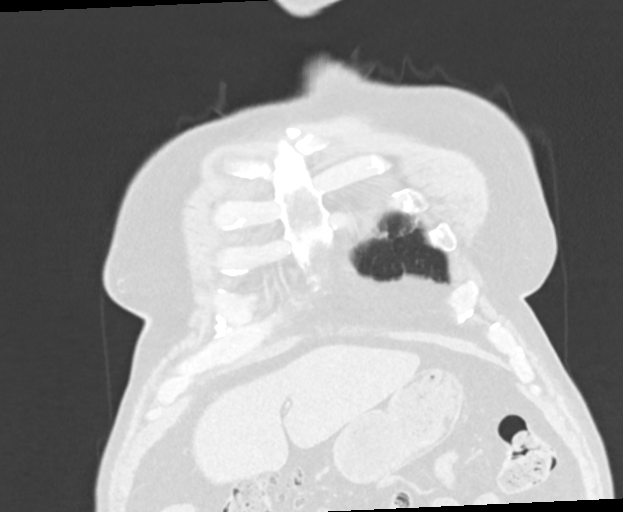
[im 61/153  lung]
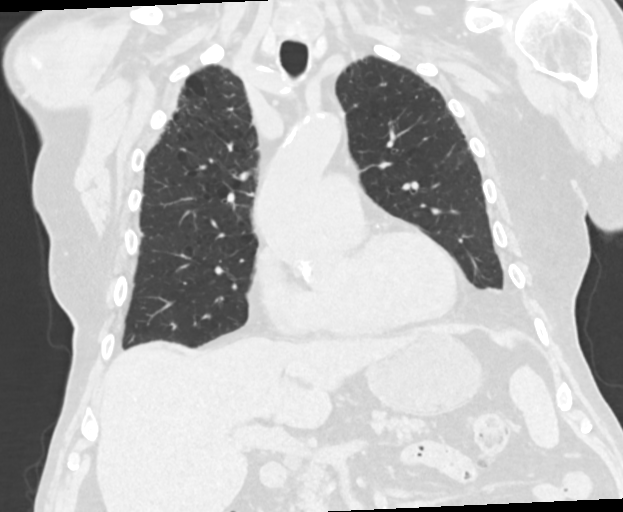
[im 92/153  lung]
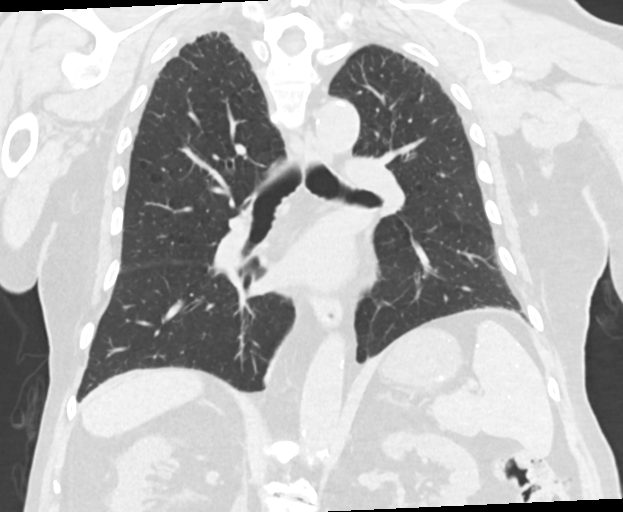

[12 of 36 positions shown; findings below may reference images not displayed]

FINDINGS: Cardiovascular: No significant vascular findings. Normal heart size.
No pericardial effusion. Severe aortic and coronary artery calcific
atherosclerosis.

Mediastinum/Nodes: No enlarged mediastinal or axillary lymph nodes.
Thyroid gland, trachea, and esophagus demonstrate no significant
findings.

Lungs/Pleura: Lungs are clear. No pleural effusion or pneumothorax.
Scattered calcified granuloma. Mild centrilobular emphysema of
lungs.

Upper Abdomen: Cholelithiasis. Right kidney interpolar cyst
measuring 27 mm. Calcified granulomata throughout the spleen. Small
transhiatal fat containing hernia.

Musculoskeletal: Minimally displaced acute fractures of right
anterolateral 2-5 ribs and mildly displaced acute fractures of the
right posterior 3-8 ribs
IMPRESSION: 1. Minimally displaced acute right anterolateral 2-5 rib fractures.
Mildly displaced acute right posterior 3-8 rib fractures. No
pneumothorax.
2. Severe coronary and artery and aortic calcific atherosclerosis.
3. Mild emphysema of lungs.
4. Cholelithiasis.
5. Small transhiatal fat containing hernia.

By: JIMPA M.D.

## 2017-06-01 IMAGING — CT CT SHOULDER*R* W/O CM
2 series · 10 of 14 positions shown, 12 images · non-contrast
Comparison: Right shoulder x-rays dated [DATE].

CLINICAL DATA: Right shoulder pain after fall.

EXAM:
CT OF THE UPPER RIGHT EXTREMITY WITHOUT CONTRAST
TECHNIQUE: Multidetector CT imaging of the upper right extremity was performed
according to the standard protocol.

[Series 11: shoulder 2.00 br40 s3 ax · axial · 0.50mm/px · z∈[-1162,-1026]mm · 5 of 103 slices shown, 7 images]
[im 18/103  soft-tissue]
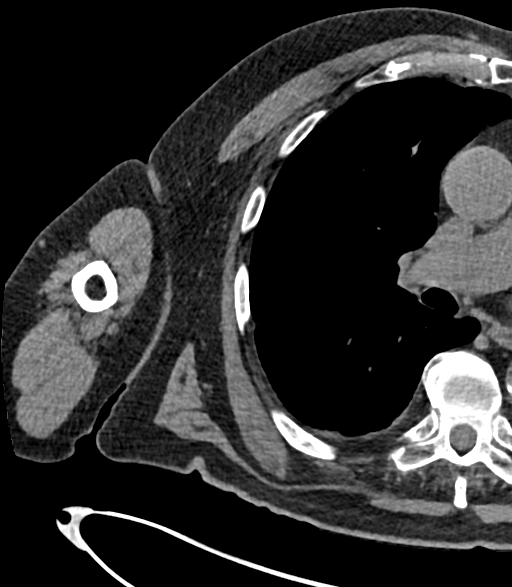
[im 18/103  bone]
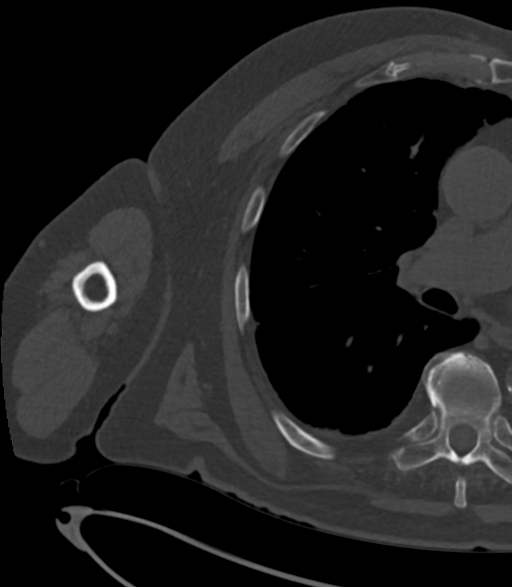
[im 35/103  bone]
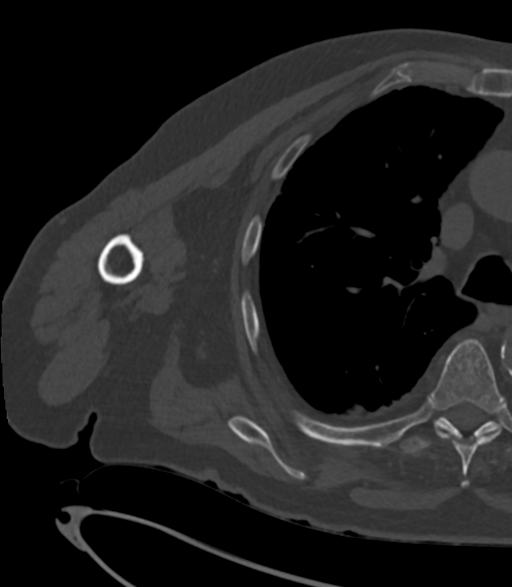
[im 52/103  bone]
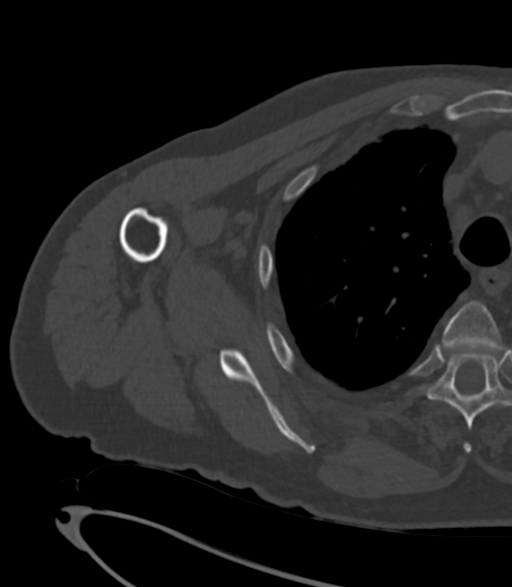
[im 69/103  bone]
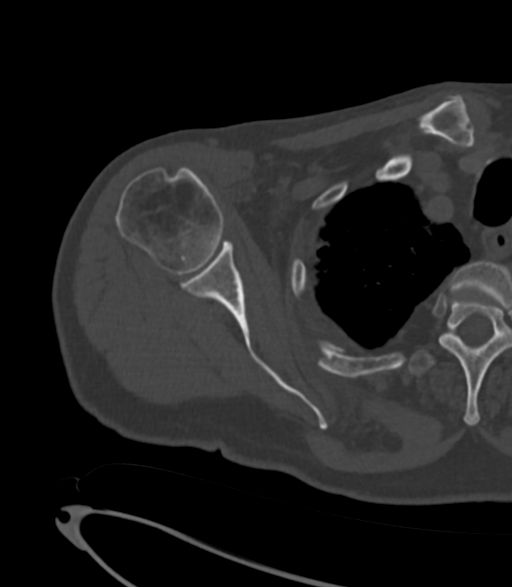
[im 86/103  soft-tissue]
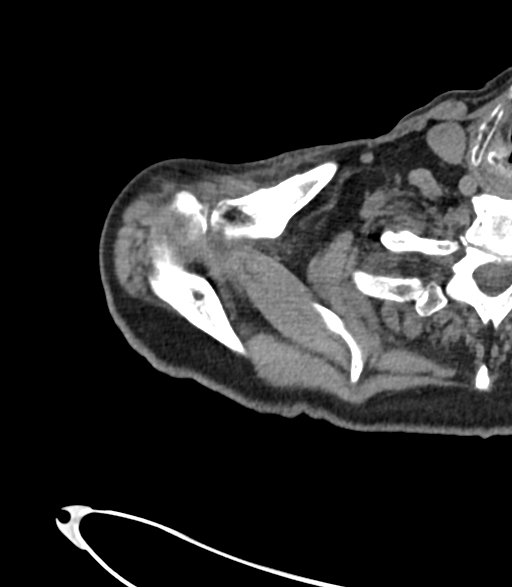
[im 86/103  bone]
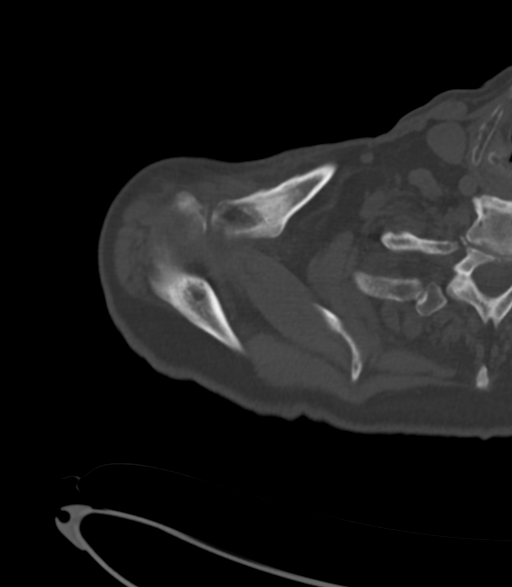

[Series 13: shoulder 2.00 br60 s3 ax · axial · 0.50mm/px · z∈[-1162,-1026]mm · 5 of 103 slices shown]
[im 18/103  bone]
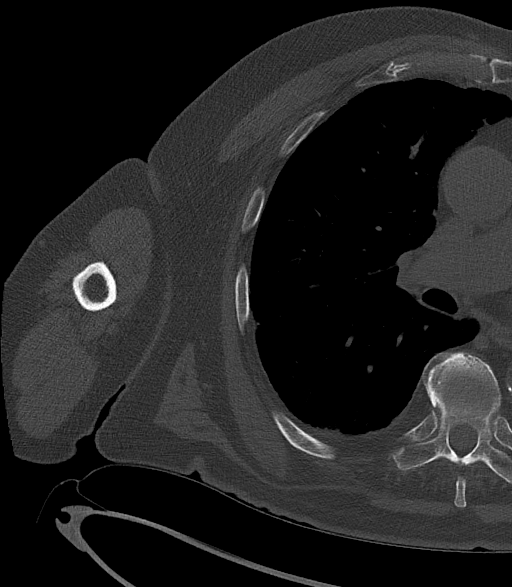
[im 35/103  bone]
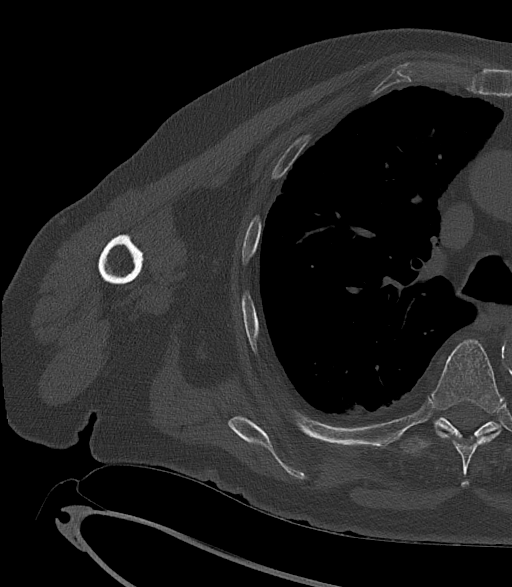
[im 52/103  bone]
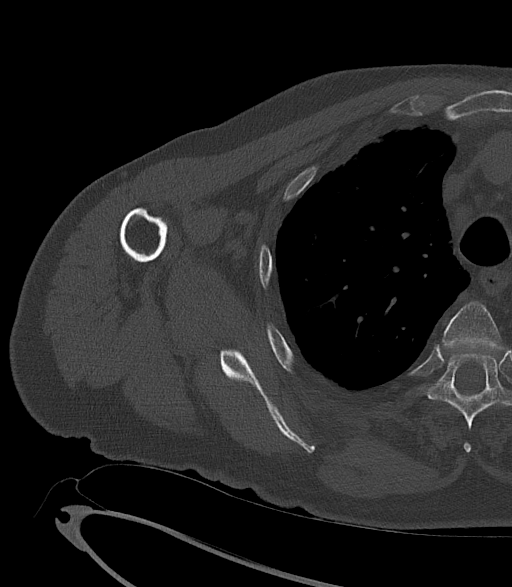
[im 69/103  bone]
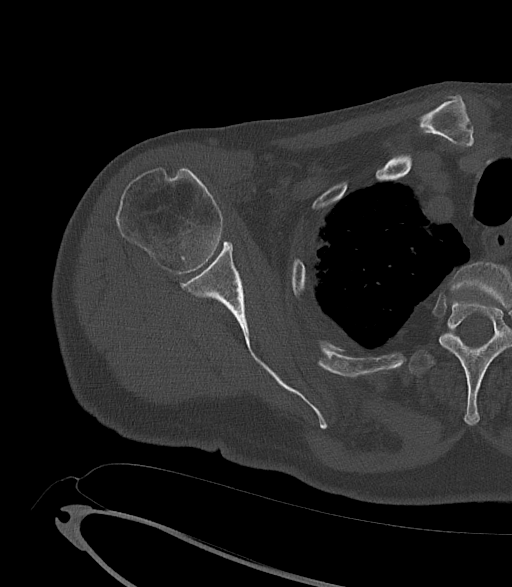
[im 86/103  bone]
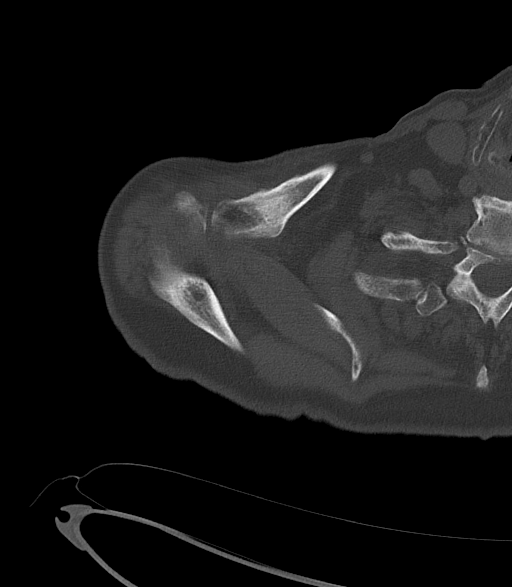

[10 of 14 positions shown; findings below may reference images not displayed]

FINDINGS: Bones/Joint/Cartilage

Acute fractures of the right anterior and posterior third, fourth,
and fifth ribs, as well as the posterior sixth and eighth ribs. No
humerus or scapular fracture. Mild glenohumeral acromioclavicular
joint space narrowing. No joint effusion.

Ligaments

Suboptimally assessed by CT.

Muscles and Tendons

No muscle atrophy.  The rotator cuff is grossly intact.

Soft tissues

Unremarkable.  Mild emphysema in the lungs.
IMPRESSION: 1. No acute osseous abnormality of the right shoulder. Mild
acromioclavicular and glenohumeral osteoarthritis.
2. Multiple right-sided rib fractures. Please see separate CT chest
from same day for further evaluation.

## 2017-06-05 DIAGNOSIS — S0181XA Laceration without foreign body of other part of head, initial encounter: Secondary | ICD-10-CM | POA: Diagnosis not present

## 2017-06-05 DIAGNOSIS — M545 Low back pain: Secondary | ICD-10-CM | POA: Diagnosis not present

## 2017-06-05 DIAGNOSIS — R0781 Pleurodynia: Secondary | ICD-10-CM | POA: Diagnosis not present

## 2017-06-12 DIAGNOSIS — M5136 Other intervertebral disc degeneration, lumbar region: Secondary | ICD-10-CM | POA: Diagnosis not present

## 2017-06-12 DIAGNOSIS — Z79891 Long term (current) use of opiate analgesic: Secondary | ICD-10-CM | POA: Diagnosis not present

## 2017-06-12 DIAGNOSIS — M545 Low back pain: Secondary | ICD-10-CM | POA: Diagnosis not present

## 2017-06-15 DIAGNOSIS — S0003XD Contusion of scalp, subsequent encounter: Secondary | ICD-10-CM | POA: Diagnosis not present

## 2017-06-25 DIAGNOSIS — S2241XS Multiple fractures of ribs, right side, sequela: Secondary | ICD-10-CM | POA: Diagnosis not present

## 2017-07-03 DIAGNOSIS — M5136 Other intervertebral disc degeneration, lumbar region: Secondary | ICD-10-CM | POA: Diagnosis not present

## 2017-08-24 DIAGNOSIS — M545 Low back pain: Secondary | ICD-10-CM | POA: Diagnosis not present

## 2017-08-24 DIAGNOSIS — M5136 Other intervertebral disc degeneration, lumbar region: Secondary | ICD-10-CM | POA: Diagnosis not present

## 2017-09-27 DIAGNOSIS — Z79891 Long term (current) use of opiate analgesic: Secondary | ICD-10-CM | POA: Diagnosis not present

## 2017-09-27 DIAGNOSIS — M545 Low back pain: Secondary | ICD-10-CM | POA: Diagnosis not present

## 2017-09-27 DIAGNOSIS — M5136 Other intervertebral disc degeneration, lumbar region: Secondary | ICD-10-CM | POA: Diagnosis not present

## 2017-10-10 DIAGNOSIS — L218 Other seborrheic dermatitis: Secondary | ICD-10-CM | POA: Diagnosis not present

## 2017-10-10 DIAGNOSIS — D1801 Hemangioma of skin and subcutaneous tissue: Secondary | ICD-10-CM | POA: Diagnosis not present

## 2017-10-10 DIAGNOSIS — L821 Other seborrheic keratosis: Secondary | ICD-10-CM | POA: Diagnosis not present

## 2017-10-10 DIAGNOSIS — D229 Melanocytic nevi, unspecified: Secondary | ICD-10-CM | POA: Diagnosis not present

## 2017-10-10 DIAGNOSIS — L570 Actinic keratosis: Secondary | ICD-10-CM | POA: Diagnosis not present

## 2017-10-10 DIAGNOSIS — Z85828 Personal history of other malignant neoplasm of skin: Secondary | ICD-10-CM | POA: Diagnosis not present

## 2017-11-01 DIAGNOSIS — Z23 Encounter for immunization: Secondary | ICD-10-CM | POA: Diagnosis not present

## 2017-11-12 DIAGNOSIS — E7800 Pure hypercholesterolemia, unspecified: Secondary | ICD-10-CM | POA: Diagnosis not present

## 2017-11-12 DIAGNOSIS — Z23 Encounter for immunization: Secondary | ICD-10-CM | POA: Diagnosis not present

## 2017-11-12 DIAGNOSIS — Z0000 Encounter for general adult medical examination without abnormal findings: Secondary | ICD-10-CM | POA: Diagnosis not present

## 2017-11-12 DIAGNOSIS — M545 Low back pain: Secondary | ICD-10-CM | POA: Diagnosis not present

## 2017-11-12 DIAGNOSIS — G4700 Insomnia, unspecified: Secondary | ICD-10-CM | POA: Diagnosis not present

## 2017-12-03 DIAGNOSIS — M5137 Other intervertebral disc degeneration, lumbosacral region: Secondary | ICD-10-CM | POA: Diagnosis not present

## 2017-12-03 DIAGNOSIS — Z79891 Long term (current) use of opiate analgesic: Secondary | ICD-10-CM | POA: Diagnosis not present

## 2017-12-03 DIAGNOSIS — M48061 Spinal stenosis, lumbar region without neurogenic claudication: Secondary | ICD-10-CM | POA: Diagnosis not present

## 2017-12-03 DIAGNOSIS — M545 Low back pain: Secondary | ICD-10-CM | POA: Diagnosis not present

## 2017-12-03 DIAGNOSIS — M5136 Other intervertebral disc degeneration, lumbar region: Secondary | ICD-10-CM | POA: Diagnosis not present

## 2017-12-13 DIAGNOSIS — M545 Low back pain: Secondary | ICD-10-CM | POA: Diagnosis not present

## 2017-12-17 DIAGNOSIS — M48061 Spinal stenosis, lumbar region without neurogenic claudication: Secondary | ICD-10-CM | POA: Diagnosis not present

## 2017-12-17 DIAGNOSIS — M5136 Other intervertebral disc degeneration, lumbar region: Secondary | ICD-10-CM | POA: Diagnosis not present

## 2017-12-17 DIAGNOSIS — M545 Low back pain: Secondary | ICD-10-CM | POA: Diagnosis not present

## 2018-02-14 DIAGNOSIS — M545 Low back pain: Secondary | ICD-10-CM | POA: Diagnosis not present

## 2018-04-12 DIAGNOSIS — M5136 Other intervertebral disc degeneration, lumbar region: Secondary | ICD-10-CM | POA: Diagnosis not present

## 2018-04-12 DIAGNOSIS — M5137 Other intervertebral disc degeneration, lumbosacral region: Secondary | ICD-10-CM | POA: Diagnosis not present

## 2018-04-12 DIAGNOSIS — Z79891 Long term (current) use of opiate analgesic: Secondary | ICD-10-CM | POA: Diagnosis not present

## 2018-04-12 DIAGNOSIS — M545 Low back pain: Secondary | ICD-10-CM | POA: Diagnosis not present

## 2018-04-22 DIAGNOSIS — L570 Actinic keratosis: Secondary | ICD-10-CM | POA: Diagnosis not present

## 2018-04-22 DIAGNOSIS — L218 Other seborrheic dermatitis: Secondary | ICD-10-CM | POA: Diagnosis not present

## 2018-04-22 DIAGNOSIS — L578 Other skin changes due to chronic exposure to nonionizing radiation: Secondary | ICD-10-CM | POA: Diagnosis not present

## 2018-04-23 DIAGNOSIS — M545 Low back pain: Secondary | ICD-10-CM | POA: Diagnosis not present

## 2018-05-06 DIAGNOSIS — M545 Low back pain: Secondary | ICD-10-CM | POA: Diagnosis not present

## 2018-05-15 DIAGNOSIS — M545 Low back pain: Secondary | ICD-10-CM | POA: Diagnosis not present

## 2018-05-22 DIAGNOSIS — M545 Low back pain: Secondary | ICD-10-CM | POA: Diagnosis not present

## 2018-05-29 DIAGNOSIS — M545 Low back pain: Secondary | ICD-10-CM | POA: Diagnosis not present

## 2018-06-25 DIAGNOSIS — M7061 Trochanteric bursitis, right hip: Secondary | ICD-10-CM | POA: Diagnosis not present

## 2018-06-25 DIAGNOSIS — G894 Chronic pain syndrome: Secondary | ICD-10-CM | POA: Diagnosis not present

## 2018-06-25 DIAGNOSIS — M48061 Spinal stenosis, lumbar region without neurogenic claudication: Secondary | ICD-10-CM | POA: Diagnosis not present

## 2018-06-25 DIAGNOSIS — M5136 Other intervertebral disc degeneration, lumbar region: Secondary | ICD-10-CM | POA: Diagnosis not present

## 2018-06-25 DIAGNOSIS — M5137 Other intervertebral disc degeneration, lumbosacral region: Secondary | ICD-10-CM | POA: Diagnosis not present

## 2018-07-17 DIAGNOSIS — G894 Chronic pain syndrome: Secondary | ICD-10-CM | POA: Diagnosis not present

## 2018-07-22 DIAGNOSIS — M25551 Pain in right hip: Secondary | ICD-10-CM | POA: Diagnosis not present

## 2018-08-08 DIAGNOSIS — M7061 Trochanteric bursitis, right hip: Secondary | ICD-10-CM | POA: Diagnosis not present

## 2018-08-26 DIAGNOSIS — M25562 Pain in left knee: Secondary | ICD-10-CM | POA: Diagnosis not present

## 2018-08-26 DIAGNOSIS — M25561 Pain in right knee: Secondary | ICD-10-CM | POA: Diagnosis not present

## 2018-09-13 DIAGNOSIS — G894 Chronic pain syndrome: Secondary | ICD-10-CM | POA: Diagnosis not present

## 2018-09-13 DIAGNOSIS — M545 Low back pain: Secondary | ICD-10-CM | POA: Diagnosis not present

## 2018-10-01 DIAGNOSIS — L819 Disorder of pigmentation, unspecified: Secondary | ICD-10-CM | POA: Diagnosis not present

## 2018-10-01 DIAGNOSIS — L570 Actinic keratosis: Secondary | ICD-10-CM | POA: Diagnosis not present

## 2018-10-01 DIAGNOSIS — L821 Other seborrheic keratosis: Secondary | ICD-10-CM | POA: Diagnosis not present

## 2018-10-04 DIAGNOSIS — M25551 Pain in right hip: Secondary | ICD-10-CM | POA: Diagnosis not present

## 2018-11-14 DIAGNOSIS — F4321 Adjustment disorder with depressed mood: Secondary | ICD-10-CM | POA: Diagnosis not present

## 2018-11-14 DIAGNOSIS — E7800 Pure hypercholesterolemia, unspecified: Secondary | ICD-10-CM | POA: Diagnosis not present

## 2018-11-14 DIAGNOSIS — G4700 Insomnia, unspecified: Secondary | ICD-10-CM | POA: Diagnosis not present

## 2018-11-14 DIAGNOSIS — M545 Low back pain: Secondary | ICD-10-CM | POA: Diagnosis not present

## 2018-11-14 DIAGNOSIS — Z0000 Encounter for general adult medical examination without abnormal findings: Secondary | ICD-10-CM | POA: Diagnosis not present

## 2018-11-15 DIAGNOSIS — M25551 Pain in right hip: Secondary | ICD-10-CM | POA: Diagnosis not present

## 2018-11-15 DIAGNOSIS — M545 Low back pain: Secondary | ICD-10-CM | POA: Diagnosis not present

## 2018-11-19 DIAGNOSIS — E7800 Pure hypercholesterolemia, unspecified: Secondary | ICD-10-CM | POA: Diagnosis not present

## 2018-11-19 DIAGNOSIS — Z23 Encounter for immunization: Secondary | ICD-10-CM | POA: Diagnosis not present

## 2018-11-20 DIAGNOSIS — M5136 Other intervertebral disc degeneration, lumbar region: Secondary | ICD-10-CM | POA: Diagnosis not present

## 2018-11-20 DIAGNOSIS — M48061 Spinal stenosis, lumbar region without neurogenic claudication: Secondary | ICD-10-CM | POA: Diagnosis not present

## 2018-11-20 DIAGNOSIS — M545 Low back pain: Secondary | ICD-10-CM | POA: Diagnosis not present

## 2018-11-20 DIAGNOSIS — G894 Chronic pain syndrome: Secondary | ICD-10-CM | POA: Diagnosis not present

## 2018-11-20 DIAGNOSIS — M5137 Other intervertebral disc degeneration, lumbosacral region: Secondary | ICD-10-CM | POA: Diagnosis not present

## 2018-12-27 DIAGNOSIS — Z5181 Encounter for therapeutic drug level monitoring: Secondary | ICD-10-CM | POA: Diagnosis not present

## 2018-12-27 DIAGNOSIS — M545 Low back pain: Secondary | ICD-10-CM | POA: Diagnosis not present

## 2018-12-27 DIAGNOSIS — M48061 Spinal stenosis, lumbar region without neurogenic claudication: Secondary | ICD-10-CM | POA: Diagnosis not present

## 2018-12-27 DIAGNOSIS — M5136 Other intervertebral disc degeneration, lumbar region: Secondary | ICD-10-CM | POA: Diagnosis not present

## 2018-12-27 DIAGNOSIS — G894 Chronic pain syndrome: Secondary | ICD-10-CM | POA: Diagnosis not present

## 2018-12-27 DIAGNOSIS — G8929 Other chronic pain: Secondary | ICD-10-CM | POA: Diagnosis not present

## 2018-12-27 DIAGNOSIS — Z79899 Other long term (current) drug therapy: Secondary | ICD-10-CM | POA: Diagnosis not present

## 2019-02-14 DIAGNOSIS — G894 Chronic pain syndrome: Secondary | ICD-10-CM | POA: Diagnosis not present

## 2019-02-14 DIAGNOSIS — M545 Low back pain: Secondary | ICD-10-CM | POA: Diagnosis not present

## 2019-02-14 DIAGNOSIS — M4800 Spinal stenosis, site unspecified: Secondary | ICD-10-CM | POA: Diagnosis not present

## 2019-02-14 DIAGNOSIS — M47816 Spondylosis without myelopathy or radiculopathy, lumbar region: Secondary | ICD-10-CM | POA: Diagnosis not present

## 2019-03-04 DIAGNOSIS — M5416 Radiculopathy, lumbar region: Secondary | ICD-10-CM | POA: Diagnosis not present

## 2019-03-04 DIAGNOSIS — M25561 Pain in right knee: Secondary | ICD-10-CM | POA: Diagnosis not present

## 2019-03-04 DIAGNOSIS — G894 Chronic pain syndrome: Secondary | ICD-10-CM | POA: Diagnosis not present

## 2019-03-10 ENCOUNTER — Ambulatory Visit: Payer: Medicare Other

## 2019-03-16 ENCOUNTER — Ambulatory Visit: Payer: Medicare Other | Attending: Internal Medicine

## 2019-03-16 ENCOUNTER — Ambulatory Visit: Payer: Medicare Other

## 2019-03-16 DIAGNOSIS — Z23 Encounter for immunization: Secondary | ICD-10-CM | POA: Insufficient documentation

## 2019-03-16 NOTE — Progress Notes (Signed)
   Covid-19 Vaccination Clinic  Name:  KOLESON ATA    MRN: [id number] DOB: 21-Jun-1931  03/16/2019  Mr. Lindig was observed post Covid-19 immunization for 15 minutes without incidence. He was provided with Vaccine Information Sheet and instruction to access the V-Safe system.   Mr. Schwinghammer was instructed to call 911 with any severe reactions post vaccine: Marland Kitchen Difficulty breathing  . Swelling of your face and throat  . A fast heartbeat  . A bad rash all over your body  . Dizziness and weakness    Immunizations Administered    Name Date Dose VIS Date Route   Pfizer COVID-19 Vaccine 03/16/2019  4:27 PM 0.3 mL 01/17/2019 Intramuscular   Manufacturer: [place]   Lot: [id number]   [place]: [id number]

## 2019-03-25 DIAGNOSIS — M5417 Radiculopathy, lumbosacral region: Secondary | ICD-10-CM | POA: Diagnosis not present

## 2019-04-07 DIAGNOSIS — L570 Actinic keratosis: Secondary | ICD-10-CM | POA: Diagnosis not present

## 2019-04-07 DIAGNOSIS — D485 Neoplasm of uncertain behavior of skin: Secondary | ICD-10-CM | POA: Diagnosis not present

## 2019-04-07 DIAGNOSIS — C4442 Squamous cell carcinoma of skin of scalp and neck: Secondary | ICD-10-CM | POA: Diagnosis not present

## 2019-04-07 DIAGNOSIS — L819 Disorder of pigmentation, unspecified: Secondary | ICD-10-CM | POA: Diagnosis not present

## 2019-04-08 DIAGNOSIS — C44329 Squamous cell carcinoma of skin of other parts of face: Secondary | ICD-10-CM | POA: Diagnosis not present

## 2019-04-08 DIAGNOSIS — L821 Other seborrheic keratosis: Secondary | ICD-10-CM | POA: Diagnosis not present

## 2019-04-08 DIAGNOSIS — M5417 Radiculopathy, lumbosacral region: Secondary | ICD-10-CM | POA: Diagnosis not present

## 2019-04-08 DIAGNOSIS — G894 Chronic pain syndrome: Secondary | ICD-10-CM | POA: Diagnosis not present

## 2019-04-08 DIAGNOSIS — M47816 Spondylosis without myelopathy or radiculopathy, lumbar region: Secondary | ICD-10-CM | POA: Diagnosis not present

## 2019-04-08 DIAGNOSIS — M792 Neuralgia and neuritis, unspecified: Secondary | ICD-10-CM | POA: Diagnosis not present

## 2019-04-09 ENCOUNTER — Ambulatory Visit: Payer: Medicare Other | Attending: Internal Medicine

## 2019-04-09 ENCOUNTER — Ambulatory Visit: Payer: Medicare Other

## 2019-04-09 DIAGNOSIS — Z23 Encounter for immunization: Secondary | ICD-10-CM

## 2019-04-09 NOTE — Progress Notes (Signed)
   Covid-19 Vaccination Clinic  Name:  Michael Ware    MRN: [id number] DOB: [birth year]  04/09/2019  [name] was observed post Covid-19 immunization for 15 minutes without incident. He was provided with Vaccine Information Sheet and instruction to access the V-Safe system.   [name] was instructed to call 911 with any severe reactions post vaccine: Marland Kitchen Difficulty breathing  . Swelling of face and throat  . A fast heartbeat  . A bad rash all over body  . Dizziness and weakness   Immunizations Administered    Name Date Dose VIS Date Route   Pfizer COVID-19 Vaccine 04/09/2019  3:25 PM 0.3 mL 01/17/2019 Intramuscular   Manufacturer: [place]   Lot: [id number]   [place]: [id number]

## 2019-04-29 DIAGNOSIS — G894 Chronic pain syndrome: Secondary | ICD-10-CM | POA: Diagnosis not present

## 2019-04-29 DIAGNOSIS — M25551 Pain in right hip: Secondary | ICD-10-CM | POA: Diagnosis not present

## 2019-05-19 DIAGNOSIS — I10 Essential (primary) hypertension: Secondary | ICD-10-CM | POA: Diagnosis not present

## 2019-05-19 DIAGNOSIS — F329 Major depressive disorder, single episode, unspecified: Secondary | ICD-10-CM | POA: Diagnosis not present

## 2019-05-19 DIAGNOSIS — M47817 Spondylosis without myelopathy or radiculopathy, lumbosacral region: Secondary | ICD-10-CM | POA: Diagnosis not present

## 2019-05-30 DIAGNOSIS — Z043 Encounter for examination and observation following other accident: Secondary | ICD-10-CM | POA: Diagnosis not present

## 2019-05-30 DIAGNOSIS — S299XXA Unspecified injury of thorax, initial encounter: Secondary | ICD-10-CM | POA: Diagnosis not present

## 2019-05-30 DIAGNOSIS — S3993XA Unspecified injury of pelvis, initial encounter: Secondary | ICD-10-CM | POA: Diagnosis not present

## 2019-05-30 DIAGNOSIS — S3992XA Unspecified injury of lower back, initial encounter: Secondary | ICD-10-CM | POA: Diagnosis not present

## 2019-06-02 DIAGNOSIS — F329 Major depressive disorder, single episode, unspecified: Secondary | ICD-10-CM | POA: Diagnosis not present

## 2019-06-02 DIAGNOSIS — M545 Low back pain: Secondary | ICD-10-CM | POA: Diagnosis not present

## 2019-06-02 DIAGNOSIS — I10 Essential (primary) hypertension: Secondary | ICD-10-CM | POA: Diagnosis not present

## 2019-06-02 DIAGNOSIS — M47817 Spondylosis without myelopathy or radiculopathy, lumbosacral region: Secondary | ICD-10-CM | POA: Diagnosis not present

## 2019-06-02 DIAGNOSIS — Z87891 Personal history of nicotine dependence: Secondary | ICD-10-CM | POA: Diagnosis not present

## 2019-06-02 DIAGNOSIS — G894 Chronic pain syndrome: Secondary | ICD-10-CM | POA: Diagnosis not present

## 2019-06-30 DIAGNOSIS — M47816 Spondylosis without myelopathy or radiculopathy, lumbar region: Secondary | ICD-10-CM | POA: Diagnosis not present

## 2019-06-30 DIAGNOSIS — M48061 Spinal stenosis, lumbar region without neurogenic claudication: Secondary | ICD-10-CM | POA: Diagnosis not present

## 2019-06-30 DIAGNOSIS — M792 Neuralgia and neuritis, unspecified: Secondary | ICD-10-CM | POA: Diagnosis not present

## 2019-06-30 DIAGNOSIS — G894 Chronic pain syndrome: Secondary | ICD-10-CM | POA: Diagnosis not present

## 2019-07-21 DIAGNOSIS — M25551 Pain in right hip: Secondary | ICD-10-CM | POA: Diagnosis not present

## 2019-09-11 DIAGNOSIS — M5136 Other intervertebral disc degeneration, lumbar region: Secondary | ICD-10-CM | POA: Diagnosis not present

## 2019-10-22 DIAGNOSIS — M25551 Pain in right hip: Secondary | ICD-10-CM | POA: Diagnosis not present

## 2019-11-03 DIAGNOSIS — Z23 Encounter for immunization: Secondary | ICD-10-CM | POA: Diagnosis not present

## 2019-11-04 ENCOUNTER — Inpatient Hospital Stay (HOSPITAL_COMMUNITY): Payer: Medicare Other

## 2019-11-04 ENCOUNTER — Encounter (HOSPITAL_COMMUNITY): Payer: Self-pay | Admitting: Emergency Medicine

## 2019-11-04 ENCOUNTER — Other Ambulatory Visit: Payer: Self-pay | Admitting: Student

## 2019-11-04 ENCOUNTER — Inpatient Hospital Stay (HOSPITAL_COMMUNITY)
Admission: EM | Admit: 2019-11-04 | Discharge: 2019-11-14 | DRG: 492 | Disposition: A | Discharge status: Skilled Nursing Facility | Payer: Medicare Other | Attending: Internal Medicine | Admitting: Internal Medicine

## 2019-11-04 ENCOUNTER — Emergency Department (HOSPITAL_COMMUNITY): Payer: Medicare Other

## 2019-11-04 ENCOUNTER — Other Ambulatory Visit: Payer: Self-pay

## 2019-11-04 DIAGNOSIS — Y92000 Kitchen of unspecified non-institutional (private) residence as  the place of occurrence of the external cause: Secondary | ICD-10-CM

## 2019-11-04 DIAGNOSIS — M6281 Muscle weakness (generalized): Secondary | ICD-10-CM | POA: Diagnosis not present

## 2019-11-04 DIAGNOSIS — S42309A Unspecified fracture of shaft of humerus, unspecified arm, initial encounter for closed fracture: Secondary | ICD-10-CM | POA: Diagnosis present

## 2019-11-04 DIAGNOSIS — W19XXXA Unspecified fall, initial encounter: Secondary | ICD-10-CM | POA: Diagnosis not present

## 2019-11-04 DIAGNOSIS — W19XXXD Unspecified fall, subsequent encounter: Secondary | ICD-10-CM | POA: Diagnosis not present

## 2019-11-04 DIAGNOSIS — Y9301 Activity, walking, marching and hiking: Secondary | ICD-10-CM | POA: Diagnosis present

## 2019-11-04 DIAGNOSIS — R0902 Hypoxemia: Secondary | ICD-10-CM

## 2019-11-04 DIAGNOSIS — M25511 Pain in right shoulder: Secondary | ICD-10-CM | POA: Diagnosis not present

## 2019-11-04 DIAGNOSIS — I10 Essential (primary) hypertension: Secondary | ICD-10-CM | POA: Diagnosis present

## 2019-11-04 DIAGNOSIS — Z23 Encounter for immunization: Secondary | ICD-10-CM

## 2019-11-04 DIAGNOSIS — Z66 Do not resuscitate: Secondary | ICD-10-CM | POA: Diagnosis not present

## 2019-11-04 DIAGNOSIS — R278 Other lack of coordination: Secondary | ICD-10-CM | POA: Diagnosis not present

## 2019-11-04 DIAGNOSIS — N401 Enlarged prostate with lower urinary tract symptoms: Secondary | ICD-10-CM | POA: Diagnosis present

## 2019-11-04 DIAGNOSIS — M79652 Pain in left thigh: Secondary | ICD-10-CM | POA: Diagnosis not present

## 2019-11-04 DIAGNOSIS — J9601 Acute respiratory failure with hypoxia: Secondary | ICD-10-CM | POA: Diagnosis not present

## 2019-11-04 DIAGNOSIS — M79605 Pain in left leg: Secondary | ICD-10-CM | POA: Diagnosis not present

## 2019-11-04 DIAGNOSIS — M79601 Pain in right arm: Secondary | ICD-10-CM | POA: Diagnosis not present

## 2019-11-04 DIAGNOSIS — D7589 Other specified diseases of blood and blood-forming organs: Secondary | ICD-10-CM | POA: Diagnosis present

## 2019-11-04 DIAGNOSIS — G8918 Other acute postprocedural pain: Secondary | ICD-10-CM | POA: Diagnosis not present

## 2019-11-04 DIAGNOSIS — Z043 Encounter for examination and observation following other accident: Secondary | ICD-10-CM | POA: Diagnosis not present

## 2019-11-04 DIAGNOSIS — N179 Acute kidney failure, unspecified: Secondary | ICD-10-CM | POA: Diagnosis not present

## 2019-11-04 DIAGNOSIS — R2681 Unsteadiness on feet: Secondary | ICD-10-CM | POA: Diagnosis not present

## 2019-11-04 DIAGNOSIS — E785 Hyperlipidemia, unspecified: Secondary | ICD-10-CM | POA: Diagnosis present

## 2019-11-04 DIAGNOSIS — Z4789 Encounter for other orthopedic aftercare: Secondary | ICD-10-CM | POA: Diagnosis not present

## 2019-11-04 DIAGNOSIS — F05 Delirium due to known physiological condition: Secondary | ICD-10-CM | POA: Diagnosis not present

## 2019-11-04 DIAGNOSIS — F32A Depression, unspecified: Secondary | ICD-10-CM | POA: Diagnosis not present

## 2019-11-04 DIAGNOSIS — S42351A Displaced comminuted fracture of shaft of humerus, right arm, initial encounter for closed fracture: Secondary | ICD-10-CM

## 2019-11-04 DIAGNOSIS — R001 Bradycardia, unspecified: Secondary | ICD-10-CM | POA: Diagnosis present

## 2019-11-04 DIAGNOSIS — I959 Hypotension, unspecified: Secondary | ICD-10-CM | POA: Diagnosis present

## 2019-11-04 DIAGNOSIS — S2232XA Fracture of one rib, left side, initial encounter for closed fracture: Secondary | ICD-10-CM | POA: Diagnosis not present

## 2019-11-04 DIAGNOSIS — S42293A Other displaced fracture of upper end of unspecified humerus, initial encounter for closed fracture: Secondary | ICD-10-CM | POA: Diagnosis not present

## 2019-11-04 DIAGNOSIS — M545 Low back pain, unspecified: Secondary | ICD-10-CM | POA: Diagnosis present

## 2019-11-04 DIAGNOSIS — I2699 Other pulmonary embolism without acute cor pulmonale: Secondary | ICD-10-CM | POA: Clinically undetermined

## 2019-11-04 DIAGNOSIS — G8929 Other chronic pain: Secondary | ICD-10-CM | POA: Diagnosis present

## 2019-11-04 DIAGNOSIS — Z8249 Family history of ischemic heart disease and other diseases of the circulatory system: Secondary | ICD-10-CM | POA: Diagnosis not present

## 2019-11-04 DIAGNOSIS — Z20822 Contact with and (suspected) exposure to covid-19: Secondary | ICD-10-CM | POA: Diagnosis not present

## 2019-11-04 DIAGNOSIS — D72828 Other elevated white blood cell count: Secondary | ICD-10-CM | POA: Diagnosis present

## 2019-11-04 DIAGNOSIS — Z87891 Personal history of nicotine dependence: Secondary | ICD-10-CM | POA: Diagnosis not present

## 2019-11-04 DIAGNOSIS — S42291A Other displaced fracture of upper end of right humerus, initial encounter for closed fracture: Secondary | ICD-10-CM | POA: Diagnosis present

## 2019-11-04 DIAGNOSIS — E860 Dehydration: Secondary | ICD-10-CM | POA: Diagnosis not present

## 2019-11-04 DIAGNOSIS — I82452 Acute embolism and thrombosis of left peroneal vein: Secondary | ICD-10-CM | POA: Diagnosis present

## 2019-11-04 DIAGNOSIS — F329 Major depressive disorder, single episode, unspecified: Secondary | ICD-10-CM | POA: Diagnosis present

## 2019-11-04 DIAGNOSIS — J9811 Atelectasis: Secondary | ICD-10-CM | POA: Diagnosis not present

## 2019-11-04 DIAGNOSIS — S42331A Displaced oblique fracture of shaft of humerus, right arm, initial encounter for closed fracture: Principal | ICD-10-CM | POA: Diagnosis present

## 2019-11-04 DIAGNOSIS — E872 Acidosis, unspecified: Secondary | ICD-10-CM

## 2019-11-04 DIAGNOSIS — Z79899 Other long term (current) drug therapy: Secondary | ICD-10-CM

## 2019-11-04 DIAGNOSIS — Z4889 Encounter for other specified surgical aftercare: Secondary | ICD-10-CM | POA: Diagnosis not present

## 2019-11-04 DIAGNOSIS — R55 Syncope and collapse: Secondary | ICD-10-CM | POA: Diagnosis not present

## 2019-11-04 DIAGNOSIS — Z888 Allergy status to other drugs, medicaments and biological substances status: Secondary | ICD-10-CM

## 2019-11-04 DIAGNOSIS — J441 Chronic obstructive pulmonary disease with (acute) exacerbation: Secondary | ICD-10-CM | POA: Diagnosis not present

## 2019-11-04 DIAGNOSIS — E559 Vitamin D deficiency, unspecified: Secondary | ICD-10-CM | POA: Diagnosis present

## 2019-11-04 DIAGNOSIS — W010XXA Fall on same level from slipping, tripping and stumbling without subsequent striking against object, initial encounter: Secondary | ICD-10-CM | POA: Diagnosis present

## 2019-11-04 DIAGNOSIS — M25562 Pain in left knee: Secondary | ICD-10-CM | POA: Diagnosis not present

## 2019-11-04 DIAGNOSIS — E782 Mixed hyperlipidemia: Secondary | ICD-10-CM | POA: Diagnosis not present

## 2019-11-04 DIAGNOSIS — M2550 Pain in unspecified joint: Secondary | ICD-10-CM | POA: Diagnosis not present

## 2019-11-04 DIAGNOSIS — S42301D Unspecified fracture of shaft of humerus, right arm, subsequent encounter for fracture with routine healing: Secondary | ICD-10-CM | POA: Diagnosis not present

## 2019-11-04 DIAGNOSIS — S42301A Unspecified fracture of shaft of humerus, right arm, initial encounter for closed fracture: Secondary | ICD-10-CM | POA: Diagnosis not present

## 2019-11-04 DIAGNOSIS — F29 Unspecified psychosis not due to a substance or known physiological condition: Secondary | ICD-10-CM | POA: Diagnosis not present

## 2019-11-04 DIAGNOSIS — R918 Other nonspecific abnormal finding of lung field: Secondary | ICD-10-CM | POA: Diagnosis not present

## 2019-11-04 DIAGNOSIS — M25552 Pain in left hip: Secondary | ICD-10-CM | POA: Diagnosis not present

## 2019-11-04 DIAGNOSIS — Y92009 Unspecified place in unspecified non-institutional (private) residence as the place of occurrence of the external cause: Secondary | ICD-10-CM

## 2019-11-04 DIAGNOSIS — Z9181 History of falling: Secondary | ICD-10-CM | POA: Diagnosis not present

## 2019-11-04 DIAGNOSIS — R531 Weakness: Secondary | ICD-10-CM | POA: Diagnosis not present

## 2019-11-04 DIAGNOSIS — R52 Pain, unspecified: Secondary | ICD-10-CM

## 2019-11-04 DIAGNOSIS — S42391A Other fracture of shaft of right humerus, initial encounter for closed fracture: Secondary | ICD-10-CM | POA: Diagnosis not present

## 2019-11-04 DIAGNOSIS — R0781 Pleurodynia: Secondary | ICD-10-CM | POA: Clinically undetermined

## 2019-11-04 DIAGNOSIS — Z7401 Bed confinement status: Secondary | ICD-10-CM | POA: Diagnosis not present

## 2019-11-04 DIAGNOSIS — Z419 Encounter for procedure for purposes other than remedying health state, unspecified: Secondary | ICD-10-CM

## 2019-11-04 DIAGNOSIS — E861 Hypovolemia: Secondary | ICD-10-CM | POA: Diagnosis present

## 2019-11-04 LAB — BASIC METABOLIC PANEL
Anion gap: 18 — ABNORMAL HIGH (ref 5–15)
BUN: 22 mg/dL (ref 8–23)
CO2: 21 mmol/L — ABNORMAL LOW (ref 22–32)
Calcium: 9.3 mg/dL (ref 8.9–10.3)
Chloride: 100 mmol/L (ref 98–111)
Creatinine, Ser: 1.38 mg/dL — ABNORMAL HIGH (ref 0.61–1.24)
GFR calc Af Amer: 54 mL/min — ABNORMAL LOW (ref >60)
GFR calc non Af Amer: 46 mL/min — ABNORMAL LOW (ref >60)
Glucose, Bld: 135 mg/dL — ABNORMAL HIGH (ref 70–99)
Potassium: 3.7 mmol/L (ref 3.5–5.1)
Sodium: 139 mmol/L (ref 135–145)

## 2019-11-04 LAB — CBC WITH DIFFERENTIAL/PLATELET
Abs Immature Granulocytes: 0.11 10*3/uL — ABNORMAL HIGH (ref 0.00–0.07)
Basophils Absolute: 0.1 10*3/uL (ref 0.0–0.1)
Basophils Relative: 0 %
Eosinophils Absolute: 0.1 10*3/uL (ref 0.0–0.5)
Eosinophils Relative: 0 %
HCT: 44.9 % (ref 39.0–52.0)
Hemoglobin: 16 g/dL (ref 13.0–17.0)
Immature Granulocytes: 1 %
Lymphocytes Relative: 14 %
Lymphs Abs: 2.7 10*3/uL (ref 0.7–4.0)
MCH: 37 pg — ABNORMAL HIGH (ref 26.0–34.0)
MCHC: 35.6 g/dL (ref 30.0–36.0)
MCV: 103.7 fL — ABNORMAL HIGH (ref 80.0–100.0)
Monocytes Absolute: 1.6 10*3/uL — ABNORMAL HIGH (ref 0.1–1.0)
Monocytes Relative: 9 %
Neutro Abs: 14 10*3/uL — ABNORMAL HIGH (ref 1.7–7.7)
Neutrophils Relative %: 76 %
Platelets: 273 10*3/uL (ref 150–400)
RBC: 4.33 MIL/uL (ref 4.22–5.81)
RDW: 12.1 % (ref 11.5–15.5)
WBC: 18.4 10*3/uL — ABNORMAL HIGH (ref 4.0–10.5)
nRBC: 0 % (ref 0.0–0.2)

## 2019-11-04 LAB — URINALYSIS, ROUTINE W REFLEX MICROSCOPIC
Bilirubin Urine: NEGATIVE
Glucose, UA: NEGATIVE mg/dL
Hgb urine dipstick: NEGATIVE
Ketones, ur: NEGATIVE mg/dL
Leukocytes,Ua: NEGATIVE
Nitrite: NEGATIVE
Protein, ur: NEGATIVE mg/dL
Specific Gravity, Urine: 1.012 (ref 1.005–1.030)
pH: 5 (ref 5.0–8.0)

## 2019-11-04 LAB — RESPIRATORY PANEL BY RT PCR (FLU A&B, COVID)
Influenza A by PCR: NEGATIVE
Influenza B by PCR: NEGATIVE
SARS Coronavirus 2 by RT PCR: NEGATIVE

## 2019-11-04 LAB — ECHOCARDIOGRAM COMPLETE
Area-P 1/2: 1.84 cm2
Height: 68 in
S' Lateral: 2.2 cm
Weight: 3216.95 oz

## 2019-11-04 LAB — LACTIC ACID, PLASMA
Lactic Acid, Venous: 3.2 mmol/L (ref 0.5–1.9)
Lactic Acid, Venous: 1.3 mmol/L (ref 0.5–1.9)

## 2019-11-04 LAB — TSH: TSH: 3.358 u[IU]/mL (ref 0.350–4.500)

## 2019-11-04 LAB — MAGNESIUM: Magnesium: 2.5 mg/dL — ABNORMAL HIGH (ref 1.7–2.4)

## 2019-11-04 IMAGING — DX DG CHEST 1V PORT
1 series · 2 of 2 positions shown · non-contrast
Comparison: None.

CLINICAL DATA: Fall with hypoxia

EXAM:
PORTABLE CHEST 1 VIEW

[Series 1: chest ap · 0.14mm/px · 2 of 2 slices shown]
[im 1/2]
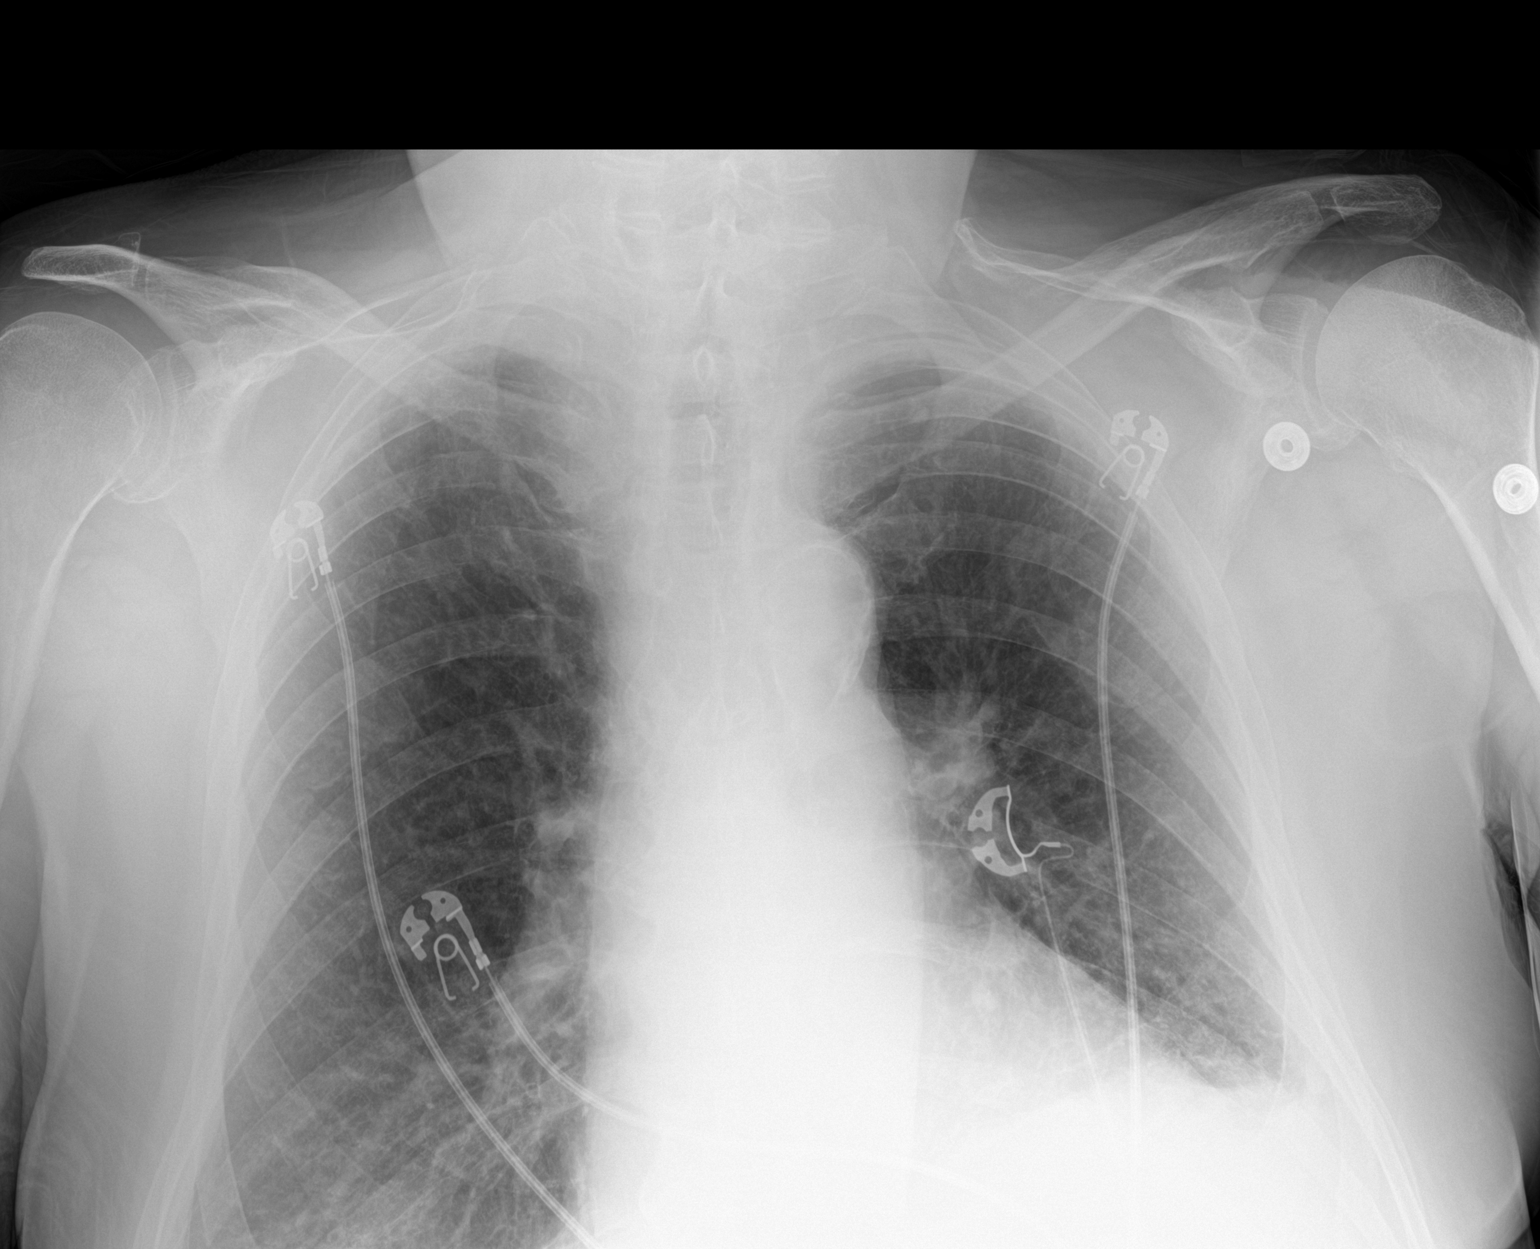
[im 2/2]
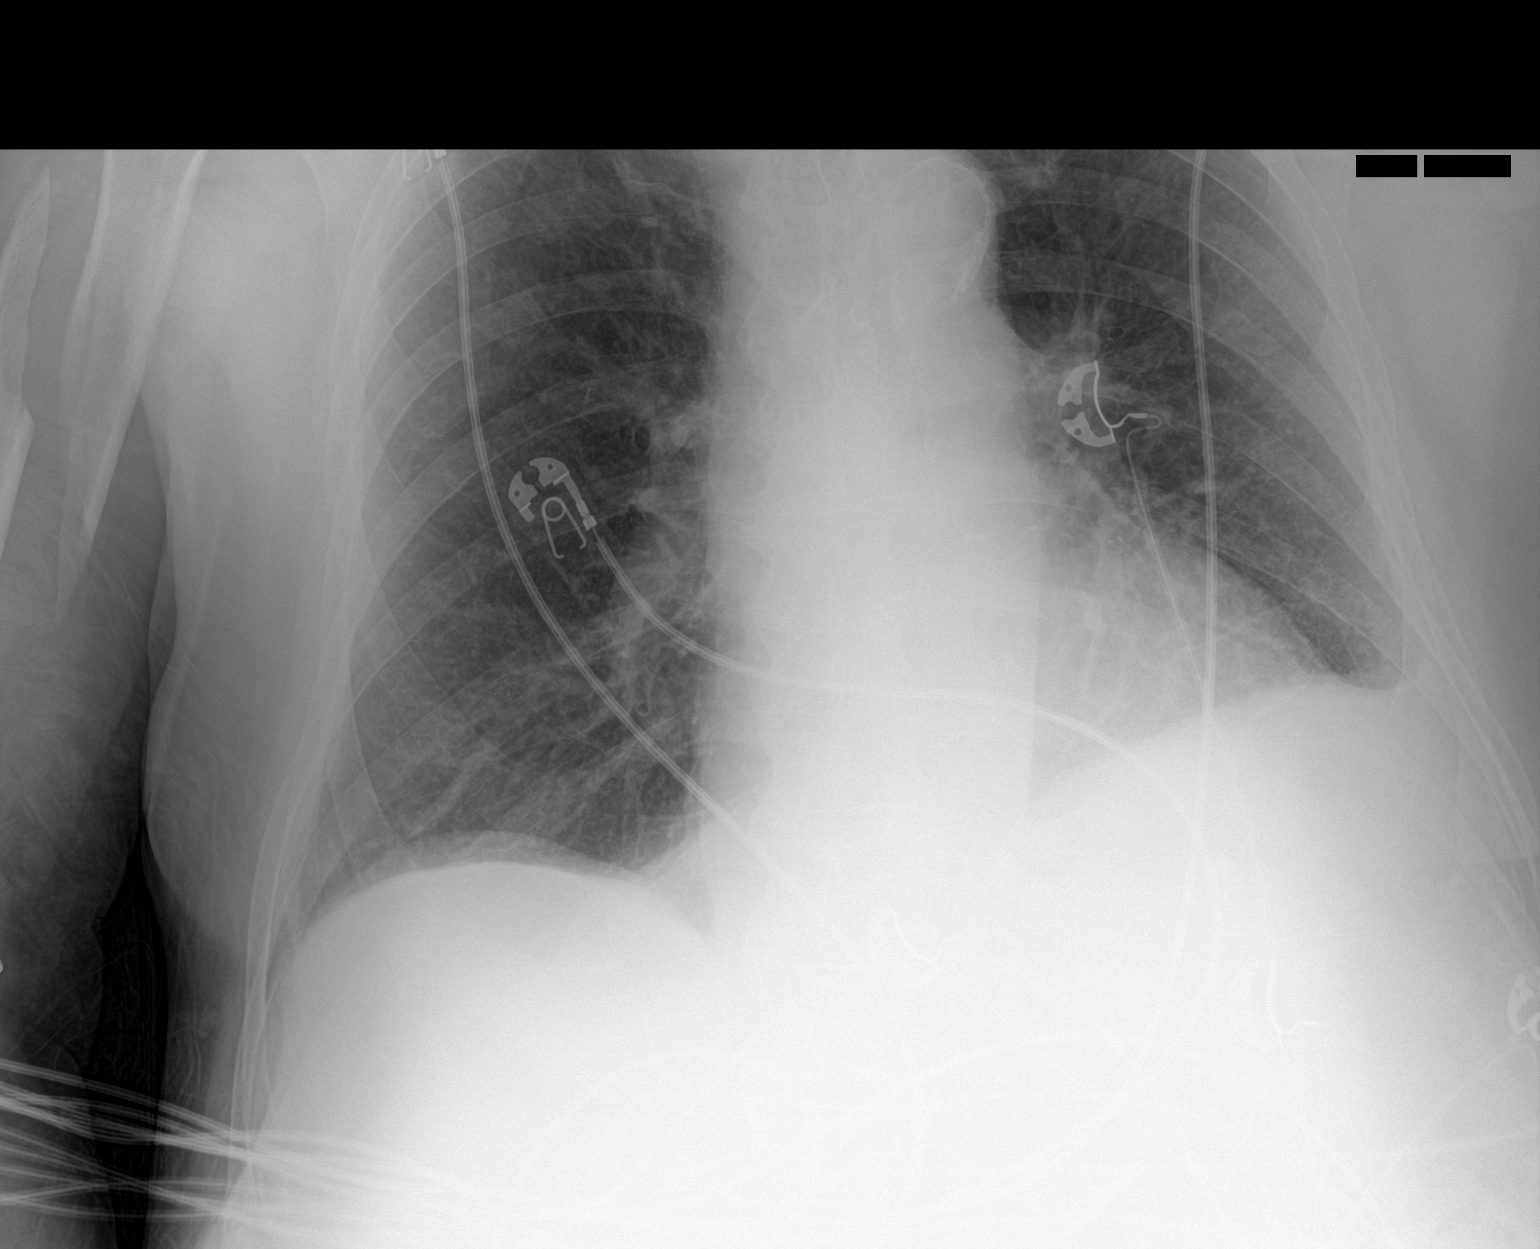

[2 of 2 positions shown; findings below may reference images not displayed]

FINDINGS: The heart size and mediastinal contours are within normal limits.
Both lungs are clear. There is left basilar atelectasis. The
visualized skeletal structures are unremarkable.
IMPRESSION: No active disease.

## 2019-11-04 IMAGING — CR DG KNEE COMPLETE 4+V*L*
4 series · 4 of 4 positions shown · non-contrast
Comparison: None.

CLINICAL DATA: Fall

EXAM:
LEFT KNEE - COMPLETE 4+ VIEW

[x knee obl left (1 of 2)]
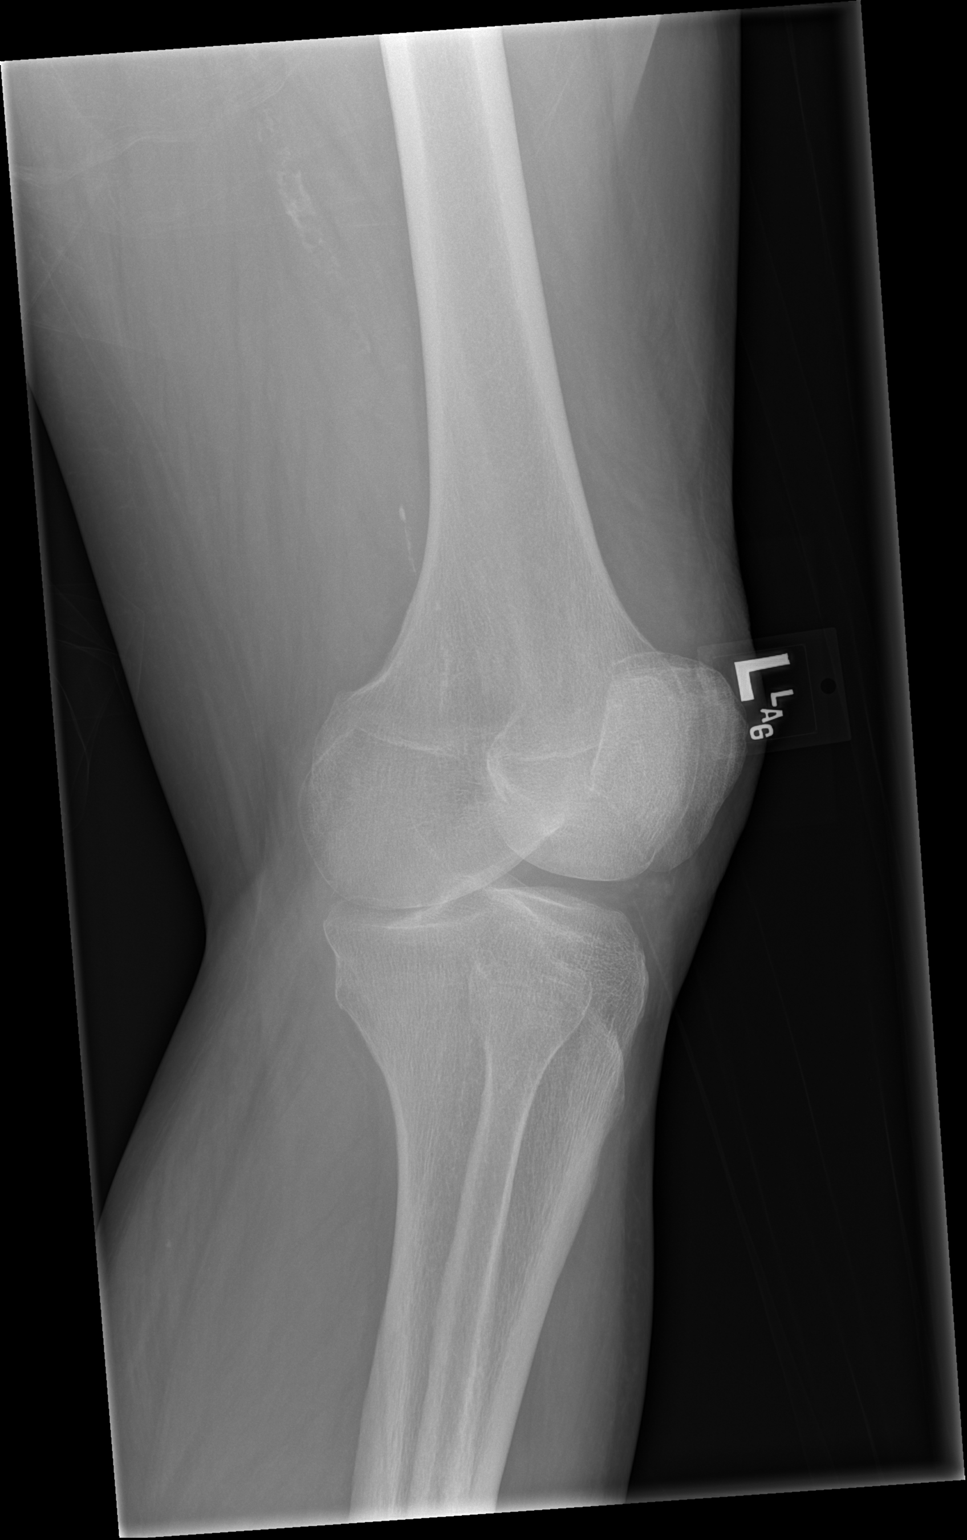

[x knee lat left]
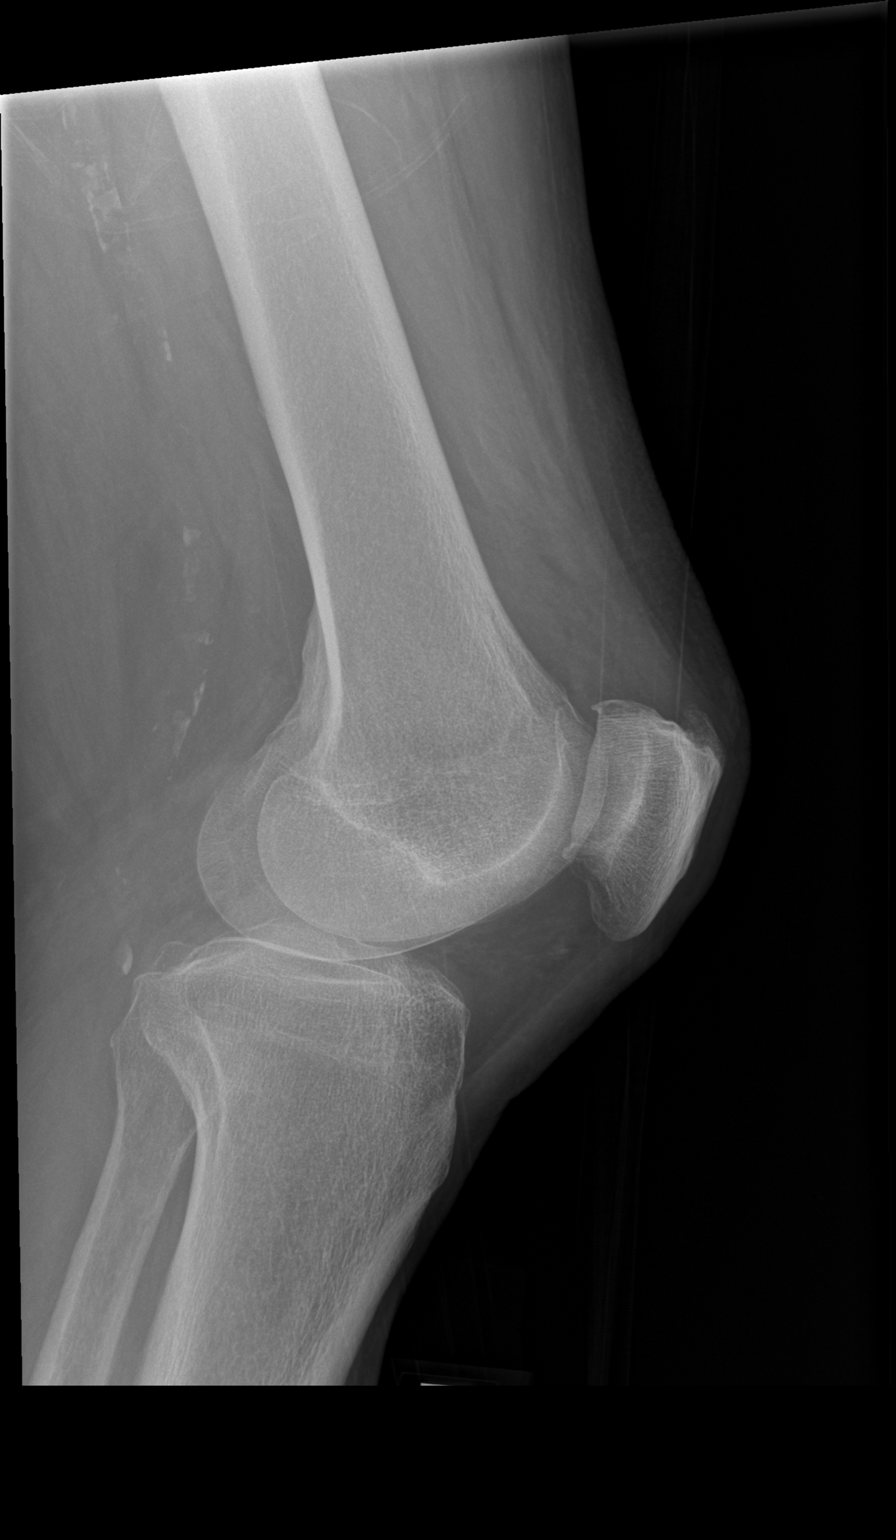

[x knee ap left]
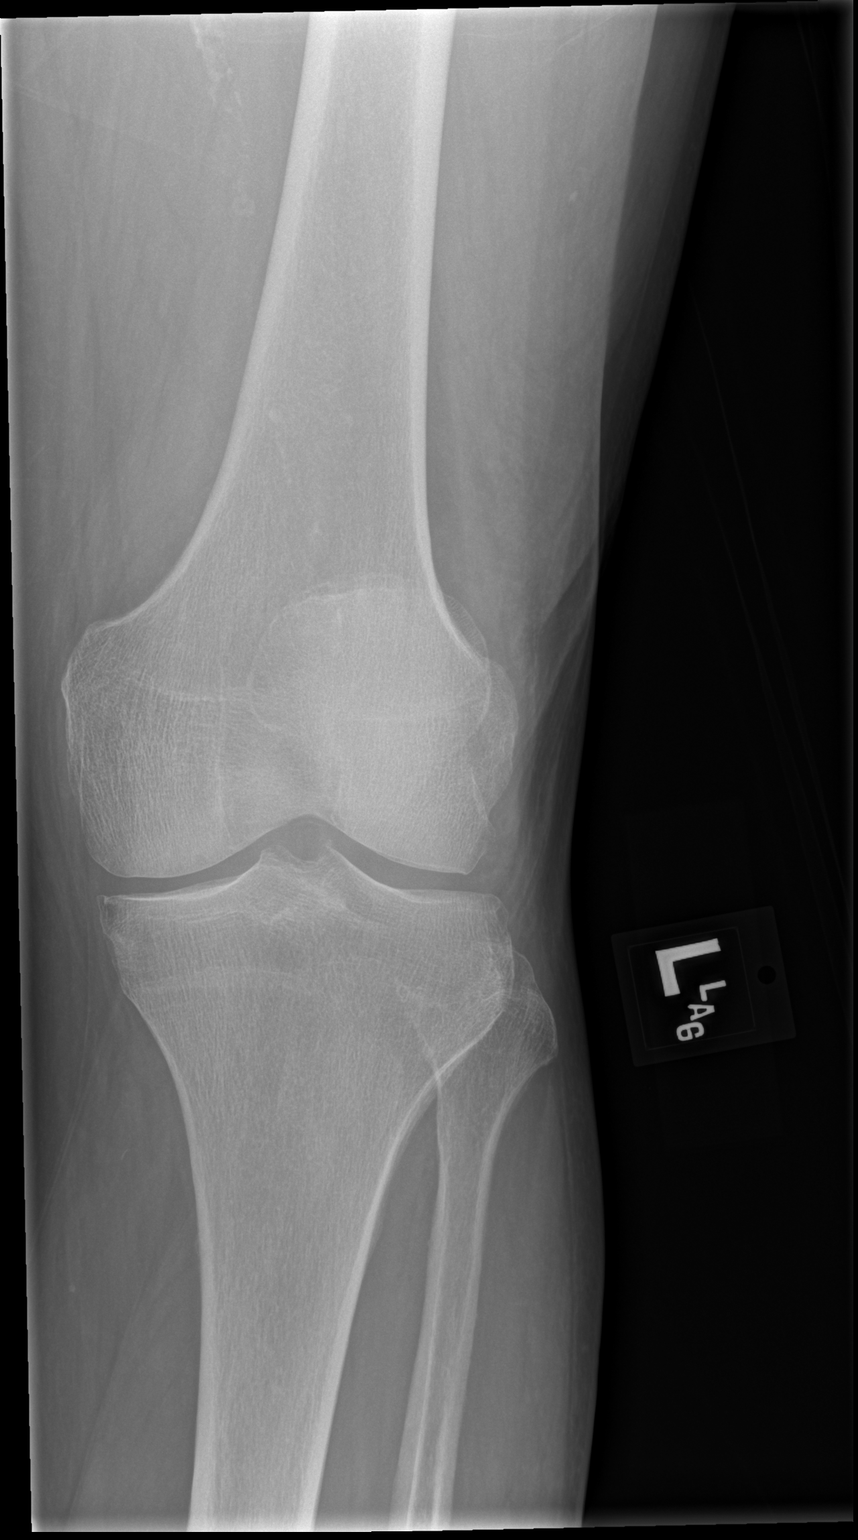

[x knee obl left (2 of 2)]
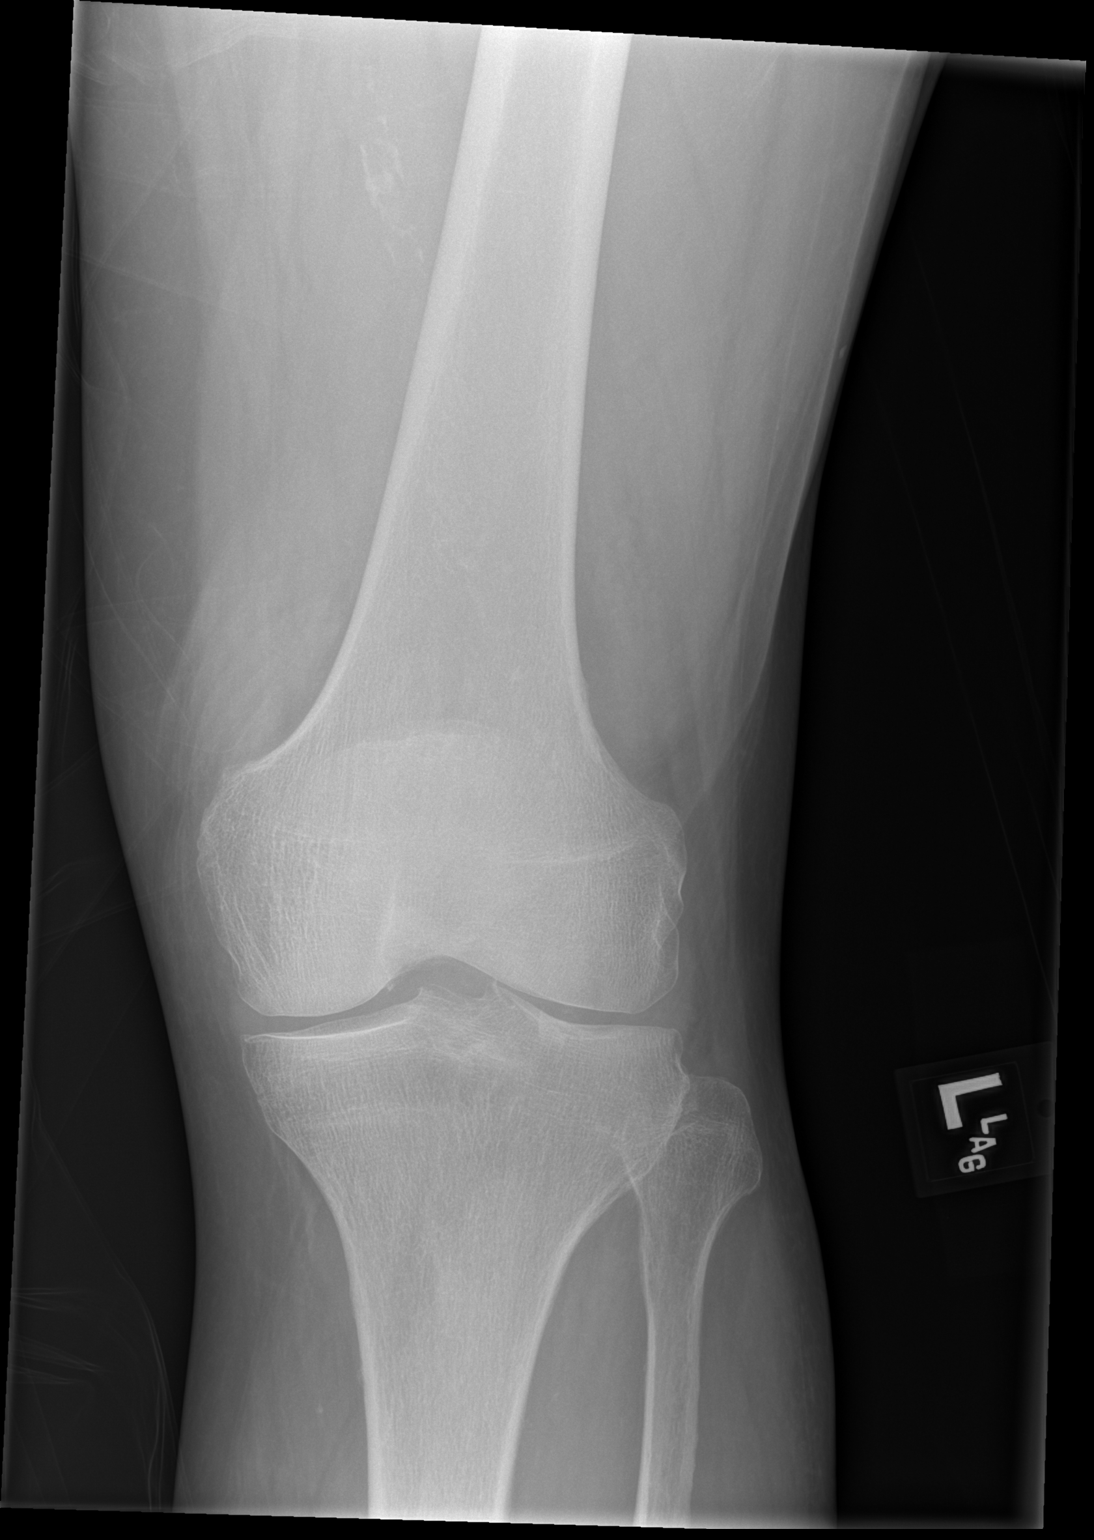

[4 of 4 positions shown; findings below may reference images not displayed]

FINDINGS: No evidence of fracture, dislocation, or joint effusion. No evidence
of arthropathy or other focal bone abnormality. Soft tissues are
unremarkable.
IMPRESSION: Negative.

## 2019-11-04 IMAGING — CR DG HUMERUS 2V *R*
2 series · 2 of 2 positions shown · non-contrast
Comparison: None.

CLINICAL DATA: Fall

EXAM:
RIGHT HUMERUS - 2+ VIEW

[x humerus ap right]
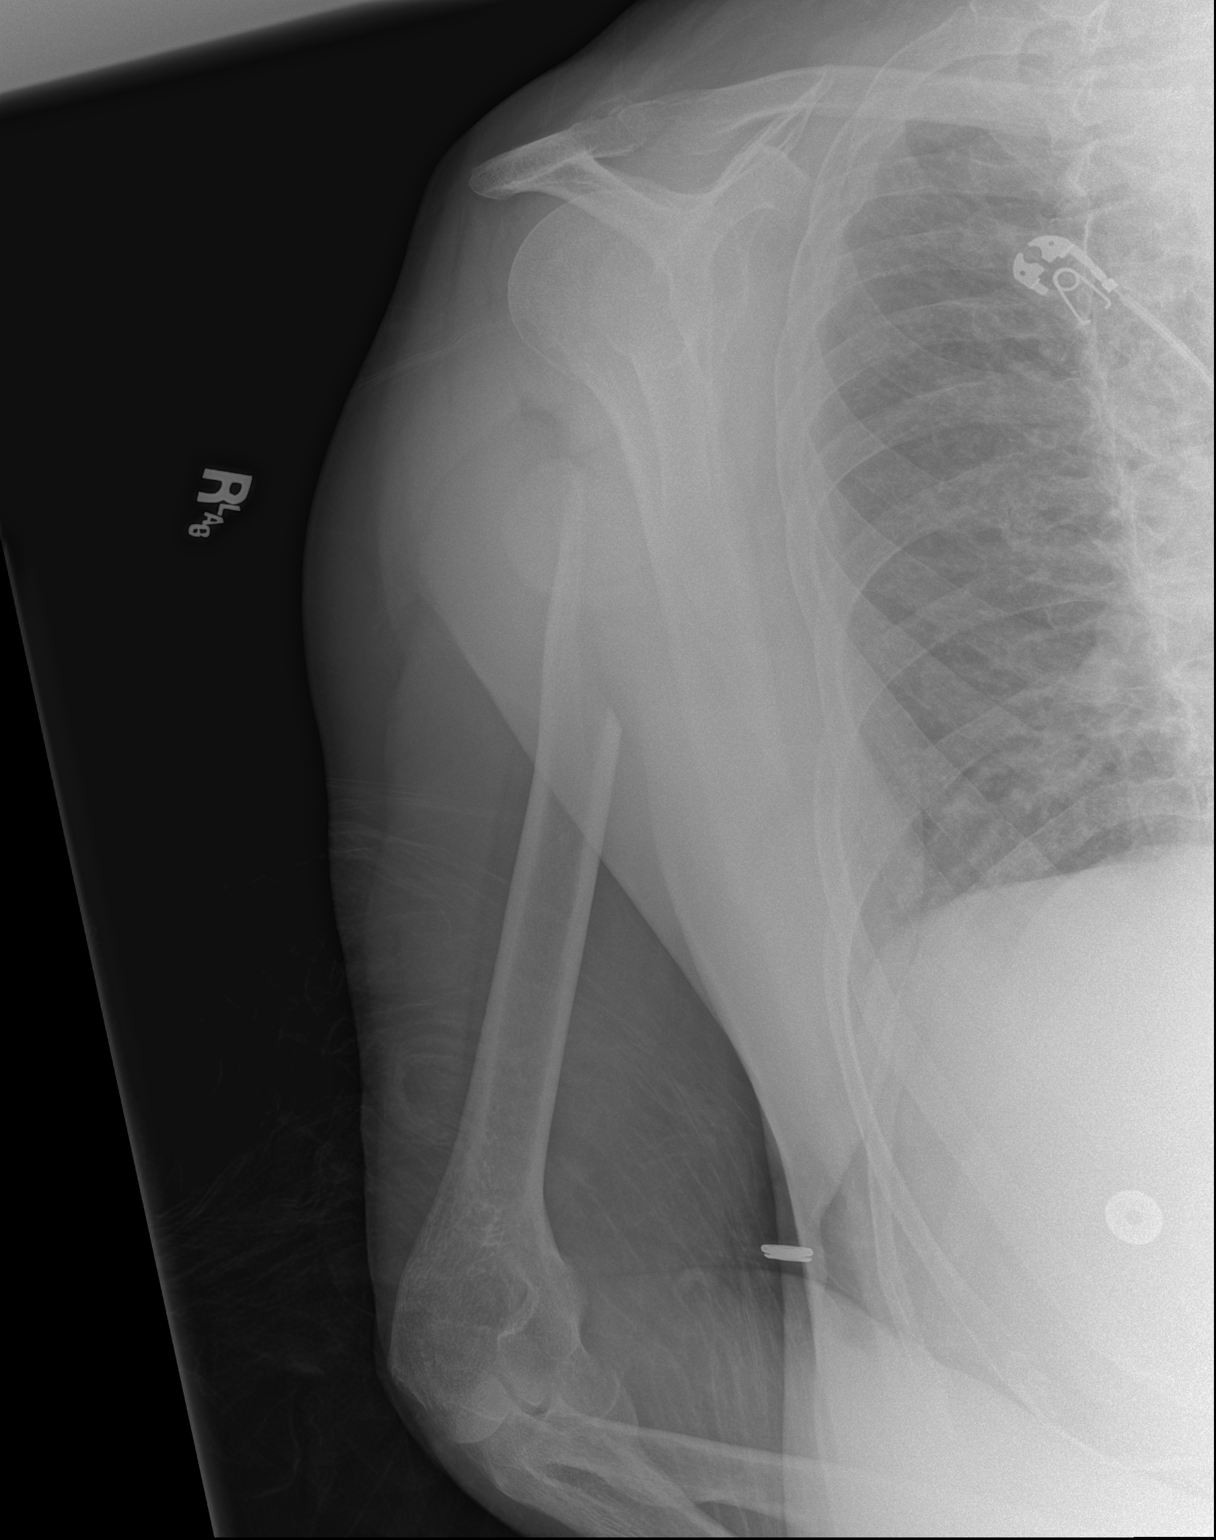

[x humerus lat right]
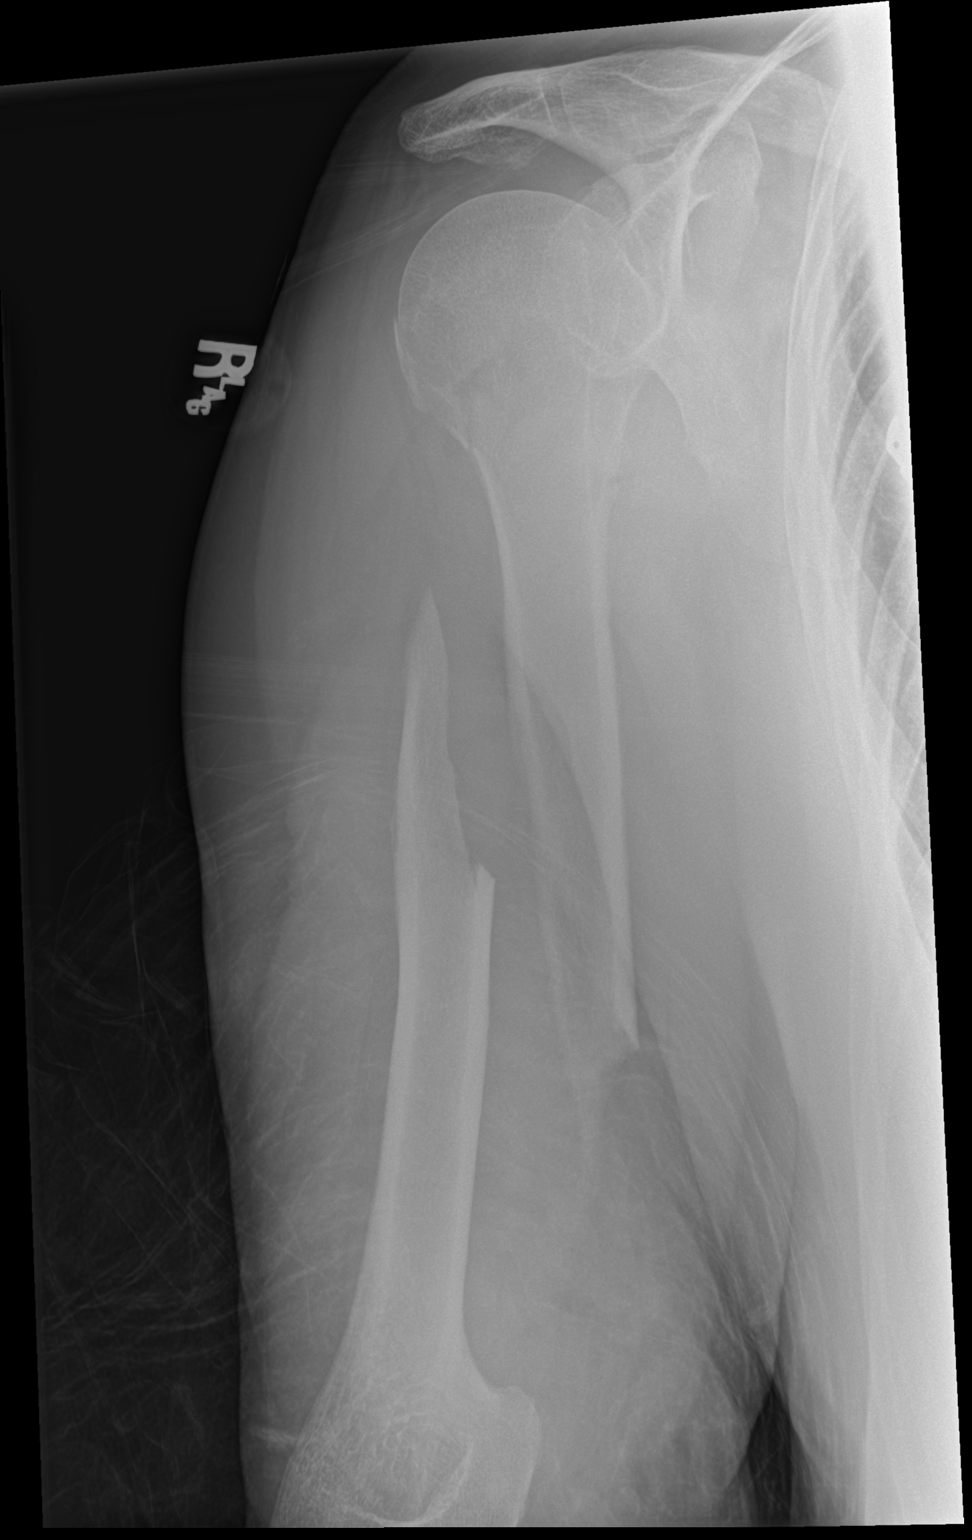

[2 of 2 positions shown; findings below may reference images not displayed]

FINDINGS: There is a laterally and posteriorly displaced oblique fracture of
the midshaft right humerus. There is a comminuted fracture of the
right humeral head with minimal displacement. No visible
dislocation, though positioning is nonstandard.
IMPRESSION: 1. Laterally and posteriorly displaced oblique fracture of the
midshaft right humerus.
2. Comminuted fracture of the right humeral head.

## 2019-11-04 MED ORDER — OXYCODONE HCL 5 MG PO TABS
5.0000 mg | ORAL_TABLET | ORAL | Status: DC | PRN
  Administered 2019-11-04 – 2019-11-05 (×4): 5 mg via ORAL
  Filled 2019-11-04 (×4): qty 1

## 2019-11-04 MED ORDER — ONDANSETRON HCL 4 MG PO TABS: 4.0000 mg | ORAL_TABLET | Freq: Four times a day (QID) | ORAL | Status: DC | PRN

## 2019-11-04 MED ORDER — SODIUM CHLORIDE 0.9 % IV BOLUS
1000.0000 mL | Freq: Once | INTRAVENOUS | Status: AC
  Administered 2019-11-04: 1000 mL via INTRAVENOUS

## 2019-11-04 MED ORDER — FENTANYL CITRATE (PF) 100 MCG/2ML IJ SOLN
25.0000 ug | Freq: Once | INTRAMUSCULAR | Status: AC
  Administered 2019-11-04: 25 ug via INTRAVENOUS
  Filled 2019-11-04: qty 2

## 2019-11-04 MED ORDER — ONDANSETRON HCL 4 MG/2ML IJ SOLN: 4.0000 mg | Freq: Four times a day (QID) | INTRAMUSCULAR | Status: DC | PRN

## 2019-11-04 MED ORDER — SODIUM CHLORIDE 0.9 % IV BOLUS
500.0000 mL | Freq: Once | INTRAVENOUS | Status: AC
  Administered 2019-11-04: 500 mL via INTRAVENOUS

## 2019-11-04 MED ORDER — FENTANYL CITRATE (PF) 100 MCG/2ML IJ SOLN
25.0000 ug | INTRAMUSCULAR | Status: DC | PRN
  Administered 2019-11-04 (×2): 25 ug via INTRAVENOUS
  Filled 2019-11-04 (×2): qty 2

## 2019-11-04 MED ORDER — OXYCODONE-ACETAMINOPHEN 5-325 MG PO TABS
1.0000 | ORAL_TABLET | Freq: Once | ORAL | Status: AC
  Administered 2019-11-04: 1 via ORAL
  Filled 2019-11-04 (×2): qty 1

## 2019-11-04 MED ORDER — DOCUSATE SODIUM 100 MG PO CAPS
100.0000 mg | ORAL_CAPSULE | Freq: Two times a day (BID) | ORAL | Status: DC
  Administered 2019-11-04 – 2019-11-09 (×10): 100 mg via ORAL
  Filled 2019-11-04 (×10): qty 1

## 2019-11-04 MED ORDER — ACETAMINOPHEN 325 MG PO TABS
650.0000 mg | ORAL_TABLET | Freq: Four times a day (QID) | ORAL | Status: DC | PRN
  Administered 2019-11-08 – 2019-11-13 (×2): 650 mg via ORAL
  Filled 2019-11-04 (×2): qty 2

## 2019-11-04 MED ORDER — SODIUM CHLORIDE 0.9 % IV SOLN: INTRAVENOUS | Status: DC

## 2019-11-04 MED ORDER — CALCIUM CARBONATE 1250 (500 CA) MG PO TABS
600.0000 mg | ORAL_TABLET | Freq: Every day | ORAL | Status: DC
  Administered 2019-11-05 – 2019-11-14 (×10): 625 mg via ORAL
  Filled 2019-11-04 (×10): qty 1

## 2019-11-04 MED ORDER — ACETAMINOPHEN 650 MG RE SUPP: 650.0000 mg | Freq: Four times a day (QID) | RECTAL | Status: DC | PRN

## 2019-11-04 MED ORDER — FENTANYL CITRATE (PF) 100 MCG/2ML IJ SOLN
50.0000 ug | Freq: Once | INTRAMUSCULAR | Status: AC
  Administered 2019-11-04: 50 ug via INTRAVENOUS
  Filled 2019-11-04: qty 2

## 2019-11-04 MED ORDER — ZINC SULFATE 220 (50 ZN) MG PO CAPS
220.0000 mg | ORAL_CAPSULE | Freq: Every day | ORAL | Status: DC
  Administered 2019-11-04 – 2019-11-14 (×9): 220 mg via ORAL
  Filled 2019-11-04 (×11): qty 1

## 2019-11-04 MED ORDER — TETANUS-DIPHTH-ACELL PERTUSSIS 5-2.5-18.5 LF-MCG/0.5 IM SUSP
0.5000 mL | Freq: Once | INTRAMUSCULAR | Status: AC
  Administered 2019-11-04: 0.5 mL via INTRAMUSCULAR
  Filled 2019-11-04: qty 0.5

## 2019-11-04 MED ORDER — MAGNESIUM GLUCONATE 500 MG PO TABS
500.0000 mg | ORAL_TABLET | Freq: Every day | ORAL | Status: DC
  Administered 2019-11-04 – 2019-11-14 (×9): 500 mg via ORAL
  Filled 2019-11-04 (×11): qty 1

## 2019-11-04 MED ORDER — PRAVASTATIN SODIUM 40 MG PO TABS
40.0000 mg | ORAL_TABLET | Freq: Every day | ORAL | Status: DC
  Administered 2019-11-04 – 2019-11-14 (×11): 40 mg via ORAL
  Filled 2019-11-04 (×11): qty 1

## 2019-11-04 MED ORDER — HYDROMORPHONE HCL 1 MG/ML IJ SOLN
0.5000 mg | Freq: Once | INTRAMUSCULAR | Status: AC
  Administered 2019-11-04: 0.5 mg via INTRAVENOUS
  Filled 2019-11-04: qty 1

## 2019-11-04 MED ORDER — VITAMIN D 25 MCG (1000 UNIT) PO TABS
5000.0000 [IU] | ORAL_TABLET | Freq: Every day | ORAL | Status: DC
  Administered 2019-11-04 – 2019-11-14 (×10): 5000 [IU] via ORAL
  Filled 2019-11-04 (×12): qty 5

## 2019-11-04 NOTE — H&P (Signed)
History and Physical    Michael Ware YCX:[id number] DOB: Aug 07, 1931 DOA: 11/04/2019  PCP: Alroy Dust, L.Marlou Sa, MD  Patient coming from: Home  Chief Complaint: Right arm pain after fall.  HPI: Michael Ware is a 86 y.o. male with medical history significant of bradycardia. Presents with right arm pain after fall last night. He states that he was getting up from a chair in his kitchen and walked across the room. Along the way, he bend over to pick up a piece of trash. That is when he lost his balance and stumbled forward. He fell against his oven. He remembers the entire fall. He denies any head injury or LOC. He called his neighbor and EMS was summoned. EMS reported to EDP that pt had a near syncopal episode when they helped him off the floor.  ED Course: In the ED, imaging revealed a right humerus fracture. EKG revealed bradycardia. He was also found to be hypotensive. He was give 1.5L fluid bolus. Orthopedics and cardiology were consulted. [facility] was called for admission.   Review of Systems:  He denies CP, palpitations, dyspnea, HA, F/N/V/D, urinary symptoms, respiratory symptoms, LOC, seizure-like activity. Review of systems is otherwise negative for all not mentioned in HPI.   Past Medical History:  Diagnosis Date  . Acute GI bleeding 01/19/13   most likely due to diverticulosis  . BPH (benign prostatic hypertrophy) with urinary obstruction   . Bradycardia   . Diverticulosis   . Elevated hemoglobin A1c   . Hyperglycemia   . Hyperlipidemia   . Hypertension   . Internal hemorrhoids with bleeding and fecal soiling 11/19/2014  . Rectal bleed 01/19/13  . Vitamin D deficiency     Past Surgical History:  Procedure Laterality Date  . ANAL FISSURE REPAIR  1980  . HEMORRHOID BANDING    . rectal fissure procedure  1970's  . ROTATOR CUFF REPAIR     bilateral  . TONSILLECTOMY       reports that he quit smoking about 21 years ago. He has quit using smokeless tobacco. He reports current  alcohol use of about 2.0 - 3.0 standard drinks of alcohol per week. He reports that he does not use drugs.  Allergies  Allergen Reactions  . Atenolol Other (See Comments)    Caused heart rate to drop Other reaction(s): Other Caused heart rate to drop  . Other     Other reaction(s): Other Dry mouth  . Trazamine [Trazodone & Diet Manage Prod] Other (See Comments)    Dry mouth    Family History  Problem Relation Age of Onset  . Colon cancer Neg Hx   . Heart disease Mother   . Heart disease Father     Prior to Admission medications   Medication Sig Start Date End Date Taking? Authorizing Provider  B Complex-C (B-COMPLEX WITH VITAMIN C) tablet Take 1 tablet by mouth daily.    Yes [provider]  calcium carbonate (OS-CAL) 600 MG TABS tablet Take 600 mg by mouth daily.    Yes [provider]  Cholecalciferol (VITAMIN D3) 5000 UNITS CAPS Take 5,000 Units by mouth daily.    Yes [provider]  CRANBERRY EXTRACT PO Take 1 capsule by mouth daily.    Yes [provider]  Cyanocobalamin (VITAMIN B-12 CR PO) Take 1 tablet by mouth daily.   Yes [provider]  diazepam (VALIUM) 10 MG tablet Take 10 mg by mouth 3 (three) times daily as needed (muscle spasms).  Yes [provider]  FLUoxetine (PROZAC) 10 MG capsule Take 10 mg by mouth daily. 10/23/19  Yes [provider]  Garlic 10 MG CAPS Take 10 mg by mouth daily.    Yes [provider]  magnesium gluconate (MAGONATE) 500 MG tablet Take 500 mg by mouth daily.    Yes [provider]  naproxen sodium (ALEVE) 220 MG tablet Take 440 mg by mouth daily as needed (back pain).   Yes [provider]  pravastatin (PRAVACHOL) 40 MG tablet Take 40 mg by mouth daily.    Yes [provider]  Saw Palmetto, Serenoa repens, (SAW PALMETTO PO) Take 1 capsule by mouth daily.    Yes [provider]  Selenium (SELENIMIN PO) Take 1 capsule by mouth daily.     Yes [provider]  Zinc 50 MG CAPS Take 50 mg by mouth daily.    Yes [provider]  HYDROcodone-acetaminophen (NORCO/VICODIN) 5-325 MG tablet Take 1 tablet by mouth every 6 (six) hours as needed for moderate pain. Patient not taking: Reported on 11/04/2019 05/27/17   Domenic Moras, PA-C  methocarbamol (ROBAXIN) 750 MG tablet Take 1 tablet (750 mg total) by mouth every 8 (eight) hours as needed for muscle spasms. Patient not taking: Reported on 11/04/2019 05/27/17   Domenic Moras, PA-C  naproxen (NAPROSYN) 500 MG tablet Take 1 tablet (500 mg total) by mouth 2 (two) times daily with a meal. Patient not taking: Reported on 05/27/2017 07/04/15   Leo Grosser, MD    Physical Exam: Vitals:   11/04/19 0245 11/04/19 0300 11/04/19 0430 11/04/19 0545  BP: (!) 106/48 (!) 104/50 103/79 (!) 167/52  Pulse: (!) 50 (!) 54 (!) 53 65  Resp: 15 12 (!) 21 20  Temp:      TempSrc:      SpO2: 90% 91% 94% 95%  Weight:      Height:        General: 86 y.o. male resting in bed in NAD Eyes: PERRL, normal sclera ENMT: Nares patent w/o discharge, orophaynx clear, dentition normal, ears w/o discharge/lesions/ulcers Neck: Supple, trachea midline Cardiovascular: brady regular, +S1, S2, no g/r, 2/6 SEM, equal pulses throughout Respiratory: CTABL, no w/r/r, normal WOB GI: BS+, NDNT, no masses noted, no organomegaly noted MSK: No e/c/c; right arm in sling Skin: No rashes, bruises, ulcerations noted Neuro: A&O x 3, no focal deficits Psyc: Appropriate interaction and affect, calm/cooperative  Labs on Admission: I have personally reviewed following labs and imaging studies  CBC: Recent Labs  Lab 11/04/19 0113  WBC 18.4*  NEUTROABS 14.0*  HGB 16.0  HCT 44.9  MCV 103.7*  PLT 563   Basic Metabolic Panel: Recent Labs  Lab 11/04/19 0113  NA 139  K 3.7  CL 100  CO2 21*  GLUCOSE 135*  BUN 22  CREATININE 1.38*  CALCIUM 9.3   GFR: Estimated Creatinine Clearance: 42.9 mL/min (A) (by C-G  formula based on SCr of 1.38 mg/dL (H)). Liver Function Tests: No results for input(s): AST, ALT, ALKPHOS, BILITOT, PROT, ALBUMIN in the last 168 hours. No results for input(s): LIPASE, AMYLASE in the last 168 hours. No results for input(s): AMMONIA in the last 168 hours. Coagulation Profile: No results for input(s): INR, PROTIME in the last 168 hours. Cardiac Enzymes: No results for input(s): CKTOTAL, CKMB, CKMBINDEX, TROPONINI in the last 168 hours. BNP (last 3 results) No results for input(s): PROBNP in the last 8760 hours. HbA1C: No results for input(s): HGBA1C in the last 72  hours. CBG: No results for input(s): GLUCAP in the last 168 hours. Lipid Profile: No results for input(s): CHOL, HDL, LDLCALC, TRIG, CHOLHDL, LDLDIRECT in the last 72 hours. Thyroid Function Tests: No results for input(s): TSH, T4TOTAL, FREET4, T3FREE, THYROIDAB in the last 72 hours. Anemia Panel: No results for input(s): VITAMINB12, FOLATE, FERRITIN, TIBC, IRON, RETICCTPCT in the last 72 hours. Urine analysis:    Component Value Date/Time   COLORURINE YELLOW 11/04/2019 0402   APPEARANCEUR HAZY (A) 11/04/2019 0402   LABSPEC 1.012 11/04/2019 0402   PHURINE 5.0 11/04/2019 0402   GLUCOSEU NEGATIVE 11/04/2019 0402   HGBUR NEGATIVE 11/04/2019 0402   BILIRUBINUR NEGATIVE 11/04/2019 0402   KETONESUR NEGATIVE 11/04/2019 0402   PROTEINUR NEGATIVE 11/04/2019 0402   NITRITE NEGATIVE 11/04/2019 0402   LEUKOCYTESUR NEGATIVE 11/04/2019 0402    Radiological Exams on Admission: DG Chest Portable 1 View  Result Date: 11/04/2019 CLINICAL DATA:  Fall with hypoxia EXAM: PORTABLE CHEST 1 VIEW COMPARISON:  None. FINDINGS: The heart size and mediastinal contours are within normal limits. Both lungs are clear. There is left basilar atelectasis. The visualized skeletal structures are unremarkable. IMPRESSION: No active disease. Electronically Signed   By: Ulyses Jarred M.D.   On: 11/04/2019 02:52   DG Knee Complete 4 Views  Left  Result Date: 11/04/2019 CLINICAL DATA:  Fall EXAM: LEFT KNEE - COMPLETE 4+ VIEW COMPARISON:  None. FINDINGS: No evidence of fracture, dislocation, or joint effusion. No evidence of arthropathy or other focal bone abnormality. Soft tissues are unremarkable. IMPRESSION: Negative. Electronically Signed   By: Ulyses Jarred M.D.   On: 11/04/2019 02:29   DG Humerus Right  Result Date: 11/04/2019 CLINICAL DATA:  Fall EXAM: RIGHT HUMERUS - 2+ VIEW COMPARISON:  None. FINDINGS: There is a laterally and posteriorly displaced oblique fracture of the midshaft right humerus. There is a comminuted fracture of the right humeral head with minimal displacement. No visible dislocation, though positioning is nonstandard. IMPRESSION: 1. Laterally and posteriorly displaced oblique fracture of the midshaft right humerus. 2. Comminuted fracture of the right humeral head. Electronically Signed   By: Ulyses Jarred M.D.   On: 11/04/2019 02:26    EKG: Independently reviewed. Sinus brady  Assessment/Plan Fall Pre-syncope     - admit to inpt, telemetry     - PT/OT eval     - orthostatics     - echo pending  Right humerus fracture     - ortho to review, appreciate assistance     - arm currently in sling  Symptomatic bradycardia     - cardiology to review, appreciate assistance     - echo pending  Hypotension     - fluids     - improved about 1500cc NS bolus     - continue NS at 100cc/hr     - careful use of narcotics for pain control  Lactic acidosis HAGMA     - fluids, rpt lactic acid this morning  AKI     - baseline Scr is ~0.8     - fluids, follow  Leukocytosis     - doesn't meet SIRS, but he was hypotensive at admission and has lactic acidosis     - UA and CXR are negative; no fevers     - this could be reactive     - right now, hold abx, should he develop fevers or other symptoms, can treat broad spec and launch w/u  Macrocytosis     - check B12, folate  Depression     - continue  prozac  HLD     - continue pravastatin  DVT prophylaxis: Lovenox  Code Status: DNR  Family Communication: None at bedside  Consults called: EDP called Orthopedics and Cardiology  Admission status: Inpatient d/t ongoing diagnostic w/u not appropriate for outpt and severity of illness.   Status is: Inpatient  Remains inpatient appropriate because:Inpatient level of care appropriate due to severity of illness   Dispo: The patient is from: Home              Anticipated d/c is to: Home              Anticipated d/c date is: 3 days              Patient currently is not medically stable to d/c.   Michael Finner DO [facility]  If 7PM-7AM, please contact night-coverage [email or web address]  11/04/2019, 8:56 AM

## 2019-11-04 NOTE — Progress Notes (Signed)
Ordered Event Monitor for further evaluation of bradycardia. Please see consult note from today for more details.  Darreld Mclean, PA-C 11/04/2019 8:36 PM

## 2019-11-04 NOTE — ED Notes (Signed)
Patient transported to X-ray 

## 2019-11-04 NOTE — ED Provider Notes (Signed)
[facility] DEPT Provider Note   CSN: [id number] Arrival date & time: 11/04/19  0020     History Chief Complaint  Patient presents with  . Near Syncope  . Fall    Michael Ware is a 86 y.o. male.  HPI     This is an 86 year old male with a history of bradycardia, hyperlipidemia, hypertension who presents following a fall.  Patient reports that he tripped and fell over his feet.  He denies hitting his head or loss of consciousness.  He states that he has not on any blood thinners.  He does take a baby aspirin.  He reports pain in the right arm.  Rates his pain at 6 out of 10.  He states "it is just flopping around."  Per EMS, he had a near syncopal event upon standing after the fall.  Patient denies chest pain, shortness of breath, recent fevers, cough, illnesses.  He lives alone and called his neighbor.  He did scrape his left knee.  Patient noted to be extremely bradycardic.  Has a history of the same.  At that time had been on atenolol and bradycardia improved after discontinuing this medication.  However, there are multiple EKGs in his chart with rates 40-60.  Past Medical History:  Diagnosis Date  . Acute GI bleeding 01/19/13   most likely due to diverticulosis  . BPH (benign prostatic hypertrophy) with urinary obstruction   . Bradycardia   . Diverticulosis   . Elevated hemoglobin A1c   . Hyperglycemia   . Hyperlipidemia   . Hypertension   . Internal hemorrhoids with bleeding and fecal soiling 11/19/2014  . Rectal bleed 01/19/13  . Vitamin D deficiency     Patient Active Problem List   Diagnosis Date Noted  . Internal hemorrhoids with bleeding and fecal soiling 11/19/2014  . Internal hemorrhoids 10/22/2014  . Medication management 12/30/2013  . Sinus bradycardia 02/16/2013  . Rectal bleeding 01/19/2013  . Routine general medical examination at a health care facility 12/24/2012  . Essential hypertension 12/24/2012  . Mixed  hyperlipidemia 12/24/2012  . Abnormal glucose 12/24/2012  . ADD (attention deficit disorder)   . Vitamin D deficiency   . Elevated hemoglobin A1c   . BPH (benign prostatic hypertrophy) with urinary obstruction   . Diverticulosis of colon 01/23/2011    Past Surgical History:  Procedure Laterality Date  . ANAL FISSURE REPAIR  1980  . HEMORRHOID BANDING    . rectal fissure procedure  1970's  . ROTATOR CUFF REPAIR     bilateral  . TONSILLECTOMY         Family History  Problem Relation Age of Onset  . Colon cancer Neg Hx   . Heart disease Mother   . Heart disease Father     Social History   Tobacco Use  . Smoking status: Former Smoker    Quit date: 02/06/1998    Years since quitting: 21.7  . Smokeless tobacco: Former Network engineer Use Topics  . Alcohol use: Yes    Alcohol/week: 2.0 - 3.0 standard drinks    Types: 2 - 3 drink(s) per week    Comment: couple drinks weekly  . Drug use: No    Home Medications Prior to Admission medications   Medication Sig Start Date End Date Taking? Authorizing Provider  diazepam (VALIUM) 10 MG tablet Take 10 mg by mouth 3 (three) times daily as needed (muscle spasms).    Yes [provider]  pravastatin (PRAVACHOL) 40  MG tablet Take 40 mg by mouth daily.    Yes [provider]  aspirin EC 81 MG tablet Take 81 mg by mouth daily.    [provider]  B Complex-C (B-COMPLEX WITH VITAMIN C) tablet Take 1 tablet by mouth daily.     [provider]  calcium carbonate (OS-CAL) 600 MG TABS tablet Take 600 mg by mouth daily.     [provider]  Cholecalciferol (VITAMIN D3) 5000 UNITS CAPS Take 5,000 Units by mouth daily.     [provider]  CRANBERRY EXTRACT PO Take 1 capsule by mouth daily.     [provider]  Cyanocobalamin (VITAMIN B-12 CR PO) Take 1 tablet by mouth daily.    [provider]  FLUoxetine (PROZAC) 10 MG capsule Take 10 mg by mouth daily. 10/23/19    [provider]  Garlic 10 MG CAPS Take 10 mg by mouth daily.     [provider]  HYDROcodone-acetaminophen (NORCO/VICODIN) 5-325 MG tablet Take 1 tablet by mouth every 6 (six) hours as needed for moderate pain. 05/27/17   Domenic Moras, PA-C  hydrocortisone (ANUSOL-HC) 25 MG suppository Place 1 suppository (25 mg total) rectally 2 (two) times daily. Patient not taking: Reported on 05/27/2017 10/05/14   Hvozdovic, Lori P, PA-C  magnesium gluconate (MAGONATE) 500 MG tablet Take 500 mg by mouth daily.     [provider]  Menthol-Zinc Oxide (CALMOSEPTINE) 0.44-20.6 % OINT Apply to external perirectal area 2-3 x daily x 2 weeks Patient not taking: Reported on 05/27/2017 10/05/14   Hvozdovic, Lori P, PA-C  methocarbamol (ROBAXIN) 750 MG tablet Take 1 tablet (750 mg total) by mouth every 8 (eight) hours as needed for muscle spasms. 05/27/17   Domenic Moras, PA-C  naproxen (NAPROSYN) 500 MG tablet Take 1 tablet (500 mg total) by mouth 2 (two) times daily with a meal. Patient not taking: Reported on 05/27/2017 07/04/15   Leo Grosser, MD  naproxen sodium (ALEVE) 220 MG tablet Take 440 mg by mouth daily as needed (back pain).    [provider]  Saw Palmetto, Serenoa repens, (SAW PALMETTO PO) Take 1 capsule by mouth daily.     [provider]  Selenium (SELENIMIN PO) Take 1 capsule by mouth daily.     [provider]  Zinc 50 MG CAPS Take 50 mg by mouth daily.     [provider]    Allergies    Atenolol, Other, and Trazamine [trazodone & diet manage prod]  Review of Systems   Review of Systems  Constitutional: Negative for fever.  Respiratory: Negative for shortness of breath.   Cardiovascular: Negative for chest pain.  Gastrointestinal: Negative for abdominal pain, nausea and vomiting.  Musculoskeletal:       Right arm pain  Skin: Positive for wound.  Neurological: Positive for syncope. Negative for dizziness, light-headedness and headaches.   All other systems reviewed and are negative.   Physical Exam Updated Vital Signs BP 103/79   Pulse (!) 53   Temp 98.9 F (37.2 C) (Oral)   Resp (!) 21   Ht 1.727 m (5\' 8" )   Wt 91.2 kg   SpO2 94%   BMI 30.57 kg/m   Physical Exam Vitals and nursing note reviewed.  Constitutional:      Appearance: He is well-developed. He is not ill-appearing.  HENT:     Head: Normocephalic and atraumatic.     Mouth/Throat:     Mouth: Mucous membranes are moist.  Eyes:     Pupils: Pupils are equal, round, and reactive to light.  Cardiovascular:     Rate and Rhythm: Regular rhythm. Bradycardia present.     Pulses: Normal pulses.     Heart sounds: Normal heart sounds. No murmur heard.   Pulmonary:     Effort: Pulmonary effort is normal. No respiratory distress.     Breath sounds: Normal breath sounds. No wheezing.  Abdominal:     General: Bowel sounds are normal.     Palpations: Abdomen is soft.     Tenderness: There is no abdominal tenderness. There is no rebound.  Musculoskeletal:     Cervical back: Neck supple.     Comments: Tenderness to palpation and deformity noted to the proximal mid humerus, 2+ radial pulse and neurovascular intact distally Normal range of motion bilateral hips and knees  Lymphadenopathy:     Cervical: No cervical adenopathy.  Skin:    General: Skin is warm and dry.     Comments: Abrasion left knee  Neurological:     Mental Status: He is alert and oriented to person, place, and time.     Comments: 5 out of 5 strength in all 4 extremities, cranial nerves II through XII intact, fluent speech  Psychiatric:        Mood and Affect: Mood normal.     ED Results / Procedures / Treatments   Labs (all labs ordered are listed, but only abnormal results are displayed) Labs Reviewed  CBC WITH DIFFERENTIAL/PLATELET - Abnormal; Notable for the following components:      Result Value   WBC 18.4 (*)    MCV 103.7 (*)    MCH 37.0 (*)    Neutro Abs 14.0 (*)     Monocytes Absolute 1.6 (*)    Abs Immature Granulocytes 0.11 (*)    All other components within normal limits  BASIC METABOLIC PANEL - Abnormal; Notable for the following components:   CO2 21 (*)    Glucose, Bld 135 (*)    Creatinine, Ser 1.38 (*)    GFR calc non Af Amer 46 (*)    GFR calc Af Amer 54 (*)    Anion gap 18 (*)    All other components within normal limits  URINALYSIS, ROUTINE W REFLEX MICROSCOPIC - Abnormal; Notable for the following components:   APPearance HAZY (*)    All other components within normal limits  LACTIC ACID, PLASMA - Abnormal; Notable for the following components:   Lactic Acid, Venous 3.2 (*)    All other components within normal limits  RESPIRATORY PANEL BY RT PCR (FLU A&B, COVID)  CULTURE, BLOOD (ROUTINE X 2)  CULTURE, BLOOD (ROUTINE X 2)  LACTIC ACID, PLASMA    EKG EKG Interpretation  Date/Time:  Tuesday November 04 2019 00:37:10 EDT Ventricular Rate:  50 PR Interval:    QRS Duration: 87 QT Interval:  472 QTC Calculation: 431 R Axis:   -13 Text Interpretation: Sinus rhythm Abnormal T, consider ischemia, lateral leads Confirmed by Thayer Jew [phone]) on 11/04/2019 4:49:02 AM   Radiology DG Chest Portable 1 View  Result Date: 11/04/2019 CLINICAL DATA:  Fall with hypoxia EXAM: PORTABLE CHEST 1 VIEW COMPARISON:  None. FINDINGS: The heart size and mediastinal contours are within normal limits. Both lungs are clear. There is left basilar atelectasis. The visualized skeletal structures are unremarkable. IMPRESSION: No active disease. Electronically Signed   By: Ulyses Jarred M.D.   On: 11/04/2019 02:52   DG Knee Complete 4  Views Left  Result Date: 11/04/2019 CLINICAL DATA:  Fall EXAM: LEFT KNEE - COMPLETE 4+ VIEW COMPARISON:  None. FINDINGS: No evidence of fracture, dislocation, or joint effusion. No evidence of arthropathy or other focal bone abnormality. Soft tissues are unremarkable. IMPRESSION: Negative. Electronically Signed   By: Ulyses Jarred M.D.   On: 11/04/2019 02:29   DG Humerus Right  Result Date: 11/04/2019 CLINICAL DATA:  Fall EXAM: RIGHT HUMERUS - 2+ VIEW COMPARISON:  None. FINDINGS: There is a laterally and posteriorly displaced oblique fracture of the midshaft right humerus. There is a comminuted fracture of the right humeral head with minimal displacement. No visible dislocation, though positioning is nonstandard. IMPRESSION: 1. Laterally and posteriorly displaced oblique fracture of the midshaft right humerus. 2. Comminuted fracture of the right humeral head. Electronically Signed   By: Ulyses Jarred M.D.   On: 11/04/2019 02:26    Procedures Procedures (including critical care time)  CRITICAL CARE Performed by: Merryl Hacker   Total critical care time: 45 minutes  Critical care time was exclusive of separately billable procedures and treating other patients.  Critical care was necessary to treat or prevent imminent or life-threatening deterioration.  Critical care was time spent personally by me on the following activities: development of treatment plan with patient and/or surrogate as well as nursing, discussions with consultants, evaluation of patient's response to treatment, examination of patient, obtaining history from patient or surrogate, ordering and performing treatments and interventions, ordering and review of laboratory studies, ordering and review of radiographic studies, pulse oximetry and re-evaluation of patient's condition.   Medications Ordered in ED Medications  oxyCODONE-acetaminophen (PERCOCET/ROXICET) 5-325 MG per tablet 1 tablet (1 tablet Oral Refused 11/04/19 0459)  HYDROmorphone (DILAUDID) injection 0.5 mg (has no administration in time range)  sodium chloride 0.9 % bolus 500 mL (0 mLs Intravenous Stopped 11/04/19 0307)  Tdap (BOOSTRIX) injection 0.5 mL (0.5 mLs Intramuscular Given 11/04/19 0209)  fentaNYL (SUBLIMAZE) injection 25 mcg (25 mcg Intravenous Given 11/04/19 0204)    sodium chloride 0.9 % bolus 1,000 mL (1,000 mLs Intravenous New Bag/Given 11/04/19 0432)  fentaNYL (SUBLIMAZE) injection 50 mcg (50 mcg Intravenous Given 11/04/19 0457)    ED Course  I have reviewed the triage vital signs and the nursing notes.  Pertinent labs & imaging results that were available during my care of the patient were reviewed by me and considered in my medical decision making (see chart for details).  Clinical Course as of Nov 04 542  Tue Nov 04, 2019  0300 On multiple reevaluations, patient overtly denies any infectious symptoms including cough, urinary symptoms, skin changes.  He denies any syncopal episodes but does state that he felt dizzy and was in significant pain when he stood up.   [CH]  [phone] Spoke with Dr. Jeannette Corpus, cardiology.  Given bradycardia, borderline hypotension, near syncope, he recommends admission for 23-hour observation.  Cardiology can formally consult in the day.   [CH]  [phone] Spoke with Dr. Onnie Graham, orthopedics.  He will review imaging and provide plan.   [CH]    Clinical Course User Index [CH] Taydon Nasworthy, Barbette Hair, MD   MDM Rules/Calculators/A&P                           Patient presents with reported mechanical fall and right arm pain.  He is overall nontoxic appearing.  Initial vital signs concerning for bradycardia in the 40s and initial blood pressures in the 60s to 80s  systolic.  He is afebrile.  Reports history of bradycardia.  Based on my chart review, most recent evaluation for bradycardia was attributed to atenolol.  Patient denies any atenolol use.  He was started on fluids.  He has an obvious deformity to the right humerus.  Denies hitting his head or loss of consciousness and he is not on any blood thinners.  At this time do not feel he needs a CT head or neck.  Given his vital sign abnormalities, broader work-up was pursued include infectious work-up.  Patient was fluid responsive and blood pressure trended upwards nicely.  Heart rate remained  40s to 60.  Work-up notable for leukocytosis to 18 with a left shift.  Additionally, he has a slight AKI.  Question some element of dehydration especially given that the patient is fluid responsive.  He does have a lactic acidosis to 3.4 but no other signs of infection including a normal chest x-ray and normal urinalysis.  Doubt sepsis as etiology.  Antibiotics have been held as I have no indicator of infection at this time.  Will trend lactates after fluid resuscitation.  Leukocytosis could be an acute phase reactant.  He is Covid negative.  X-rays of the right humerus does reveal 2 fractures.  He was placed in a sling immobilizer.  On last recheck, patient and his neighbor were updated.  While there is no obvious source of infection to create his vital sign abnormalities, I am concerned regarding his soft blood pressures and ongoing bradycardia without obvious etiology.  Patient is also not well pain control.  He has a history of chronic pain.  He was initially given fentanyl to avoid hypotension.  Now that his blood pressure is 327 systolic, patient was given oral Percocet and a small dose of Dilaudid as he has a tolerance.  We will continue to monitor closely.  Spoke with cardiology regarding his EKG.  Recommend 23-hour observation for cardiac monitoring and formal cardiology evaluation.  Regarding his fracture, orthopedics consulted and will provide plan.  He is currently in a sling immobilizer.  Patient and neighbor updated with plan of care and are agreeable.  Final Clinical Impression(s) / ED Diagnoses Final diagnoses:  Near syncope  Bradycardia  Dehydration  Closed displaced comminuted fracture of shaft of right humerus, initial encounter    Rx / DC Orders ED Discharge Orders    None       Shelba Susi, Barbette Hair, MD 11/04/19 [phone]

## 2019-11-04 NOTE — ED Notes (Signed)
ED TO INPATIENT HANDOFF REPORT  ED Nurse Name and Phone #: [phone]  S Name/Age/Gender Michael Ware 86 y.o. male Room/Bed: WA06/WA06  Code Status   Code Status: DNR  Home/SNF/Other Home Patient oriented to: self, place, time and situation Is this baseline? Yes   Triage Complete: Triage complete  Chief Complaint Humerus fracture [S42.309A]  Triage Note Patient states he fell at home and took him an hour to get to the phone to call his neighbor. Neighbor is at bedside. Patient right arm is just flapping. Patient has abrasions to right elbow and left knee. Patient was stood up and had an syncope episode. Patient put in bed with monitor, blood pressure and pulse ox placed.    Allergies Allergies  Allergen Reactions  . Atenolol Other (See Comments)    Caused heart rate to drop Other reaction(s): Other Caused heart rate to drop  . Other     Other reaction(s): Other Dry mouth  . Trazamine [Trazodone & Diet Manage Prod] Other (See Comments)    Dry mouth    Level of Care/Admitting Diagnosis ED Disposition    ED Disposition Condition Comment   Admit  Hospital Area: [facility] [[id number]]  Level of Care: Telemetry [5]  Admit to tele based on following criteria: Eval of Syncope  May admit patient to [facility] or [facility] if equivalent level of care is available:: No  Covid Evaluation: Confirmed COVID Negative  Diagnosis: Humerus fracture [035009]  Admitting Physician: Jonnie Finner [[id number]]  Attending Physician: Jonnie Finner [[id number]]  Estimated length of stay: past midnight tomorrow  Certification:: I certify this patient will need inpatient services for at least 2 midnights       B Medical/Surgery History Past Medical History:  Diagnosis Date  . Acute GI bleeding 01/19/13   most likely due to diverticulosis  . BPH (benign prostatic hypertrophy) with urinary obstruction   . Bradycardia   . Diverticulosis   . Elevated hemoglobin A1c    . Hyperglycemia   . Hyperlipidemia   . Hypertension   . Internal hemorrhoids with bleeding and fecal soiling 11/19/2014  . Rectal bleed 01/19/13  . Vitamin D deficiency    Past Surgical History:  Procedure Laterality Date  . ANAL FISSURE REPAIR  1980  . HEMORRHOID BANDING    . rectal fissure procedure  1970's  . ROTATOR CUFF REPAIR     bilateral  . TONSILLECTOMY       A IV Location/Drains/Wounds Patient Lines/Drains/Airways Status    Active Line/Drains/Airways    Name Placement date Placement time Site Days   Peripheral IV 11/04/19 Left Antecubital 11/04/19  0121  Antecubital  less than 1          Intake/Output Last 24 hours  Intake/Output Summary (Last 24 hours) at 11/04/2019 1557 Last data filed at 11/04/2019 0307 Gross per 24 hour  Intake 500 ml  Output -  Net 500 ml    Labs/Imaging Results for orders placed or performed during the hospital encounter of 11/04/19 (from the past 48 hour(s))  Respiratory Panel by RT PCR (Flu A&B, Covid) - Nasopharyngeal Swab     Status: None   Collection Time: 11/04/19 12:49 AM   Specimen: Nasopharyngeal Swab  Result Value Ref Range   SARS Coronavirus 2 by RT PCR NEGATIVE NEGATIVE    Comment: (NOTE) SARS-CoV-2 target nucleic acids are NOT DETECTED.  The SARS-CoV-2 RNA is generally detectable in upper respiratoy specimens during the acute phase of infection. The  lowest concentration of SARS-CoV-2 viral copies this assay can detect is 131 copies/mL. A negative result does not preclude SARS-Cov-2 infection and should not be used as the sole basis for treatment or other patient management decisions. A negative result may occur with  improper specimen collection/handling, submission of specimen other than nasopharyngeal swab, presence of viral mutation(s) within the areas targeted by this assay, and inadequate number of viral copies (<131 copies/mL). A negative result must be combined with clinical observations, patient history,  and epidemiological information. The expected result is Negative.  Fact Sheet for Patients:  PinkCheek.be  Fact Sheet for Healthcare Providers:  GravelBags.it  This test is no t yet approved or cleared by the [place] FDA and  has been authorized for detection and/or diagnosis of SARS-CoV-2 by FDA under an Emergency Use Authorization (EUA). This EUA will remain  in effect (meaning this test can be used) for the duration of the COVID-19 declaration under Section 564(b)(1) of the Act, 21 U.S.C. section 360bbb-3(b)(1), unless the authorization is terminated or revoked sooner.     Influenza A by PCR NEGATIVE NEGATIVE   Influenza B by PCR NEGATIVE NEGATIVE    Comment: (NOTE) The Xpert Xpress SARS-CoV-2/FLU/RSV assay is intended as an aid in  the diagnosis of influenza from Nasopharyngeal swab specimens and  should not be used as a sole basis for treatment. Nasal washings and  aspirates are unacceptable for Xpert Xpress SARS-CoV-2/FLU/RSV  testing.  Fact Sheet for Patients: PinkCheek.be  Fact Sheet for Healthcare Providers: GravelBags.it  This test is not yet approved or cleared by the [place] FDA and  has been authorized for detection and/or diagnosis of SARS-CoV-2 by  FDA under an Emergency Use Authorization (EUA). This EUA will remain  in effect (meaning this test can be used) for the duration of the  Covid-19 declaration under Section 564(b)(1) of the Act, 21  U.S.C. section 360bbb-3(b)(1), unless the authorization is  terminated or revoked. Performed at [facility], [place] [address], [place], [place] [address]   CBC with Differential     Status: Abnormal   Collection Time: 11/04/19  1:13 AM  Result Value Ref Range   WBC 18.4 (H) 4.0 - 10.5 K/uL   RBC 4.33 4.22 - 5.81 MIL/uL   Hemoglobin 16.0 13.0 - 17.0 g/dL   HCT 44.9 39 - 52 %    MCV 103.7 (H) 80.0 - 100.0 fL   MCH 37.0 (H) 26.0 - 34.0 pg   MCHC 35.6 30.0 - 36.0 g/dL   RDW 12.1 11.5 - 15.5 %   Platelets 273 150 - 400 K/uL   nRBC 0.0 0.0 - 0.2 %   Neutrophils Relative % 76 %   Neutro Abs 14.0 (H) 1.7 - 7.7 K/uL   Lymphocytes Relative 14 %   Lymphs Abs 2.7 0.7 - 4.0 K/uL   Monocytes Relative 9 %   Monocytes Absolute 1.6 (H) 0 - 1 K/uL   Eosinophils Relative 0 %   Eosinophils Absolute 0.1 0 - 0 K/uL   Basophils Relative 0 %   Basophils Absolute 0.1 0 - 0 K/uL   Immature Granulocytes 1 %   Abs Immature Granulocytes 0.11 (H) 0.00 - 0.07 K/uL    Comment: Performed at [facility], [place] [address] [place], [place] [address]  Basic metabolic panel     Status: Abnormal   Collection Time: 11/04/19  1:13 AM  Result Value Ref Range   Sodium 139 135 - 145 mmol/L  Potassium 3.7 3.5 - 5.1 mmol/L   Chloride 100 98 - 111 mmol/L   CO2 21 (L) 22 - 32 mmol/L   Glucose, Bld 135 (H) 70 - 99 mg/dL    Comment: Glucose reference range applies only to samples taken after fasting for at least 8 hours.   BUN 22 8 - 23 mg/dL   Creatinine, Ser 1.38 (H) 0.61 - 1.24 mg/dL   Calcium 9.3 8.9 - 10.3 mg/dL   GFR calc non Af Amer 46 (L) >60 mL/min   GFR calc Af Amer 54 (L) >60 mL/min   Anion gap 18 (H) 5 - 15    Comment: Performed at [facility], [place] [address] [place], [place] [address]  Magnesium     Status: Abnormal   Collection Time: 11/04/19  1:13 AM  Result Value Ref Range   Magnesium 2.5 (H) 1.7 - 2.4 mg/dL    Comment: Performed at [facility], [place] [address], [place], [place] [address]  Lactic acid, plasma     Status: Abnormal   Collection Time: 11/04/19  3:00 AM  Result Value Ref Range   Lactic Acid, Venous 3.2 (HH) 0.5 - 1.9 mmol/L    Comment: CRITICAL RESULT CALLED TO, READ BACK BY AND VERIFIED WITH: ILENE @ 6503 ON 11/04/19 C VARNER Performed at [facility], [place] [address],  [place], [place] [address]   Urinalysis, Routine w reflex microscopic Urine, Catheterized     Status: Abnormal   Collection Time: 11/04/19  4:02 AM  Result Value Ref Range   Color, Urine YELLOW YELLOW   APPearance HAZY (A) CLEAR   Specific Gravity, Urine 1.012 1.005 - 1.030   pH 5.0 5.0 - 8.0   Glucose, UA NEGATIVE NEGATIVE mg/dL   Hgb urine dipstick NEGATIVE NEGATIVE   Bilirubin Urine NEGATIVE NEGATIVE   Ketones, ur NEGATIVE NEGATIVE mg/dL   Protein, ur NEGATIVE NEGATIVE mg/dL   Nitrite NEGATIVE NEGATIVE   Leukocytes,Ua NEGATIVE NEGATIVE    Comment: Performed at [place] [address], [place], [place] [address]  Lactic acid, plasma     Status: None   Collection Time: 11/04/19  9:36 AM  Result Value Ref Range   Lactic Acid, Venous 1.3 0.5 - 1.9 mmol/L    Comment: Performed at [facility], [place] [address], [place], [place] [address]  TSH     Status: None   Collection Time: 11/04/19  9:36 AM  Result Value Ref Range   TSH 3.358 0.350 - 4.500 uIU/mL    Comment: Performed by a 3rd Generation assay with a functional sensitivity of <=0.01 uIU/mL. Performed at [facility], [place] [address], [place], [place] [address]    DG Chest Portable 1 View  Result Date: 11/04/2019 CLINICAL DATA:  Fall with hypoxia EXAM: PORTABLE CHEST 1 VIEW COMPARISON:  None. FINDINGS: The heart size and mediastinal contours are within normal limits. Both lungs are clear. There is left basilar atelectasis. The visualized skeletal structures are unremarkable. IMPRESSION: No active disease. Electronically Signed   By: Ulyses Jarred M.D.   On: 11/04/2019 02:52   DG Knee Complete 4 Views Left  Result Date: 11/04/2019 CLINICAL DATA:  Fall EXAM: LEFT KNEE - COMPLETE 4+ VIEW COMPARISON:  None. FINDINGS: No evidence of fracture, dislocation, or joint effusion. No evidence of arthropathy or other focal bone abnormality. Soft tissues are unremarkable. IMPRESSION:  Negative. Electronically Signed   By: Ulyses Jarred M.D.   On:  11/04/2019 02:29   DG Humerus Right  Result Date: 11/04/2019 CLINICAL DATA:  Fall EXAM: RIGHT HUMERUS - 2+ VIEW COMPARISON:  None. FINDINGS: There is a laterally and posteriorly displaced oblique fracture of the midshaft right humerus. There is a comminuted fracture of the right humeral head with minimal displacement. No visible dislocation, though positioning is nonstandard. IMPRESSION: 1. Laterally and posteriorly displaced oblique fracture of the midshaft right humerus. 2. Comminuted fracture of the right humeral head. Electronically Signed   By: Ulyses Jarred M.D.   On: 11/04/2019 02:26   ECHOCARDIOGRAM COMPLETE  Result Date: 11/04/2019    ECHOCARDIOGRAM REPORT   Patient Name:   Michael Ware Date of Exam: 11/04/2019 Medical Rec #:  [id number]      Height:       68.0 in Accession #:    [id number]     Weight:       201.1 lb Date of Birth:  [birth year]      BSA:          2.048 m Patient Age:    8 years       BP:           104/93 mmHg Patient Gender: M              HR:           56 bpm. Exam Location:  Inpatient Procedure: 2D Echo and Intracardiac Opacification Agent Indications:    Near syncope [833825]                 Bradycardia [[id number]]  History:        Patient has no prior history of Echocardiogram examinations.                 Risk Factors:Hypertension and Dyslipidemia.  Sonographer:    Darlina Sicilian RDCS Referring Phys: [id number] Darreld Mclean  Sonographer Comments: Technically difficult study due to poor echo windows. IMPRESSIONS  1. Very technically difficult study with poor echo windows, Definity contrast given  2. Left ventricular ejection fraction, by estimation, is 70 to 75%. The left ventricle has hyperdynamic function. The left ventricle has no regional wall motion abnormalities. There is mild left ventricular hypertrophy. Left ventricular diastolic parameters are consistent with Grade I diastolic dysfunction (impaired  relaxation).  3. Right ventricular systolic function is normal. The right ventricular size is normal.  4. The mitral valve was not well visualized. No evidence of mitral valve regurgitation.  5. The aortic valve was not well visualized. Aortic valve regurgitation is not visualized. FINDINGS  Left Ventricle: Left ventricular ejection fraction, by estimation, is 70 to 75%. The left ventricle has hyperdynamic function. The left ventricle has no regional wall motion abnormalities. Definity contrast agent was given IV to delineate the left ventricular endocardial borders. The left ventricular internal cavity size was normal in size. There is mild left ventricular hypertrophy. Left ventricular diastolic parameters are consistent with Grade I diastolic dysfunction (impaired relaxation). Indeterminate filling pressures. Right Ventricle: The right ventricular size is normal. No increase in right ventricular wall thickness. Right ventricular systolic function is normal. Left Atrium: Left atrial size was normal in size. Right Atrium: Right atrial size was normal in size. Pericardium: There is no evidence of pericardial effusion. Mitral Valve: The mitral valve was not well visualized. No evidence of mitral valve regurgitation. Tricuspid Valve: The tricuspid valve is not well visualized. Tricuspid valve regurgitation is not demonstrated. Aortic Valve: The aortic valve was not well visualized. Aortic valve  regurgitation is not visualized. Pulmonic Valve: The pulmonic valve was not well visualized. Pulmonic valve regurgitation is not visualized. Aorta: Aortic root could not be assessed. Venous: The inferior vena cava was not well visualized. IAS/Shunts: The interatrial septum was not well visualized.  LEFT VENTRICLE PLAX 2D LVIDd:         4.90 cm  Diastology LVIDs:         2.20 cm  LV e' medial:    4.90 cm/s LV PW:         1.10 cm  LV E/e' medial:  11.6 LV IVS:        1.10 cm  LV e' lateral:   5.00 cm/s LVOT diam:     2.10 cm  LV  E/e' lateral: 11.3 LV SV:         73 LV SV Index:   36 LVOT Area:     3.46 cm  RIGHT VENTRICLE RV S prime:     17.10 cm/s TAPSE (M-mode): 2.8 cm LEFT ATRIUM             Index LA diam:        3.70 cm 1.81 cm/m LA Vol (A2C):   31.7 ml 15.48 ml/m LA Vol (A4C):   45.5 ml 22.21 ml/m LA Biplane Vol: 40.7 ml 19.87 ml/m  AORTIC VALVE LVOT Vmax:   109.00 cm/s LVOT Vmean:  76.300 cm/s LVOT VTI:    0.211 m  AORTA Ao Root diam: 3.00 cm MITRAL VALVE MV Area (PHT): 1.84 cm     SHUNTS MV Decel Time: 412 msec     Systemic VTI:  0.21 m MV E velocity: 56.60 cm/s   Systemic Diam: 2.10 cm MV A velocity: 109.00 cm/s MV E/A ratio:  0.52 Lyman Bishop MD Electronically signed by Lyman Bishop MD Signature Date/Time: 11/04/2019/12:19:32 PM    Final     Pending Labs Unresulted Labs (From admission, onward)          Start     Ordered   11/04/19 0157  Blood culture (routine x 2)  BLOOD CULTURE X 2,   STAT      11/04/19 0156   Signed and Held  Comprehensive metabolic panel  Tomorrow morning,   R        Signed and Held   Signed and Held  CBC  Tomorrow morning,   R        Signed and Held          Vitals/Pain Today's Vitals   11/04/19 0922 11/04/19 1305 11/04/19 1449 11/04/19 1547  BP:    (!) 119/53  Pulse:    61  Resp:    (!) 21  Temp:    97.8 F (36.6 C)  TempSrc:    Oral  SpO2:      Weight:      Height:      PainSc: 10-Worst pain ever 6  10-Worst pain ever     Isolation Precautions No active isolations  Medications Medications  0.9 %  sodium chloride infusion ( Intravenous New Bag/Given 11/04/19 0949)  fentaNYL (SUBLIMAZE) injection 25 mcg (25 mcg Intravenous Given 11/04/19 1441)  ondansetron (ZOFRAN) tablet 4 mg (has no administration in time range)    Or  ondansetron (ZOFRAN) injection 4 mg (has no administration in time range)  sodium chloride 0.9 % bolus 500 mL (0 mLs Intravenous Stopped 11/04/19 0307)  Tdap (BOOSTRIX) injection 0.5 mL (0.5 mLs Intramuscular Given 11/04/19 0209)  fentaNYL  (SUBLIMAZE) injection 25 mcg (  25 mcg Intravenous Given 11/04/19 0204)  sodium chloride 0.9 % bolus 1,000 mL (0 mLs Intravenous Stopped 11/04/19 0921)  fentaNYL (SUBLIMAZE) injection 50 mcg (50 mcg Intravenous Given 11/04/19 0457)  oxyCODONE-acetaminophen (PERCOCET/ROXICET) 5-325 MG per tablet 1 tablet (1 tablet Oral Given 11/04/19 1449)  HYDROmorphone (DILAUDID) injection 0.5 mg (0.5 mg Intravenous Given 11/04/19 0548)    Mobility walks High fall risk   Focused Assessments .   R Recommendations: See Admitting Provider Note  Report given to:   Additional Notes: n/a

## 2019-11-04 NOTE — Progress Notes (Signed)
  Echocardiogram 2D Echocardiogram with 3D has been performed.  Darlina Sicilian M 11/04/2019, 12:16 PM

## 2019-11-04 NOTE — ED Triage Notes (Signed)
Patient states he fell at home and took him an hour to get to the phone to call his neighbor. Neighbor is at bedside. Patient right arm is just flapping. Patient has abrasions to right elbow and left knee. Patient was stood up and had an syncope episode. Patient put in bed with monitor, blood pressure and pulse ox placed.

## 2019-11-04 NOTE — ED Notes (Signed)
Pt asleep at this time and resting comfortably

## 2019-11-04 NOTE — Consult Note (Addendum)
Reason for Consult:Right humeral shaft fracture Referring Physician: Hospitalist/ Dr. Marylyn Ishihara  HPI: Michael Ware is an 86 y.o. male pleasant retired [employer] who has been a longtime patient of our practice who suffered a mechanical fall in his home with subsequent humeral shaft fracture.  He has been admitted to the emergency room and evaluated by the hospitalist as well as cardiology due to his longstanding history of bradycardia and other medical conditions to rule out need for additional treatment for presyncopal episode.  In regards to his orthopedic injury he was found to have a oblique midshaft humerus fracture and we have been requested to consult for ongoing management.  He lives in his home and is widowed for 1 year now.  His daughters live in [place] and are not local.  Past Medical History:  Diagnosis Date  . Acute GI bleeding 01/19/13   most likely due to diverticulosis  . BPH (benign prostatic hypertrophy) with urinary obstruction   . Bradycardia   . Diverticulosis   . Elevated hemoglobin A1c   . Hyperglycemia   . Hyperlipidemia   . Hypertension   . Internal hemorrhoids with bleeding and fecal soiling 11/19/2014  . Rectal bleed 01/19/13  . Vitamin D deficiency     Past Surgical History:  Procedure Laterality Date  . ANAL FISSURE REPAIR  1980  . HEMORRHOID BANDING    . rectal fissure procedure  1970's  . ROTATOR CUFF REPAIR     bilateral  . TONSILLECTOMY      Family History  Problem Relation Age of Onset  . Colon cancer Neg Hx   . Heart disease Mother   . Heart disease Father     Social History:  reports that he quit smoking about 21 years ago. He has quit using smokeless tobacco. He reports current alcohol use of about 2.0 - 3.0 standard drinks of alcohol per week. He reports that he does not use drugs.  Allergies:  Allergies  Allergen Reactions  . Atenolol Other (See Comments)    Caused heart rate to drop Other reaction(s): Other Caused heart rate  to drop  . Other     Other reaction(s): Other Dry mouth  . Trazamine [Trazodone & Diet Manage Prod] Other (See Comments)    Dry mouth    Review of Systems: as per H&P He denies CP, palpitations, dyspnea, HA, F/N/V/D, urinary symptoms, respiratory symptoms, LOC, seizure-like activity.In addition, Only other orthopedic injuries are complaints of thigh soreness and bruising to his knees   Results for orders placed or performed during the hospital encounter of 11/04/19 (from the past 48 hour(s))  Respiratory Panel by RT PCR (Flu A&B, Covid) - Nasopharyngeal Swab     Status: None   Collection Time: 11/04/19 12:49 AM   Specimen: Nasopharyngeal Swab  Result Value Ref Range   SARS Coronavirus 2 by RT PCR NEGATIVE NEGATIVE    Comment: (NOTE) SARS-CoV-2 target nucleic acids are NOT DETECTED.  The SARS-CoV-2 RNA is generally detectable in upper respiratoy specimens during the acute phase of infection. The lowest concentration of SARS-CoV-2 viral copies this assay can detect is 131 copies/mL. A negative result does not preclude SARS-Cov-2 infection and should not be used as the sole basis for treatment or other patient management decisions. A negative result may occur with  improper specimen collection/handling, submission of specimen other than nasopharyngeal swab, presence of viral mutation(s) within the areas targeted by this assay, and inadequate number of viral copies (<131 copies/mL). A negative result  must be combined with clinical observations, patient history, and epidemiological information. The expected result is Negative.  Fact Sheet for Patients:  PinkCheek.be  Fact Sheet for Healthcare Providers:  GravelBags.it  This test is no t yet approved or cleared by the [place] FDA and  has been authorized for detection and/or diagnosis of SARS-CoV-2 by FDA under an Emergency Use Authorization (EUA). This EUA will remain   in effect (meaning this test can be used) for the duration of the COVID-19 declaration under Section 564(b)(1) of the Act, 21 U.S.C. section 360bbb-3(b)(1), unless the authorization is terminated or revoked sooner.     Influenza A by PCR NEGATIVE NEGATIVE   Influenza B by PCR NEGATIVE NEGATIVE    Comment: (NOTE) The Xpert Xpress SARS-CoV-2/FLU/RSV assay is intended as an aid in  the diagnosis of influenza from Nasopharyngeal swab specimens and  should not be used as a sole basis for treatment. Nasal washings and  aspirates are unacceptable for Xpert Xpress SARS-CoV-2/FLU/RSV  testing.  Fact Sheet for Patients: PinkCheek.be  Fact Sheet for Healthcare Providers: GravelBags.it  This test is not yet approved or cleared by the [place] FDA and  has been authorized for detection and/or diagnosis of SARS-CoV-2 by  FDA under an Emergency Use Authorization (EUA). This EUA will remain  in effect (meaning this test can be used) for the duration of the  Covid-19 declaration under Section 564(b)(1) of the Act, 21  U.S.C. section 360bbb-3(b)(1), unless the authorization is  terminated or revoked. Performed at [facility], [place] [address] [place], [place] [address]   CBC with Differential     Status: Abnormal   Collection Time: 11/04/19  1:13 AM  Result Value Ref Range   WBC 18.4 (H) 4.0 - 10.5 K/uL   RBC 4.33 4.22 - 5.81 MIL/uL   Hemoglobin 16.0 13.0 - 17.0 g/dL   HCT 44.9 39 - 52 %   MCV 103.7 (H) 80.0 - 100.0 fL   MCH 37.0 (H) 26.0 - 34.0 pg   MCHC 35.6 30.0 - 36.0 g/dL   RDW 12.1 11.5 - 15.5 %   Platelets 273 150 - 400 K/uL   nRBC 0.0 0.0 - 0.2 %   Neutrophils Relative % 76 %   Neutro Abs 14.0 (H) 1.7 - 7.7 K/uL   Lymphocytes Relative 14 %   Lymphs Abs 2.7 0.7 - 4.0 K/uL   Monocytes Relative 9 %   Monocytes Absolute 1.6 (H) 0 - 1 K/uL   Eosinophils Relative 0 %   Eosinophils Absolute 0.1 0 - 0  K/uL   Basophils Relative 0 %   Basophils Absolute 0.1 0 - 0 K/uL   Immature Granulocytes 1 %   Abs Immature Granulocytes 0.11 (H) 0.00 - 0.07 K/uL    Comment: Performed at [facility], [place] [address], [place], [place] [address]  Basic metabolic panel     Status: Abnormal   Collection Time: 11/04/19  1:13 AM  Result Value Ref Range   Sodium 139 135 - 145 mmol/L   Potassium 3.7 3.5 - 5.1 mmol/L   Chloride 100 98 - 111 mmol/L   CO2 21 (L) 22 - 32 mmol/L   Glucose, Bld 135 (H) 70 - 99 mg/dL    Comment: Glucose reference range applies only to samples taken after fasting for at least 8 hours.   BUN 22 8 - 23 mg/dL   Creatinine, Ser 1.38 (H) 0.61 - 1.24 mg/dL   Calcium 9.3 8.9 -  10.3 mg/dL   GFR calc non Af Amer 46 (L) >60 mL/min   GFR calc Af Amer 54 (L) >60 mL/min   Anion gap 18 (H) 5 - 15    Comment: Performed at [facility], [place] [address], [place], [place] [address]  Magnesium     Status: Abnormal   Collection Time: 11/04/19  1:13 AM  Result Value Ref Range   Magnesium 2.5 (H) 1.7 - 2.4 mg/dL    Comment: Performed at [facility], [place] [address] [place], [place] [address]  Lactic acid, plasma     Status: Abnormal   Collection Time: 11/04/19  3:00 AM  Result Value Ref Range   Lactic Acid, Venous 3.2 (HH) 0.5 - 1.9 mmol/L    Comment: CRITICAL RESULT CALLED TO, READ BACK BY AND VERIFIED WITH: ILENE @ 0981 ON 11/04/19 C VARNER Performed at [facility], [place] [address] [place], [place] [address]   Urinalysis, Routine w reflex microscopic Urine, Catheterized     Status: Abnormal   Collection Time: 11/04/19  4:02 AM  Result Value Ref Range   Color, Urine YELLOW YELLOW   APPearance HAZY (A) CLEAR   Specific Gravity, Urine 1.012 1.005 - 1.030   pH 5.0 5.0 - 8.0   Glucose, UA NEGATIVE NEGATIVE mg/dL   Hgb urine dipstick NEGATIVE NEGATIVE   Bilirubin Urine NEGATIVE NEGATIVE   Ketones, ur NEGATIVE NEGATIVE  mg/dL   Protein, ur NEGATIVE NEGATIVE mg/dL   Nitrite NEGATIVE NEGATIVE   Leukocytes,Ua NEGATIVE NEGATIVE    Comment: Performed at [place] [address], [place], [place] [address]  Lactic acid, plasma     Status: None   Collection Time: 11/04/19  9:36 AM  Result Value Ref Range   Lactic Acid, Venous 1.3 0.5 - 1.9 mmol/L    Comment: Performed at [facility], [place] [address] [place], [place] [address]  TSH     Status: None   Collection Time: 11/04/19  9:36 AM  Result Value Ref Range   TSH 3.358 0.350 - 4.500 uIU/mL    Comment: Performed by a 3rd Generation assay with a functional sensitivity of <=0.01 uIU/mL. Performed at [facility], [place] [address], [place], [place] [address]     DG Chest Portable 1 View  Result Date: 11/04/2019 CLINICAL DATA:  Fall with hypoxia EXAM: PORTABLE CHEST 1 VIEW COMPARISON:  None. FINDINGS: The heart size and mediastinal contours are within normal limits. Both lungs are clear. There is left basilar atelectasis. The visualized skeletal structures are unremarkable. IMPRESSION: No active disease. Electronically Signed   By: Ulyses Jarred M.D.   On: 11/04/2019 02:52   DG Knee Complete 4 Views Left  Result Date: 11/04/2019 CLINICAL DATA:  Fall EXAM: LEFT KNEE - COMPLETE 4+ VIEW COMPARISON:  None. FINDINGS: No evidence of fracture, dislocation, or joint effusion. No evidence of arthropathy or other focal bone abnormality. Soft tissues are unremarkable. IMPRESSION: Negative. Electronically Signed   By: Ulyses Jarred M.D.   On: 11/04/2019 02:29   DG Humerus Right  Result Date: 11/04/2019 CLINICAL DATA:  Fall EXAM: RIGHT HUMERUS - 2+ VIEW COMPARISON:  None. FINDINGS: There is a laterally and posteriorly displaced oblique fracture of the midshaft right humerus. There is a comminuted fracture of the right humeral head with minimal displacement. No visible dislocation, though positioning is nonstandard.  IMPRESSION: 1. Laterally and posteriorly displaced oblique fracture of the midshaft right humerus. 2. Comminuted fracture  of the right humeral head. Electronically Signed   By: Ulyses Jarred M.D.   On: 11/04/2019 02:26      Physical Exam: Vitals Temp:  [98.9 F (37.2 C)] 98.9 F (37.2 C) (09/28 0137) Pulse Rate:  [45-65] 54 (09/28 0900) Resp:  [12-24] 13 (09/28 0900) BP: (68-167)/(31-79) 118/50 (09/28 0900) SpO2:  [88 %-95 %] 91 % (09/28 0900) Weight:  [91.2 kg] 91.2 kg (09/28 0045) Body mass index is 30.57 kg/m.   His right upper extremity is examined.  He does have previously well-healed scars from prior open shoulder rotator cuff repairs.  His sensation is intact to light touch.  He has excellent sensation distally in all nerve root distributions.  He has good distal pulses.  He does have obvious foreshortening of his right humerus but his skin remains intact. Assessment/Plan: Impression: Displaced oblique midshaft right humerus fracture  Treatment: I have ordered [employer] to fit him with a Sarmiento type humerus fracture brace.  Following the fracture brace we will get repeat x-rays to check for the overall alignment.  Most often we will treat these conservatively with the fracture brace for 6 to 8 weeks and ongoing management as an outpatient to follow his healing clinically and radiographically.  Given his other social situations however and the current pending admission status we will go ahead and order the x-rays to be taken after he is fitted with a fracture brace.  Once he is medically cleared it would also be helpful for occupational and physical therapy to be involved for instruction on ADLs and application of brace.  We will continue to follow to gauge his overall alignment following the fitting and application of the brace.  Once he is fitted with a brace it is okay to discontinue the use of the sling and typically gravity will allow some improvement in his overall fracture  alignment.  This is discussed with the patient.  If he is medically felt to be stable and is going to be discharged home we can follow him along as an outpatient.  Olivia Mackie Alcide Memoli for Dr. Justice Britain 11/04/2019, 12:55 PM  Contact # [phone]

## 2019-11-04 NOTE — Progress Notes (Signed)
Called ED for report. Nurse is in a contact room. She will call back for report

## 2019-11-04 NOTE — Progress Notes (Signed)
GARRETTE CAINE  MRN: [id number] DOB/Age: [id number] y.o. Physician: Ander Slade, M.D.    Contacted by ER physician regarding patient known to [facility], status post mechanical fall with moderately displaced right midshaft humerus fracture and nondisplaced proximal humeral fracture.  Plain radiographs have been reviewed.  Anticipate application of humeral fracture brace.  At this time continue with sling immobilization, position right upper extremity for comfort supported with pillows.  Strict nonweightbearing.  Will follow up later today and make arrangements for fracture brace application   Charnice Zwilling M Bluma Buresh 11/04/2019, 6:04 AM   Contact # [phone]

## 2019-11-04 NOTE — Progress Notes (Signed)
2D echo showed hyperdynamic LVF with no RWMAs.  EF 70-75%.  There is no valvular heart disease.  No further cardiac workup inpt.  Will set up 30 day outpt event monitor to assess for significant bradyarrhythmias with followup with Dr. Rayann Heman in a few weeks. Will sign off.

## 2019-11-04 NOTE — TOC Initial Note (Signed)
Transition of Care [facility]) - Initial/Assessment Note    Patient Details  Name: Michael Ware MRN: [id number] Date of Birth: 05/31/1931  [facility] CM/SW Contact:    Erenest Rasher, RN Phone Number: [phone] 11/04/2019, 3:35 PM  Clinical Narrative:                 CM spoke to pt and gave permission to speak to dtrs. TOC CM received call from dtr, [name]. States she is HCPOA along with sister. She wanted update on plan at dc. States she works remotely and could come to stay with pt. Explains it will be HH vs SNF. States her mother went to SNF rehab but she did not like it. She is leaning more toward [facility] and possible hiring a aide to assist with ADL. Pt is able to pay for aide in the home. She will be with him 24 hours to care for him. She plans to head to [facility] on 11/05/2019. Will need orders for HHPT, OT, and aide with F2F. Pt can possible qualify with Home First Program with [facility] were he can receive additional hours for aide.   Expected Discharge Plan: [place] Barriers to Discharge: Continued Medical Work up   Patient Goals and CMS Choice Patient states their goals for this hospitalization and ongoing recovery are:: daughter states she works from home and could stay with patient in his home while he recovers CMS Medicare.gov Compare Post Acute Care list provided to:: Patient Represenative (must comment) (daughter-Kelly Joya Martyr) Choice offered to / list presented to : Adult Children  Expected Discharge Plan and Services Expected Discharge Plan: [place] In-house Referral: Clinical Social Work Discharge Planning Services: CM Consult Post Acute Care Choice: [place] arrangements for the past 2 months: [place]                                      Prior Living Arrangements/Services Living arrangements for the past 2 months: Single Family Home Lives with:: Self Patient language and  need for interpreter reviewed:: Yes Do you feel safe going back to the place where you live?: Yes      Need for Family Participation in Patient Care: Yes (Comment) Care giver support system in place?: Yes (comment) Current home services: DME (rolling walker, stairlift) Criminal Activity/Legal Involvement Pertinent to Current Situation/Hospitalization: No - Comment as needed  Activities of Daily Living Home Assistive Devices/Equipment: Eyeglasses ADL Screening (condition at time of admission) Patient's cognitive ability adequate to safely complete daily activities?: Yes Is the patient deaf or have difficulty hearing?: No Does the patient have difficulty seeing, even when wearing glasses/contacts?: No Does the patient have difficulty concentrating, remembering, or making decisions?: No Patient able to express need for assistance with ADLs?: Yes Does the patient have difficulty dressing or bathing?: Yes Independently performs ADLs?: No Communication: Independent Dressing (OT): Needs assistance Is this a change from baseline?: Change from baseline, expected to last >3 days Grooming: Needs assistance Is this a change from baseline?: Change from baseline, expected to last >3 days Feeding: Needs assistance Is this a change from baseline?: Change from baseline, expected to last >3 days Bathing: Needs assistance Is this a change from baseline?: Change from baseline, expected to last >3 days Toileting: Needs assistance Is this a change from baseline?: Change from baseline, expected to last >  3days In/Out Bed: Needs assistance Is this a change from baseline?: Change from baseline, expected to last >3 days Walks in Home: Needs assistance Is this a change from baseline?: Change from baseline, expected to last >3 days Does the patient have difficulty walking or climbing stairs?: Yes Weakness of Legs: None Weakness of Arms/Hands: Right  Permission Sought/Granted Permission sought to share  information with : Case Manager, Facility Sport and exercise psychologist, PCP, Family Supports Permission granted to share information with : Yes, Verbal Permission Granted  Share Information with NAME: [name], [place] granted to share info w AGENCY: SNF, [place] granted to share info w Relationship: daughters  Permission granted to share info w Contact Information: [phone], [phone]  Emotional Assessment Appearance:: Appears stated age Attitude/Demeanor/Rapport: Gracious Affect (typically observed): Accepting Orientation: : Oriented to Self, Oriented to Place, Oriented to  Time, Oriented to Situation   Psych Involvement: No (comment)  Admission diagnosis:  Humerus fracture [S42.309A] Patient Active Problem List   Diagnosis Date Noted  . Humerus fracture 11/04/2019  . Internal hemorrhoids with bleeding and fecal soiling 11/19/2014  . Internal hemorrhoids 10/22/2014  . Medication management 12/30/2013  . Sinus bradycardia 02/16/2013  . Rectal bleeding 01/19/2013  . Routine general medical examination at a health care facility 12/24/2012  . Essential hypertension 12/24/2012  . Mixed hyperlipidemia 12/24/2012  . Abnormal glucose 12/24/2012  . ADD (attention deficit disorder)   . Vitamin D deficiency   . Elevated hemoglobin A1c   . BPH (benign prostatic hypertrophy) with urinary obstruction   . Diverticulosis of colon 01/23/2011   PCP:  Alroy Dust, L.Marlou Sa, MD Pharmacy:   [facility] [address], [place] - [address] [place] [address] Phone: [phone] Fax: [phone]     Social Determinants of Health (SDOH) Interventions    Readmission Risk Interventions No flowsheet data found.

## 2019-11-04 NOTE — Consult Note (Signed)
Cardiology Consultation:   Patient ID: Michael Ware MRN: [id number]; DOB: [birth year]  Admit date: 11/04/2019 Date of Consult: 11/04/2019  Primary Care Provider: Alroy Dust, L.Marlou Sa, MD [place] Cardiologist: Previously seen by Dr. Rayann Heman in 2015. [facility] Electrophysiologist:  None    Patient Profile:   Michael Ware is a 86 y.o. male with a history bradycardia that improved after coming off beta-blocker, hypertension, hyperlipidemia, chronic low back pain s/p right and left L2-L5 MB radiofrequency ablation in 05/2019, and depression of who is being seen today for the evaluation of bradycardia and near syncope at the request of Dr. Dina Rich.  History of Present Illness:   Michael Ware is a 86 year old male with the above history. Previously seen by Dr. Rayann Heman for bradycardia following hospitalization for GI bleed at which time he was also noted to have asymptomatic sinus bradycardia with rates in the 40's. Patient was taking Atenolol at the time. This was discontinued in the hospital and rates had improved by the time he saw Dr. Rayann Heman for follow-up. Therefore, no further cardiac evaluation was felt to be necessary. He has not been seen by Cardiology since that time.   Patient has chronic low back pain and underwent right and left L2-L5 MB radiofrequency ablation at [facility] in 05/2019.   Patient presented to the ED today shortly after midnight for evaluation of fall and near syncope. Patient state he was walking yesterday and went to pick up something off the ground. He then tripped and fell hitting his right arm in the process. He was in significant pain and had trouble standing back up. It took him about 30 minutes to arm crawl his way to a phone and he called his neighbor. He denies any prodromal symptoms - no palpitations, lightheadedness, dizziness, chest pain, or shortness of breath. No LOC. EMS reportedly stated that he had a near syncopal episode after standing but patient  adamantly denies this. No orthopnea, PND, or edema. No recent fevers or illnesses. No abnormal bleeding in urine or stools.  Upon arrival to the ED, patient initially bradycardic and hypotensive with BP as low as 68/31. EKG showed sinus bradycardia, rate 50 bpm, with T wave inversion in aVL (seen on prior tracings but more prominent) and otherwise non-specific ST/T changes. Chest x-ray showed no acute ischemic changes. Right humerus x-ray showed laterally and posteriorly displaced oblique fracture of the midshaft right humerus and comminuted fracture of the right humeral head. WBC elevated at 18.4 with absolute neutrophil count of 14, Hgb 16.0, Plts 273. Na 139, K 3.7, Glucose 135, BUN 22, Cr 1.38. Anion gap 18. Lactic acid 3.2. Urinalysis unremarkable. Patient was treated with IV fluids and pain medications. Looks like Ortho is planning on treating with fracture brace. Cardiology consulted for bradycardia and possible near syncope.  At the time of this evaluation, patient's only complaint is arm pain. BP still soft at times which is limiting narcotic use. No cardiac symptoms at this time.   Patient reports remote smoking history but quit about 30 years ago. He does have a family his of heart disease - his mother had a pacemaker. No other known family history of heart disease.    Past Medical History:  Diagnosis Date  . Acute GI bleeding 01/19/13   most likely due to diverticulosis  . BPH (benign prostatic hypertrophy) with urinary obstruction   . Bradycardia   . Diverticulosis   . Elevated hemoglobin A1c   . Hyperglycemia   . Hyperlipidemia   .  Hypertension   . Internal hemorrhoids with bleeding and fecal soiling 11/19/2014  . Rectal bleed 01/19/13  . Vitamin D deficiency     Past Surgical History:  Procedure Laterality Date  . ANAL FISSURE REPAIR  1980  . HEMORRHOID BANDING    . rectal fissure procedure  1970's  . ROTATOR CUFF REPAIR     bilateral  . TONSILLECTOMY       Home  Medications:  Prior to Admission medications   Medication Sig Start Date End Date Taking? Authorizing Provider  B Complex-C (B-COMPLEX WITH VITAMIN C) tablet Take 1 tablet by mouth daily.    Yes [provider]  calcium carbonate (OS-CAL) 600 MG TABS tablet Take 600 mg by mouth daily.    Yes [provider]  Cholecalciferol (VITAMIN D3) 5000 UNITS CAPS Take 5,000 Units by mouth daily.    Yes [provider]  CRANBERRY EXTRACT PO Take 1 capsule by mouth daily.    Yes [provider]  Cyanocobalamin (VITAMIN B-12 CR PO) Take 1 tablet by mouth daily.   Yes [provider]  diazepam (VALIUM) 10 MG tablet Take 10 mg by mouth 3 (three) times daily as needed (muscle spasms).    Yes [provider]  FLUoxetine (PROZAC) 10 MG capsule Take 10 mg by mouth daily. 10/23/19  Yes [provider]  Garlic 10 MG CAPS Take 10 mg by mouth daily.    Yes [provider]  magnesium gluconate (MAGONATE) 500 MG tablet Take 500 mg by mouth daily.    Yes [provider]  naproxen sodium (ALEVE) 220 MG tablet Take 440 mg by mouth daily as needed (back pain).   Yes [provider]  pravastatin (PRAVACHOL) 40 MG tablet Take 40 mg by mouth daily.    Yes [provider]  Saw Palmetto, Serenoa repens, (SAW PALMETTO PO) Take 1 capsule by mouth daily.    Yes [provider]  Selenium (SELENIMIN PO) Take 1 capsule by mouth daily.    Yes [provider]  Zinc 50 MG CAPS Take 50 mg by mouth daily.    Yes [provider]  HYDROcodone-acetaminophen (NORCO/VICODIN) 5-325 MG tablet Take 1 tablet by mouth every 6 (six) hours as needed for moderate pain. Patient not taking: Reported on 11/04/2019 05/27/17   Domenic Moras, PA-C  methocarbamol (ROBAXIN) 750 MG tablet Take 1 tablet (750 mg total) by mouth every 8 (eight) hours as needed for muscle spasms. Patient not taking: Reported on 11/04/2019 05/27/17   Domenic Moras, PA-C   naproxen (NAPROSYN) 500 MG tablet Take 1 tablet (500 mg total) by mouth 2 (two) times daily with a meal. Patient not taking: Reported on 05/27/2017 07/04/15   Leo Grosser, MD    Inpatient Medications: Scheduled Meds: . oxyCODONE-acetaminophen  1 tablet Oral Once   Continuous Infusions:  PRN Meds:   Allergies:    Allergies  Allergen Reactions  . Atenolol Other (See Comments)    Caused heart rate to drop Other reaction(s): Other Caused heart rate to drop  . Other     Other reaction(s): Other Dry mouth  . Trazamine [Trazodone & Diet Manage Prod] Other (See Comments)    Dry mouth    Social History:   Social History   Socioeconomic History  . Marital status: Widowed    Spouse name: Not on file  . Number of children: 2  . Years of education: Not on file  . Highest education level: Not on file  Occupational  History  . Occupation: retired [employer]  Tobacco Use  . Smoking status: Former Smoker    Quit date: 02/06/1998    Years since quitting: 21.7  . Smokeless tobacco: Former Network engineer and Sexual Activity  . Alcohol use: Yes    Alcohol/week: 2.0 - 3.0 standard drinks    Types: 2 - 3 drink(s) per week    Comment: couple drinks weekly  . Drug use: No  . Sexual activity: Not on file  Other Topics Concern  . Not on file  Social History Narrative  . Not on file   Social Determinants of Health   Financial Resource Strain:   . Difficulty of Paying Living Expenses: Not on file  Food Insecurity:   . Worried About Charity fundraiser in the Last Year: Not on file  . Ran Out of Food in the Last Year: Not on file  Transportation Needs:   . Lack of Transportation (Medical): Not on file  . Lack of Transportation (Non-Medical): Not on file  Physical Activity:   . Days of Exercise per Week: Not on file  . Minutes of Exercise per Session: Not on file  Stress:   . Feeling of Stress : Not on file  Social Connections:   . Frequency of Communication with Friends and Family:  Not on file  . Frequency of Social Gatherings with Friends and Family: Not on file  . Attends Religious Services: Not on file  . Active Member of Clubs or Organizations: Not on file  . Attends Archivist Meetings: Not on file  . Marital Status: Not on file  Intimate Partner Violence:   . Fear of Current or Ex-Partner: Not on file  . Emotionally Abused: Not on file  . Physically Abused: Not on file  . Sexually Abused: Not on file    Family History:    Family History  Problem Relation Age of Onset  . Colon cancer Neg Hx   . Heart disease Mother   . Heart disease Father      ROS:  Please see the history of present illness.  All other ROS reviewed and negative.     Physical Exam/Data:   Vitals:   11/04/19 0245 11/04/19 0300 11/04/19 0430 11/04/19 0545  BP: (!) 106/48 (!) 104/50 103/79 (!) 167/52  Pulse: (!) 50 (!) 54 (!) 53 65  Resp: 15 12 (!) 21 20  Temp:      TempSrc:      SpO2: 90% 91% 94% 95%  Weight:      Height:        Intake/Output Summary (Last 24 hours) at 11/04/2019 0723 Last data filed at 11/04/2019 0307 Gross per 24 hour  Intake 500 ml  Output --  Net 500 ml   Last 3 Weights 11/04/2019 01/25/2015 11/19/2014  Weight (lbs) 201 lb 1 oz 201 lb 197 lb 8 oz  Weight (kg) 91.2 kg 91.173 kg 89.585 kg     Body mass index is 30.57 kg/m.  General: 86 y.o. male resting comfortably in no acute distress.  HEENT: Normocephalic and atraumatic. Sclera clear. EOMs intact. Neck: Supple. Bilateral carotid bruits. No JVD. Heart: Bradycardic with normal rhythm. Distinct S1 and S2. Soft systolic murmur. No gallops or rubsRadial and distal pedal pulses 2+ and equal bilaterally. Lungs: No increased work of breathing. Clear to ausculation anteriorly (patient could not sit up due to right humerus fracture for me to fully examine bases).  Abdomen: Soft, non-distended, and non-tender to palpation.  Bowel sounds present in all 4 quadrants.  Extremities: No lower extremity  edema edema. Right arm in sling. Skin: Warm and dry. Neuro: Alert and oriented x3. No focal deficits. Psych: Normal affect. Responds appropriately.   EKG:  The EKG was personally reviewed and demonstrates: Sinus bradycardia, rate 50 bpm, with T wave inversion in aVL (seen on prior tracings but more prominent) and otherwise non-specific ST/T changes.   Telemetry:  Telemetry was personally reviewed and demonstrates: Sinus rhythm with rates mostly in the 40's to 50's (sometimes low 60's).  Relevant CV Studies: None.  Laboratory Data:  High Sensitivity Troponin:  No results for input(s): TROPONINIHS in the last 720 hours.   Chemistry Recent Labs  Lab 11/04/19 0113  NA 139  K 3.7  CL 100  CO2 21*  GLUCOSE 135*  BUN 22  CREATININE 1.38*  CALCIUM 9.3  GFRNONAA 46*  GFRAA 54*  ANIONGAP 18*    No results for input(s): PROT, ALBUMIN, AST, ALT, ALKPHOS, BILITOT in the last 168 hours. Hematology Recent Labs  Lab 11/04/19 0113  WBC 18.4*  RBC 4.33  HGB 16.0  HCT 44.9  MCV 103.7*  MCH 37.0*  MCHC 35.6  RDW 12.1  PLT 273   BNPNo results for input(s): BNP, PROBNP in the last 168 hours.  DDimer No results for input(s): DDIMER in the last 168 hours.   Radiology/Studies:  DG Chest Portable 1 View  Result Date: 11/04/2019 CLINICAL DATA:  Fall with hypoxia EXAM: PORTABLE CHEST 1 VIEW COMPARISON:  None. FINDINGS: The heart size and mediastinal contours are within normal limits. Both lungs are clear. There is left basilar atelectasis. The visualized skeletal structures are unremarkable. IMPRESSION: No active disease. Electronically Signed   By: Ulyses Jarred M.D.   On: 11/04/2019 02:52   DG Knee Complete 4 Views Left  Result Date: 11/04/2019 CLINICAL DATA:  Fall EXAM: LEFT KNEE - COMPLETE 4+ VIEW COMPARISON:  None. FINDINGS: No evidence of fracture, dislocation, or joint effusion. No evidence of arthropathy or other focal bone abnormality. Soft tissues are unremarkable. IMPRESSION:  Negative. Electronically Signed   By: Ulyses Jarred M.D.   On: 11/04/2019 02:29   DG Humerus Right  Result Date: 11/04/2019 CLINICAL DATA:  Fall EXAM: RIGHT HUMERUS - 2+ VIEW COMPARISON:  None. FINDINGS: There is a laterally and posteriorly displaced oblique fracture of the midshaft right humerus. There is a comminuted fracture of the right humeral head with minimal displacement. No visible dislocation, though positioning is nonstandard. IMPRESSION: 1. Laterally and posteriorly displaced oblique fracture of the midshaft right humerus. 2. Comminuted fracture of the right humeral head. Electronically Signed   By: Ulyses Jarred M.D.   On: 11/04/2019 02:26    Assessment and Plan:   Near Syncope - Cardiology consulted for near syncope after a mechanical fall in which patient fracture right humerus but patient adamantly denies this. Patient was initially hypotensive in the ED with systolic BP as low as 68.  - EKG and telemetry show sinus bradycardia with rates as low as the 40's but mostly 50's. No evidence of any high grade AV block. - Will check Echo.  - Consider rechecking carotid dopplers given bruits on exam.  - BP has improved with IV fluids but is still soft at times. Recommend checking orthostatics later today. - Continue telemetry while here.  Sinus Bradycardia - History of bradycardia in 2015 that improved after stopping Atenolol. Does look like he has continued to have intermittent bradycardia since that time.  -  Telemetry shows rates as low as the 40's but mostly in the 50's.  - Not on any AV nodal agents. - Potassium 3.7.  - Will check Magnesium and TSH.  - Will check Echo. - There was concern for symptomatic bradycardia but patient does not sound symptomatic with this to me. I don't think this level of bradycardia is the cause of his hypotension. Do not think he needs a pacemaker at this time. Will review with MD. Continue to monitor on telemetry.   Hyperlipidemia - Continue home  statin.  Humerus Fracture - X-ray showed laterally and posteriorly displaced oblique fracture of the midshaft right humerus and comminuted fracture of the right humeral head. - Ortho consulted and looks like they are planning to treat with fracture brace. - Pain management per Ortho and primary team.   Leukocytosis - WBC elevated at 18.  - Lactic acid elevated at 3.2.  - Chest x-ray and urinalysis unremarkable. - Blood cultures pending.  - Management per primary team.  For questions or updates, please contact [place] Please consult [email or web address] for contact info under    Signed, Darreld Mclean, PA-C  11/04/2019 7:23 AM

## 2019-11-05 ENCOUNTER — Inpatient Hospital Stay (HOSPITAL_COMMUNITY): Payer: Medicare Other

## 2019-11-05 ENCOUNTER — Other Ambulatory Visit: Payer: Self-pay | Admitting: Medical

## 2019-11-05 DIAGNOSIS — E872 Acidosis, unspecified: Secondary | ICD-10-CM

## 2019-11-05 DIAGNOSIS — R001 Bradycardia, unspecified: Secondary | ICD-10-CM

## 2019-11-05 DIAGNOSIS — R55 Syncope and collapse: Secondary | ICD-10-CM

## 2019-11-05 DIAGNOSIS — F329 Major depressive disorder, single episode, unspecified: Secondary | ICD-10-CM

## 2019-11-05 DIAGNOSIS — N179 Acute kidney failure, unspecified: Secondary | ICD-10-CM

## 2019-11-05 DIAGNOSIS — S42351A Displaced comminuted fracture of shaft of humerus, right arm, initial encounter for closed fracture: Secondary | ICD-10-CM

## 2019-11-05 DIAGNOSIS — I495 Sick sinus syndrome: Secondary | ICD-10-CM

## 2019-11-05 DIAGNOSIS — I959 Hypotension, unspecified: Secondary | ICD-10-CM

## 2019-11-05 DIAGNOSIS — E785 Hyperlipidemia, unspecified: Secondary | ICD-10-CM

## 2019-11-05 DIAGNOSIS — F32A Depression, unspecified: Secondary | ICD-10-CM

## 2019-11-05 HISTORY — PX: HUMERUS FRACTURE SURGERY: SHX670

## 2019-11-05 LAB — CBC
HCT: 42.2 % (ref 39.0–52.0)
Hemoglobin: 14 g/dL (ref 13.0–17.0)
MCH: 36.2 pg — ABNORMAL HIGH (ref 26.0–34.0)
MCHC: 33.2 g/dL (ref 30.0–36.0)
MCV: 109 fL — ABNORMAL HIGH (ref 80.0–100.0)
Platelets: 189 10*3/uL (ref 150–400)
RBC: 3.87 MIL/uL — ABNORMAL LOW (ref 4.22–5.81)
RDW: 12.3 % (ref 11.5–15.5)
WBC: 9.2 10*3/uL (ref 4.0–10.5)
nRBC: 0 % (ref 0.0–0.2)

## 2019-11-05 LAB — COMPREHENSIVE METABOLIC PANEL
ALT: 36 U/L (ref 0–44)
AST: 52 U/L — ABNORMAL HIGH (ref 15–41)
Albumin: 3.1 g/dL — ABNORMAL LOW (ref 3.5–5.0)
Alkaline Phosphatase: 64 U/L (ref 38–126)
Anion gap: 7 (ref 5–15)
BUN: 19 mg/dL (ref 8–23)
CO2: 26 mmol/L (ref 22–32)
Calcium: 8.2 mg/dL — ABNORMAL LOW (ref 8.9–10.3)
Chloride: 106 mmol/L (ref 98–111)
Creatinine, Ser: 1.07 mg/dL (ref 0.61–1.24)
GFR calc Af Amer: >60 mL/min (ref >60)
GFR calc non Af Amer: >60 mL/min (ref >60)
Glucose, Bld: 111 mg/dL — ABNORMAL HIGH (ref 70–99)
Potassium: 3.9 mmol/L (ref 3.5–5.1)
Sodium: 139 mmol/L (ref 135–145)
Total Bilirubin: 1.1 mg/dL (ref 0.3–1.2)
Total Protein: 5.4 g/dL — ABNORMAL LOW (ref 6.5–8.1)

## 2019-11-05 LAB — GLUCOSE, CAPILLARY
Glucose-Capillary: 165 mg/dL — ABNORMAL HIGH (ref 70–99)
Glucose-Capillary: 136 mg/dL — ABNORMAL HIGH (ref 70–99)

## 2019-11-05 IMAGING — DX DG HUMERUS 2V *R*
2 series · 2 of 2 positions shown · non-contrast
Comparison: Films from the previous day

CLINICAL DATA: Recent fall with humeral fracture

EXAM:
RIGHT HUMERUS - 2+ VIEW

[humerus ap]
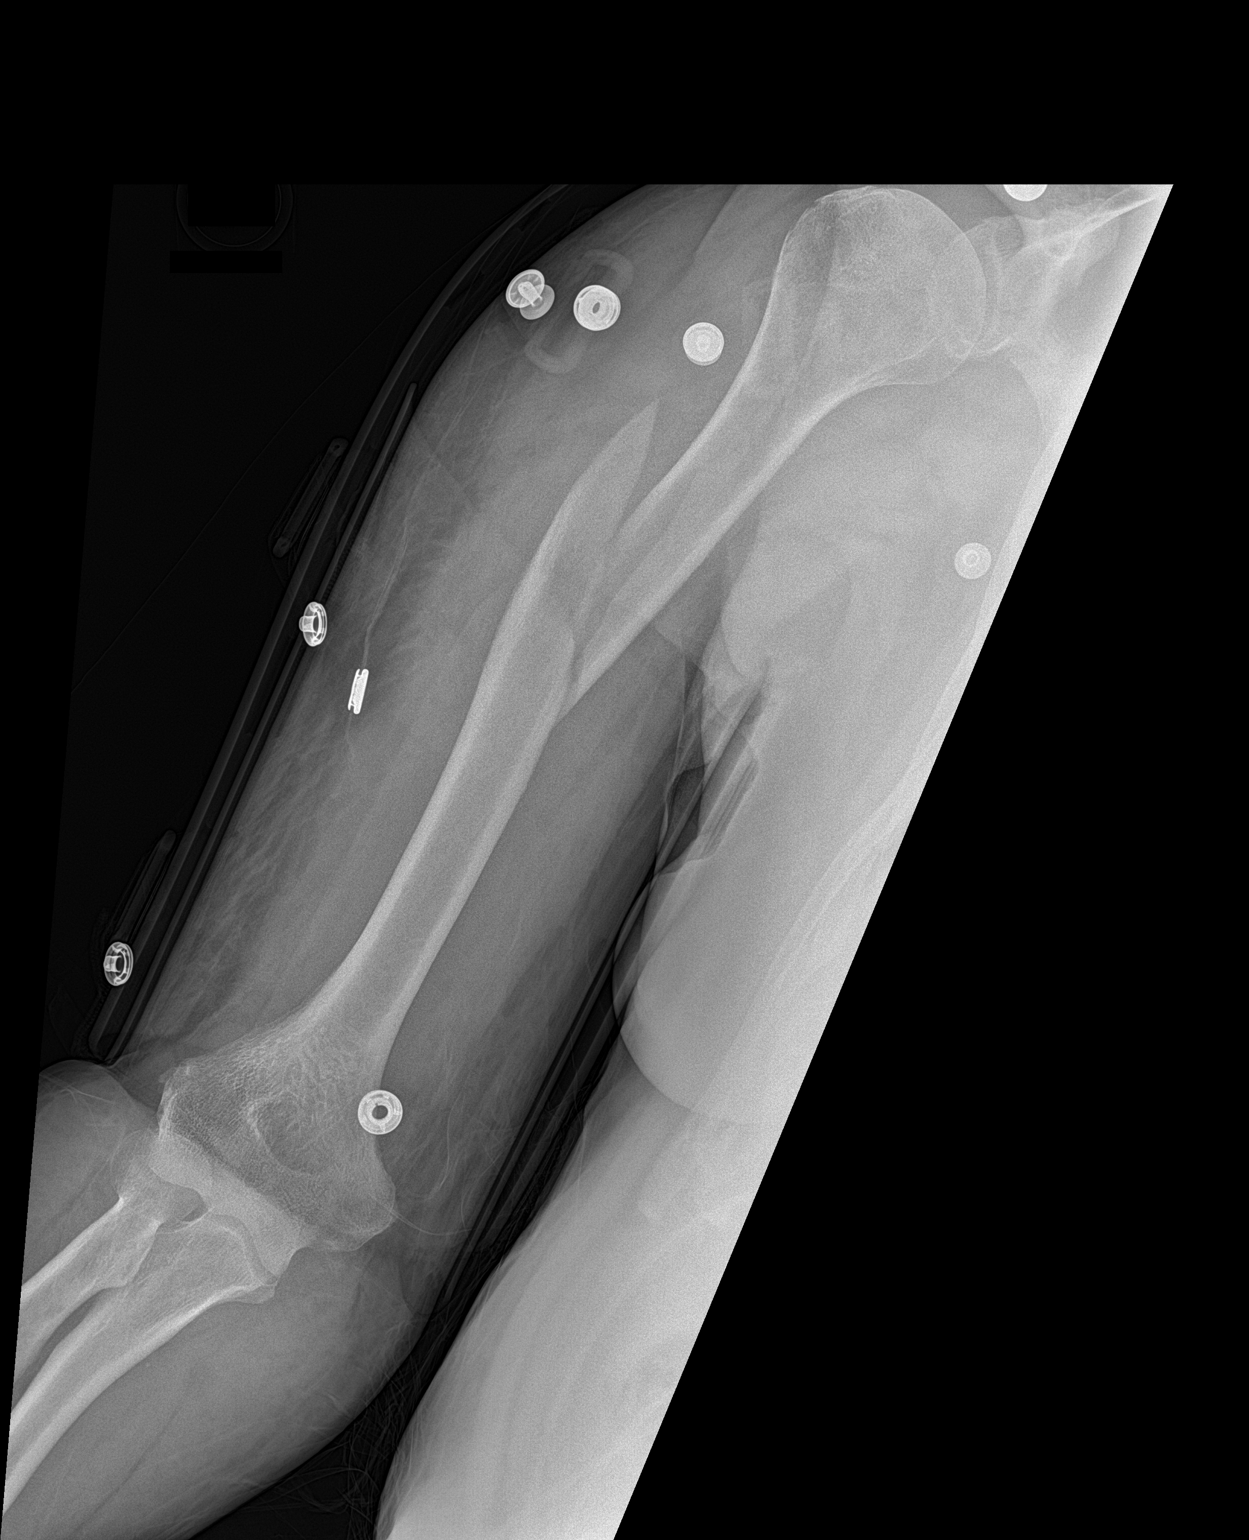

[humerus lat]
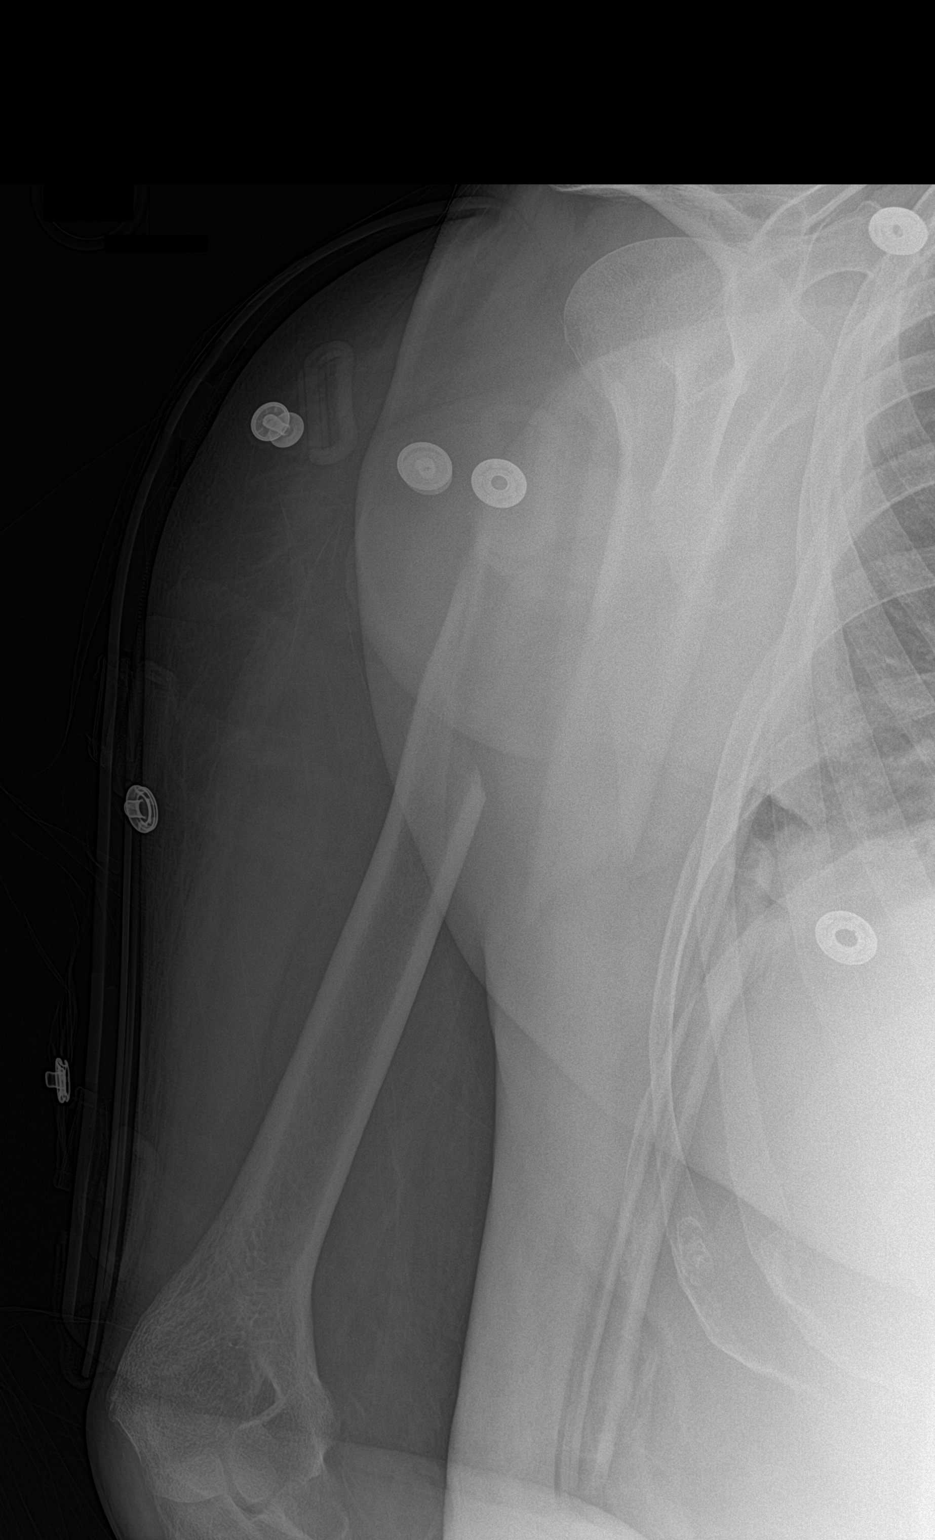

[2 of 2 positions shown; findings below may reference images not displayed]

FINDINGS: Midshaft humeral fracture is again identified with significant
displacement at the fracture site. No soft tissue abnormality is
noted.
IMPRESSION: Significant displacement at the fracture site in the mid humerus.
The overall appearance is similar to that seen on the previous day.

## 2019-11-05 MED ORDER — OXYCODONE HCL 5 MG PO TABS
5.0000 mg | ORAL_TABLET | ORAL | Status: DC | PRN
  Administered 2019-11-05 – 2019-11-06 (×5): 10 mg via ORAL
  Administered 2019-11-07: 5 mg via ORAL
  Administered 2019-11-08 – 2019-11-09 (×3): 10 mg via ORAL
  Filled 2019-11-05 (×4): qty 2
  Filled 2019-11-05: qty 1
  Filled 2019-11-05 (×4): qty 2

## 2019-11-05 MED ORDER — FLUOXETINE HCL 10 MG PO CAPS
10.0000 mg | ORAL_CAPSULE | Freq: Every day | ORAL | Status: DC
  Administered 2019-11-05 – 2019-11-14 (×10): 10 mg via ORAL
  Filled 2019-11-05 (×10): qty 1

## 2019-11-05 MED ORDER — METHOCARBAMOL 500 MG PO TABS
750.0000 mg | ORAL_TABLET | Freq: Three times a day (TID) | ORAL | Status: DC | PRN
  Administered 2019-11-10 – 2019-11-13 (×2): 750 mg via ORAL
  Filled 2019-11-05 (×2): qty 2

## 2019-11-05 MED ORDER — MORPHINE SULFATE (PF) 2 MG/ML IV SOLN
1.0000 mg | INTRAVENOUS | Status: DC | PRN
  Administered 2019-11-06: 1 mg via INTRAVENOUS
  Administered 2019-11-06 – 2019-11-09 (×5): 2 mg via INTRAVENOUS
  Filled 2019-11-05 (×6): qty 1

## 2019-11-05 NOTE — TOC Progression Note (Signed)
Transition of Care [facility]) - Progression Note    Patient Details  Name: Michael Ware MRN: [id number] Date of Birth: Jun 22, 1931  Transition of Care [facility] CM/SW Contact  Teralyn Mullins, [place], RN Phone Number: 11/05/2019, 12:49 PM  Clinical Narrative:  Patient/str agreed to fax out for SNF-await bed offers.     Expected Discharge Plan: [place] Barriers to Discharge: Continued Medical Work up  Expected Discharge Plan and Services Expected Discharge Plan: [place] In-house Referral: Clinical Social Work Discharge Planning Services: CM Consult Post Acute Care Choice: [place] arrangements for the past 2 months: Single Family Home                                       Social Determinants of Health (SDOH) Interventions    Readmission Risk Interventions No flowsheet data found.

## 2019-11-05 NOTE — Progress Notes (Signed)
Orthostatics normal and no significant bradyarrhythmias on tele today.  We have arranged for outpt event monitor and followup with Dr. Rayann Heman in 4 weeks.  No further recs.

## 2019-11-05 NOTE — NC FL2 (Signed)
[place] LEVEL OF CARE SCREENING TOOL     IDENTIFICATION  Patient Name: Michael Ware Birthdate: April 04, 1931 Sex: male Admission Date (Current Location): 11/04/2019  [facility] and [place] Number:  Herbalist and Address:  [facility],  [place], [place]      Provider Number: [id number]  Attending Physician Name and Address:  Eugenie Filler, MD  Relative Name and Phone Number:  [name] dtr [phone]    Current Level of Care: Hospital Recommended Level of Care: [place] Prior Approval Number:    Date Approved/Denied:   PASRR Number: [id number]  Discharge Plan: SNF    Current Diagnoses: Patient Active Problem List   Diagnosis Date Noted  . Humerus fracture 11/04/2019  . Internal hemorrhoids with bleeding and fecal soiling 11/19/2014  . Internal hemorrhoids 10/22/2014  . Medication management 12/30/2013  . Sinus bradycardia 02/16/2013  . Rectal bleeding 01/19/2013  . Routine general medical examination at a health care facility 12/24/2012  . Essential hypertension 12/24/2012  . Mixed hyperlipidemia 12/24/2012  . Abnormal glucose 12/24/2012  . ADD (attention deficit disorder)   . Vitamin D deficiency   . Elevated hemoglobin A1c   . BPH (benign prostatic hypertrophy) with urinary obstruction   . Diverticulosis of colon 01/23/2011    Orientation RESPIRATION BLADDER Height & Weight     Self, Time, Situation  Normal Continent Weight: 91.2 kg Height:  5\' 8"  (172.7 cm)  BEHAVIORAL SYMPTOMS/MOOD NEUROLOGICAL BOWEL NUTRITION STATUS      Continent Diet (Regular)  AMBULATORY STATUS COMMUNICATION OF NEEDS Skin   Limited Assist Verbally  (R arm sling)                       Personal Care Assistance Level of Assistance  Bathing, Feeding, Dressing   Feeding assistance: Limited assistance Dressing Assistance: Limited assistance     Functional Limitations Info  Sight, Speech, Hearing  Sight Info: Impaired (eyeglasses) Hearing Info: Adequate Speech Info: Adequate    SPECIAL CARE FACTORS FREQUENCY  PT (By licensed PT), OT (By licensed OT)     PT Frequency: 5x week OT Frequency: 5x week            Contractures Contractures Info: Not present    Additional Factors Info  Code Status, Allergies Code Status Info: DNR Allergies Info:  (Atenolol, Other, Trazamine Trazodone & Diet Manage Prod)           Current Medications (11/05/2019):  This is the current hospital active medication list Current Facility-Administered Medications  Medication Dose Route Frequency Provider Last Rate Last Admin  . 0.9 %  sodium chloride infusion   Intravenous Continuous Eugenie Filler, MD 75 mL/hr at 11/05/19 8341 Rate Change at 11/05/19 0922  . acetaminophen (TYLENOL) tablet 650 mg  650 mg Oral Q6H PRN Marylyn Ishihara, Tyrone A, DO       Or  . acetaminophen (TYLENOL) suppository 650 mg  650 mg Rectal Q6H PRN Marylyn Ishihara, Tyrone A, DO      . calcium carbonate (OS-CAL - dosed in mg of elemental calcium) tablet 625 mg  625 mg Oral Q breakfast Kyle, Tyrone A, DO   625 mg at 11/05/19 0923  . cholecalciferol (VITAMIN D3) tablet 5,000 Units  5,000 Units Oral Daily Kyle, Tyrone A, DO   5,000 Units at 11/04/19 1722  . docusate sodium (COLACE) capsule 100 mg  100 mg Oral BID Marylyn Ishihara, Tyrone A, DO  100 mg at 11/05/19 0924  . fentaNYL (SUBLIMAZE) injection 25 mcg  25 mcg Intravenous Q4H PRN Marylyn Ishihara, Tyrone A, DO   25 mcg at 11/04/19 1441  . magnesium gluconate (MAGONATE) tablet 500 mg  500 mg Oral Daily Kyle, Tyrone A, DO   500 mg at 11/04/19 1722  . ondansetron (ZOFRAN) tablet 4 mg  4 mg Oral Q6H PRN Marylyn Ishihara, Tyrone A, DO       Or  . ondansetron (ZOFRAN) injection 4 mg  4 mg Intravenous Q6H PRN Marylyn Ishihara, Tyrone A, DO      . oxyCODONE (Oxy IR/ROXICODONE) immediate release tablet 5-10 mg  5-10 mg Oral Q4H PRN Eugenie Filler, MD      . pravastatin (PRAVACHOL) tablet 40 mg  40 mg Oral Daily Kyle, Tyrone A, DO   40 mg at  11/05/19 2641  . zinc sulfate capsule 220 mg  220 mg Oral Daily Kyle, Tyrone A, DO   220 mg at 11/04/19 1722     Discharge Medications: Please see discharge summary for a list of discharge medications.  Relevant Imaging Results:  Relevant Lab Results:   Additional Information SS#[id number] [place], [place]

## 2019-11-05 NOTE — Progress Notes (Signed)
PROGRESS NOTE    Michael Ware  PQZ:[id number] DOB: Apr 17, 1931 DOA: 11/04/2019 PCP: Alroy Dust, L.Marlou Sa, MD    Chief Complaint  Patient presents with  . Near Syncope  . Fall    Brief Narrative:  Patient is a 86 year old gentleman prior history of bradycardia, vitamin D deficiency, presented to the ED after mechanical fall.  Patient denies any head injury or loss of consciousness.  When EMS arrived at patient's home patient noted to have a near syncopal episode when they helped him off the floor.  Imaging showed a right humerus fracture.  EKG done was concerning for bradycardia.  Patient also noted to be hypotensive on admission.  Orthopedics and cardiology consulted and following.   Assessment & Plan:   Active Problems:   Humerus fracture   Bradycardia   Near syncope   Depression   Lactic acidosis   AKI (acute kidney injury) (HCC)   Hyperlipidemia   Hypotension  #1 presyncope Likely secondary to hypotension versus vasovagal secondary to pain from humerus fracture.  Patient noted when EMS arrived to have a near syncopal episode.  Patient noted on admission presentation to be hypotensive and placed on IV fluids.  Repeat orthostatics done today unremarkable.  2D echo with very technically difficult study with poor echo windows, EF of 70 to 75%, mild left ventricular hypertrophy, grade 1 diastolic dysfunction.  Patient seen in consultation by cardiology who are recommending outpatient event monitor.  Continue hydration with IV fluids.  Supportive care.  2.  Right humerus fracture Secondary to mechanical fall.  Patient currently in the sling.  Patient seen in consultation by orthopedics who are recommending conservative treatment at this time, also take to fit patient with Sarmiento type humerus fracture brace with repeat x-rays to follow-up on overall alignment.  Orthopedics recommending conservative treatment for about 6 to 8 weeks with outpatient follow-up.  Patient with complaints of  pain not relieved on current pain regimen.  Increase oxycodone to 1 to 2 tablets every 4 hours as needed for pain.  IV morphine as needed.  Follow.  Per orthopedics.  3.  Bradycardia Questionable etiology.  Patient noted to be bradycardic on admission.  2D echo done with a EF of 70 to 75%, no wall motion abnormalities, grade 1 diastolic dysfunction.  Patient noted to be hypotensive on admission which was felt likely could be from a vasovagal event.  Heart rate improved.  Patient seen in consultation by cardiology who are recommending 30-day event monitor to assess for symptomatic bradycardia arrhythmias with outpatient follow-up with Dr. Rayann Heman in 4 weeks.  Appreciate cardiology input and recommendations.  4.  Hypotension Likely secondary to hypovolemia as patient noted to be hypotensive on admission.  Blood pressure seems to have responded to IV fluids.  Continue IV fluids for another 24 hours.  Minimize narcotics.  Follow.  5.  Acute kidney injury Resolved with hydration.  6.  Leukocytosis Likely reactive leukocytosis.  Chest x-ray with no acute infiltrate.  Urinalysis unremarkable.  Patient currently afebrile.  Leukocytosis has trended down.  No need for antibiotics.  7.  Depression Prozac.  8.  Hyperlipidemia Continue statin.  9.  Lactic acidosis/HAGMA Likely secondary to dehydration/volume depletion in the setting of hypotension.  Urinalysis unremarkable.  Chest x-ray unremarkable.  Patient currently afebrile.  Lactic acidosis has resolved with hydration.  Anion gap has normalized.  Follow.   DVT prophylaxis: SCDs Code Status: DNR Family Communication: Updated patient.  No family at bedside. Disposition:   Status is: Inpatient  Dispo: The patient is from: Home              Anticipated d/c is to: SNF versus home with home health              Anticipated d/c date is: 1 to 2 days              Patient currently in sling, not stable for discharge.       Consultants:    Orthopedics: Jenetta Loges, PA/Dr. Onnie Graham 11/04/2019  Cardiology: Dr. Radford Pax 11/04/2019  Procedures:   Plain films of the right humerus 11/05/2019, 11/04/2019  Chest x-ray 11/04/2019  Plain films of the left knee 11/04/2019  2D echo 11/04/2019  Antimicrobials:   None   Subjective: Laying in bed.  Right upper extremity in sling.  Denies any chest pain or shortness of breath.  No abdominal pain.  Complaining of pain in the right upper extremity states pain medication is not helping much with his pain.  Resistant to idea of SNF states his daughter is driving up over 272 miles an he will discussed with her in terms of being able to go home with home health therapies.  Objective: Vitals:   11/04/19 2051 11/05/19 0100 11/05/19 0533 11/05/19 1326  BP: (!) 131/57 120/80 (!) 143/53 (!) 113/97  Pulse: 63 60 63 65  Resp: 18 20 20 18   Temp: 97.9 F (36.6 C) 97.8 F (36.6 C) 97.8 F (36.6 C) 99.4 F (37.4 C)  TempSrc: Oral Oral  Oral  SpO2: 91% 92% 90% 91%  Weight:      Height:        Intake/Output Summary (Last 24 hours) at 11/05/2019 1815 Last data filed at 11/05/2019 1541 Gross per 24 hour  Intake 2818.58 ml  Output 1125 ml  Net 1693.58 ml   Filed Weights   11/04/19 0045  Weight: 91.2 kg    Examination:  General exam: Appears calm and comfortable  Respiratory system: Clear to auscultation. Respiratory effort normal. Cardiovascular system: S1 & S2 heard, RRR. No JVD, murmurs, rubs, gallops or clicks. No pedal edema. Gastrointestinal system: Abdomen is nondistended, soft and nontender. No organomegaly or masses felt. Normal bowel sounds heard. Central nervous system: Alert and oriented. No focal neurological deficits. Extremities: Right upper extremity in sling. Skin: No rashes, lesions or ulcers Psychiatry: Judgement and insight appear normal. Mood & affect appropriate.     Data Reviewed: I have personally reviewed following labs and imaging studies  CBC: Recent  Labs  Lab 11/04/19 0113 11/05/19 0453  WBC 18.4* 9.2  NEUTROABS 14.0*  --   HGB 16.0 14.0  HCT 44.9 42.2  MCV 103.7* 109.0*  PLT 273 536    Basic Metabolic Panel: Recent Labs  Lab 11/04/19 0113 11/05/19 0453  NA 139 139  K 3.7 3.9  CL 100 106  CO2 21* 26  GLUCOSE 135* 111*  BUN 22 19  CREATININE 1.38* 1.07  CALCIUM 9.3 8.2*  MG 2.5*  --     GFR: Estimated Creatinine Clearance: 55.3 mL/min (by C-G formula based on SCr of 1.07 mg/dL).  Liver Function Tests: Recent Labs  Lab 11/05/19 0453  AST 52*  ALT 36  ALKPHOS 64  BILITOT 1.1  PROT 5.4*  ALBUMIN 3.1*    CBG: Recent Labs  Lab 11/05/19 1130 11/05/19 1747  GLUCAP 165* 136*     Recent Results (from the past 240 hour(s))  Respiratory Panel by RT PCR (Flu A&B, Covid) - Nasopharyngeal Swab  Status: None   Collection Time: 11/04/19 12:49 AM   Specimen: Nasopharyngeal Swab  Result Value Ref Range Status   SARS Coronavirus 2 by RT PCR NEGATIVE NEGATIVE Final    Comment: (NOTE) SARS-CoV-2 target nucleic acids are NOT DETECTED.  The SARS-CoV-2 RNA is generally detectable in upper respiratoy specimens during the acute phase of infection. The lowest concentration of SARS-CoV-2 viral copies this assay can detect is 131 copies/mL. A negative result does not preclude SARS-Cov-2 infection and should not be used as the sole basis for treatment or other patient management decisions. A negative result may occur with  improper specimen collection/handling, submission of specimen other than nasopharyngeal swab, presence of viral mutation(s) within the areas targeted by this assay, and inadequate number of viral copies (<131 copies/mL). A negative result must be combined with clinical observations, patient history, and epidemiological information. The expected result is Negative.  Fact Sheet for Patients:  PinkCheek.be  Fact Sheet for Healthcare Providers:   GravelBags.it  This test is no t yet approved or cleared by the [place] FDA and  has been authorized for detection and/or diagnosis of SARS-CoV-2 by FDA under an Emergency Use Authorization (EUA). This EUA will remain  in effect (meaning this test can be used) for the duration of the COVID-19 declaration under Section 564(b)(1) of the Act, 21 U.S.C. section 360bbb-3(b)(1), unless the authorization is terminated or revoked sooner.     Influenza A by PCR NEGATIVE NEGATIVE Final   Influenza B by PCR NEGATIVE NEGATIVE Final    Comment: (NOTE) The Xpert Xpress SARS-CoV-2/FLU/RSV assay is intended as an aid in  the diagnosis of influenza from Nasopharyngeal swab specimens and  should not be used as a sole basis for treatment. Nasal washings and  aspirates are unacceptable for Xpert Xpress SARS-CoV-2/FLU/RSV  testing.  Fact Sheet for Patients: PinkCheek.be  Fact Sheet for Healthcare Providers: GravelBags.it  This test is not yet approved or cleared by the [place] FDA and  has been authorized for detection and/or diagnosis of SARS-CoV-2 by  FDA under an Emergency Use Authorization (EUA). This EUA will remain  in effect (meaning this test can be used) for the duration of the  Covid-19 declaration under Section 564(b)(1) of the Act, 21  U.S.C. section 360bbb-3(b)(1), unless the authorization is  terminated or revoked. Performed at [facility], [place] [address], [place], [place] [address]   Blood culture (routine x 2)     Status: None (Preliminary result)   Collection Time: 11/04/19  1:57 AM   Specimen: BLOOD  Result Value Ref Range Status   Specimen Description   Final    BLOOD LEFT ANTECUBITAL Performed at [facility] [address], [place], [place] [address]    Special Requests   Final    BOTTLES DRAWN AEROBIC AND ANAEROBIC Blood Culture  results may not be optimal due to an excessive volume of blood received in culture bottles Performed at [facility] [address] [place], [place] [address]    Culture   Final    NO GROWTH 1 DAY Performed at [facility], [place] [address], [place], [place] [address]    Report Status PENDING  Incomplete  Blood culture (routine x 2)     Status: None (Preliminary result)   Collection Time: 11/04/19  9:36 AM   Specimen: BLOOD LEFT HAND  Result Value Ref Range Status   Specimen Description   Final    BLOOD LEFT HAND  Performed at [facility], [place] [address] [place], [place] [address]    Special Requests   Final    BOTTLES DRAWN AEROBIC AND ANAEROBIC Blood Culture results may not be optimal due to an inadequate volume of blood received in culture bottles Performed at [facility] [address] [place], [place] [address]    Culture   Final    NO GROWTH < 24 HOURS Performed at [facility] [address] [place], [place] [address]    Report Status PENDING  Incomplete         Radiology Studies: DG Chest Portable 1 View  Result Date: 11/04/2019 CLINICAL DATA:  Fall with hypoxia EXAM: PORTABLE CHEST 1 VIEW COMPARISON:  None. FINDINGS: The heart size and mediastinal contours are within normal limits. Both lungs are clear. There is left basilar atelectasis. The visualized skeletal structures are unremarkable. IMPRESSION: No active disease. Electronically Signed   By: Ulyses Jarred M.D.   On: 11/04/2019 02:52   DG Knee Complete 4 Views Left  Result Date: 11/04/2019 CLINICAL DATA:  Fall EXAM: LEFT KNEE - COMPLETE 4+ VIEW COMPARISON:  None. FINDINGS: No evidence of fracture, dislocation, or joint effusion. No evidence of arthropathy or other focal bone abnormality. Soft tissues are unremarkable. IMPRESSION: Negative. Electronically Signed   By: Ulyses Jarred M.D.   On: 11/04/2019 02:29   DG Humerus Right  Result  Date: 11/05/2019 CLINICAL DATA:  Recent fall with humeral fracture EXAM: RIGHT HUMERUS - 2+ VIEW COMPARISON:  Films from the previous day FINDINGS: Midshaft humeral fracture is again identified with significant displacement at the fracture site. No soft tissue abnormality is noted. IMPRESSION: Significant displacement at the fracture site in the mid humerus. The overall appearance is similar to that seen on the previous day. Electronically Signed   By: Inez Catalina M.D.   On: 11/05/2019 13:13   DG Humerus Right  Result Date: 11/04/2019 CLINICAL DATA:  Fall EXAM: RIGHT HUMERUS - 2+ VIEW COMPARISON:  None. FINDINGS: There is a laterally and posteriorly displaced oblique fracture of the midshaft right humerus. There is a comminuted fracture of the right humeral head with minimal displacement. No visible dislocation, though positioning is nonstandard. IMPRESSION: 1. Laterally and posteriorly displaced oblique fracture of the midshaft right humerus. 2. Comminuted fracture of the right humeral head. Electronically Signed   By: Ulyses Jarred M.D.   On: 11/04/2019 02:26   ECHOCARDIOGRAM COMPLETE  Result Date: 11/04/2019    ECHOCARDIOGRAM REPORT   Patient Name:   VERNICE BOWKER Date of Exam: 11/04/2019 Medical Rec #:  [id number]      Height:       68.0 in Accession #:    [id number]     Weight:       201.1 lb Date of Birth:  January 07, 1932      BSA:          2.048 m Patient Age:    5 years       BP:           104/93 mmHg Patient Gender: M              HR:           56 bpm. Exam Location:  Inpatient Procedure: 2D Echo and Intracardiac Opacification Agent Indications:    Near syncope [[id number]]                 Bradycardia [[id number]]  History:  Patient has no prior history of Echocardiogram examinations.                 Risk Factors:Hypertension and Dyslipidemia.  Sonographer:    Darlina Sicilian RDCS Referring Phys: [id number] Darreld Mclean  Sonographer Comments: Technically difficult study due to poor echo windows.  IMPRESSIONS  1. Very technically difficult study with poor echo windows, Definity contrast given  2. Left ventricular ejection fraction, by estimation, is 70 to 75%. The left ventricle has hyperdynamic function. The left ventricle has no regional wall motion abnormalities. There is mild left ventricular hypertrophy. Left ventricular diastolic parameters are consistent with Grade I diastolic dysfunction (impaired relaxation).  3. Right ventricular systolic function is normal. The right ventricular size is normal.  4. The mitral valve was not well visualized. No evidence of mitral valve regurgitation.  5. The aortic valve was not well visualized. Aortic valve regurgitation is not visualized. FINDINGS  Left Ventricle: Left ventricular ejection fraction, by estimation, is 70 to 75%. The left ventricle has hyperdynamic function. The left ventricle has no regional wall motion abnormalities. Definity contrast agent was given IV to delineate the left ventricular endocardial borders. The left ventricular internal cavity size was normal in size. There is mild left ventricular hypertrophy. Left ventricular diastolic parameters are consistent with Grade I diastolic dysfunction (impaired relaxation). Indeterminate filling pressures. Right Ventricle: The right ventricular size is normal. No increase in right ventricular wall thickness. Right ventricular systolic function is normal. Left Atrium: Left atrial size was normal in size. Right Atrium: Right atrial size was normal in size. Pericardium: There is no evidence of pericardial effusion. Mitral Valve: The mitral valve was not well visualized. No evidence of mitral valve regurgitation. Tricuspid Valve: The tricuspid valve is not well visualized. Tricuspid valve regurgitation is not demonstrated. Aortic Valve: The aortic valve was not well visualized. Aortic valve regurgitation is not visualized. Pulmonic Valve: The pulmonic valve was not well visualized. Pulmonic valve  regurgitation is not visualized. Aorta: Aortic root could not be assessed. Venous: The inferior vena cava was not well visualized. IAS/Shunts: The interatrial septum was not well visualized.  LEFT VENTRICLE PLAX 2D LVIDd:         4.90 cm  Diastology LVIDs:         2.20 cm  LV e' medial:    4.90 cm/s LV PW:         1.10 cm  LV E/e' medial:  11.6 LV IVS:        1.10 cm  LV e' lateral:   5.00 cm/s LVOT diam:     2.10 cm  LV E/e' lateral: 11.3 LV SV:         73 LV SV Index:   36 LVOT Area:     3.46 cm  RIGHT VENTRICLE RV S prime:     17.10 cm/s TAPSE (M-mode): 2.8 cm LEFT ATRIUM             Index LA diam:        3.70 cm 1.81 cm/m LA Vol (A2C):   31.7 ml 15.48 ml/m LA Vol (A4C):   45.5 ml 22.21 ml/m LA Biplane Vol: 40.7 ml 19.87 ml/m  AORTIC VALVE LVOT Vmax:   109.00 cm/s LVOT Vmean:  76.300 cm/s LVOT VTI:    0.211 m  AORTA Ao Root diam: 3.00 cm MITRAL VALVE MV Area (PHT): 1.84 cm     SHUNTS MV Decel Time: 412 msec     Systemic VTI:  0.21 m  MV E velocity: 56.60 cm/s   Systemic Diam: 2.10 cm MV A velocity: 109.00 cm/s MV E/A ratio:  0.52 Lyman Bishop MD Electronically signed by Lyman Bishop MD Signature Date/Time: 11/04/2019/12:19:32 PM    Final         Scheduled Meds: . calcium carbonate  625 mg Oral Q breakfast  . cholecalciferol  5,000 Units Oral Daily  . docusate sodium  100 mg Oral BID  . magnesium gluconate  500 mg Oral Daily  . pravastatin  40 mg Oral Daily  . zinc sulfate  220 mg Oral Daily   Continuous Infusions: . sodium chloride 75 mL/hr at 11/05/19 1594     LOS: 1 day    Time spent: 35 minutes    Irine Seal, MD [facility]   To contact the attending provider between 7A-7P or the covering provider during after hours 7P-7A, please log into the web site [email or web address] and access using universal [place] password for that web site. If you do not have the password, please call the hospital operator.  11/05/2019, 6:15 PM

## 2019-11-05 NOTE — Progress Notes (Signed)
Occupational Therapy Evaluation  Patient with functional deficits listed below impacting safety and independence with self care. Patient lives alone at baseline, reports DTR who can work from home will come stay with him "as long as I need." Patient currently requiring modA for bed mobility, mod to max A for UB/LB ADLs and min A for stand pivot transfer to recliner chair. Patient having reported 10/10 pain in R UE at rest and with mobility with sarmiento brace in place. Recommend continued acute OT services in order to facilitate D/C to venue listed below.    11/05/19 1452  OT Visit Information  Last OT Received On 11/05/19  Assistance Needed +1  History of Present Illness 86 yo male admitted with R midshaft humerus fx after falling at home. Hx of bradycardia  Precautions  Precautions Fall;Shoulder  Shoulder Interventions At all times;Shoulder sling/immobilizer  Required Braces or Orthoses Other Brace  Other Brace  Sarmiento fracture brace   Restrictions  Weight Bearing Restrictions Yes  RUE Weight Bearing NWB  Home Living  Family/patient expects to be discharged to: Private residence  Living Arrangements Alone  Available Help at Discharge Family (per patient, DTR coming to stay with him)  Type of [place] to enter  Entrance Stairs-Number of Steps 3  Entrance Stairs-Rails Right;Left  Home Layout Bed/bath upstairs;Two level  Alternate Level Stairs-Number of Steps patient has stair lift  Bathroom Shower/Tub Walk-in shower  [place] riser;Shower seat;Grab bars - tub/shower;Cane - single point;Walker - 2 wheels;Walker - 4 wheels  Additional Comments majority of home equipment was from spouse who has passed away  Prior Function  Level of Independence Independent  Communication  Communication No difficulties  Pain Assessment  Pain Assessment 0-10  Pain Score 10  Pain Location 10/10 at rest before eval; 10/10 at  end of session  Pain Descriptors / Indicators Aching;Sharp  Pain Intervention(s) Monitored during session  Cognition  Arousal/Alertness Awake/alert  Behavior During Therapy WFL for tasks assessed/performed  Overall Cognitive Status Within Functional Limits for tasks assessed  Upper Extremity Assessment  Upper Extremity Assessment RUE deficits/detail  RUE Deficits / Details grossly intact AROM digits, Sarmiento brace in place   RUE Unable to fully assess due to pain;Unable to fully assess due to immobilization  Lower Extremity Assessment  Lower Extremity Assessment Defer to PT evaluation  Cervical / Trunk Assessment  Cervical / Trunk Assessment Normal  ADL  Overall ADL's  Needs assistance/impaired  Eating/Feeding Set up;Sitting  Grooming Set up;Sitting  Upper Body Bathing Sitting;Moderate assistance  Lower Body Bathing Maximal assistance;Sitting/lateral leans;Sit to/from stand  Upper Body Dressing  Moderate assistance;Sitting  Lower Body Dressing Maximal assistance;Sitting/lateral leans;Sit to/from Ambulance person Details (indicate cue type and reason) to recliner, min A for stability  Toileting- Clothing Manipulation and Hygiene Moderate assistance;Sitting/lateral lean;Sit to/from stand  Functional mobility during ADLs Minimal assistance  General ADL Comments patient requiring increased assistance for self care due to pain, decreased activity tolerance, safety awareness, balance  Bed Mobility  Overal bed mobility Needs Assistance  Bed Mobility Supine to Sit  Supine to sit Mod assist;HOB elevated  General bed mobility comments cues for sequencing scooting, mod A to mobilize trunk and to scoot out to EOB using pad  Transfers  Overall transfer level Needs assistance  Equipment used 1 person hand held assist  Transfers Sit to/from [employer] Transfers  Sit to Stand Min assist;From elevated surface  Stand pivot  transfers Min assist  General transfer comment instruct patient to push with L UE and use momentum to power up to standing from elevated bed height, hand held assist x1 to take few steps to recliner chair and stabilize for controlled descent into chair  Balance  Overall balance assessment Needs assistance;History of Falls  Sitting-balance support Feet supported  Sitting balance-Leahy Scale Fair  Standing balance support Single extremity supported  Standing balance-Leahy Scale Poor  Exercises  Exercises Other exercises  Other Exercises  Other Exercises instruct patient in fist pumps and ankle pumps  OT - End of Session  Equipment Utilized During Treatment Other (comment) (R UE sarmiento brace)  Activity Tolerance Patient tolerated treatment well;Patient limited by pain  Patient left in chair;with call bell/phone within reach;with chair alarm set  Nurse Communication Mobility status  OT Assessment  OT Recommendation/Assessment Patient needs continued OT Services  OT Visit Diagnosis Unsteadiness on feet (R26.81);History of falling (Z91.81);Pain  Pain - Right/Left Right  Pain - part of body Shoulder  OT Problem List Decreased strength;Decreased activity tolerance;Decreased range of motion;Impaired balance (sitting and/or standing);Decreased coordination;Decreased safety awareness;Decreased knowledge of use of DME or AE;Decreased knowledge of precautions;Pain;Obesity;Impaired UE functional use  OT Plan  OT Frequency (ACUTE ONLY) Min 2X/week  OT Treatment/Interventions (ACUTE ONLY) Self-care/ADL training;Therapeutic exercise;Energy conservation;DME and/or AE instruction;Therapeutic activities;Patient/family education;Balance training  AM-PAC OT "6 Clicks" Daily Activity Outcome Measure (Version 2)  Help from another person eating meals? 3  Help from another person taking care of personal grooming? 3  Help from another person toileting, which includes using toliet, bedpan, or urinal? 2  Help  from another person bathing (including washing, rinsing, drying)? 2  Help from another person to put on and taking off regular upper body clothing? 2  Help from another person to put on and taking off regular lower body clothing? 2  6 Click Score 14  OT Recommendation  Follow Up Recommendations SNF;Supervision/Assistance - 24 hour;Other (comment) (vs HH if patient declines)  OT Equipment None recommended by OT  Individuals Consulted  Consulted and Agree with Results and Recommendations Patient  Acute Rehab OT Goals  Patient Stated Goal go home  OT Goal Formulation With patient  Time For Goal Achievement 11/19/19  Potential to Achieve Goals Good  OT Time Calculation  OT Start Time (ACUTE ONLY) 1137  OT Stop Time (ACUTE ONLY) 1219  OT Time Calculation (min) 42 min  OT General Charges  $OT Visit 1 Visit  OT Evaluation  $OT Eval Moderate Complexity 1 Mod  OT Treatments  $Self Care/Home Management  23-37 mins  Written Expression  Dominant Hand Right   Michael Ware OT OT pager: [phone]

## 2019-11-05 NOTE — Progress Notes (Signed)
Sarmiento brace placed today and xrays reviewed. We will be by tomorrow to check brace and make adjustments to optimize alignment. I have called and spoke to patient and daughter who just arrived from [place] and will see them mid morning tomorrow

## 2019-11-05 NOTE — Plan of Care (Signed)
°  Problem: Coping: °Goal: Level of anxiety will decrease °Outcome: Progressing °  °

## 2019-11-05 NOTE — Evaluation (Signed)
Physical Therapy Evaluation Patient Details Name: Michael Ware MRN: [id number] DOB: September 05, 1931 Today's Date: 11/05/2019   History of Present Illness  86 yo male admitted with R midshaft humerus fx after falling at home. Hx of bradycardia  Clinical Impression  On eval, pt required Max assist for mobility( Max assist for bed mobility and Min/Mod assist to stand and take a few steps). He was able to stand and take a few side steps along the side of the bed. Pt presents with general weakness, decreased activity tolerance, and impaired gait and balance. Prior to eval, pt was requesting repositioning in bed. Sling was malpositioned so readjusted it prior to mobilizing to EOB. Readjusted sling a 2nd time once sitting EOB (as best as I could and within pt's tolerance). Pt is currently awaiting outside vendor to bring in fracture brace and fit it to him. Assisted pt back to bed at his request. He reported 10/10 pain at rest and with activity-made RN aware. Discussed d/c plan-pt stated his daughter is planning to come and stay with him. He prefers d/c home. Explained that PT recommendation at this time is for ST SNF. Encouraged him to discuss d/c plan with daughter. Will plan to follow and progress activity as tolerated.     Follow Up Recommendations SNF (HHPT and 24 hour supervision/assist if pt/family choose to return home instead)    Equipment Recommendations   (continuing to assess)    Recommendations for Other Services       Precautions / Restrictions Precautions Precautions: Fall;Shoulder Shoulder Interventions: Shoulder sling/immobilizer;At all times Required Braces or Orthoses: Other Brace Other Brace: awaiting Sarmiento fracture brace (not in room yet at time of eval) Restrictions Weight Bearing Restrictions: Yes RUE Weight Bearing: Non weight bearing      Mobility  Bed Mobility Overal bed mobility: Needs Assistance Bed Mobility: Supine to Sit;Sit to Supine     Supine to sit: HOB  elevated;Max assist Sit to supine: HOB elevated;Max assist   General bed mobility comments: Max assist for trunk and bil LEs. Utilized bedpad for scooting, repositioning. Increased time. Cues for safety, technique. Readjusted sling prior to bed mobility and once again while sitting EOB (as best as I coould and as much as pt could tolerate)  Transfers Overall transfer level: Needs assistance Equipment used: Rolling walker (2 wheeled) Transfers: Sit to Stand Transfer   Sit to Stand transfer: Mod assist;From elevated surface       General transfer comment: Stood with pt using 1 handle of RW for support. Cues for safety, technique, hand placemement. Unsteady.  Ambulation/Gait Ambulation/Gait assistance: Min assist   Assistive device: Rolling walker (2 wheeled)       General Gait Details: side steps along side of bed. Assist to stabilize pt and manage RW.  Stairs            Wheelchair Mobility    Modified Rankin (Stroke Patients Only)       Balance Overall balance assessment: Needs assistance;History of Falls   Sitting balance-Leahy Scale: Fair Sitting balance - Comments: Sat EOB with Min guard assist.   Standing balance support: Single extremity supported Standing balance-Leahy Scale: Poor                               Pertinent Vitals/Pain Pain Assessment: 0-10 Pain Score: 10-Worst pain ever Pain Location: 10/10 at rest before eval; 10/10 at end of session Pain Intervention(s): Repositioned;Premedicated before session (made RN  aware that pain was still elevated)    Home Living Family/patient expects to be discharged to:: Private residence Living Arrangements: Alone Available Help at Discharge: Family (per pt, daughter coming to stay with him) Type of Home: House       Home Layout: Bed/bath upstairs Home Equipment: None      Prior Function Level of Independence: Independent               Hand Dominance        Extremity/Trunk  Assessment   Upper Extremity Assessment Upper Extremity Assessment: RUE deficits/detail RUE Deficits / Details: R UE in sling. Awaiting specialty fx brace from outside vendor    Lower Extremity Assessment Lower Extremity Assessment: Generalized weakness    Cervical / Trunk Assessment Cervical / Trunk Assessment: Normal  Communication   Communication: No difficulties  Cognition Arousal/Alertness: Awake/alert Behavior During Therapy: WFL for tasks assessed/performed Overall Cognitive Status: Within Functional Limits for tasks assessed                                        General Comments      Exercises     Assessment/Plan    PT Assessment Patient needs continued PT services  PT Problem List Decreased strength;Decreased mobility;Decreased activity tolerance;Decreased balance;Decreased knowledge of precautions;Pain;Decreased knowledge of use of DME       PT Treatment Interventions DME instruction;Gait training;Therapeutic activities;Therapeutic exercise;Patient/family education;Balance training;Functional mobility training    PT Goals (Current goals can be found in the Care Plan section)  Acute Rehab PT Goals Patient Stated Goal: less pain. home. PT Goal Formulation: With patient Time For Goal Achievement: 11/18/19 Potential to Achieve Goals: Good    Frequency Min 3X/week   Barriers to discharge        Co-evaluation               AM-PAC PT "6 Clicks" Mobility  Outcome Measure Help needed turning from your back to your side while in a flat bed without using bedrails?: A Lot Help needed moving from lying on your back to sitting on the side of a flat bed without using bedrails?: A Lot Help needed moving to and from a bed to a chair (including a wheelchair)?: A Lot Help needed standing up from a chair using your arms (e.g., wheelchair or bedside chair)?: A Lot Help needed to walk in hospital room?: A Lot Help needed climbing 3-5 steps with a  railing? : A Lot 6 Click Score: 12    End of Session Equipment Utilized During Treatment: Gait belt (sling R UE) Activity Tolerance: Patient limited by pain Patient left: in bed;with call bell/phone within reach;with bed alarm set Nurse Communication: Patient requests pain meds PT Visit Diagnosis: Muscle weakness (generalized) (M62.81);Pain;History of falling (Z91.81);Unsteadiness on feet (R26.81) Pain - Right/Left: Right Pain - part of body: Shoulder    Time: 9381-8299 PT Time Calculation (min) (ACUTE ONLY): 37 min   Charges:   PT Evaluation $PT Eval Low Complexity: 1 Low PT Treatments $Gait Training: 8-22 mins           Doreatha Massed, PT Acute Rehabilitation  Office: [phone] Pager: [phone]

## 2019-11-05 NOTE — Progress Notes (Signed)
[facility] brace ordered from [facility]. Notified representative that patient has not been fitted.height and weight given and arm affected. Notified patient that brace has been ordered. Should be fitted today per clinic.

## 2019-11-06 ENCOUNTER — Inpatient Hospital Stay (HOSPITAL_COMMUNITY): Payer: Medicare Other

## 2019-11-06 LAB — COMPREHENSIVE METABOLIC PANEL
ALT: 45 U/L — ABNORMAL HIGH (ref 0–44)
AST: 58 U/L — ABNORMAL HIGH (ref 15–41)
Albumin: 3.1 g/dL — ABNORMAL LOW (ref 3.5–5.0)
Alkaline Phosphatase: 63 U/L (ref 38–126)
Anion gap: 7 (ref 5–15)
BUN: 16 mg/dL (ref 8–23)
CO2: 25 mmol/L (ref 22–32)
Calcium: 8.2 mg/dL — ABNORMAL LOW (ref 8.9–10.3)
Chloride: 102 mmol/L (ref 98–111)
Creatinine, Ser: 1.04 mg/dL (ref 0.61–1.24)
GFR calc Af Amer: >60 mL/min (ref >60)
GFR calc non Af Amer: >60 mL/min (ref >60)
Glucose, Bld: 106 mg/dL — ABNORMAL HIGH (ref 70–99)
Potassium: 4 mmol/L (ref 3.5–5.1)
Sodium: 134 mmol/L — ABNORMAL LOW (ref 135–145)
Total Bilirubin: 1.5 mg/dL — ABNORMAL HIGH (ref 0.3–1.2)
Total Protein: 5.6 g/dL — ABNORMAL LOW (ref 6.5–8.1)

## 2019-11-06 LAB — CBC
HCT: 41.4 % (ref 39.0–52.0)
Hemoglobin: 14.1 g/dL (ref 13.0–17.0)
MCH: 36.7 pg — ABNORMAL HIGH (ref 26.0–34.0)
MCHC: 34.1 g/dL (ref 30.0–36.0)
MCV: 107.8 fL — ABNORMAL HIGH (ref 80.0–100.0)
Platelets: 179 10*3/uL (ref 150–400)
RBC: 3.84 MIL/uL — ABNORMAL LOW (ref 4.22–5.81)
RDW: 12.2 % (ref 11.5–15.5)
WBC: 10.1 10*3/uL (ref 4.0–10.5)
nRBC: 0 % (ref 0.0–0.2)

## 2019-11-06 LAB — GLUCOSE, CAPILLARY
Glucose-Capillary: 108 mg/dL — ABNORMAL HIGH (ref 70–99)
Glucose-Capillary: 127 mg/dL — ABNORMAL HIGH (ref 70–99)

## 2019-11-06 LAB — MAGNESIUM: Magnesium: 2 mg/dL (ref 1.7–2.4)

## 2019-11-06 LAB — SURGICAL PCR SCREEN
MRSA, PCR: NEGATIVE
Staphylococcus aureus: NEGATIVE

## 2019-11-06 IMAGING — DX DG HUMERUS 2V *R*
3 series · 3 of 3 positions shown · non-contrast
Comparison: [DATE]

CLINICAL DATA: Fracture

EXAM:
RIGHT HUMERUS - 2+ VIEW

[humerus ap]
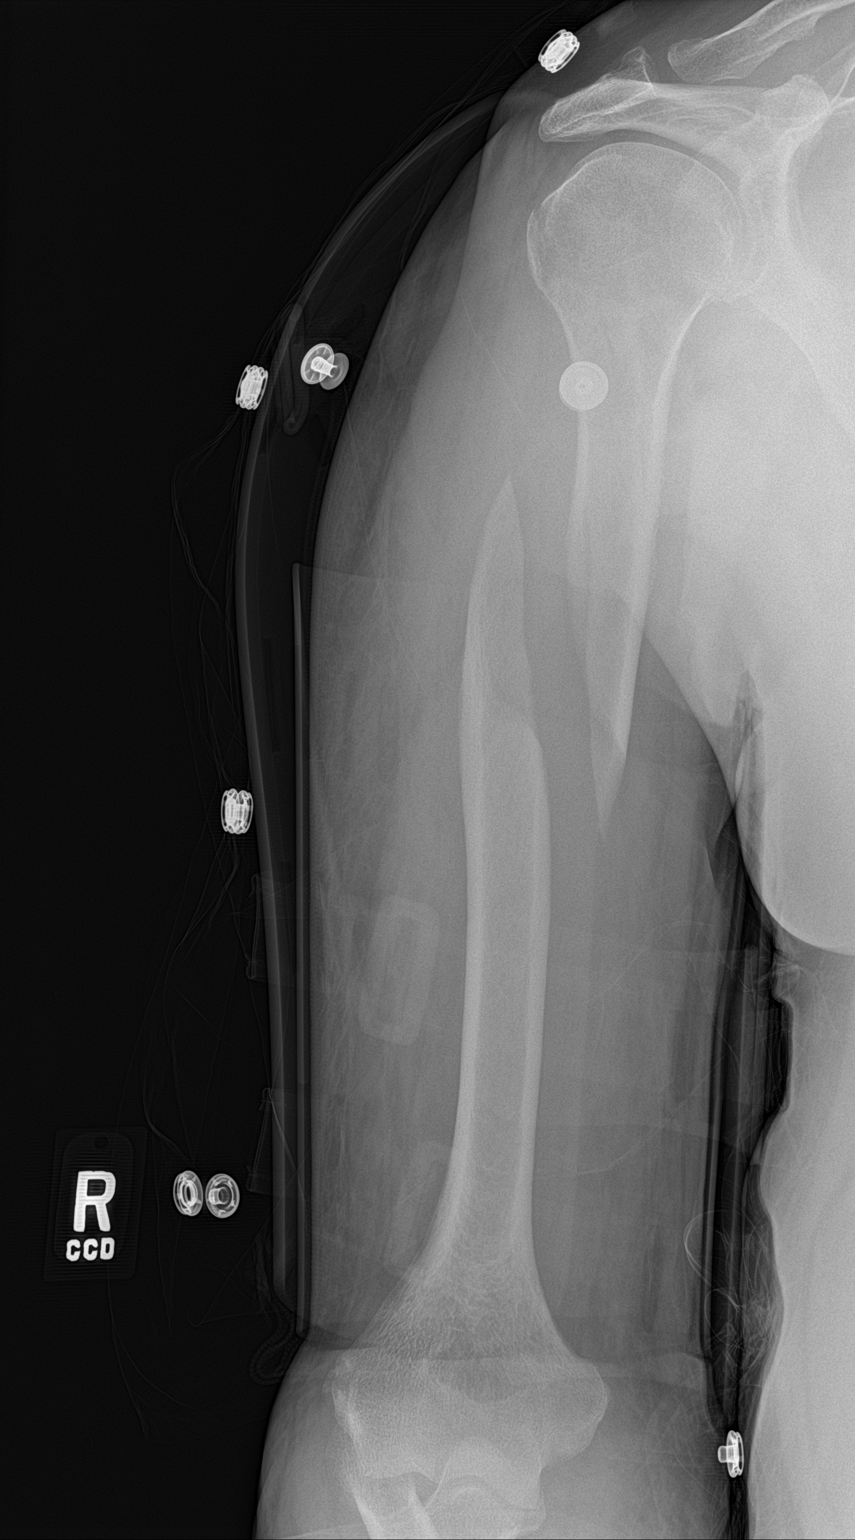

[humerus lat (1 of 2)]
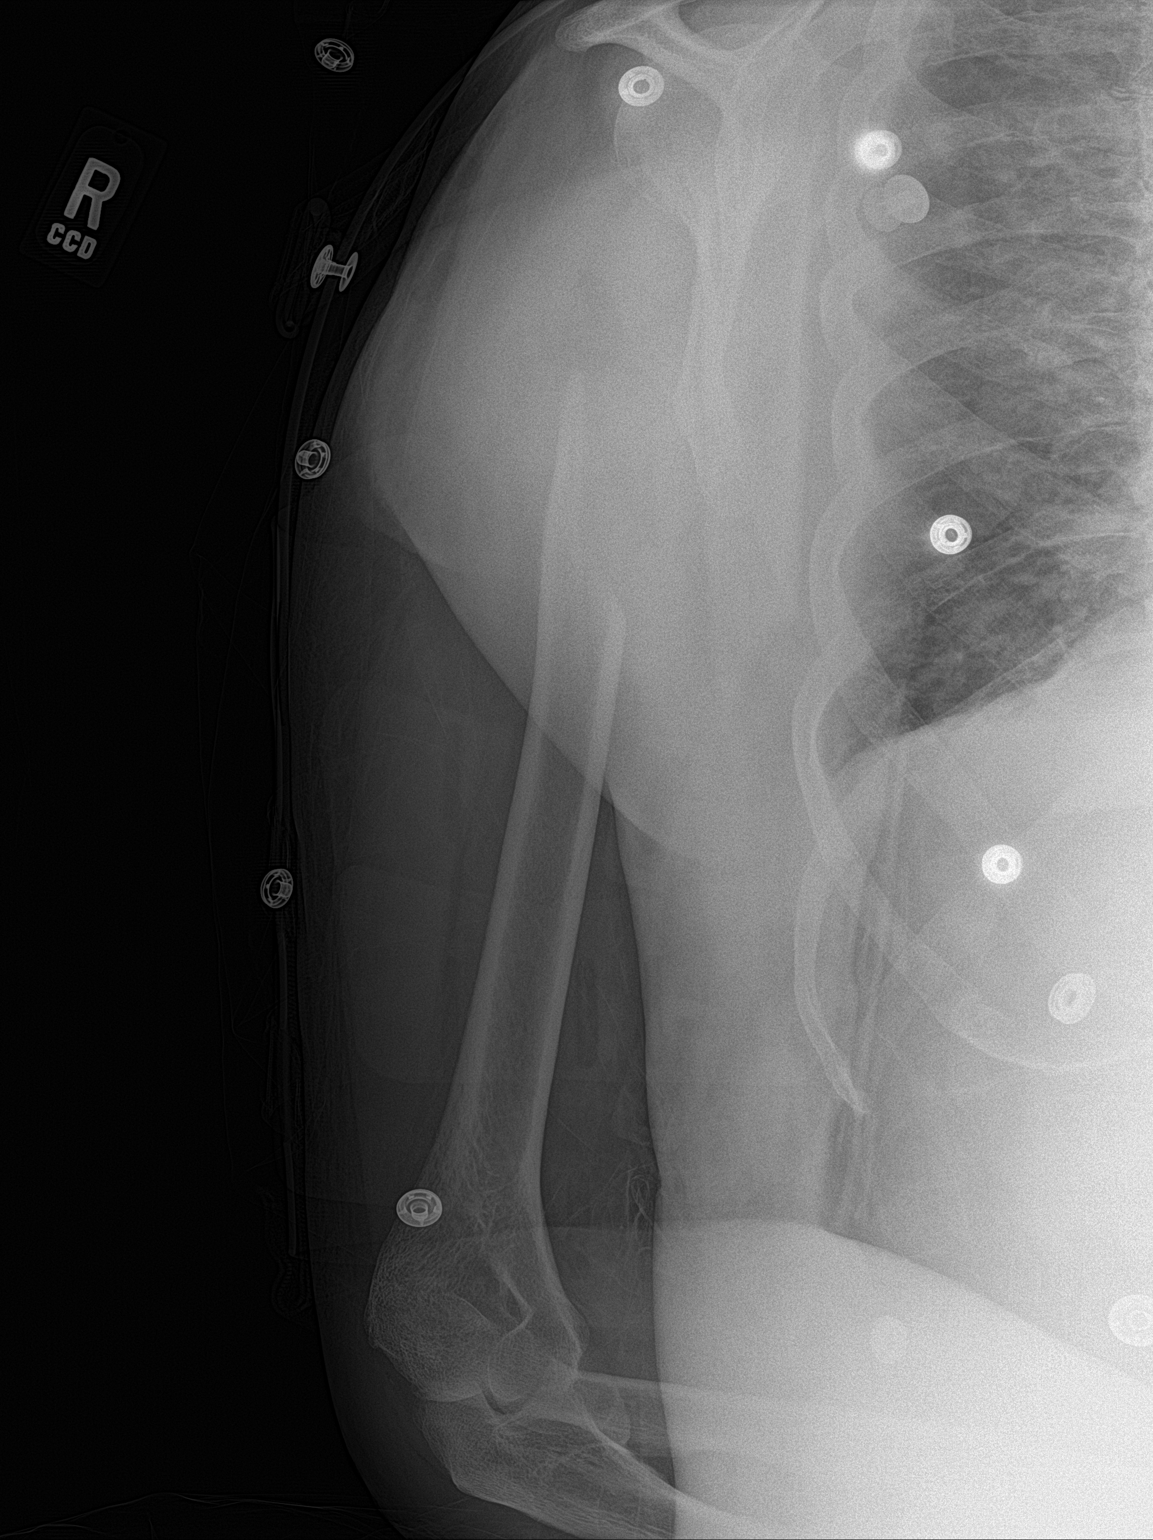

[humerus lat (2 of 2)]
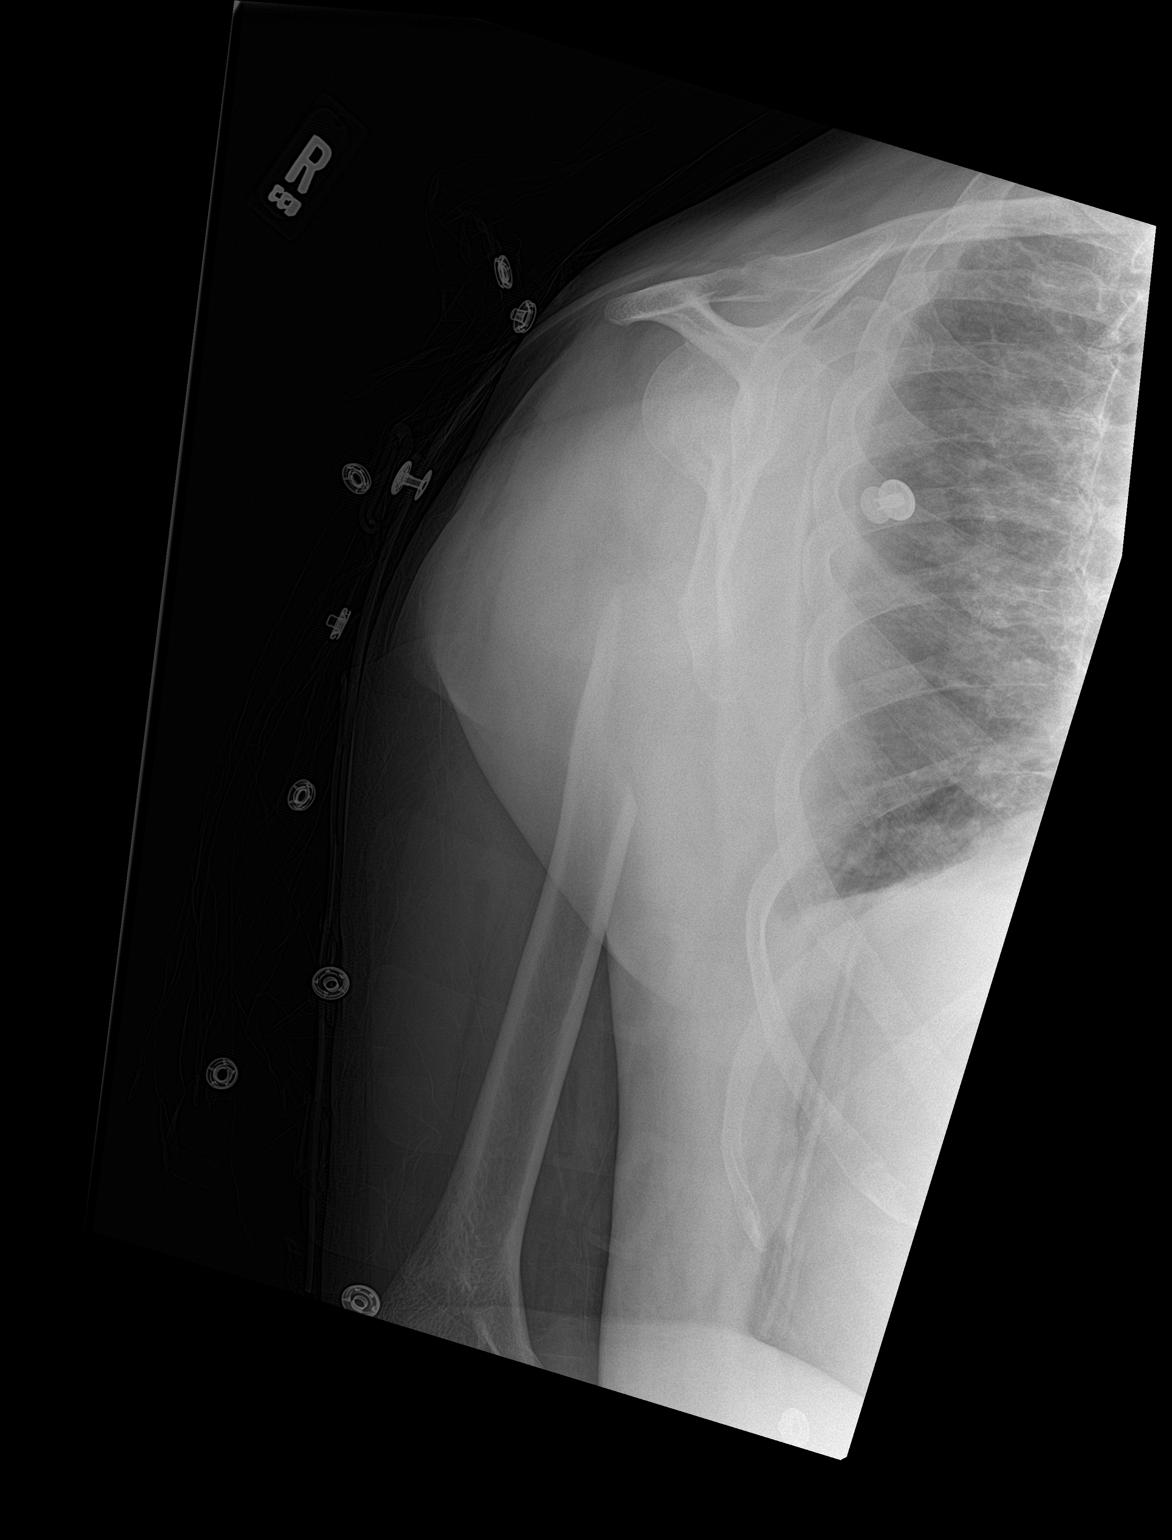

[3 of 3 positions shown; findings below may reference images not displayed]

FINDINGS: Patient was imaged in brace, upright and positioning. There is an
obliquely oriented fracture through the junction of the proximal and
mid thirds of the right humerus. On the frontal view, there is
cm of lateral displacement of the distal fracture fragment with
respect proximal fragment. On lateral view, there is 2.6 cm of
posterior displacement of the distal fracture fragment respect
proximal fragment. No dislocation evident. Joint spaces appear
unremarkable. No erosion.
IMPRESSION: Obliquely oriented fracture junction of proximal and mid thirds of
humerus with significant displacement of fracture fragments as
noted. No dislocation. No appreciable underlying arthropathy.

## 2019-11-06 MED ORDER — CEFAZOLIN SODIUM-DEXTROSE 2-4 GM/100ML-% IV SOLN
2.0000 g | INTRAVENOUS | Status: AC
  Administered 2019-11-07: 2 g via INTRAVENOUS
  Filled 2019-11-06: qty 100

## 2019-11-06 MED ORDER — FUROSEMIDE 10 MG/ML IJ SOLN: 20.0000 mg | Freq: Once | INTRAMUSCULAR | Status: DC

## 2019-11-06 MED ORDER — ACETAMINOPHEN 500 MG PO TABS
500.0000 mg | ORAL_TABLET | Freq: Three times a day (TID) | ORAL | Status: DC
  Administered 2019-11-06 – 2019-11-09 (×6): 500 mg via ORAL
  Filled 2019-11-06 (×6): qty 1

## 2019-11-06 NOTE — TOC Progression Note (Addendum)
Transition of Care [facility]) - Progression Note    Patient Details  Name: Michael Ware MRN: [id number] Date of Birth: 07-02-1931  Transition of [facility]) CM/SW Contact  Harumi Yamin, Juliann Pulse, RN Phone Number: 11/06/2019, 12:06 PM  Clinical Narrative: Bed offers given to patient/dtr in rm-await choice. Also provided info for [facility] will screen for home 1st program if they decline SNF.    12:43p-Dtr chose [facility]-left vm for rep to follow. Await d/c summary,& covid 24-48hrs of d/c.   Expected Discharge Plan: [place] Barriers to Discharge: Continued Medical Work up  Expected Discharge Plan and Services Expected Discharge Plan: [place] In-house Referral: Clinical Social Work Discharge Planning Services: CM Consult Post Acute Care Choice: [place] arrangements for the past 2 months: Single Family Home                                       Social Determinants of Health (SDOH) Interventions    Readmission Risk Interventions No flowsheet data found.

## 2019-11-06 NOTE — Progress Notes (Signed)
PROGRESS NOTE    Michael Ware  IHK:[id number] DOB: Jun 25, 1931 DOA: 11/04/2019 PCP: Alroy Dust, L.Marlou Sa, MD    Chief Complaint  Patient presents with  . Near Syncope  . Fall    Brief Narrative:  Patient is a 86 year old gentleman prior history of bradycardia, vitamin D deficiency, presented to the ED after mechanical fall.  Patient denies any head injury or loss of consciousness.  When EMS arrived at patient's home patient noted to have a near syncopal episode when they helped him off the floor.  Imaging showed a right humerus fracture.  EKG done was concerning for bradycardia.  Patient also noted to be hypotensive on admission.  Orthopedics and cardiology consulted and following.   Assessment & Plan:   Active Problems:   Humerus fracture   Bradycardia   Near syncope   Depression   Lactic acidosis   AKI (acute kidney injury) (HCC)   Hyperlipidemia   Hypotension  1 presyncope Likely secondary to hypotension versus vasovagal secondary to pain from humerus fracture.  Patient noted when EMS arrived to have a near syncopal episode.  Patient noted on admission presentation to be hypotensive and placed on IV fluids.  Repeat orthostatics done 11/05/2019 unremarkable.  2D echo with very technically difficult study with poor echo windows, EF of 70 to 75%, mild left ventricular hypertrophy, grade 1 diastolic dysfunction.  Patient seen in consultation by cardiology who are recommending outpatient event monitor.  Saline lock IV fluids.  Supportive care.  Follow.   2.  Right humerus fracture Secondary to mechanical fall.  Patient currently in fracture brace.  Patient seen in consultation by orthopedics who are recommending conservative treatment at this time, also take to fit patient with Sarmiento type humerus fracture brace which has been done, with repeat x-rays to follow-up on overall alignment.  Repeat x-rays pending.  Orthopedics recommending conservative treatment for about 6 to 8 weeks with  outpatient follow-up versus surgery pending repeat films of the humerus.  Patient still with complaints of pain in the right upper extremity.  Oxycodone has been increased to 10 mg every 4 hours as needed.  Continue IV morphine as needed.  Placed on scheduled Tylenol.  Per orthopedics.  Follow.  3.  Bradycardia Questionable etiology.  Patient noted to be bradycardic on admission.  2D echo done with a EF of 70 to 75%, no wall motion abnormalities, grade 1 diastolic dysfunction.  Patient noted to be hypotensive on admission which was felt likely could be from a vasovagal event.  Heart rate improved.  Patient seen in consultation by cardiology who are recommending 30-day event monitor to assess for symptomatic bradycardia arrhythmias with outpatient follow-up with Dr. Rayann Heman in 4 weeks.  Appreciate cardiology input and recommendations.  4.  Hypotension Likely secondary to hypovolemia as patient noted to be hypotensive on admission.  Blood pressure responded to IV fluids.  Saline lock IV fluids.  Try to minimize narcotics if able to.  Follow.    5.  Acute kidney injury Resolved with hydration.  Saline lock IV fluids.  Follow.   6.  Leukocytosis Likely reactive leukocytosis.  Chest x-ray with no acute infiltrate.  Urinalysis unremarkable.  Patient currently afebrile.  Leukocytosis has trended down.  No need for antibiotics.  7.  Depression Continue Prozac.  8.  Hyperlipidemia Statin.  9.  Lactic acidosis/HAGMA Likely secondary to dehydration/volume depletion in the setting of hypotension.  Urinalysis unremarkable.  Chest x-ray unremarkable.  Patient currently afebrile.  Lactic acidosis has resolved with hydration.  Anion gap has normalized.  Follow.   DVT prophylaxis: SCDs Code Status: DNR Family Communication: Updated patient and daughter at bedside. Disposition:   Status is: Inpatient    Dispo: The patient is from: Home              Anticipated d/c is to: SNF              Anticipated  d/c date is: 2 to 3 days.              Patient currently in fracture brace, repeat films pending, may need surgery.  Pain not controlled.  Not stable for discharge.        Consultants:   Orthopedics: Jenetta Loges, PA/Dr. Onnie Graham 11/04/2019  Cardiology: Dr. Radford Pax 11/04/2019  Procedures:   Plain films of the right humerus 11/05/2019, 11/04/2019  Chest x-ray 11/04/2019  Plain films of the left knee 11/04/2019  2D echo 11/04/2019  Antimicrobials:   None   Subjective: Patient sitting up in chair.  Still with significant complaints of pain in the right upper extremity.  Noted to have received IV morphine earlier on today.  Denies any chest pain or shortness of breath.  Daughter at bedside stated just so orthopedic surgeon who felt optimal therapy would be surgery however at this time patient somewhat hesitant and wants to be conservative.  Per daughter repeat x-rays have been ordered per orthopedic surgeon who is going to reassess.  Objective: Vitals:   11/05/19 0533 11/05/19 1326 11/05/19 2017 11/06/19 0545  BP: (!) 143/53 (!) 113/97 (!) 158/51 (!) 128/57  Pulse: 63 65 68 73  Resp: 20 18 20 20   Temp: 97.8 F (36.6 C) 99.4 F (37.4 C) 99.3 F (37.4 C) 98.6 F (37 C)  TempSrc:  Oral Oral Oral  SpO2: 90% 91% 91% 91%  Weight:      Height:        Intake/Output Summary (Last 24 hours) at 11/06/2019 1252 Last data filed at 11/06/2019 1008 Gross per 24 hour  Intake 1876.35 ml  Output 1100 ml  Net 776.35 ml   Filed Weights   11/04/19 0045  Weight: 91.2 kg    Examination:  General exam: NAD Respiratory system: CTAB anterior lung fields.  No wheezing, no crackles, normal respiratory effort.   Cardiovascular system: Regular rate rhythm no murmurs rubs or gallops.  No JVD.  No lower extremity edema.  Gastrointestinal system: Abdomen is soft, nontender, nondistended, positive bowel sounds.  No rebound.  No guarding.  Central nervous system: Alert and oriented. No focal  neurological deficits. Extremities: Fracture brace to right upper extremity.  Swelling right upper extremity. Skin: No rashes, lesions or ulcers Psychiatry: Judgement and insight appear normal. Mood & affect appropriate.     Data Reviewed: I have personally reviewed following labs and imaging studies  CBC: Recent Labs  Lab 11/04/19 0113 11/05/19 0453 11/06/19 0425  WBC 18.4* 9.2 10.1  NEUTROABS 14.0*  --   --   HGB 16.0 14.0 14.1  HCT 44.9 42.2 41.4  MCV 103.7* 109.0* 107.8*  PLT 273 189 270    Basic Metabolic Panel: Recent Labs  Lab 11/04/19 0113 11/05/19 0453 11/06/19 0425  NA 139 139 134*  K 3.7 3.9 4.0  CL 100 106 102  CO2 21* 26 25  GLUCOSE 135* 111* 106*  BUN 22 19 16   CREATININE 1.38* 1.07 1.04  CALCIUM 9.3 8.2* 8.2*  MG 2.5*  --  2.0    GFR: Estimated Creatinine Clearance: 56.9  mL/min (by C-G formula based on SCr of 1.04 mg/dL).  Liver Function Tests: Recent Labs  Lab 11/05/19 0453 11/06/19 0425  AST 52* 58*  ALT 36 45*  ALKPHOS 64 63  BILITOT 1.1 1.5*  PROT 5.4* 5.6*  ALBUMIN 3.1* 3.1*    CBG: Recent Labs  Lab 11/05/19 1130 11/05/19 1747 11/06/19 0021 11/06/19 0546  GLUCAP 165* 136* 127* 108*     Recent Results (from the past 240 hour(s))  Respiratory Panel by RT PCR (Flu A&B, Covid) - Nasopharyngeal Swab     Status: None   Collection Time: 11/04/19 12:49 AM   Specimen: Nasopharyngeal Swab  Result Value Ref Range Status   SARS Coronavirus 2 by RT PCR NEGATIVE NEGATIVE Final    Comment: (NOTE) SARS-CoV-2 target nucleic acids are NOT DETECTED.  The SARS-CoV-2 RNA is generally detectable in upper respiratoy specimens during the acute phase of infection. The lowest concentration of SARS-CoV-2 viral copies this assay can detect is 131 copies/mL. A negative result does not preclude SARS-Cov-2 infection and should not be used as the sole basis for treatment or other patient management decisions. A negative result may occur with    improper specimen collection/handling, submission of specimen other than nasopharyngeal swab, presence of viral mutation(s) within the areas targeted by this assay, and inadequate number of viral copies (<131 copies/mL). A negative result must be combined with clinical observations, patient history, and epidemiological information. The expected result is Negative.  Fact Sheet for Patients:  PinkCheek.be  Fact Sheet for Healthcare Providers:  GravelBags.it  This test is no t yet approved or cleared by the [place] FDA and  has been authorized for detection and/or diagnosis of SARS-CoV-2 by FDA under an Emergency Use Authorization (EUA). This EUA will remain  in effect (meaning this test can be used) for the duration of the COVID-19 declaration under Section 564(b)(1) of the Act, 21 U.S.C. section 360bbb-3(b)(1), unless the authorization is terminated or revoked sooner.     Influenza A by PCR NEGATIVE NEGATIVE Final   Influenza B by PCR NEGATIVE NEGATIVE Final    Comment: (NOTE) The Xpert Xpress SARS-CoV-2/FLU/RSV assay is intended as an aid in  the diagnosis of influenza from Nasopharyngeal swab specimens and  should not be used as a sole basis for treatment. Nasal washings and  aspirates are unacceptable for Xpert Xpress SARS-CoV-2/FLU/RSV  testing.  Fact Sheet for Patients: PinkCheek.be  Fact Sheet for Healthcare Providers: GravelBags.it  This test is not yet approved or cleared by the [place] FDA and  has been authorized for detection and/or diagnosis of SARS-CoV-2 by  FDA under an Emergency Use Authorization (EUA). This EUA will remain  in effect (meaning this test can be used) for the duration of the  Covid-19 declaration under Section 564(b)(1) of the Act, 21  U.S.C. section 360bbb-3(b)(1), unless the authorization is  terminated or  revoked. Performed at [facility], [place] [address] [place], [place] [address]   Blood culture (routine x 2)     Status: None (Preliminary result)   Collection Time: 11/04/19  1:57 AM   Specimen: BLOOD  Result Value Ref Range Status   Specimen Description   Final    BLOOD LEFT ANTECUBITAL Performed at [facility] [address] [place], [place] [address]    Special Requests   Final    BOTTLES DRAWN AEROBIC AND ANAEROBIC Blood Culture results may not be optimal due to an excessive volume of blood received in culture  bottles Performed at [facility] [address] [place], [place] [address]    Culture   Final    NO GROWTH 2 DAYS Performed at [facility] [address] [place], [place] [address]    Report Status PENDING  Incomplete  Blood culture (routine x 2)     Status: None (Preliminary result)   Collection Time: 11/04/19  9:36 AM   Specimen: BLOOD LEFT HAND  Result Value Ref Range Status   Specimen Description   Final    BLOOD LEFT HAND Performed at [facility] [address] [place], [place] [address]    Special Requests   Final    BOTTLES DRAWN AEROBIC AND ANAEROBIC Blood Culture results may not be optimal due to an inadequate volume of blood received in culture bottles Performed at [facility] [address], [place], [place] [address]    Culture   Final    NO GROWTH 2 DAYS Performed at [facility] [address] [place], [place] [address]    Report Status PENDING  Incomplete         Radiology Studies: DG Humerus Right  Result Date: 11/05/2019 CLINICAL DATA:  Recent fall with humeral fracture EXAM: RIGHT HUMERUS - 2+ VIEW COMPARISON:  Films from the previous day FINDINGS: Midshaft humeral fracture is again identified with significant displacement at the fracture site. No soft tissue abnormality is noted. IMPRESSION: Significant displacement at  the fracture site in the mid humerus. The overall appearance is similar to that seen on the previous day. Electronically Signed   By: Inez Catalina M.D.   On: 11/05/2019 13:13        Scheduled Meds: . acetaminophen  500 mg Oral TID  . calcium carbonate  625 mg Oral Q breakfast  . cholecalciferol  5,000 Units Oral Daily  . docusate sodium  100 mg Oral BID  . FLUoxetine  10 mg Oral Daily  . magnesium gluconate  500 mg Oral Daily  . pravastatin  40 mg Oral Daily  . zinc sulfate  220 mg Oral Daily   Continuous Infusions:    LOS: 2 days    Time spent: 35 minutes    Irine Seal, MD [facility]   To contact the attending provider between 7A-7P or the covering provider during after hours 7P-7A, please log into the web site [email or web address] and access using universal [place] password for that web site. If you do not have the password, please call the hospital operator.  11/06/2019, 12:52 PM

## 2019-11-06 NOTE — Progress Notes (Signed)
SATURATION QUALIFICATIONS: (This note is used to comply with regulatory documentation for home oxygen)  Patient Saturations on Room Air at Rest = 94 Patient Saturations on Room Air while Ambulating = 89-90%  Patient Saturations on 0 Liters of oxygen while Ambulating =89%  Please briefly explain why patient needs home oxygen:

## 2019-11-06 NOTE — Progress Notes (Signed)
Michael Ware  MRN: [id number] DOB/Age: [birth year] 84 y.o. Physician: Michael Ware, M.D.     Subjective: Patient resting comfortably in bed this afternoon.  Just finished lunch with good appetite.  Denies nausea.  Reports continued right arm pain and swelling. Vital Signs Temp:  [97.7 F (36.5 C)-99.3 F (37.4 C)] 97.7 F (36.5 C) (09/30 1412) Pulse Rate:  [68-73] 70 (09/30 1412) Resp:  [19-20] 19 (09/30 1412) BP: (128-158)/(51-57) 141/57 (09/30 1412) SpO2:  [91 %-92 %] 92 % (09/30 1412) Weight:  [97.5 kg] 97.5 kg (09/30 0822)  Lab Results Recent Labs    11/05/19 0453 11/06/19 0425  WBC 9.2 10.1  HGB 14.0 14.1  HCT 42.2 41.4  PLT 189 179   BMET Recent Labs    11/05/19 0453 11/06/19 0425  NA 139 134*  K 3.9 4.0  CL 106 102  CO2 26 25  GLUCOSE 111* 106*  BUN 19 16  CREATININE 1.07 1.04  CALCIUM 8.2* 8.2*   INR  Date Value Ref Range Status  01/19/2013 1.01 0.0 - 1.4 Final     Exam  Appropriate sized fracture brace has been applied to the right upper extremity.  He remains grossly neurovascular intact although there is significant swelling noted about the elbow and forearm.  Compartments are soft.  EPL, APB, and first dorsal interossei are 4 out of 5 in strength with moderate decreased secondary to pain as well as swelling.  Digital motion is limited secondary to swelling.  Repeat x-rays were performed in the brace in an upright position.  Unfortunately the current x-rays continue to show severe displacement at the fracture site with greater than 200% translation of the shaft.  Impression Severely displaced right midshaft humeral fracture with impacted and nondisplaced proximal humeral fracture.  Plan  I have counseled Michael Ware and his daughter regarding the most recent x-rays and the fact that there is persistent significant displacement of his humeral shaft fracture.  Given the degree of displacement I feel that there is a high risk of malunion/nonunion, and I do  not feel that the fracture brace is the optimal treatment for this particular fracture pattern.  My recommendation would be for open reduction and internal fixation.  We have reviewed the technical details as well as the potential risks versus benefits thereof which include but are not limited to bleeding, infection, neurovascular injury, persistence of pain, loss of motion, anesthetic complication, malunion, nonunion, loss of fixation, and possible need for additional surgery.  He understands, and accepts, and agrees with our planned procedure.  Surgery is tentatively scheduled for 11 AM tomorrow.   Michael Throne M  Ware 11/06/2019, 2:25 PM   Contact # [phone]

## 2019-11-06 NOTE — TOC Progression Note (Signed)
Transition of Care [facility]) - Progression Note    Patient Details  Name: Michael Ware MRN: [id number] Date of Birth: [birth year]  Transition of Care [facility]) CM/SW Contact  Remberto Lienhard, Juliann Pulse, RN Phone Number: 11/06/2019, 10:56 AM  Clinical Narrative:   1. 1.7 mi [place] at [place], [place] [address] [phone] Overall rating Below average 2. 2 mi [facility] at the [place], [place] [address] [phone] Overall rating Below average 3. 2.1 mi [place] at [place], [place] [address] [phone] Overall rating Much below average 4. 2.2 mi [place] [facility] [employer] [place], [place] [address] [phone] Overall rating Much above average 5. 2.2 mi [place], [place] [address] [phone] Overall rating Below average 6. 2.7 [place], [place] [address] [phone] Overall rating Much above average 7. 3.2 mi [place] [address] [place], [place] [address] [phone] Overall rating Below average 8. 3.3 [place] [address], [place] [address] [phone] Overall rating Below average 9. 3.5 mi [facility] [place], [place] [address] [phone] Overall rating Average 10. 4.3 [place], [place] [address] [phone] Overall rating Below average 11. 4.7 mi [place] at [place], [place] [address] [phone] Overall rating Much above average 12. 5.2 mi [facility] [address] [place], [place] [address] [phone] Overall rating Much  above average 13. 6.2 mi [place], [place] [address] [phone] Overall rating Average 14. 8.1 [place], [place] [address] [phone] Overall rating Above average 15. 8.1 mi [facility] and [place], [place] [address] [phone] Overall rating Much below average <Previous    Expected Discharge Plan: [place] Barriers to Discharge: Continued Medical Work up  Expected Discharge Plan and Services Expected Discharge Plan: [place] In-house Referral: Clinical Social Work Discharge Planning Services: CM Consult Post Acute Care Choice: [place] arrangements for the past 2 months: Single Family Home                                       Social Determinants of Health (SDOH) Interventions    Readmission Risk Interventions No flowsheet data found.

## 2019-11-06 NOTE — Progress Notes (Signed)
Physical Therapy Treatment Patient Details Name: Michael Ware MRN: [id number] DOB: 1931-04-09 Today's Date: 11/06/2019    History of Present Illness 86 yo male admitted with R midshaft humerus fx after falling at home. Hx of bradycardia    PT Comments    Pt very cooperative but pain limited and requiring increased time and significant assist for all tasks.   Follow Up Recommendations  SNF     Equipment Recommendations  None recommended by PT    Recommendations for Other Services       Precautions / Restrictions Precautions Precautions: Fall;Shoulder Shoulder Interventions: At all times;Shoulder sling/immobilizer (Sarmiento brace at all times; sling for comfort per dtr) Required Braces or Orthoses: Other Brace Other Brace:  Sarmiento fracture brace  Restrictions Weight Bearing Restrictions: Yes RUE Weight Bearing: Non weight bearing    Mobility  Bed Mobility Overal bed mobility: Needs Assistance Bed Mobility: Supine to Sit     Supine to sit: Mod assist;HOB elevated;+2 for safety/equipment     General bed mobility comments: cues for sequencing scooting, mod A to mobilize trunk and to scoot out to EOB using pad  Transfers Overall transfer level: Needs assistance Equipment used: Rolling walker (2 wheeled) Transfers: Sit to/from Stand Sit to Stand: Min assist;+2 physical assistance;From elevated surface         General transfer comment: instruct patient to push with L UE and use momentum to power up to standing from elevated bed height  Ambulation/Gait Ambulation/Gait assistance: Min assist;+2 physical assistance;+2 safety/equipment Gait Distance (Feet): 21 Feet Assistive device: Rolling walker (2 wheeled) Gait Pattern/deviations: Step-to pattern;Decreased step length - right;Decreased step length - left;Shuffle;Trunk flexed     General Gait Details: Use of L UE on RW with assist for stability and to control RW   Stairs             Wheelchair  Mobility    Modified Rankin (Stroke Patients Only)       Balance Overall balance assessment: Needs assistance;History of Falls Sitting-balance support: Feet supported Sitting balance-Leahy Scale: Fair Sitting balance - Comments: Sat EOB with supervision   Standing balance support: Single extremity supported Standing balance-Leahy Scale: Poor                              Cognition Arousal/Alertness: Awake/alert Behavior During Therapy: WFL for tasks assessed/performed Overall Cognitive Status: Within Functional Limits for tasks assessed                                        Exercises Other Exercises Other Exercises: instruct patient in fist pumps and ankle pumps    General Comments        Pertinent Vitals/Pain Pain Assessment: 0-10 Pain Score: 8  Pain Descriptors / Indicators: Aching;Grimacing;Guarding Pain Intervention(s): Limited activity within patient's tolerance;Monitored during session;Premedicated before session    Home Living                      Prior Function            PT Goals (current goals can now be found in the care plan section) Acute Rehab PT Goals Patient Stated Goal: go home PT Goal Formulation: With patient Time For Goal Achievement: 11/18/19 Potential to Achieve Goals: Good Progress towards PT goals: Progressing toward goals    Frequency    Min  3X/week      PT Plan Current plan remains appropriate    Co-evaluation              AM-PAC PT "6 Clicks" Mobility   Outcome Measure  Help needed turning from your back to your side while in a flat bed without using bedrails?: A Lot Help needed moving from lying on your back to sitting on the side of a flat bed without using bedrails?: A Lot Help needed moving to and from a bed to a chair (including a wheelchair)?: A Lot Help needed standing up from a chair using your arms (e.g., wheelchair or bedside chair)?: A Lot Help needed to walk in  hospital room?: A Lot Help needed climbing 3-5 steps with a railing? : Total 6 Click Score: 11    End of Session Equipment Utilized During Treatment: Gait belt Activity Tolerance: Patient limited by pain;Patient limited by fatigue Patient left: in chair;with call bell/phone within reach;with chair alarm set Nurse Communication: Mobility status PT Visit Diagnosis: Muscle weakness (generalized) (M62.81);Pain;History of falling (Z91.81);Unsteadiness on feet (R26.81) Pain - Right/Left: Right Pain - part of body: Shoulder     Time: 1137-1205 PT Time Calculation (min) (ACUTE ONLY): 28 min  Charges:  $Gait Training: 8-22 mins $Therapeutic Activity: 8-22 mins                     Debe Coder PT Acute Rehabilitation Services Pager [phone] Office [phone]    Yeudiel Mateo 11/06/2019, 12:51 PM

## 2019-11-07 ENCOUNTER — Encounter (HOSPITAL_COMMUNITY): Payer: Self-pay | Admitting: Internal Medicine

## 2019-11-07 ENCOUNTER — Encounter (HOSPITAL_COMMUNITY)
Admission: EM | Disposition: A | Discharge status: Skilled Nursing Facility | Payer: Self-pay | Source: Home / Self Care | Attending: Internal Medicine

## 2019-11-07 ENCOUNTER — Inpatient Hospital Stay (HOSPITAL_COMMUNITY): Payer: Medicare Other

## 2019-11-07 ENCOUNTER — Inpatient Hospital Stay (HOSPITAL_COMMUNITY): Payer: Medicare Other | Admitting: Anesthesiology

## 2019-11-07 ENCOUNTER — Other Ambulatory Visit: Payer: Self-pay

## 2019-11-07 DIAGNOSIS — F32A Depression, unspecified: Secondary | ICD-10-CM

## 2019-11-07 DIAGNOSIS — S42391A Other fracture of shaft of right humerus, initial encounter for closed fracture: Secondary | ICD-10-CM | POA: Diagnosis not present

## 2019-11-07 HISTORY — PX: ORIF HUMERUS FRACTURE: SHX2126

## 2019-11-07 LAB — BASIC METABOLIC PANEL
Anion gap: 10 (ref 5–15)
BUN: 17 mg/dL (ref 8–23)
CO2: 27 mmol/L (ref 22–32)
Calcium: 8.6 mg/dL — ABNORMAL LOW (ref 8.9–10.3)
Chloride: 99 mmol/L (ref 98–111)
Creatinine, Ser: 1.01 mg/dL (ref 0.61–1.24)
GFR calc Af Amer: >60 mL/min (ref >60)
GFR calc non Af Amer: >60 mL/min (ref >60)
Glucose, Bld: 121 mg/dL — ABNORMAL HIGH (ref 70–99)
Potassium: 4.2 mmol/L (ref 3.5–5.1)
Sodium: 136 mmol/L (ref 135–145)

## 2019-11-07 LAB — CBC WITH DIFFERENTIAL/PLATELET
Abs Immature Granulocytes: 0.04 10*3/uL (ref 0.00–0.07)
Basophils Absolute: 0.1 10*3/uL (ref 0.0–0.1)
Basophils Relative: 1 %
Eosinophils Absolute: 0.3 10*3/uL (ref 0.0–0.5)
Eosinophils Relative: 2 %
HCT: 41.7 % (ref 39.0–52.0)
Hemoglobin: 14 g/dL (ref 13.0–17.0)
Immature Granulocytes: 0 %
Lymphocytes Relative: 14 %
Lymphs Abs: 1.5 10*3/uL (ref 0.7–4.0)
MCH: 36.4 pg — ABNORMAL HIGH (ref 26.0–34.0)
MCHC: 33.6 g/dL (ref 30.0–36.0)
MCV: 108.3 fL — ABNORMAL HIGH (ref 80.0–100.0)
Monocytes Absolute: 1.5 10*3/uL — ABNORMAL HIGH (ref 0.1–1.0)
Monocytes Relative: 14 %
Neutro Abs: 7.3 10*3/uL (ref 1.7–7.7)
Neutrophils Relative %: 69 %
Platelets: 198 10*3/uL (ref 150–400)
RBC: 3.85 MIL/uL — ABNORMAL LOW (ref 4.22–5.81)
RDW: 12.2 % (ref 11.5–15.5)
WBC: 10.6 10*3/uL — ABNORMAL HIGH (ref 4.0–10.5)
nRBC: 0 % (ref 0.0–0.2)

## 2019-11-07 LAB — MAGNESIUM: Magnesium: 2 mg/dL (ref 1.7–2.4)

## 2019-11-07 IMAGING — RF DG HUMERUS 2V *R*
1 series · 3 of 3 positions shown · non-contrast
Comparison: [DATE]

CLINICAL DATA: Right humerus ORIF

EXAM:
RIGHT HUMERUS - 2+ VIEW

[Series 1: unknown protocol · 0.20mm/px · 3 of 3 slices shown]
[im 1/3]
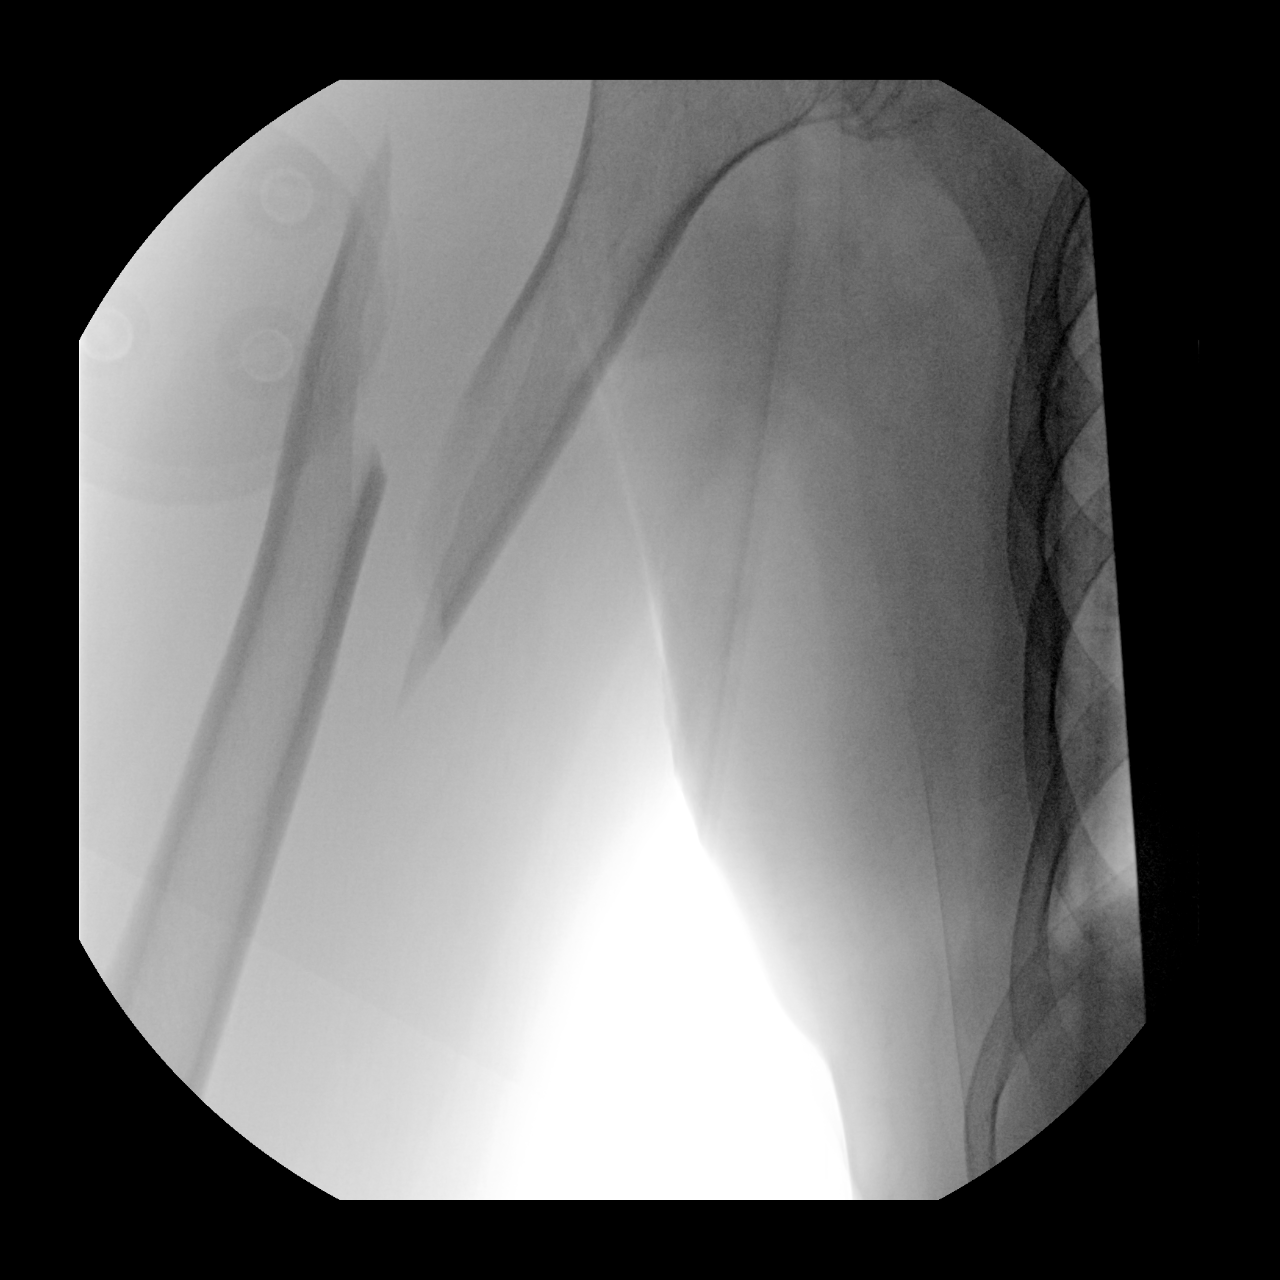
[im 2/3]
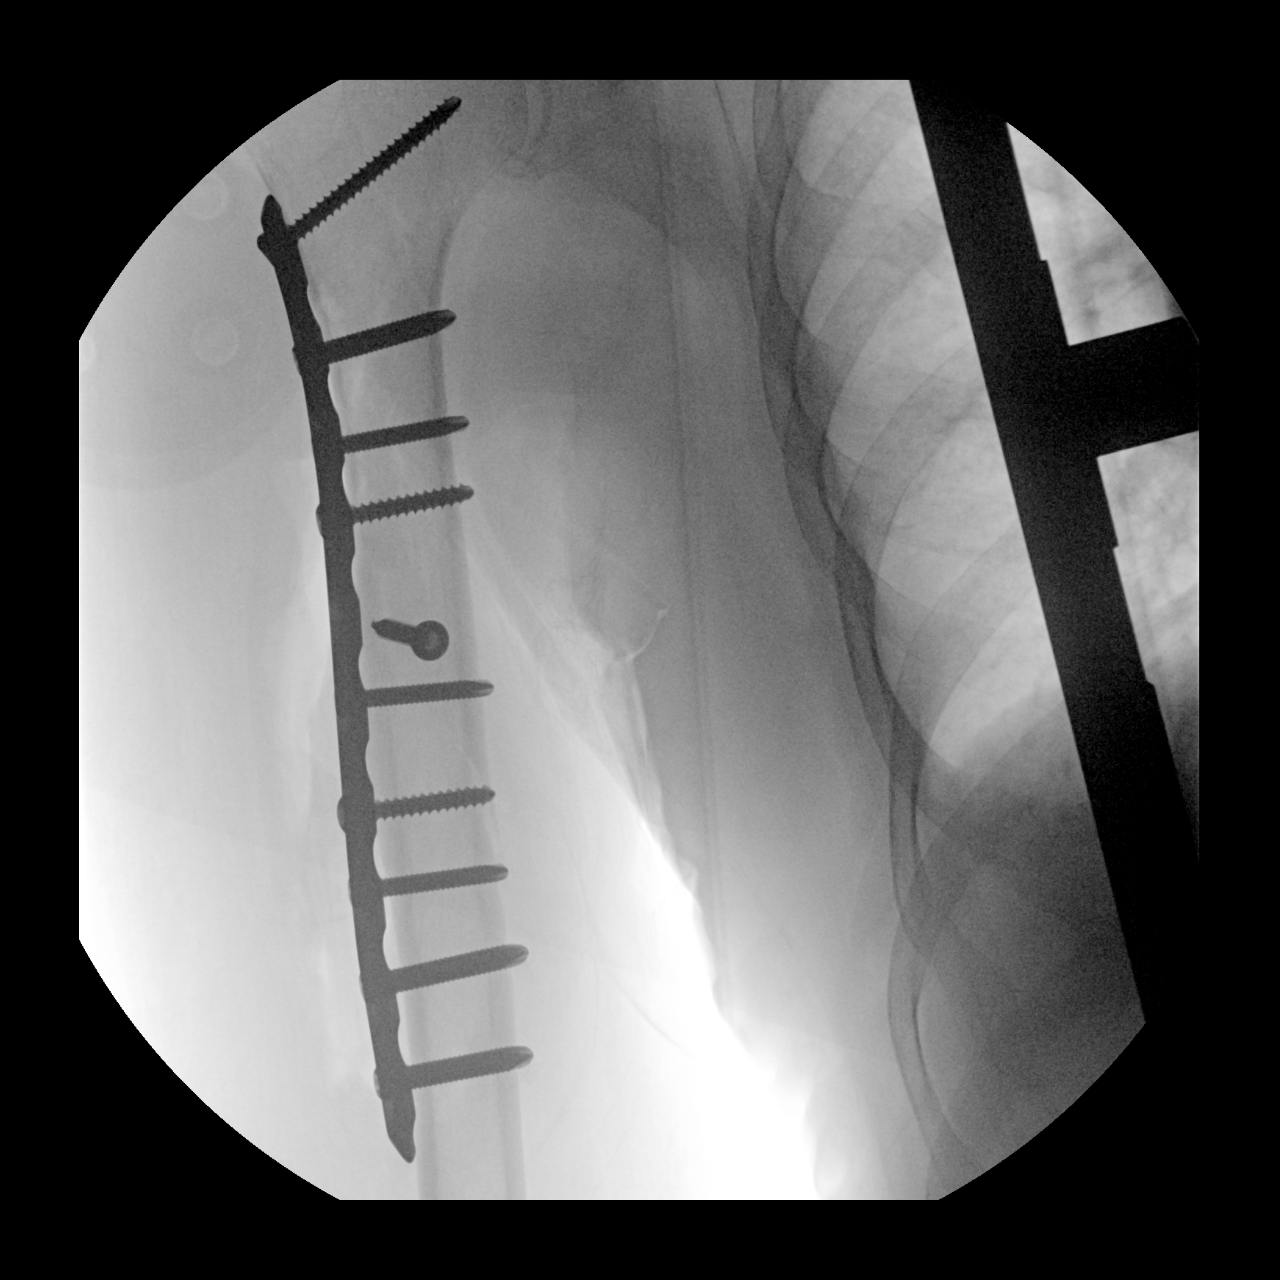
[im 3/3]
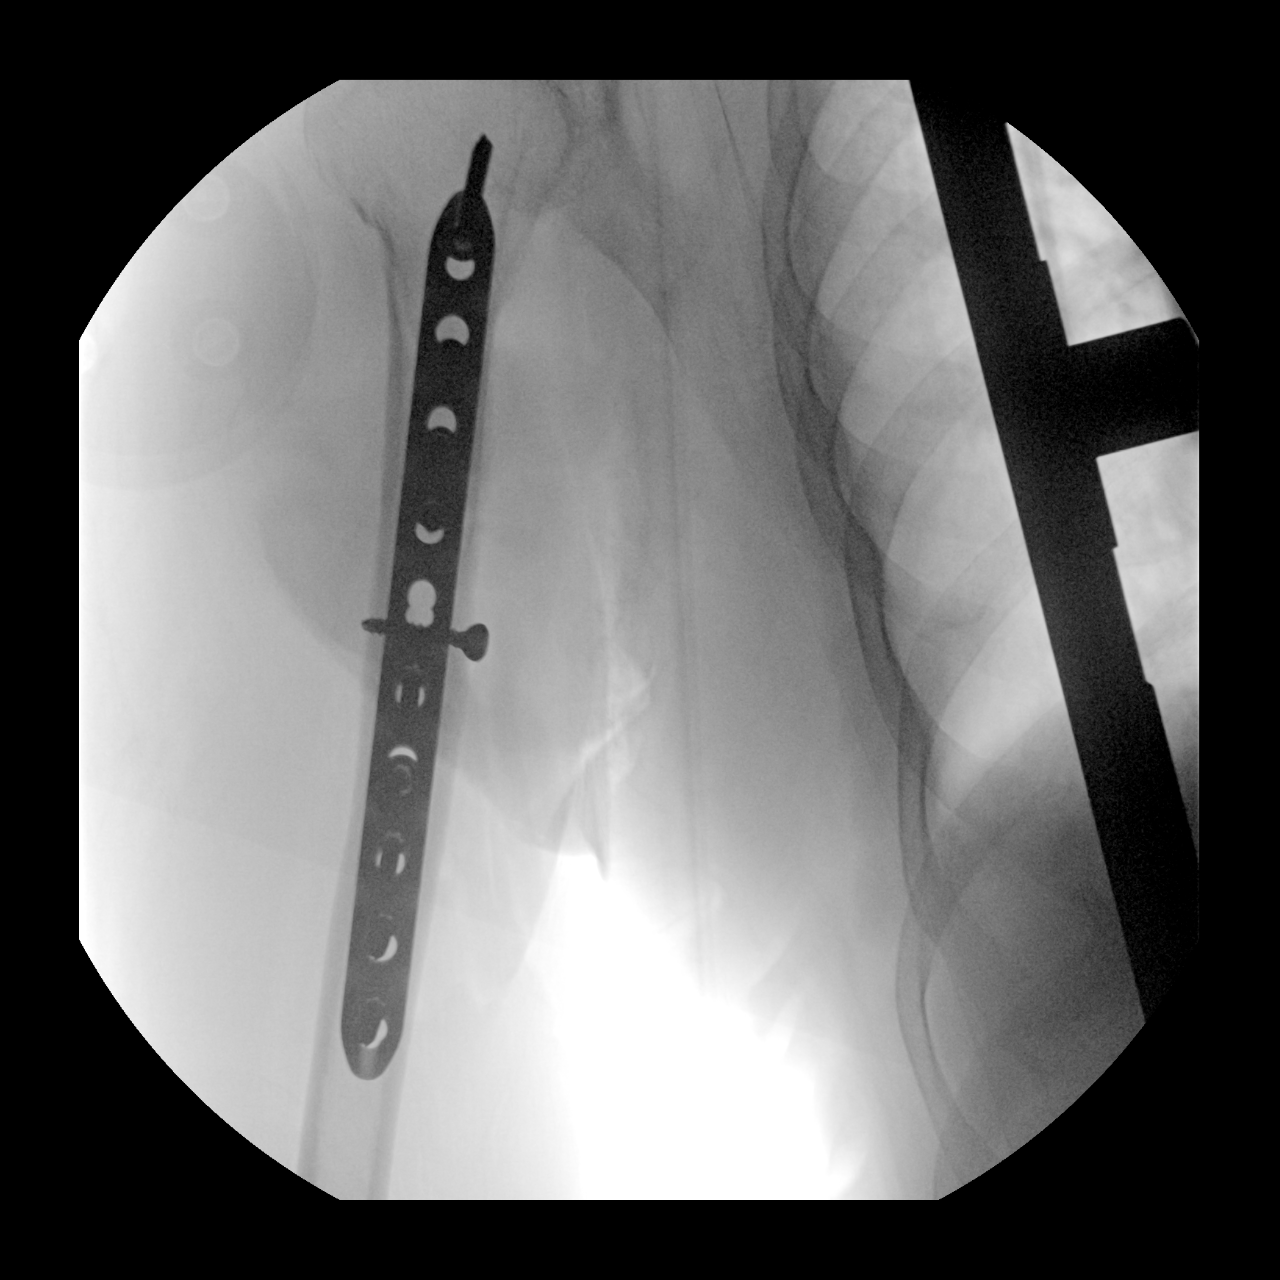

[3 of 3 positions shown; findings below may reference images not displayed]

FINDINGS: 3 C-arm fluoroscopic images were obtained intraoperatively and
submitted for post operative interpretation. Interval placement of
lateral sideplate and screw fixation construct traversing right
humeral diaphyseal fracture. Near anatomic alignment. 11.8 seconds
fluoroscopy time utilized. Please see the performing provider's
procedural report for further detail.
IMPRESSION: As above.

## 2019-11-07 SURGERY — OPEN REDUCTION INTERNAL FIXATION (ORIF) DISTAL HUMERUS FRACTURE
Anesthesia: Regional | Site: Arm Upper | Laterality: Right

## 2019-11-07 MED ORDER — PHENYLEPHRINE HCL (PRESSORS) 10 MG/ML IV SOLN
INTRAVENOUS | Status: AC
  Filled 2019-11-07: qty 1

## 2019-11-07 MED ORDER — ONDANSETRON HCL 4 MG PO TABS: 4.0000 mg | ORAL_TABLET | Freq: Four times a day (QID) | ORAL | Status: DC | PRN

## 2019-11-07 MED ORDER — DIPHENHYDRAMINE HCL 12.5 MG/5ML PO ELIX: 12.5000 mg | ORAL_SOLUTION | ORAL | Status: DC | PRN

## 2019-11-07 MED ORDER — STERILE WATER FOR IRRIGATION IR SOLN
Status: DC | PRN
  Administered 2019-11-07: 1000 mL

## 2019-11-07 MED ORDER — TRANEXAMIC ACID-NACL 1000-0.7 MG/100ML-% IV SOLN: 1000.0000 mg | INTRAVENOUS | Status: DC

## 2019-11-07 MED ORDER — PROPOFOL 10 MG/ML IV BOLUS
INTRAVENOUS | Status: AC
  Filled 2019-11-07: qty 20

## 2019-11-07 MED ORDER — BUPIVACAINE LIPOSOME 1.3 % IJ SUSP
INTRAMUSCULAR | Status: DC | PRN
  Administered 2019-11-07: 10 mL via PERINEURAL

## 2019-11-07 MED ORDER — MAGNESIUM CITRATE PO SOLN: 1.0000 | Freq: Once | ORAL | Status: DC | PRN

## 2019-11-07 MED ORDER — BUPIVACAINE HCL (PF) 0.5 % IJ SOLN
INTRAMUSCULAR | Status: DC | PRN
  Administered 2019-11-07: 15 mL via PERINEURAL

## 2019-11-07 MED ORDER — SODIUM CHLORIDE 0.9 % IR SOLN
Status: DC | PRN
  Administered 2019-11-07: 1000 mL

## 2019-11-07 MED ORDER — ONDANSETRON HCL 4 MG/2ML IJ SOLN: 4.0000 mg | Freq: Four times a day (QID) | INTRAMUSCULAR | Status: DC | PRN

## 2019-11-07 MED ORDER — METOCLOPRAMIDE HCL 5 MG/ML IJ SOLN: 5.0000 mg | Freq: Three times a day (TID) | INTRAMUSCULAR | Status: DC | PRN

## 2019-11-07 MED ORDER — TRANEXAMIC ACID-NACL 1000-0.7 MG/100ML-% IV SOLN
INTRAVENOUS | Status: DC | PRN
  Administered 2019-11-07: 1000 mg via INTRAVENOUS

## 2019-11-07 MED ORDER — SUGAMMADEX SODIUM 200 MG/2ML IV SOLN
INTRAVENOUS | Status: DC | PRN
  Administered 2019-11-07: 200 mg via INTRAVENOUS

## 2019-11-07 MED ORDER — ONDANSETRON HCL 4 MG/2ML IJ SOLN
INTRAMUSCULAR | Status: DC | PRN
  Administered 2019-11-07: 4 mg via INTRAVENOUS

## 2019-11-07 MED ORDER — ROCURONIUM BROMIDE 10 MG/ML (PF) SYRINGE
PREFILLED_SYRINGE | INTRAVENOUS | Status: AC
  Filled 2019-11-07: qty 10

## 2019-11-07 MED ORDER — ONDANSETRON HCL 4 MG/2ML IJ SOLN
INTRAMUSCULAR | Status: AC
  Filled 2019-11-07: qty 2

## 2019-11-07 MED ORDER — LIDOCAINE 2% (20 MG/ML) 5 ML SYRINGE
INTRAMUSCULAR | Status: AC
  Filled 2019-11-07: qty 10

## 2019-11-07 MED ORDER — FENTANYL CITRATE (PF) 100 MCG/2ML IJ SOLN
INTRAMUSCULAR | Status: DC | PRN
Start: 2019-11-07 — End: 2019-11-07
  Administered 2019-11-07 (×2): 50 ug via INTRAVENOUS

## 2019-11-07 MED ORDER — PHENYLEPHRINE HCL-NACL 10-0.9 MG/250ML-% IV SOLN
INTRAVENOUS | Status: DC | PRN
  Administered 2019-11-07: 50 ug/min via INTRAVENOUS

## 2019-11-07 MED ORDER — FENTANYL CITRATE (PF) 100 MCG/2ML IJ SOLN
INTRAMUSCULAR | Status: AC
  Administered 2019-11-07: 50 ug
  Filled 2019-11-07: qty 2

## 2019-11-07 MED ORDER — EPHEDRINE 5 MG/ML INJ
INTRAVENOUS | Status: AC
  Filled 2019-11-07: qty 10

## 2019-11-07 MED ORDER — BUPIVACAINE-EPINEPHRINE 0.5% -1:200000 IJ SOLN
INTRAMUSCULAR | Status: AC
  Filled 2019-11-07: qty 1

## 2019-11-07 MED ORDER — MENTHOL 3 MG MT LOZG: 1.0000 | LOZENGE | OROMUCOSAL | Status: DC | PRN

## 2019-11-07 MED ORDER — DOCUSATE SODIUM 100 MG PO CAPS: 100.0000 mg | ORAL_CAPSULE | Freq: Two times a day (BID) | ORAL | Status: DC

## 2019-11-07 MED ORDER — OXYCODONE HCL 5 MG/5ML PO SOLN: 5.0000 mg | Freq: Once | ORAL | Status: DC | PRN

## 2019-11-07 MED ORDER — EPHEDRINE SULFATE-NACL 50-0.9 MG/10ML-% IV SOSY
PREFILLED_SYRINGE | INTRAVENOUS | Status: DC | PRN
  Administered 2019-11-07: 10 mg via INTRAVENOUS

## 2019-11-07 MED ORDER — FENTANYL CITRATE (PF) 100 MCG/2ML IJ SOLN
INTRAMUSCULAR | Status: AC
  Filled 2019-11-07: qty 2

## 2019-11-07 MED ORDER — PHENYLEPHRINE 40 MCG/ML (10ML) SYRINGE FOR IV PUSH (FOR BLOOD PRESSURE SUPPORT)
PREFILLED_SYRINGE | INTRAVENOUS | Status: DC | PRN
  Administered 2019-11-07: 120 ug via INTRAVENOUS
  Administered 2019-11-07 (×2): 80 ug via INTRAVENOUS
  Administered 2019-11-07 (×2): 60 ug via INTRAVENOUS

## 2019-11-07 MED ORDER — LIDOCAINE 2% (20 MG/ML) 5 ML SYRINGE
INTRAMUSCULAR | Status: DC | PRN
  Administered 2019-11-07: 60 mg via INTRAVENOUS

## 2019-11-07 MED ORDER — DEXAMETHASONE SODIUM PHOSPHATE 10 MG/ML IJ SOLN
INTRAMUSCULAR | Status: AC
  Filled 2019-11-07: qty 1

## 2019-11-07 MED ORDER — OXYCODONE HCL 5 MG PO TABS: 5.0000 mg | ORAL_TABLET | Freq: Once | ORAL | Status: DC | PRN

## 2019-11-07 MED ORDER — METOCLOPRAMIDE HCL 5 MG PO TABS: 5.0000 mg | ORAL_TABLET | Freq: Three times a day (TID) | ORAL | Status: DC | PRN

## 2019-11-07 MED ORDER — ONDANSETRON HCL 4 MG/2ML IJ SOLN: 4.0000 mg | Freq: Once | INTRAMUSCULAR | Status: DC | PRN

## 2019-11-07 MED ORDER — ROCURONIUM BROMIDE 10 MG/ML (PF) SYRINGE
PREFILLED_SYRINGE | INTRAVENOUS | Status: DC | PRN
  Administered 2019-11-07: 10 mg via INTRAVENOUS
  Administered 2019-11-07: 50 mg via INTRAVENOUS

## 2019-11-07 MED ORDER — POLYETHYLENE GLYCOL 3350 17 G PO PACK
17.0000 g | PACK | Freq: Every day | ORAL | Status: DC | PRN
  Administered 2019-11-08: 17 g via ORAL
  Filled 2019-11-07: qty 1

## 2019-11-07 MED ORDER — LACTATED RINGERS IV SOLN: INTRAVENOUS | Status: DC | PRN

## 2019-11-07 MED ORDER — ENSURE PRE-SURGERY PO LIQD
296.0000 mL | Freq: Once | ORAL | Status: AC
  Administered 2019-11-07: 296 mL via ORAL
  Filled 2019-11-07: qty 296

## 2019-11-07 MED ORDER — DEXAMETHASONE SODIUM PHOSPHATE 10 MG/ML IJ SOLN
INTRAMUSCULAR | Status: DC | PRN
  Administered 2019-11-07: 10 mg via INTRAVENOUS

## 2019-11-07 MED ORDER — LACTATED RINGERS IV SOLN: INTRAVENOUS | Status: DC

## 2019-11-07 MED ORDER — TRANEXAMIC ACID-NACL 1000-0.7 MG/100ML-% IV SOLN
INTRAVENOUS | Status: AC
  Filled 2019-11-07: qty 100

## 2019-11-07 MED ORDER — PHENOL 1.4 % MT LIQD: 1.0000 | OROMUCOSAL | Status: DC | PRN

## 2019-11-07 MED ORDER — PHENYLEPHRINE 40 MCG/ML (10ML) SYRINGE FOR IV PUSH (FOR BLOOD PRESSURE SUPPORT)
PREFILLED_SYRINGE | INTRAVENOUS | Status: AC
  Filled 2019-11-07: qty 10

## 2019-11-07 MED ORDER — PROPOFOL 10 MG/ML IV BOLUS
INTRAVENOUS | Status: DC | PRN
  Administered 2019-11-07: 150 mg via INTRAVENOUS

## 2019-11-07 MED ORDER — BISACODYL 5 MG PO TBEC: 5.0000 mg | DELAYED_RELEASE_TABLET | Freq: Every day | ORAL | Status: DC | PRN

## 2019-11-07 MED ORDER — SODIUM CHLORIDE 0.9 % IV SOLN: INTRAVENOUS | Status: DC

## 2019-11-07 SURGICAL SUPPLY — 53 items
BAG ZIPLOCK 12X15 (MISCELLANEOUS) ×2 IMPLANT
BIT DRILL CANN QC 4.3X180 (BIT) ×1 IMPLANT
BIT DRILL Q COUPLING 4.5 (BIT) ×2 IMPLANT
BIT DRILL Q/COUPLING 1 (BIT) ×2 IMPLANT
BNDG ESMARK 4X9 LF (GAUZE/BANDAGES/DRESSINGS) ×2 IMPLANT
CHLORAPREP W/TINT 26 (MISCELLANEOUS) IMPLANT
COVER SURGICAL LIGHT HANDLE (MISCELLANEOUS) ×2 IMPLANT
COVER WAND RF STERILE (DRAPES) IMPLANT
DERMABOND ADVANCED (GAUZE/BANDAGES/DRESSINGS) ×1
DERMABOND ADVANCED .7 DNX12 (GAUZE/BANDAGES/DRESSINGS) ×1 IMPLANT
DRAPE 3/4 80X56 (DRAPES) ×4 IMPLANT
DRAPE C-ARM 42X120 X-RAY (DRAPES) ×2 IMPLANT
DRAPE C-ARMOR (DRAPES) ×2 IMPLANT
DRAPE ORTHO SPLIT 77X108 STRL (DRAPES) ×4
DRAPE SURG ORHT 6 SPLT 77X108 (DRAPES) ×2 IMPLANT
DRAPE U-SHAPE 47X51 STRL (DRAPES) ×2 IMPLANT
DRESSING AQUACEL AG SP 3.5X10 (GAUZE/BANDAGES/DRESSINGS) ×1 IMPLANT
DRILL BIT 4.3MM (BIT) ×2
DRSG AQUACEL AG ADV 3.5X10 (GAUZE/BANDAGES/DRESSINGS) ×2 IMPLANT
DRSG AQUACEL AG SP 3.5X10 (GAUZE/BANDAGES/DRESSINGS) ×2
DURAPREP 26ML APPLICATOR (WOUND CARE) ×2 IMPLANT
ELECT BLADE TIP CTD 4 INCH (ELECTRODE) ×2 IMPLANT
ELECT REM PT RETURN 15FT ADLT (MISCELLANEOUS) ×2 IMPLANT
GLOVE BIO SURGEON STRL SZ7.5 (GLOVE) ×2 IMPLANT
GLOVE BIO SURGEON STRL SZ8 (GLOVE) ×2 IMPLANT
GLOVE SS BIOGEL STRL SZ 7 (GLOVE) ×1 IMPLANT
GLOVE SS BIOGEL STRL SZ 7.5 (GLOVE) ×1 IMPLANT
GLOVE SUPERSENSE BIOGEL SZ 7 (GLOVE) ×1
GLOVE SUPERSENSE BIOGEL SZ 7.5 (GLOVE) ×1
KIT BASIN OR (CUSTOM PROCEDURE TRAY) ×2 IMPLANT
KIT TURNOVER KIT A (KITS) ×2 IMPLANT
MANIFOLD NEPTUNE II (INSTRUMENTS) ×2 IMPLANT
NS IRRIG 1000ML POUR BTL (IV SOLUTION) ×2 IMPLANT
PACK SHOULDER (CUSTOM PROCEDURE TRAY) ×2 IMPLANT
PLATE LOCKING 10 HOLE (Plate) ×2 IMPLANT
PROTECTOR NERVE ULNAR (MISCELLANEOUS) ×2 IMPLANT
SCREW CORTEX ST 4.5X26 (Screw) ×2 IMPLANT
SCREW CORTEX ST 4.5X30 (Screw) ×4 IMPLANT
SCREW CORTEX ST 4.5X48 (Screw) ×2 IMPLANT
SCREW LOCK ST T25 SD 5.0X30 (Screw) ×2 IMPLANT
SCREW LOCK ST T25 SD 5.0X32 (Screw) ×4 IMPLANT
SCREW LOCKING T25 4.0MM 30MM (Screw) ×6 IMPLANT
SLING ARM FOAM STRAP LRG (SOFTGOODS) ×2 IMPLANT
SLING ARM FOAM STRAP MED (SOFTGOODS) IMPLANT
SLING ARM FOAM STRAP XLG (SOFTGOODS) ×2 IMPLANT
SUCTION FRAZIER HANDLE 10FR (MISCELLANEOUS) ×2
SUCTION TUBE FRAZIER 10FR DISP (MISCELLANEOUS) ×1 IMPLANT
SUT MNCRL AB 3-0 PS2 18 (SUTURE) ×2 IMPLANT
SUT MON AB 2-0 CT1 36 (SUTURE) ×4 IMPLANT
SUT VIC AB 1 CT1 36 (SUTURE) ×2 IMPLANT
TOWEL OR 17X26 10 PK STRL BLUE (TOWEL DISPOSABLE) ×2 IMPLANT
TOWEL OR NON WOVEN STRL DISP B (DISPOSABLE) ×2 IMPLANT
WATER STERILE IRR 1000ML POUR (IV SOLUTION) ×2 IMPLANT

## 2019-11-07 NOTE — Anesthesia Procedure Notes (Signed)
Procedure Name: Intubation Date/Time: 11/07/2019 12:50 PM Performed by: Silas [place], CRNA Pre-anesthesia Checklist: Patient identified, Emergency Drugs available, Suction available and Patient being monitored Patient Re-evaluated:Patient Re-evaluated prior to induction Oxygen Delivery Method: Circle system utilized Preoxygenation: Pre-oxygenation with 100% oxygen Induction Type: IV induction Ventilation: Two handed mask ventilation required Laryngoscope Size: Mac and 4 Grade View: Grade I Tube type: Oral Tube size: 7.5 mm Number of attempts: 1 Airway Equipment and Method: Stylet and Oral airway Placement Confirmation: ETT inserted through vocal cords under direct vision,  positive ETCO2 and breath sounds checked- equal and bilateral Secured at: 22 cm Tube secured with: Tape Dental Injury: Teeth and Oropharynx as per pre-operative assessment

## 2019-11-07 NOTE — Progress Notes (Signed)
Michael Ware  MRN: [id number] DOB/Age: 04-08-1931 86 y.o. Physician: Ander Slade, M.D. Day of Surgery Procedure(s) (LRB): OPEN REDUCTION INTERNAL FIXATION (ORIF) DISTAL HUMERUS FRACTURE (Right)  Subjective: Patient is reevaluated this afternoon in the presurgical holding area.  We once again discussed the surgical plan for today Vital Signs Temp:  [97.7 F (36.5 C)-98.1 F (36.7 C)] 98.1 F (36.7 C) (10/01 1018) Pulse Rate:  [66-77] 70 (10/01 1128) Resp:  [10-43] 17 (10/01 1128) BP: (125-150)/(47-59) 125/51 (10/01 1123) SpO2:  [89 %-99 %] 89 % (10/01 1128) Weight:  [91.2 kg] 91.2 kg (10/01 1015)  Lab Results Recent Labs    11/06/19 0425 11/07/19 0441  WBC 10.1 10.6*  HGB 14.1 14.0  HCT 41.4 41.7  PLT 179 198   BMET Recent Labs    11/06/19 0425 11/07/19 0441  NA 134* 136  K 4.0 4.2  CL 102 99  CO2 25 27  GLUCOSE 106* 121*  BUN 16 17  CREATININE 1.04 1.01  CALCIUM 8.2* 8.6*   INR  Date Value Ref Range Status  01/19/2013 1.01 0.0 - 1.4 Final     Exam  Fracture brace applied to the right upper extremity.  Moderate swelling.  Remains neurovascular intact.  Plan I have once again reviewed with Michael Ware the surgical plan as well as the potential risks versus benefits of surgical intervention.  He understands, and accepts, and agrees with the planned procedure. Sargent Mankey M Argyle Gustafson 11/07/2019, 12:34 PM   Contact # [phone]

## 2019-11-07 NOTE — Transfer of Care (Signed)
Immediate Anesthesia Transfer of Care Note  Patient: Michael Ware  Procedure(s) Performed: OPEN REDUCTION INTERNAL FIXATION (ORIF) DISTAL HUMERUS FRACTURE (Right Arm Upper)  Patient Location: PACU  Anesthesia Type:GA combined with regional for post-op pain  Level of Consciousness: awake, patient cooperative and responds to stimulation  Airway & Oxygen Therapy: Patient Spontanous Breathing and Patient connected to face mask oxygen  Post-op Assessment: Report given to RN and Post -op Vital signs reviewed and stable  Post vital signs: Reviewed and stable  Last Vitals:  Vitals Value Taken Time  BP 146/56 11/07/19 1507  Temp    Pulse 70 11/07/19 1511  Resp 13 11/07/19 1511  SpO2 97 % 11/07/19 1511  Vitals shown include unvalidated device data.  Last Pain:  Vitals:   11/07/19 1123  TempSrc:   PainSc: Asleep      Patients Stated Pain Goal: 2 (42/39/53 2023)  Complications: No complications documented.

## 2019-11-07 NOTE — Anesthesia Postprocedure Evaluation (Signed)
Anesthesia Post Note  Patient: Michael Ware  Procedure(Ware) Performed: OPEN REDUCTION INTERNAL FIXATION (ORIF) DISTAL HUMERUS FRACTURE (Right Arm Upper)     Patient location during evaluation: PACU Anesthesia Type: General Level of consciousness: awake and alert Pain management: pain level controlled Vital Signs Assessment: post-procedure vital signs reviewed and stable Respiratory status: spontaneous breathing, nonlabored ventilation, respiratory function stable and patient connected to nasal cannula oxygen Cardiovascular status: blood pressure returned to baseline and stable Postop Assessment: no apparent nausea or vomiting Anesthetic complications: no   No complications documented.  Last Vitals:  Vitals:   11/07/19 1507 11/07/19 1515  BP: (!) 146/56 (!) 161/57  Pulse: 72 70  Resp: 18 12  Temp: 36.7 C   SpO2: (!) 88% 95%    Last Pain:  Vitals:   11/07/19 1507  TempSrc:   PainSc: Asleep                 Michael Ware

## 2019-11-07 NOTE — Progress Notes (Signed)
PROGRESS NOTE    Michael Ware  MWN:[id number] DOB: 1931-05-04 DOA: 11/04/2019 PCP: Alroy Dust, L.Marlou Sa, MD    Chief Complaint  Patient presents with  . Near Syncope  . Fall    Brief Narrative:  Patient is a 86 year old gentleman prior history of bradycardia, vitamin D deficiency, presented to the ED after mechanical fall.  Patient denies any head injury or loss of consciousness.  When EMS arrived at patient's home patient noted to have a near syncopal episode when they helped him off the floor.  Imaging showed a right humerus fracture.  EKG done was concerning for bradycardia.  Patient also noted to be hypotensive on admission.  Orthopedics and cardiology consulted and following.   Assessment & Plan:   Active Problems:   Humerus fracture   Bradycardia   Near syncope   Depression   Lactic acidosis   AKI (acute kidney injury) (HCC)   Hyperlipidemia   Hypotension  1 presyncope Likely secondary to hypotension versus vasovagal secondary to pain from humerus fracture.  Patient noted when EMS arrived to have a near syncopal episode.  Patient noted on admission presentation to be hypotensive and placed on IV fluids.  Repeat orthostatics done 11/05/2019 unremarkable.  2D echo with very technically difficult study with poor echo windows, EF of 70 to 75%, mild left ventricular hypertrophy, grade 1 diastolic dysfunction.  Patient seen in consultation by cardiology who are recommending outpatient event monitor.  IV fluids saline lock.  Patient for surgery today and as such we will place on gentle hydration.  Supportive care.  Follow.   2.  Right humerus fracture Secondary to mechanical fall.  Patient currently in fracture brace.  Patient seen in consultation by orthopedics who are recommending conservative treatment at this time, also take to fit patient with Sarmiento type humerus fracture brace which has been done, with repeat x-rays to follow-up on overall alignment.  Repeat x-rays done  11/06/2019 with severe displacement at the fracture site with greater than 200% translation of the shaft per orthopedics.  Orthopedics recommending surgery.  Patient for surgery this morning.  Continue current pain regimen of oxycodone 10 mg every 4 hours as needed, scheduled Tylenol, IV morphine as needed.  Appreciate orthopedics input and recommendations.   3.  Bradycardia Questionable etiology.  Patient noted to be bradycardic on admission.  2D echo done with a EF of 70 to 75%, no wall motion abnormalities, grade 1 diastolic dysfunction.  Patient noted to be hypotensive on admission which was felt likely could be from a vasovagal event.  Heart rate improved.  Patient seen in consultation by cardiology who are recommending 30-day event monitor to assess for symptomatic bradycardia arrhythmias with outpatient follow-up with Dr. Rayann Heman in 4 weeks.  Appreciate cardiology input and recommendations.  4.  Hypotension Likely secondary to hypovolemia as patient noted to be hypotensive on admission.  Blood pressure responded to IV fluids.  IV fluids have been saline locked.  Try to minimize narcotics.  Patient for surgery today will place on gentle hydration for the next 12 hours.   5.  Acute kidney injury Resolved with hydration.  Follow.    6.  Leukocytosis Likely reactive leukocytosis.  Chest x-ray with no acute infiltrate.  Urinalysis unremarkable.  Patient currently afebrile.  Leukocytosis has trended down.  No need for antibiotics.  7.  Depression Continue Prozac.  8.  Hyperlipidemia Continue statin.    9.  Lactic acidosis/HAGMA Likely secondary to dehydration/volume depletion in the setting of hypotension.  Urinalysis  unremarkable.  Chest x-ray unremarkable.  Patient currently afebrile.  Lactic acidosis has resolved with hydration.  Anion gap has normalized.  Follow.   DVT prophylaxis: SCDs Code Status: DNR Family Communication: Updated patient.  No family at bedside.  Disposition:    Status is: Inpatient    Dispo: The patient is from: Home              Anticipated d/c is to: SNF              Anticipated d/c date is: 3 days.                Patient currently in fracture brace, patient awaiting surgery to be done this morning per orthopedics.  Pain not controlled.  Not stable for discharge.        Consultants:   Orthopedics: Jenetta Loges, PA/Dr. Onnie Graham 11/04/2019  Cardiology: Dr. Radford Pax 11/04/2019  Procedures:   Plain films of the right humerus 11/05/2019, 11/04/2019, 11/06/2019  Chest x-ray 11/04/2019  Plain films of the left knee 11/04/2019  2D echo 11/04/2019  Antimicrobials:   None   Subjective: Patient laying in bed with complaints of right upper extremity pain.  Denies any chest pain or shortness of breath.  No abdominal pain.  Awaiting surgery this morning.    Objective: Vitals:   11/06/19 0822 11/06/19 1412 11/06/19 2117 11/07/19 0450  BP:  (!) 141/57 (!) 137/58 (!) 144/52  Pulse:  70 77 70  Resp:  19 18   Temp:  97.7 F (36.5 C) 98 F (36.7 C) 98.1 F (36.7 C)  TempSrc:  Oral Oral Oral  SpO2:  92% 90% 99%  Weight: 97.5 kg     Height:        Intake/Output Summary (Last 24 hours) at 11/07/2019 0957 Last data filed at 11/07/2019 0934 Gross per 24 hour  Intake 720 ml  Output 1500 ml  Net -780 ml   Filed Weights   11/04/19 0045 11/06/19 0822  Weight: 91.2 kg 97.5 kg    Examination:  General exam: NAD Respiratory system: Lungs clear to auscultation bilaterally.  No wheezes, no crackles, no rhonchi.  Normal respiratory effort.  Speaking in full sentences.  Cardiovascular system: RRR no murmurs rubs or gallops.  No JVD.  No lower extremity edema.  Gastrointestinal system: Abdomen is soft, nontender, nondistended, positive bowel sounds.  No rebound.  No guarding.  Central nervous system: Alert and oriented.  No focal neurological deficits.  Extremities: Fracture brace to right upper extremity.  Swelling right upper extremity. Skin:  No rashes, lesions or ulcers Psychiatry: Judgement and insight appear normal. Mood & affect appropriate.     Data Reviewed: I have personally reviewed following labs and imaging studies  CBC: Recent Labs  Lab 11/04/19 0113 11/05/19 0453 11/06/19 0425 11/07/19 0441  WBC 18.4* 9.2 10.1 10.6*  NEUTROABS 14.0*  --   --  7.3  HGB 16.0 14.0 14.1 14.0  HCT 44.9 42.2 41.4 41.7  MCV 103.7* 109.0* 107.8* 108.3*  PLT 273 189 179 235    Basic Metabolic Panel: Recent Labs  Lab 11/04/19 0113 11/05/19 0453 11/06/19 0425 11/07/19 0441  NA 139 139 134* 136  K 3.7 3.9 4.0 4.2  CL 100 106 102 99  CO2 21* 26 25 27   GLUCOSE 135* 111* 106* 121*  BUN 22 19 16 17   CREATININE 1.38* 1.07 1.04 1.01  CALCIUM 9.3 8.2* 8.2* 8.6*  MG 2.5*  --  2.0 2.0    GFR: Estimated  Creatinine Clearance: 60.5 mL/min (by C-G formula based on SCr of 1.01 mg/dL).  Liver Function Tests: Recent Labs  Lab 11/05/19 0453 11/06/19 0425  AST 52* 58*  ALT 36 45*  ALKPHOS 64 63  BILITOT 1.1 1.5*  PROT 5.4* 5.6*  ALBUMIN 3.1* 3.1*    CBG: Recent Labs  Lab 11/05/19 1130 11/05/19 1747 11/06/19 0021 11/06/19 0546  GLUCAP 165* 136* 127* 108*     Recent Results (from the past 240 hour(s))  Respiratory Panel by RT PCR (Flu A&B, Covid) - Nasopharyngeal Swab     Status: None   Collection Time: 11/04/19 12:49 AM   Specimen: Nasopharyngeal Swab  Result Value Ref Range Status   SARS Coronavirus 2 by RT PCR NEGATIVE NEGATIVE Final    Comment: (NOTE) SARS-CoV-2 target nucleic acids are NOT DETECTED.  The SARS-CoV-2 RNA is generally detectable in upper respiratoy specimens during the acute phase of infection. The lowest concentration of SARS-CoV-2 viral copies this assay can detect is 131 copies/mL. A negative result does not preclude SARS-Cov-2 infection and should not be used as the sole basis for treatment or other patient management decisions. A negative result may occur with  improper specimen  collection/handling, submission of specimen other than nasopharyngeal swab, presence of viral mutation(s) within the areas targeted by this assay, and inadequate number of viral copies (<131 copies/mL). A negative result must be combined with clinical observations, patient history, and epidemiological information. The expected result is Negative.  Fact Sheet for Patients:  PinkCheek.be  Fact Sheet for Healthcare Providers:  GravelBags.it  This test is no t yet approved or cleared by the [place] FDA and  has been authorized for detection and/or diagnosis of SARS-CoV-2 by FDA under an Emergency Use Authorization (EUA). This EUA will remain  in effect (meaning this test can be used) for the duration of the COVID-19 declaration under Section 564(b)(1) of the Act, 21 U.S.C. section 360bbb-3(b)(1), unless the authorization is terminated or revoked sooner.     Influenza A by PCR NEGATIVE NEGATIVE Final   Influenza B by PCR NEGATIVE NEGATIVE Final    Comment: (NOTE) The Xpert Xpress SARS-CoV-2/FLU/RSV assay is intended as an aid in  the diagnosis of influenza from Nasopharyngeal swab specimens and  should not be used as a sole basis for treatment. Nasal washings and  aspirates are unacceptable for Xpert Xpress SARS-CoV-2/FLU/RSV  testing.  Fact Sheet for Patients: PinkCheek.be  Fact Sheet for Healthcare Providers: GravelBags.it  This test is not yet approved or cleared by the [place] FDA and  has been authorized for detection and/or diagnosis of SARS-CoV-2 by  FDA under an Emergency Use Authorization (EUA). This EUA will remain  in effect (meaning this test can be used) for the duration of the  Covid-19 declaration under Section 564(b)(1) of the Act, 21  U.S.C. section 360bbb-3(b)(1), unless the authorization is  terminated or revoked. Performed at [facility], [place] [address], [place], [place] [address]   Blood culture (routine x 2)     Status: None (Preliminary result)   Collection Time: 11/04/19  1:57 AM   Specimen: BLOOD  Result Value Ref Range Status   Specimen Description   Final    BLOOD LEFT ANTECUBITAL Performed at [facility] [address], [place], [place] [address]    Special Requests   Final    BOTTLES DRAWN AEROBIC AND ANAEROBIC Blood Culture results may not be optimal due to an excessive volume of blood received  in culture bottles Performed at [facility], [place] [address], [place], [place] [address]    Culture   Final    NO GROWTH 3 DAYS Performed at [facility], [place] [address], [place], [place] [address]    Report Status PENDING  Incomplete  Blood culture (routine x 2)     Status: None (Preliminary result)   Collection Time: 11/04/19  9:36 AM   Specimen: BLOOD LEFT HAND  Result Value Ref Range Status   Specimen Description   Final    BLOOD LEFT HAND Performed at [facility] [address], [place], [place] [address]    Special Requests   Final    BOTTLES DRAWN AEROBIC AND ANAEROBIC Blood Culture results may not be optimal due to an inadequate volume of blood received in culture bottles Performed at [facility] [address], [place], [place] [address]    Culture   Final    NO GROWTH 3 DAYS Performed at [facility], [place] [address] [place], [place] [address]    Report Status PENDING  Incomplete  Surgical pcr screen     Status: None   Collection Time: 11/06/19  4:00 PM   Specimen: Nasal Mucosa; Nasal Swab  Result Value Ref Range Status   MRSA, PCR NEGATIVE NEGATIVE Final   Staphylococcus aureus NEGATIVE NEGATIVE Final    Comment: (NOTE) The Xpert SA Assay (FDA approved for NASAL specimens in patients 34 years of age and older), is one component of a comprehensive surveillance program. It is  not intended to diagnose infection nor to guide or monitor treatment. Performed at [facility], [place] [address] [place], [place] [address]          Radiology Studies: DG Humerus Right  Result Date: 11/06/2019 CLINICAL DATA:  Fracture EXAM: RIGHT HUMERUS - 2+ VIEW COMPARISON:  November 05, 2019 FINDINGS: Patient was imaged in brace, upright and positioning. There is an obliquely oriented fracture through the junction of the proximal and mid thirds of the right humerus. On the frontal view, there is 1.6 cm of lateral displacement of the distal fracture fragment with respect proximal fragment. On lateral view, there is 2.6 cm of posterior displacement of the distal fracture fragment respect proximal fragment. No dislocation evident. Joint spaces appear unremarkable. No erosion. IMPRESSION: Obliquely oriented fracture junction of proximal and mid thirds of humerus with significant displacement of fracture fragments as noted. No dislocation. No appreciable underlying arthropathy. Electronically Signed   By: Lowella Grip III M.D.   On: 11/06/2019 13:21   DG Humerus Right  Result Date: 11/05/2019 CLINICAL DATA:  Recent fall with humeral fracture EXAM: RIGHT HUMERUS - 2+ VIEW COMPARISON:  Films from the previous day FINDINGS: Midshaft humeral fracture is again identified with significant displacement at the fracture site. No soft tissue abnormality is noted. IMPRESSION: Significant displacement at the fracture site in the mid humerus. The overall appearance is similar to that seen on the previous day. Electronically Signed   By: Inez Catalina M.D.   On: 11/05/2019 13:13        Scheduled Meds: . acetaminophen  500 mg Oral TID  . calcium carbonate  625 mg Oral Q breakfast  . cholecalciferol  5,000 Units Oral Daily  . docusate sodium  100 mg Oral BID  . FLUoxetine  10 mg Oral Daily  . magnesium gluconate  500 mg Oral Daily  . pravastatin  40 mg Oral Daily  .  zinc  sulfate  220 mg Oral Daily   Continuous Infusions: .  ceFAZolin (ANCEF) IV       LOS: 3 days    Time spent: 35 minutes    Irine Seal, MD [facility]   To contact the attending provider between 7A-7P or the covering provider during after hours 7P-7A, please log into the web site [email or web address] and access using universal [place] password for that web site. If you do not have the password, please call the hospital operator.  11/07/2019, 9:57 AM

## 2019-11-07 NOTE — Anesthesia Procedure Notes (Signed)
Anesthesia Regional Block: Interscalene brachial plexus block   Pre-Anesthetic Checklist: ,, timeout performed, Correct Patient, Correct Site, Correct Laterality, Correct Procedure, Correct Position, site marked, Risks and benefits discussed,  Surgical consent,  Pre-op evaluation,  At surgeon's request and post-op pain management  Laterality: Right  Prep: Dura Prep       Needles:  Injection technique: Single-shot  Needle Type: Echogenic Stimulator Needle      Needle Gauge: 20     Additional Needles:   Procedures:,,,, ultrasound used (permanent image in chart),,,,  Narrative:  Start time: 11/07/2019 10:45 AM End time: 11/07/2019 10:50 AM Injection made incrementally with aspirations every 5 mL.  Performed by: Personally  Anesthesiologist: Darral Dash, DO  Additional Notes: Patient identified. Risks/Benefits/Options discussed with patient including but not limited to bleeding, infection, nerve damage, failed block, incomplete pain control. Patient expressed understanding and wished to proceed. All questions were answered. Sterile technique was used throughout the entire procedure. Please see nursing notes for vital signs. Aspirated in 5cc intervals with injection for negative confirmation. Patient was given instructions on fall risk and not to get out of bed. All questions and concerns addressed with instructions to call with any issues or inadequate analgesia.

## 2019-11-07 NOTE — Progress Notes (Addendum)
Assisted Dr Gloris [place] with right, ultrasound guided, interscalene  block. Side rails up, monitors on throughout procedure. See vital signs in flow sheet. Tolerated Procedure well.

## 2019-11-07 NOTE — Op Note (Signed)
11/04/2019 - 11/07/2019  2:33 PM  PATIENT:   Michael Ware  86 y.o. male  PRE-OPERATIVE DIAGNOSIS:  Displaced RIGHT HUMERUS SHAFT FRACTURE  POST-OPERATIVE DIAGNOSIS: Same  PROCEDURE: Open reduction and internal fixation of displaced right humeral shaft fracture  SURGEON:  Daniyal Tabor, Metta Clines M.D.  ASSISTANTS: Jenetta Loges, PA-C  ANESTHESIA:   General endotracheal and interscalene block with Exparel  EBL: 100 cc  SPECIMEN: None  Drains: None   PATIENT DISPOSITION:  PACU - hemodynamically stable.    PLAN OF CARE: Admit to inpatient   Brief history:  Michael Ware is an 86 year old gentleman who had a mechanical fall earlier this week sustaining a severely displaced long oblique midshaft right humerus fracture with associated nondisplaced proximal humeral fracture.  He underwent an initial course of conservative management with the placement of a fracture brace.  However his fracture remains severely displaced with angulation and appeared as though there were interval post soft tissue planes preventing appropriate reduction.  He is brought to the operating this time for planned open reduction and internal fixation.  Preoperatively I counseled Michael Ware regarding treatment options as well as the potential risks versus benefits thereof.  Possible surgical complications were reviewed including bleeding, infection, neurovascular injury, persistence of pain, loss of motion, anesthetic complication, malunion, nonunion, loss of fixation, possible need for additional surgery.  He understands, accepts, and agrees with our planned procedure.  Procedure in detail:  After undergoing routine preop evaluation the patient did receive prophylactic antibiotics and interscalene block with Exparel was established in the holding area by the anesthesia department.  Patient subsequently placed spine on the op table underwent smooth induction of a general endotracheal anesthesia.  The right shoulder girdle  right arm was then sterilely prepped and draped in standard fashion with the patient supine.  We did obtain preop fluoroscopic imaging to confirm that proper visualization could be obtained.  After prepping and draping timeout was called.  An anterior deltopectoral approach as well as anterolateral post of the humeral shaft was then performed through an approximately 20 cm incision.  Skin flaps were elevated dissection carried deeply and the interval between the pectoralis and deltoid proximally was developed and the deep fascia was divided distally such that the biceps were then retracted medially and the fracture had caused severe disruption of the underlying soft tissue planes with careful dissection performed in the brachialis had been essentially split along its midline as we would anticipate our typical dissection plane.  And postop tissue was removed.  We did identify the radial nerve posteriorly and took care to protect it.  A large amount of clot was then evacuated from the fracture site the fracture had significantly shortened.  We mobilized the soft tissue planes and performed a subperiosteal dissection exposing the anterolateral cortex of the humeral shaft from proximal to distal.  Once we gained control the fracture a reduction maneuver was performed in a tenaculum was then used to hold a provisional reduction.  Fluoroscopic imaging was obtained which showed good alignment of the fracture site.  We then contoured a 10 hole 4.5 Synthes plate to fit over the anterolateral cortex nicely traversing the long oblique fracture side and allowing 3 drill holes proximal and distal to the fracture site.  Contouring of the plate was required.  Once we achieved an appropriate fit we provisionally placed the plate with 2 standard screws with good fixation.  I then utilized a anterior to posterior lag screw to create compression at the fracture  site.  The balance of the screw holes in the plate were filled with  locking screws combination of 4.5 and 5.0 [place] which of achieved excellent fit.  The most proximal screw was a standard fully threaded 4.5 screw which was directed up into the humeral head obtaining good purchase.  Final fluoroscopic imaging showed good position of the hardware good alignment with the overall construct much to our satisfaction.  The wound was then copiously irrigated.  Final hemostasis was obtained.  The fascia overlying the brachialis was then repaired in a running fashion with a #1 Vicryl.  The deep fascia was repaired with a running #1 Vicryl 2-0 Vicryl used in the subcu layer and intracuticular 3 Monocryl for the skin followed by Dermabond and Aquacel dressing.  Right arm was placed in a sling.  The patient was awakened, extubated, and taken to department in stable addition.  Jenetta Loges, PA-C was utilized as an Environmental consultant throughout this case, essential for help with positioning the patient, positioning extremity, tissue manipulation, implantation of the prosthesis, suture management, wound closure, and intraoperative decision-making.  Michael Shutter MD   Contact # [phone]

## 2019-11-07 NOTE — Anesthesia Preprocedure Evaluation (Addendum)
Anesthesia Evaluation  Patient identified by MRN, date of birth, ID band Patient awake    Reviewed: Patient's Chart, lab work & pertinent test results  Airway Mallampati: I       Dental  (+) Edentulous Upper, Edentulous Lower   Pulmonary neg pulmonary ROS, former smoker,    Pulmonary exam normal        Cardiovascular hypertension, Pt. on medications Normal cardiovascular exam     Neuro/Psych Depression negative neurological ROS     GI/Hepatic Neg liver ROS, diverticulosis   Endo/Other  negative endocrine ROS  Renal/GU negative Renal ROS   BPH    Musculoskeletal Right humerus fracture   Abdominal (+) + obese,   Peds  Hematology negative hematology ROS (+)   Anesthesia Other Findings   Reproductive/Obstetrics                            Anesthesia Physical Anesthesia Plan  ASA: II  Anesthesia Plan: General and Regional   Post-op Pain Management: GA combined w/ Regional for post-op pain   Induction: Intravenous  PONV Risk Score and Plan: 2 and Ondansetron, Dexamethasone and Treatment may vary due to age or medical condition  Airway Management Planned: Mask and Oral ETT  Additional Equipment: None  Intra-op Plan:   Post-operative Plan: Extubation in OR  Informed Consent: I have reviewed the patients History and Physical, chart, labs and discussed the procedure including the risks, benefits and alternatives for the proposed anesthesia with the patient or authorized representative who has indicated his/her understanding and acceptance.     Dental advisory given  Plan Discussed with: CRNA and Surgeon  Anesthesia Plan Comments: (ECHO 09/21: 1. Very technically difficult study with poor echo windows, Definity contrast given 2. Left ventricular ejection fraction, by estimation, is 70 to 75%. The left ventricle has hyperdynamic function. The left ventricle has no regional wall  motion abnormalities. There is mild left ventricular hypertrophy. Left ventricular diastolic  parameters are consistent with Grade I diastolic dysfunction (impaired relaxation). 3. Right ventricular systolic function is normal. The right ventricular size is normal. 4. The mitral valve was not well visualized. No evidence of mitral valve regurgitation. 5. The aortic valve was not well visualized. Aortic valve regurgitation is not visualized.  Lab Results      Component                Value               Date                      WBC                      10.6 (H)            11/07/2019                HGB                      14.0                11/07/2019                HCT                      41.7                11/07/2019  MCV                      108.3 (H)           11/07/2019                PLT                      198                 11/07/2019           Covid-19 Nucleic Acid Test Results Lab Results      Component                Value               Date                      [id number]              NEGATIVE            11/04/2019          )        Anesthesia Quick Evaluation

## 2019-11-08 ENCOUNTER — Inpatient Hospital Stay (HOSPITAL_COMMUNITY): Payer: Medicare Other

## 2019-11-08 DIAGNOSIS — R0781 Pleurodynia: Secondary | ICD-10-CM

## 2019-11-08 DIAGNOSIS — M79605 Pain in left leg: Secondary | ICD-10-CM

## 2019-11-08 DIAGNOSIS — E860 Dehydration: Secondary | ICD-10-CM

## 2019-11-08 DIAGNOSIS — R0789 Other chest pain: Secondary | ICD-10-CM | POA: Clinically undetermined

## 2019-11-08 LAB — CBC WITH DIFFERENTIAL/PLATELET
Abs Immature Granulocytes: 0.05 10*3/uL (ref 0.00–0.07)
Basophils Absolute: 0 10*3/uL (ref 0.0–0.1)
Basophils Relative: 0 %
Eosinophils Absolute: 0 10*3/uL (ref 0.0–0.5)
Eosinophils Relative: 0 %
HCT: 40.9 % (ref 39.0–52.0)
Hemoglobin: 13.6 g/dL (ref 13.0–17.0)
Immature Granulocytes: 0 %
Lymphocytes Relative: 6 %
Lymphs Abs: 0.7 10*3/uL (ref 0.7–4.0)
MCH: 36.1 pg — ABNORMAL HIGH (ref 26.0–34.0)
MCHC: 33.3 g/dL (ref 30.0–36.0)
MCV: 108.5 fL — ABNORMAL HIGH (ref 80.0–100.0)
Monocytes Absolute: 1 10*3/uL (ref 0.1–1.0)
Monocytes Relative: 9 %
Neutro Abs: 9.4 10*3/uL — ABNORMAL HIGH (ref 1.7–7.7)
Neutrophils Relative %: 85 %
Platelets: 220 10*3/uL (ref 150–400)
RBC: 3.77 MIL/uL — ABNORMAL LOW (ref 4.22–5.81)
RDW: 12 % (ref 11.5–15.5)
WBC: 11.2 10*3/uL — ABNORMAL HIGH (ref 4.0–10.5)
nRBC: 0 % (ref 0.0–0.2)

## 2019-11-08 LAB — BASIC METABOLIC PANEL
Anion gap: 7 (ref 5–15)
BUN: 15 mg/dL (ref 8–23)
CO2: 28 mmol/L (ref 22–32)
Calcium: 8.4 mg/dL — ABNORMAL LOW (ref 8.9–10.3)
Chloride: 100 mmol/L (ref 98–111)
Creatinine, Ser: 0.74 mg/dL (ref 0.61–1.24)
GFR calc Af Amer: >60 mL/min (ref >60)
GFR calc non Af Amer: >60 mL/min (ref >60)
Glucose, Bld: 129 mg/dL — ABNORMAL HIGH (ref 70–99)
Potassium: 4.8 mmol/L (ref 3.5–5.1)
Sodium: 135 mmol/L (ref 135–145)

## 2019-11-08 IMAGING — DX DG RIBS W/ CHEST 3+V BILAT
7 series · 7 of 7 positions shown · non-contrast
Comparison: CT chest, [DATE]

CLINICAL DATA: Fall, rib pain

EXAM:
BILATERAL RIBS AND CHEST - 4+ VIEW

[rib pa obl (1 of 2)]
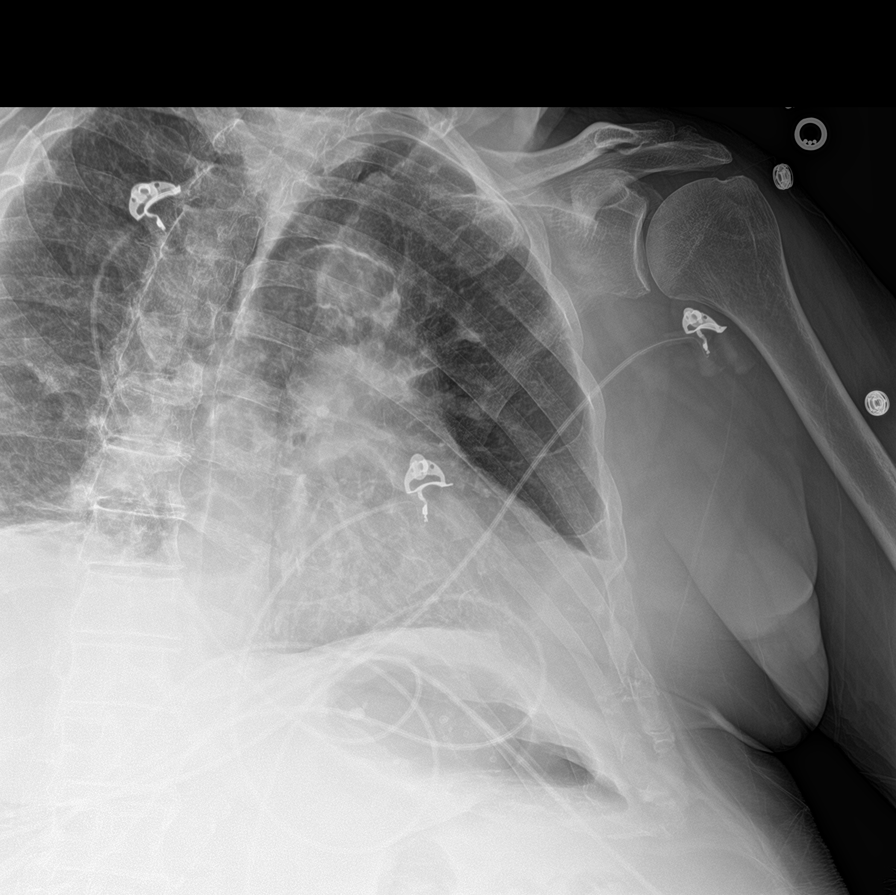

[rib pa obl (2 of 2)]
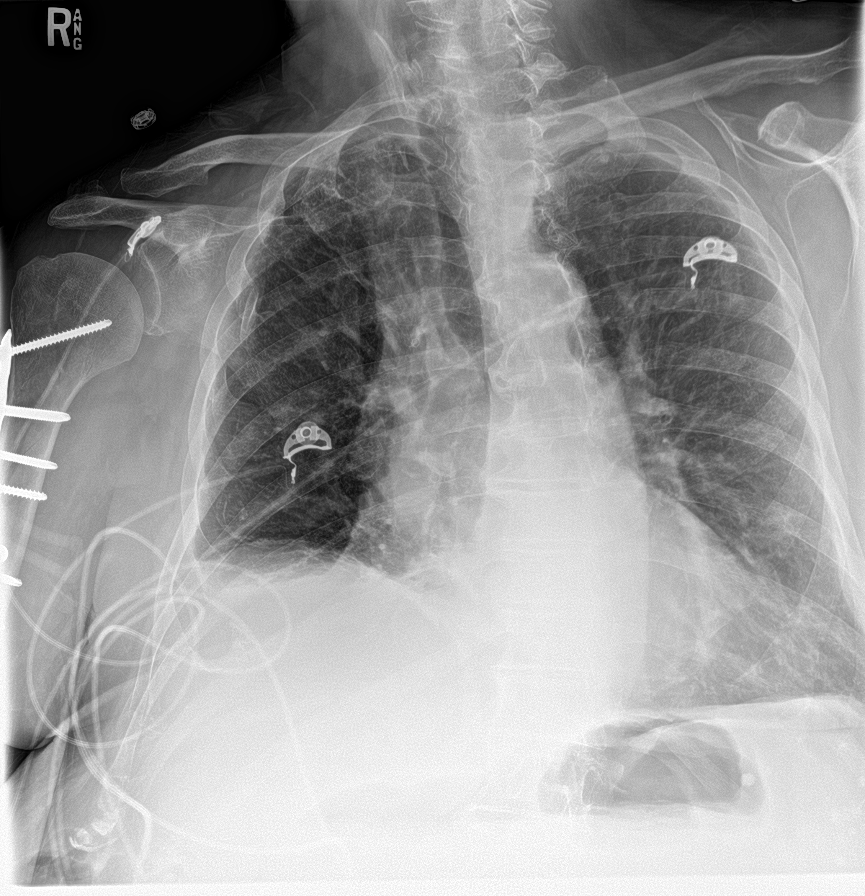

[rib ap (1 of 4)]
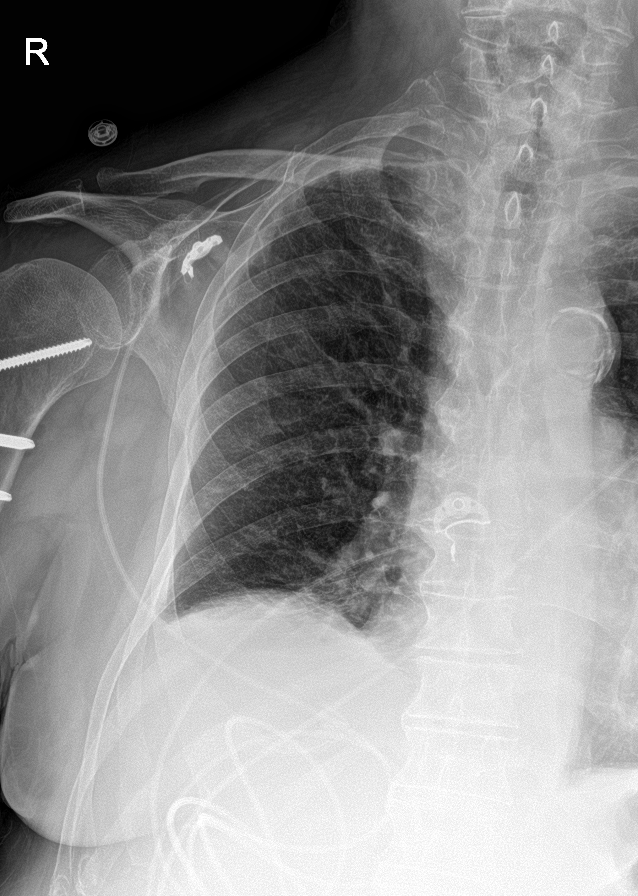

[rib ap (2 of 4)]
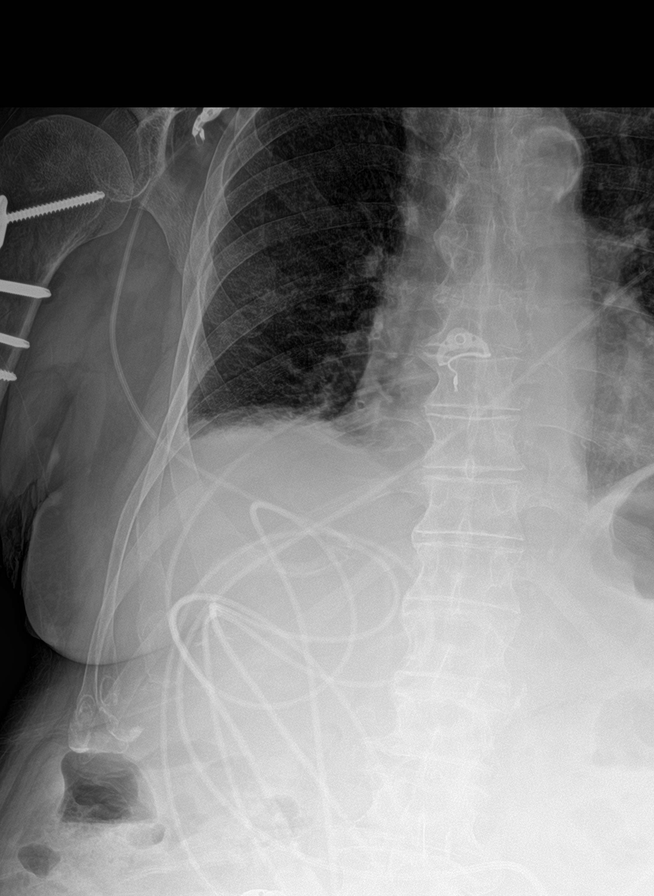

[chest ap]
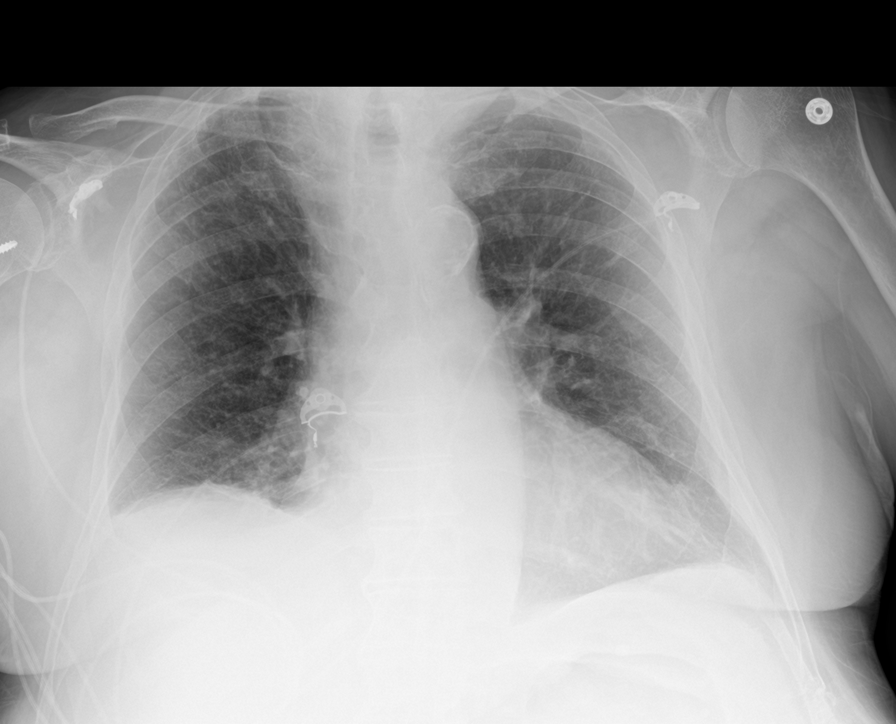

[rib ap (3 of 4)]
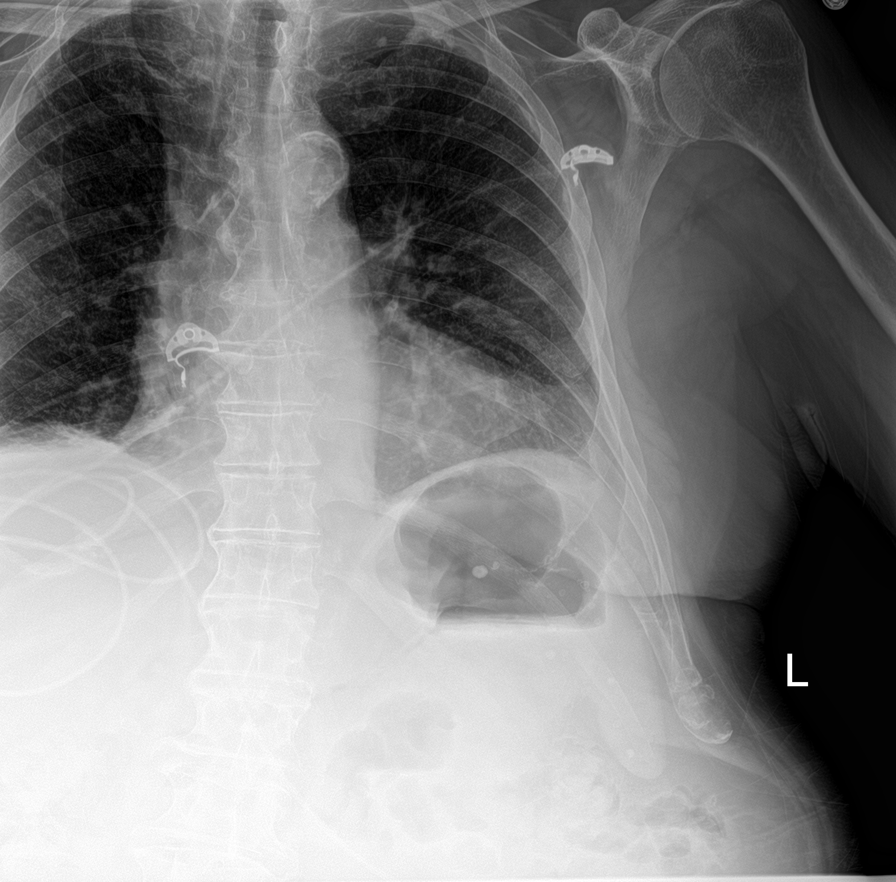

[rib ap (4 of 4)]
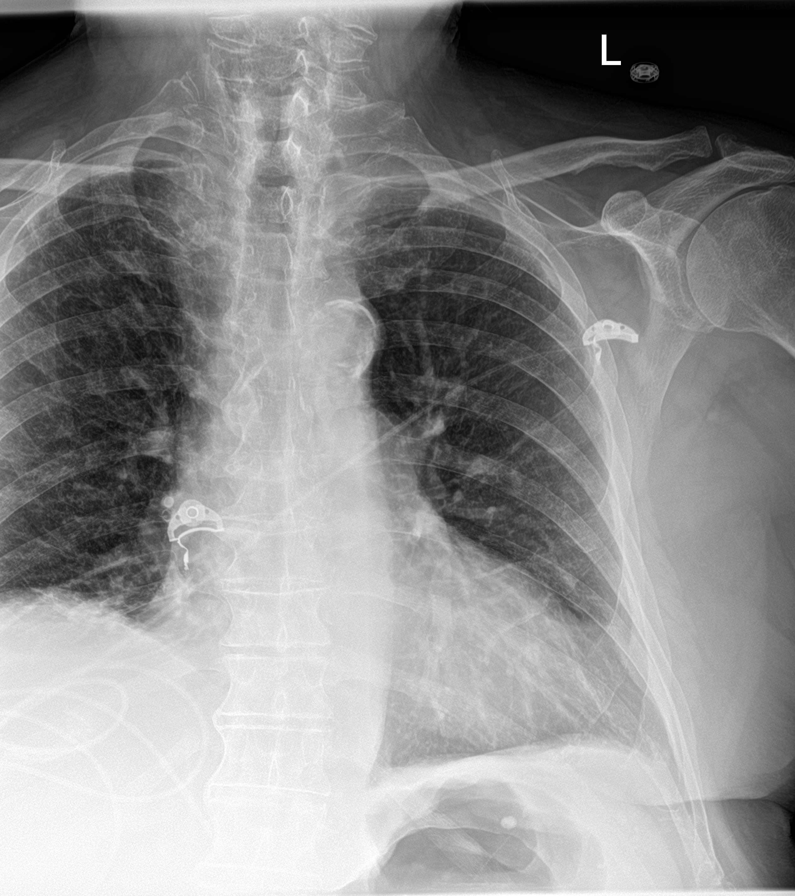

[7 of 7 positions shown; findings below may reference images not displayed]

FINDINGS: No acute fracture or other bone lesions are seen involving the ribs.
There is a chronic fracture of the distal left seventh rib, seen on
prior CT. There is no evidence of pneumothorax or pleural effusion.
Both lungs are clear. Heart size and mediastinal contours are within
normal limits.
IMPRESSION: No acute rib fracture. Chronic fracture of the distal left seventh
rib.

## 2019-11-08 IMAGING — DX DG FEMUR 2+V*L*
4 series · 4 of 4 positions shown · non-contrast
Comparison: None.

CLINICAL DATA: Fall, pain

EXAM:
LEFT FEMUR 2 VIEWS

[femur ap]
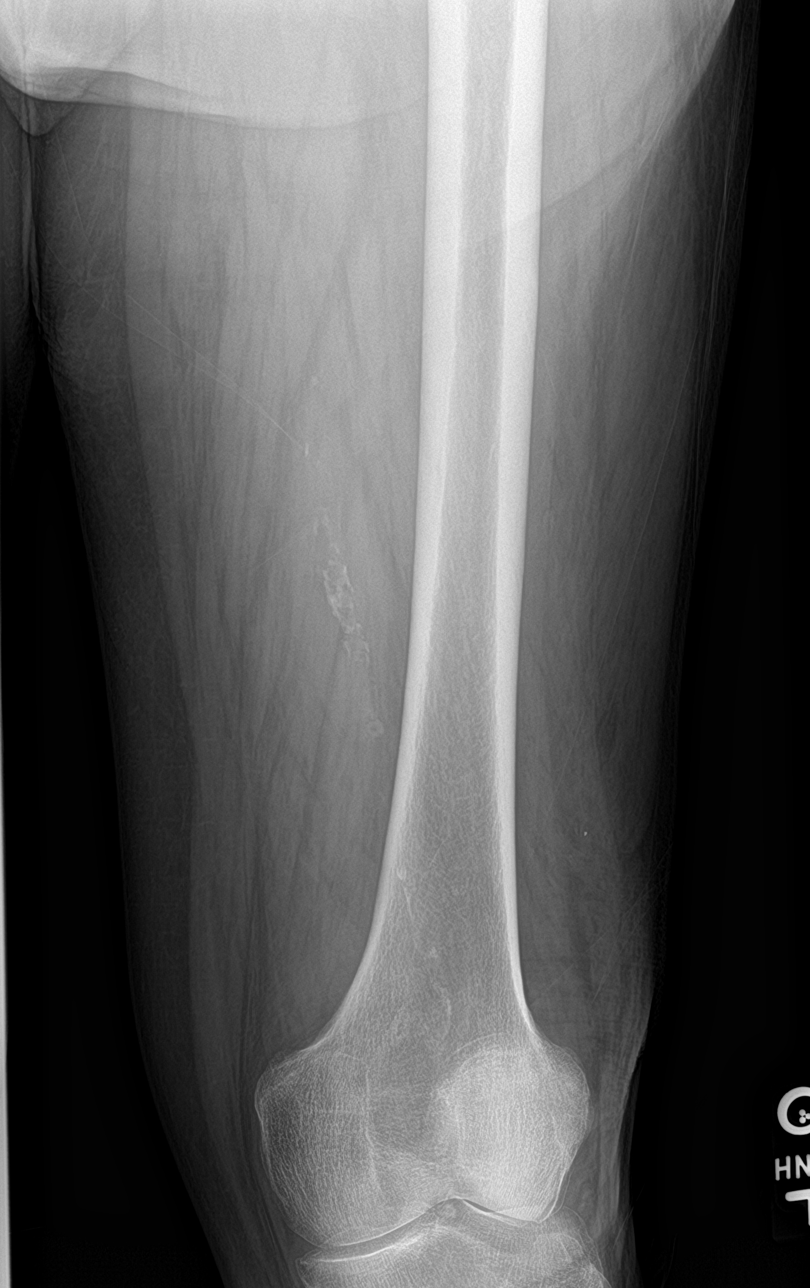

[hip ap]
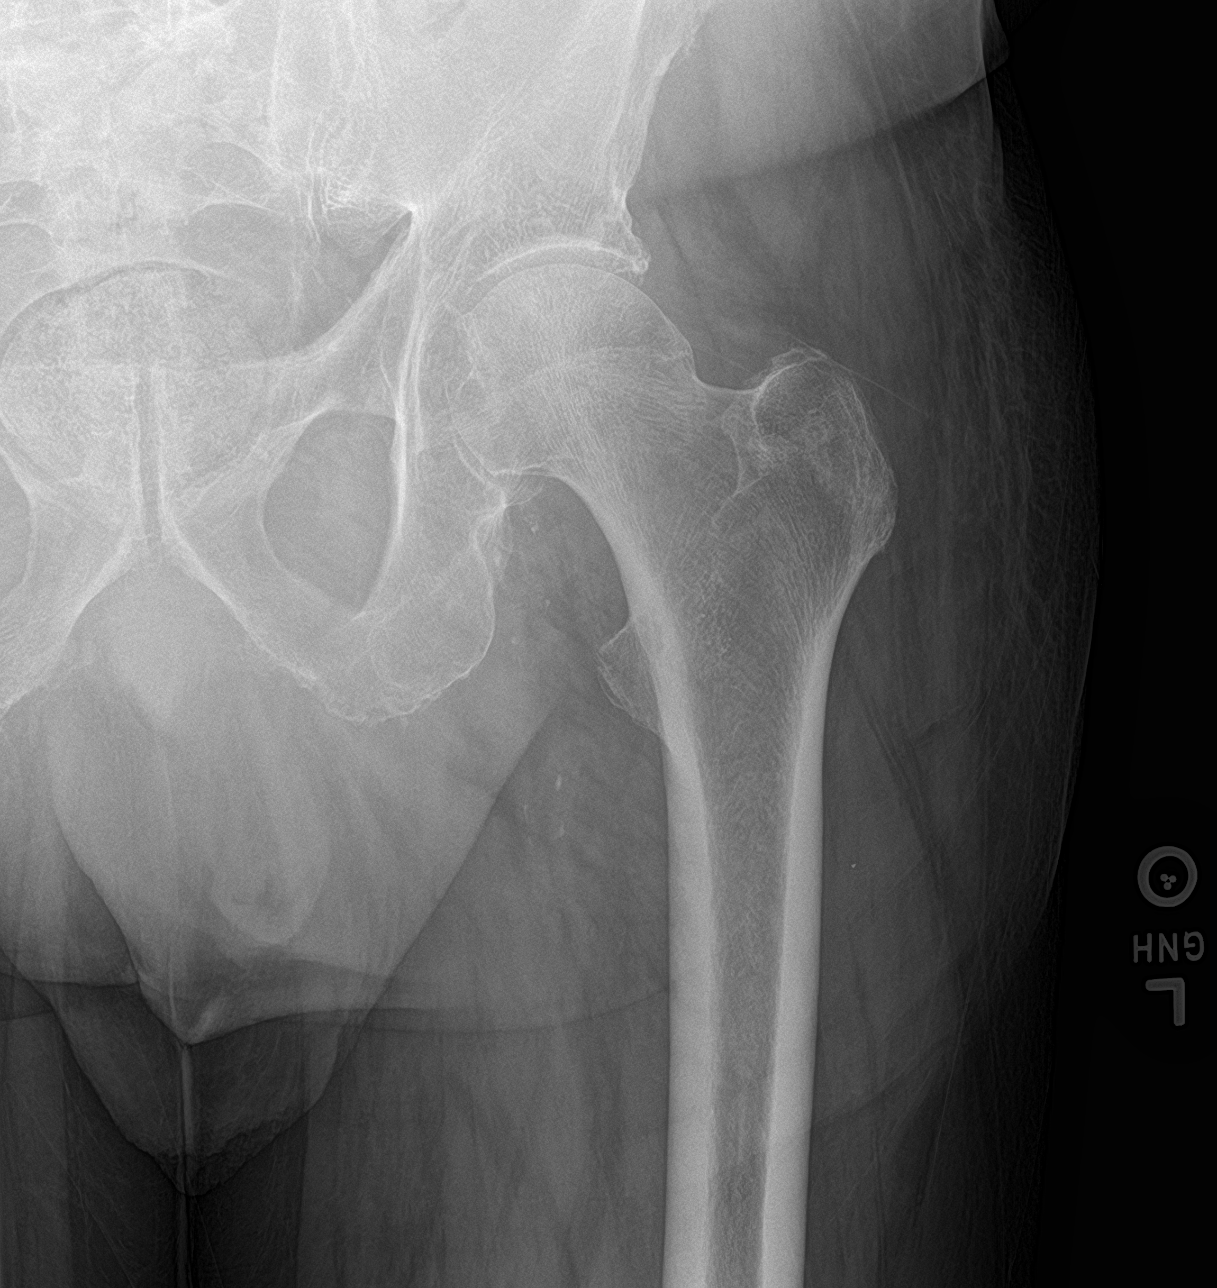

[hip lat]
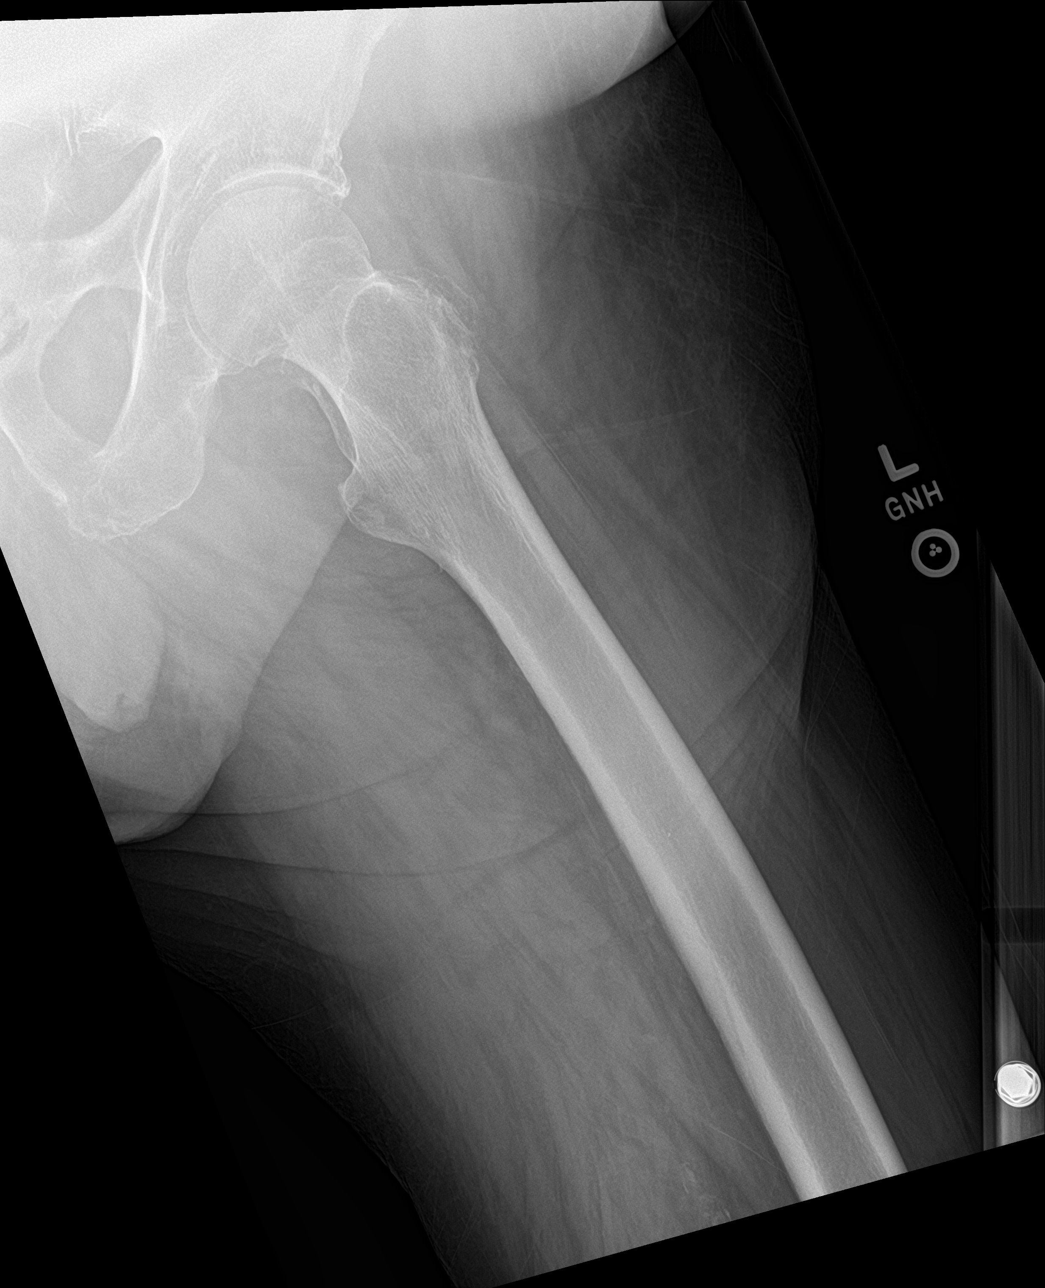

[femur lat]
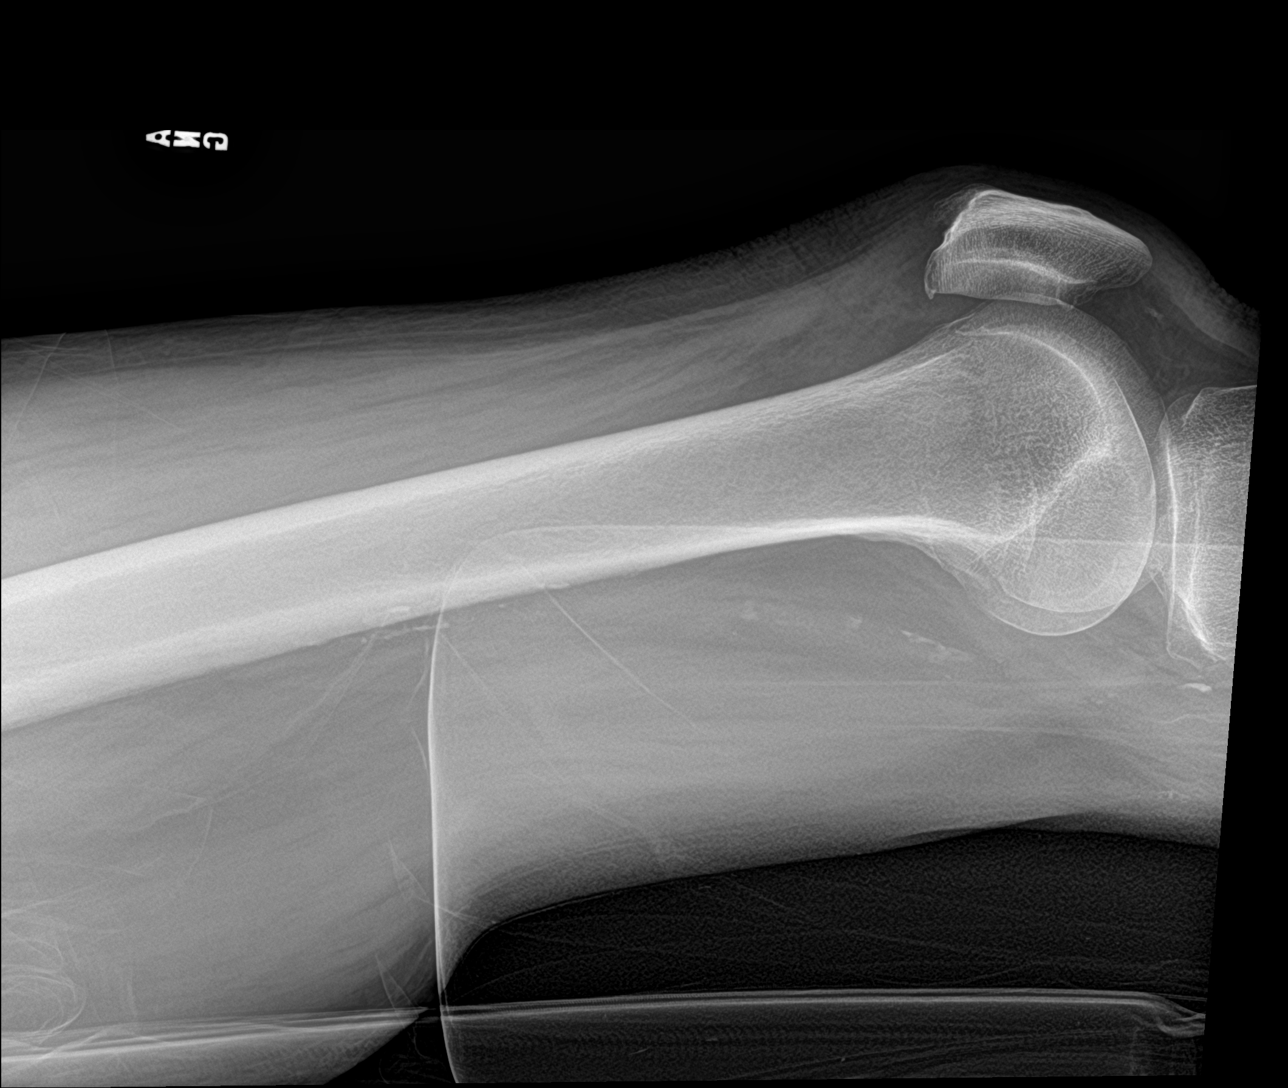

[4 of 4 positions shown; findings below may reference images not displayed]

FINDINGS: Osteopenia. No fracture or dislocation of the left femur. Vascular
calcinosis.
IMPRESSION: No fracture or dislocation of the left femur.

## 2019-11-08 IMAGING — DX DG HIP (WITH OR WITHOUT PELVIS) 2-3V*L*
3 series · 3 of 3 positions shown · non-contrast
Comparison: None.

CLINICAL DATA: Left hip pain.

EXAM:
DG HIP (WITH OR WITHOUT PELVIS) 2-3V LEFT

[pelvis ap]
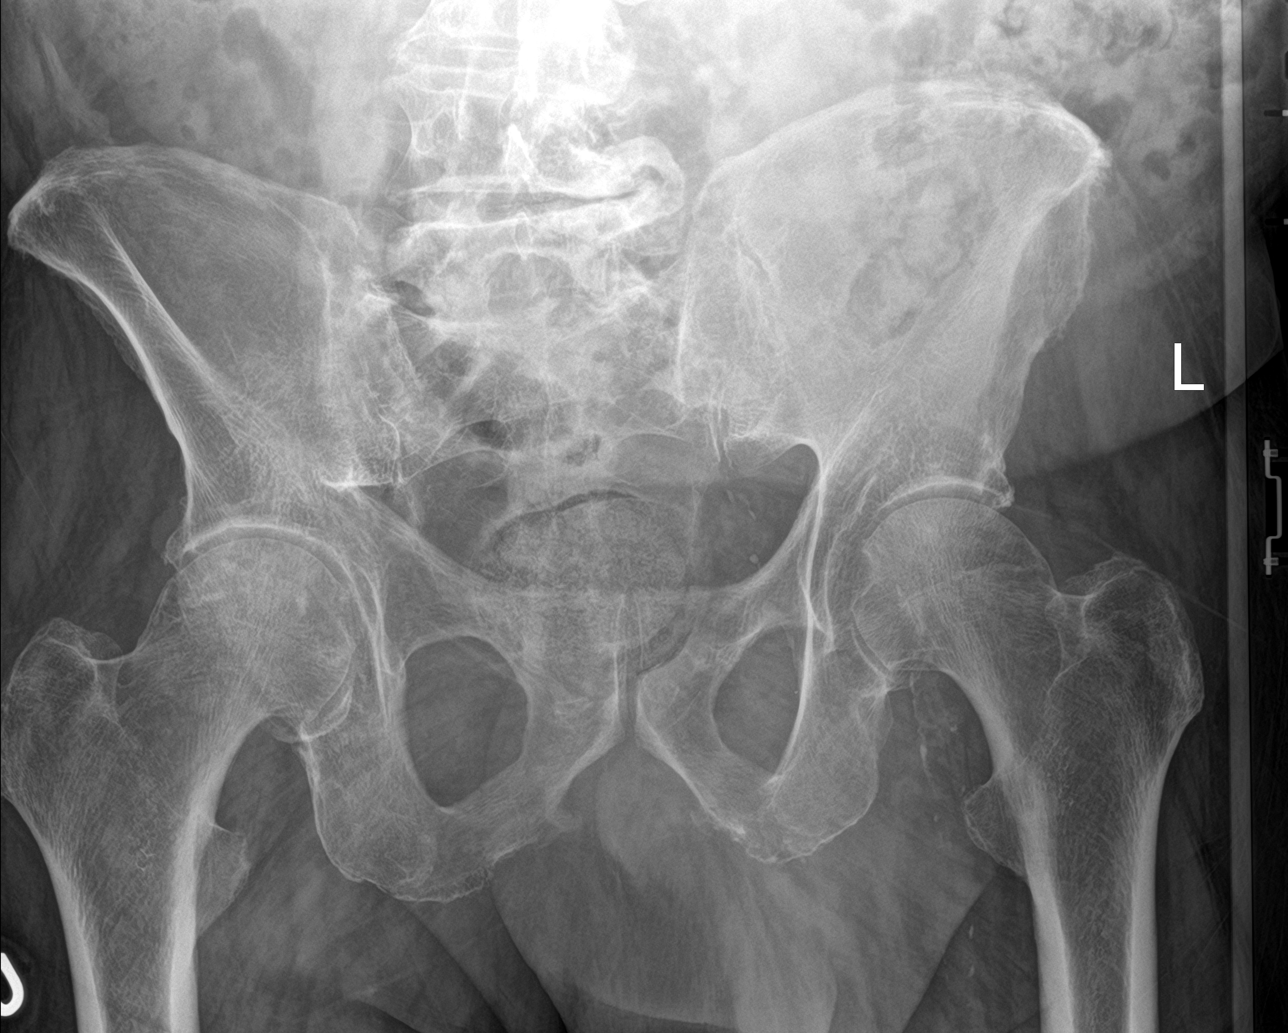

[hip ap]
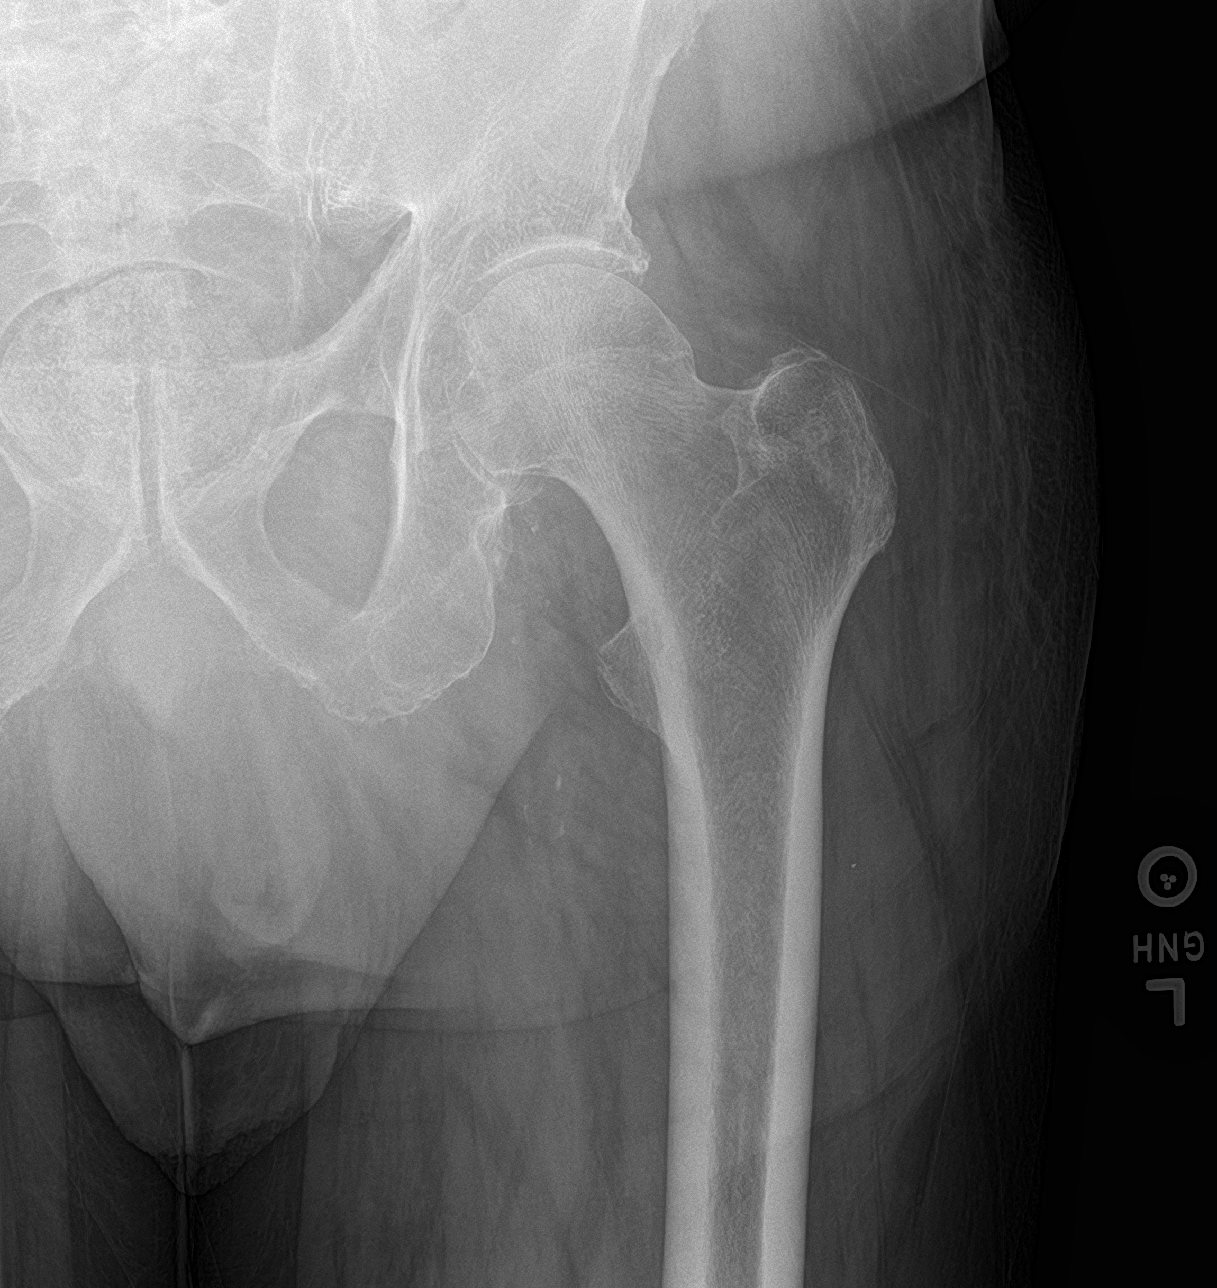

[hip lat]
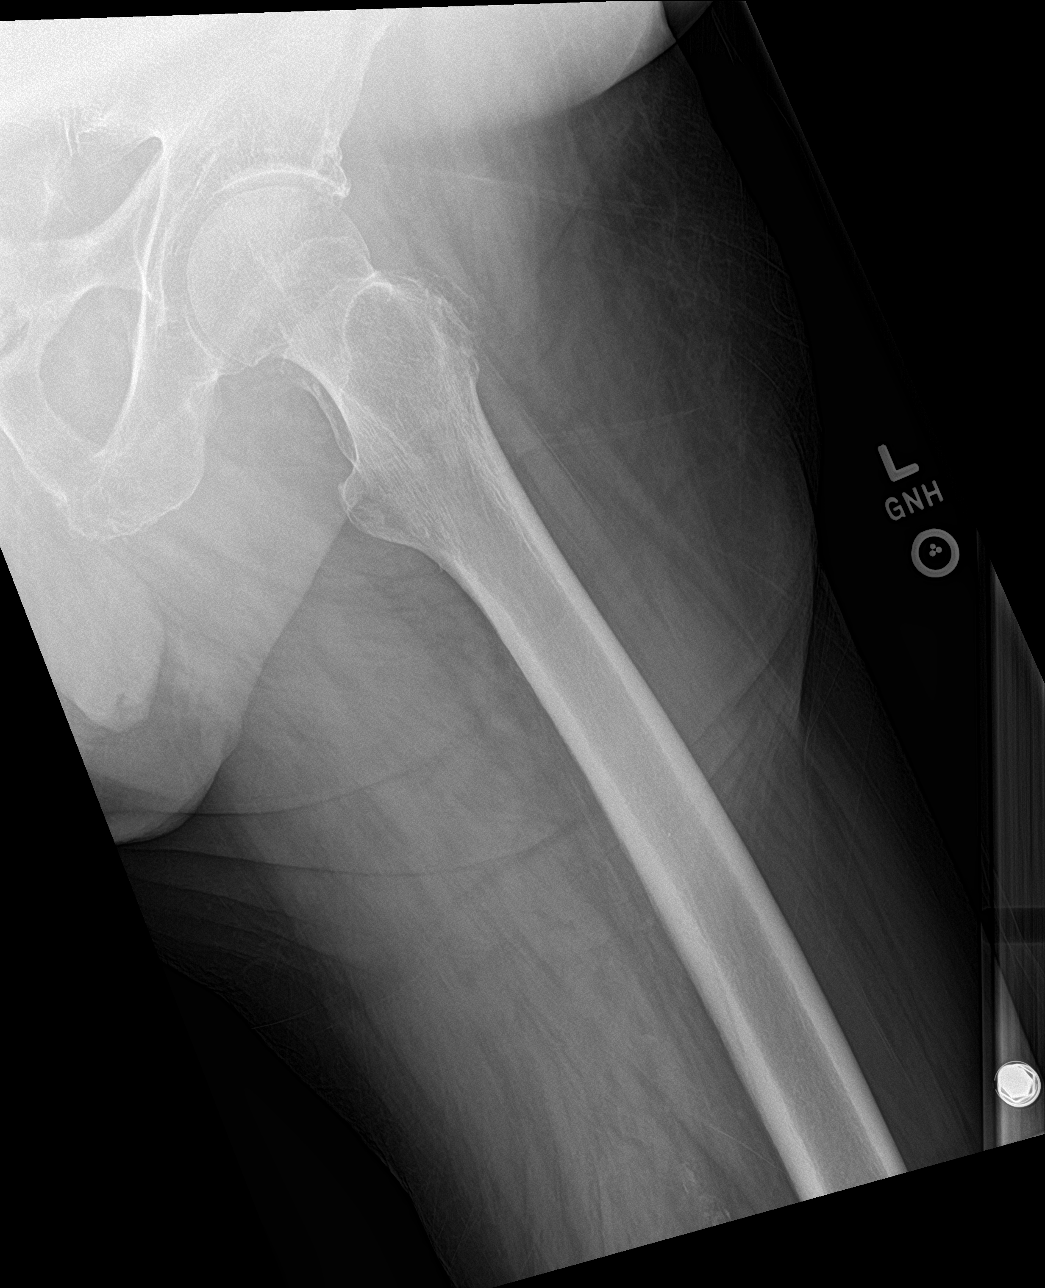

[3 of 3 positions shown; findings below may reference images not displayed]

FINDINGS: There is no evidence of hip fracture or dislocation. There is no
evidence of arthropathy or other focal bone abnormality.
IMPRESSION: Negative.

## 2019-11-08 MED ORDER — HYDROXYZINE HCL 25 MG PO TABS
25.0000 mg | ORAL_TABLET | Freq: Three times a day (TID) | ORAL | Status: DC | PRN
  Administered 2019-11-08 (×2): 25 mg via ORAL
  Filled 2019-11-08 (×2): qty 1

## 2019-11-08 NOTE — Progress Notes (Addendum)
  Arm more swollen this am and appears top of arm has a 1 in water blister. Concern .OT ok to move wrist, hand, and elbow. When will patient be able to use right arm for eating/ using urinal?

## 2019-11-08 NOTE — Progress Notes (Addendum)
Pt states he can not feel his fingers being touched or move them. Reports he isnt feeling any pain this morning . Tolerating PO well if appropriate to D/c fluids.

## 2019-11-08 NOTE — Plan of Care (Signed)
Patient up to chair for half of shift.  Medicated for pain in hips and right arm x 2 with improvement.  Patient refused Tylenol as he states anything but Morphine is a "placebo".  Daughter at bedside.

## 2019-11-08 NOTE — Progress Notes (Signed)
Subjective: 1 Day Post-Op Procedure(s) (LRB): OPEN REDUCTION INTERNAL FIXATION (ORIF) DISTAL HUMERUS FRACTURE (Right) Patient seen in rounds this morning for Dr. Onnie Graham Patient reports pain as mild, block still working.   Patient has no complaints this morning, still does not have sensation back in his arm hand or wrist as well as decreased function of right arm due to block.  Objective: Vital signs in last 24 hours: Temp:  [97.5 F (36.4 C)-98.2 F (36.8 C)] 97.7 F (36.5 C) (10/02 0428) Pulse Rate:  [59-73] 59 (10/02 0428) Resp:  [11-24] 18 (10/02 0428) BP: (123-161)/(47-59) 124/51 (10/02 0428) SpO2:  [88 %-98 %] 96 % (10/02 0428) Weight:  [91.2 kg] 91.2 kg (10/01 1015)  Intake/Output from previous day: 10/01 0701 - 10/02 0700 In: 1998 [P.O.:420; I.V.:1378; IV Piggyback:200] Out: 1400 [Urine:1250; Blood:150] Intake/Output this shift: No intake/output data recorded.  Recent Labs    11/06/19 0425 11/07/19 0441 11/08/19 0621  HGB 14.1 14.0 13.6   Recent Labs    11/07/19 0441 11/08/19 0621  WBC 10.6* 11.2*  RBC 3.85* 3.77*  HCT 41.7 40.9  PLT 198 220   Recent Labs    11/07/19 0441 11/08/19 0621  NA 136 135  K 4.2 4.8  CL 99 100  CO2 27 28  BUN 17 15  CREATININE 1.01 0.74  GLUCOSE 121* 129*  CALCIUM 8.6* 8.4*   No results for input(s): LABPT, INR in the last 72 hours.  Neurologically intact Intact pulses distally Incision: dressing C/D/I Compartment soft   Assessment/Plan: 1 Day Post-Op Procedure(s) (LRB): OPEN REDUCTION INTERNAL FIXATION (ORIF) DISTAL HUMERUS FRACTURE (Right) Advance diet Up with therapy  Patient should start working with therapy today, should remain in sling at all times.     Nettie Elm [facility] [phone] 11/08/2019, 8:39 AM

## 2019-11-08 NOTE — Progress Notes (Signed)
PROGRESS NOTE    Michael Ware  UTM:[id number] DOB: September 05, 1931 DOA: 11/04/2019 PCP: Alroy Dust, L.Marlou Sa, MD    Chief Complaint  Patient presents with  . Near Syncope  . Fall    Brief Narrative:  Patient is a 86 year old gentleman prior history of bradycardia, vitamin D deficiency, presented to the ED after mechanical fall.  Patient denies any head injury or loss of consciousness.  When EMS arrived at patient's home patient noted to have a near syncopal episode when they helped him off the floor.  Imaging showed a right humerus fracture.  EKG done was concerning for bradycardia.  Patient also noted to be hypotensive on admission.  Orthopedics and cardiology consulted and following.   Assessment & Plan:   Active Problems:   Humerus fracture   Bradycardia   Near syncope   Depression   Lactic acidosis   AKI (acute kidney injury) (HCC)   Hyperlipidemia   Hypotension  1 presyncope Likely secondary to hypotension versus vasovagal secondary to pain from humerus fracture.  Patient noted when EMS arrived to have a near syncopal episode.  Patient noted on admission presentation to be hypotensive and placed on IV fluids.  Repeat orthostatics done 11/05/2019 unremarkable.  2D echo with very technically difficult study with poor echo windows, EF of 70 to 75%, mild left ventricular hypertrophy, grade 1 diastolic dysfunction.  Patient seen in consultation by cardiology who are recommending outpatient event monitor.  IV fluids saline lock.  Patient was placed back on gentle hydration in preparation for surgery on 11/07/2019.  DC IV fluids.  Follow.    2.  Right humerus fracture Secondary to mechanical fall. Patient seen in consultation by orthopedics who initially well recommending conservative treatment and patient initially placed in a Sarmiento type humerus fracture brace, with repeat x-rays to follow-up on overall alignment.  Repeat x-rays done 11/06/2019 with severe displacement at the fracture site  with greater than 200% translation of the shaft per orthopedics.  Orthopedics recommended surgery and patient underwent ORIF of humeral shaft fracture (11/07/2019).  Patient noted to have received a block and still with no sensation to right upper extremity.  Continue current pain regimen of oxycodone 10 mg every 4 hours as needed. IV morphine as needed.  Could likely discontinue scheduled Tylenol today.  PT/ OT.  Orthopedics following.   3.  Bradycardia Questionable etiology.  Patient noted to be bradycardic on admission.  2D echo done with a EF of 70 to 75%, no wall motion abnormalities, grade 1 diastolic dysfunction.  Patient noted to be hypotensive on admission which was felt likely could be from a vasovagal event.  Heart rate improved.  Patient seen in consultation by cardiology who are recommending 30-day event monitor to assess for symptomatic bradycardia arrhythmias with outpatient follow-up with Dr. Rayann Heman in 4 weeks.  Appreciate cardiology input and recommendations.  4.  Hypotension Likely secondary to hypovolemia as patient noted to be hypotensive on admission.  Blood pressure responded to IV fluids.  Saline lock IV fluids.  Minimize narcotics.  Status post ORIF.  Blood pressure currently stable.  Follow.   5.  Acute kidney injury Resolved with hydration.  Follow.    6.  Leukocytosis Likely reactive leukocytosis.  Chest x-ray with no acute infiltrate.  Urinalysis unremarkable.  Patient currently afebrile.  Leukocytosis fluctuating and likely reactive postoperatively.  Hold off on antibiotics.  7.  Depression Prozac.   8.  Hyperlipidemia Statin.   9.  Lactic acidosis/HAGMA Likely secondary to dehydration/volume depletion in  the setting of hypotension.  Urinalysis unremarkable.  Chest x-ray unremarkable.  Patient currently afebrile.  Lactic acidosis has resolved with hydration.  Anion gap has normalized.  Follow.  10.  Right rib pain/left lower extremity pain Patient with complaints  of right rib pain and left lower extremity pain.  Patient stated really did not realize significant pain he had as he was more focused on his right upper extremity pain.  Noted to have fallen prior to admission.  Check rib films.  Check films of the pelvis and left hip, plain films of left femur.  Follow.   DVT prophylaxis: SCDs Code Status: DNR Family Communication: Updated patient.  No family at bedside.  Disposition:   Status is: Inpatient    Dispo: The patient is from: Home              Anticipated d/c is to: SNF              Anticipated d/c date is: 2 days.                Patient status post ORIF humeral shaft fracture, physical therapy evaluating.  Not stable for discharge.        Consultants:   Orthopedics: Jenetta Loges, PA/Dr. Onnie Graham 11/04/2019  Cardiology: Dr. Radford Pax 11/04/2019  Procedures:   Plain films of the right humerus 11/05/2019, 11/04/2019, 11/06/2019  Chest x-ray 11/04/2019  Plain films of the left knee 11/04/2019  2D echo 11/04/2019  ORIF of displaced right humeral shaft fracture per Dr. Onnie Graham 11/07/2019  Antimicrobials:   None   Subjective: Patient laying in bed.  Complains of pain in the right rib area as well as pain in his left lower extremity.  Denies any significant pain in the right upper extremity.  Patient with no sensation to right arm and wrist secondary to block per orthopedics.   Objective: Vitals:   11/07/19 1658 11/07/19 1825 11/07/19 2100 11/08/19 0428  BP: (!) 130/48 (!) 123/56 (!) 149/56 (!) 124/51  Pulse: 65 72 68 (!) 59  Resp: 18 18  18   Temp: 98.2 F (36.8 C) 98.1 F (36.7 C) 98 F (36.7 C) 97.7 F (36.5 C)  TempSrc: Oral Axillary Oral   SpO2: 93% 94%  96%  Weight:      Height:        Intake/Output Summary (Last 24 hours) at 11/08/2019 1238 Last data filed at 11/08/2019 1217 Gross per 24 hour  Intake 2118.01 ml  Output 1750 ml  Net 368.01 ml   Filed Weights   11/04/19 0045 11/06/19 0822 11/07/19 1015  Weight: 91.2  kg 97.5 kg 91.2 kg    Examination:  General exam: NAD Respiratory system: CTAB.  No wheezes, no crackles, no rhonchi.  Normal respiratory effort.  Cardiovascular system: Regular rate and rhythm no murmurs rubs or gallops.  No JVD.  No lower extremity edema.  Gastrointestinal system: Abdomen is soft, nontender, nondistended, positive bowel sounds.  No rebound.  No guarding.  Central nervous system: Alert and oriented.  No focal neurological deficits.  Extremities: Right upper extremity in sling.  Swelling to right upper extremity.  Skin: No rashes, lesions or ulcers Psychiatry: Judgement and insight appear normal. Mood & affect appropriate.     Data Reviewed: I have personally reviewed following labs and imaging studies  CBC: Recent Labs  Lab 11/04/19 0113 11/05/19 0453 11/06/19 0425 11/07/19 0441 11/08/19 0621  WBC 18.4* 9.2 10.1 10.6* 11.2*  NEUTROABS 14.0*  --   --  7.3  9.4*  HGB 16.0 14.0 14.1 14.0 13.6  HCT 44.9 42.2 41.4 41.7 40.9  MCV 103.7* 109.0* 107.8* 108.3* 108.5*  PLT 273 189 179 198 086    Basic Metabolic Panel: Recent Labs  Lab 11/04/19 0113 11/05/19 0453 11/06/19 0425 11/07/19 0441 11/08/19 0621  NA 139 139 134* 136 135  K 3.7 3.9 4.0 4.2 4.8  CL 100 106 102 99 100  CO2 21* 26 25 27 28   GLUCOSE 135* 111* 106* 121* 129*  BUN 22 19 16 17 15   CREATININE 1.38* 1.07 1.04 1.01 0.74  CALCIUM 9.3 8.2* 8.2* 8.6* 8.4*  MG 2.5*  --  2.0 2.0  --     GFR: Estimated Creatinine Clearance: 74 mL/min (by C-G formula based on SCr of 0.74 mg/dL).  Liver Function Tests: Recent Labs  Lab 11/05/19 0453 11/06/19 0425  AST 52* 58*  ALT 36 45*  ALKPHOS 64 63  BILITOT 1.1 1.5*  PROT 5.4* 5.6*  ALBUMIN 3.1* 3.1*    CBG: Recent Labs  Lab 11/05/19 1130 11/05/19 1747 11/06/19 0021 11/06/19 0546  GLUCAP 165* 136* 127* 108*     Recent Results (from the past 240 hour(s))  Respiratory Panel by RT PCR (Flu A&B, Covid) - Nasopharyngeal Swab     Status: None    Collection Time: 11/04/19 12:49 AM   Specimen: Nasopharyngeal Swab  Result Value Ref Range Status   SARS Coronavirus 2 by RT PCR NEGATIVE NEGATIVE Final    Comment: (NOTE) SARS-CoV-2 target nucleic acids are NOT DETECTED.  The SARS-CoV-2 RNA is generally detectable in upper respiratoy specimens during the acute phase of infection. The lowest concentration of SARS-CoV-2 viral copies this assay can detect is 131 copies/mL. A negative result does not preclude SARS-Cov-2 infection and should not be used as the sole basis for treatment or other patient management decisions. A negative result may occur with  improper specimen collection/handling, submission of specimen other than nasopharyngeal swab, presence of viral mutation(s) within the areas targeted by this assay, and inadequate number of viral copies (<131 copies/mL). A negative result must be combined with clinical observations, patient history, and epidemiological information. The expected result is Negative.  Fact Sheet for Patients:  PinkCheek.be  Fact Sheet for Healthcare Providers:  GravelBags.it  This test is no t yet approved or cleared by the [place] FDA and  has been authorized for detection and/or diagnosis of SARS-CoV-2 by FDA under an Emergency Use Authorization (EUA). This EUA will remain  in effect (meaning this test can be used) for the duration of the COVID-19 declaration under Section 564(b)(1) of the Act, 21 U.S.C. section 360bbb-3(b)(1), unless the authorization is terminated or revoked sooner.     Influenza A by PCR NEGATIVE NEGATIVE Final   Influenza B by PCR NEGATIVE NEGATIVE Final    Comment: (NOTE) The Xpert Xpress SARS-CoV-2/FLU/RSV assay is intended as an aid in  the diagnosis of influenza from Nasopharyngeal swab specimens and  should not be used as a sole basis for treatment. Nasal washings and  aspirates are unacceptable for Xpert  Xpress SARS-CoV-2/FLU/RSV  testing.  Fact Sheet for Patients: PinkCheek.be  Fact Sheet for Healthcare Providers: GravelBags.it  This test is not yet approved or cleared by the [place] FDA and  has been authorized for detection and/or diagnosis of SARS-CoV-2 by  FDA under an Emergency Use Authorization (EUA). This EUA will remain  in effect (meaning this test can be used) for the duration of the  Covid-19 declaration under  Section 564(b)(1) of the Act, 21  U.S.C. section 360bbb-3(b)(1), unless the authorization is  terminated or revoked. Performed at [facility], [place] [address], [place], [place] [address]   Blood culture (routine x 2)     Status: None (Preliminary result)   Collection Time: 11/04/19  1:57 AM   Specimen: BLOOD  Result Value Ref Range Status   Specimen Description   Final    BLOOD LEFT ANTECUBITAL Performed at [facility] [address], [place], [place] [address]    Special Requests   Final    BOTTLES DRAWN AEROBIC AND ANAEROBIC Blood Culture results may not be optimal due to an excessive volume of blood received in culture bottles Performed at [facility] [address], [place], [place] [address]    Culture   Final    NO GROWTH 4 DAYS Performed at [facility], [place] [address] [place], [place] [address]    Report Status PENDING  Incomplete  Blood culture (routine x 2)     Status: None (Preliminary result)   Collection Time: 11/04/19  9:36 AM   Specimen: BLOOD LEFT HAND  Result Value Ref Range Status   Specimen Description   Final    BLOOD LEFT HAND Performed at [facility] [address], [place], [place] [address]    Special Requests   Final    BOTTLES DRAWN AEROBIC AND ANAEROBIC Blood Culture results may not be optimal due to an inadequate volume of blood received in culture bottles Performed at [facility] [address] [place], [place] [address]    Culture   Final    NO GROWTH 4 DAYS Performed at [facility], [place] [address], [place], [place] [address]    Report Status PENDING  Incomplete  Surgical pcr screen     Status: None   Collection Time: 11/06/19  4:00 PM   Specimen: Nasal Mucosa; Nasal Swab  Result Value Ref Range Status   MRSA, PCR NEGATIVE NEGATIVE Final   Staphylococcus aureus NEGATIVE NEGATIVE Final    Comment: (NOTE) The Xpert SA Assay (FDA approved for NASAL specimens in patients 15 years of age and older), is one component of a comprehensive surveillance program. It is not intended to diagnose infection nor to guide or monitor treatment. Performed at [facility], [place] [address] [place], [place] [address]          Radiology Studies: DG Humerus Right  Result Date: 11/07/2019 CLINICAL DATA:  Right humerus ORIF EXAM: RIGHT HUMERUS - 2+ VIEW COMPARISON:  11/06/2019 FINDINGS: 3 C-arm fluoroscopic images were obtained intraoperatively and submitted for post operative interpretation. Interval placement of lateral sideplate and screw fixation construct traversing right humeral diaphyseal fracture. Near anatomic alignment. 11.8 seconds fluoroscopy time utilized. Please see the performing provider's procedural report for further detail. IMPRESSION: As above. Electronically Signed   By: Davina Poke D.O.   On: 11/07/2019 15:02   DG Humerus Right  Result Date: 11/06/2019 CLINICAL DATA:  Fracture EXAM: RIGHT HUMERUS - 2+ VIEW COMPARISON:  November 05, 2019 FINDINGS: Patient was imaged in brace, upright and positioning. There is an obliquely oriented fracture through the junction of the proximal and mid thirds of the right humerus. On the frontal view, there is 1.6 cm of lateral displacement of the distal fracture fragment with respect proximal fragment. On lateral view, there is 2.6 cm of posterior displacement of the  distal fracture fragment  respect proximal fragment. No dislocation evident. Joint spaces appear unremarkable. No erosion. IMPRESSION: Obliquely oriented fracture junction of proximal and mid thirds of humerus with significant displacement of fracture fragments as noted. No dislocation. No appreciable underlying arthropathy. Electronically Signed   By: Lowella Grip III M.D.   On: 11/06/2019 13:21   DG C-Arm 1-60 Min-No Report  Result Date: 11/07/2019 Fluoroscopy was utilized by the requesting physician.  No radiographic interpretation.        Scheduled Meds: . acetaminophen  500 mg Oral TID  . calcium carbonate  625 mg Oral Q breakfast  . cholecalciferol  5,000 Units Oral Daily  . docusate sodium  100 mg Oral BID  . FLUoxetine  10 mg Oral Daily  . magnesium gluconate  500 mg Oral Daily  . pravastatin  40 mg Oral Daily  . zinc sulfate  220 mg Oral Daily   Continuous Infusions:    LOS: 4 days    Time spent: 35 minutes    Irine Seal, MD [facility]   To contact the attending provider between 7A-7P or the covering provider during after hours 7P-7A, please log into the web site [email or web address] and access using universal [place] password for that web site. If you do not have the password, please call the hospital operator.  11/08/2019, 12:38 PM

## 2019-11-08 NOTE — Evaluation (Signed)
Occupational Therapy RE-Evaluation Patient Details Name: Michael Ware MRN: [id number] DOB: 1931/04/01 Today's Date: 11/08/2019    History of Present Illness 86 yo male admitted with R midshaft humerus fx after falling at home. Pt underwent ORIF of RT humeral fracture on 11/07/19.  Please refer to new OT shoulder precautions/restrictions. Hx of bradycardia   Clinical Impression   An occupational therapy re-evaluation was completed on this 86 year old, RIGHT handed,  male who is now s/p RT humeral ORIF on 11/07/19 with new sling in place and new RUE precautions and restrictions (see below). Patient is currently requiring assistance with all ADLs including full setup for seated grooming and eating, maximum to total assist for UE dressing, max assist for LE dressing, toileting and seated sponge bathing, all of which is below patient's typical baseline of being Modified independent.  During this evaluation, patient was limited by generalized weakness, decreased activity tolerance, decreased short term memory for new instructions, and new RUE restrictions, all of which has the potential to impact patient's safety and independence during functional mobility, as well as performance for ADLs. ?[facility]olin "6-clicks" Daily Activity Inpatient Short Form score of 14/24 indicates a moderate to severe ADL impairment this session. Patient lives alone with a  Daughter available PRN.  Patient demonstrates good rehab potential, and should benefit from continued skilled occupational therapy services while in acute care to maximize safety, independence and quality of life at home.  Continued occupational therapy services in a SNF setting prior to return home is recommended. Pt is requesting placement in [facility] ?   Follow Up Recommendations  SNF;Supervision/Assistance - 24 hour;Other (comment)    Equipment Recommendations  None recommended by OT    Recommendations for Other Services       Precautions  / Restrictions Precautions Precautions: Fall;Shoulder Type of Shoulder Precautions: Ordered10/02/21 0700: OT eval and treat PROM 10 ER, 45 ABD,60 FE , PASSIVE ROM FOR ADL's ONLY, NOT for EXERCISES. OK to exercise elbow wrist and hand rom and for edema control. No pendulums, may allow arm to dangle Pt may shower. Shoulder Interventions: Shoulder sling/immobilizer;Off for dressing/bathing/exercises Required Braces or Orthoses: Sling Restrictions Weight Bearing Restrictions: Yes RUE Weight Bearing: Non weight bearing      Mobility Bed Mobility Overal bed mobility:  (Pt up in recliner)                Transfers Overall transfer level: Needs assistance Equipment used: Rolling walker (2 wheeled) Transfers: Sit to/from Stand Sit to Stand: Min assist;+2 physical assistance;From elevated surface Stand pivot transfers: Min assist;+2 physical assistance;+2 safety/equipment       General transfer comment: instruct patient to push with L UE and use momentum to power up to standing from elevated recliner height.  Mod assist for anterior scooting to edge of chair with cues for LT hand placement to assist. Cues for slow and controlled descent to recliner.    Balance Overall balance assessment: Needs assistance;History of Falls Sitting-balance support: Feet supported Sitting balance-Leahy Scale: Fair     Standing balance support: Single extremity supported Standing balance-Leahy Scale: Poor Standing balance comment: Please refer to PT note for gait in hallway.  OT followed with chair for safety.                           ADL either performed or assessed with clinical judgement   ADL Overall ADL's : Needs assistance/impaired Eating/Feeding: Set up;Sitting   Grooming: Set up;Sitting  Upper Body Bathing: Sitting;Moderate assistance Upper Body Bathing Details (indicate cue type and reason): sponge-based on general assessment. Lower Body Bathing: Maximal  assistance;Sitting/lateral leans;Sit to/from stand Lower Body Bathing Details (indicate cue type and reason): sponge-based on general assessment. Upper Body Dressing : Total assistance;Sitting Upper Body Dressing Details (indicate cue type and reason): Due to restrictions Lower Body Dressing: Maximal assistance;Sitting/lateral leans;Sit to/from stand   Toilet Transfer: RW;Minimal assistance;+2 for physical assistance;+2 for safety/equipment Toilet Transfer Details (indicate cue type and reason): to recliner, min A for stability. Using RW in LT hand only. RUE in sling. Toileting- Clothing Manipulation and Hygiene: Moderate assistance;Sitting/lateral lean;Sit to/from stand       Functional mobility during ADLs: Minimal assistance;+2 for safety/equipment;+2 for physical assistance General ADL Comments: patient requiring increased assistance for self care due to pain, RUE restrictions decreased, activity tolerance, safety awareness, balance     Vision Baseline Vision/History: Wears glasses Wears Glasses: At all times Vision Assessment?: No apparent visual deficits     Perception     Praxis      Pertinent Vitals/Pain Pain Assessment: No/denies pain Pain Location: Pt reports that the numbess to RUE is still present but feeling in fingers is slowly returning. Pain Intervention(s): Monitored during session     Hand Dominance     Extremity/Trunk Assessment Upper Extremity Assessment Upper Extremity Assessment: RUE deficits/detail;Generalized weakness RUE Deficits / Details: Edema to RUE esp forearm/wrist/hand.  Pt able to perform ~1/3 active range to fingers for hand open/cose, PROM forearm sup/pronation WFL, AAROM wrist flex/ext WFL except for edema. RUE: Unable to fully assess due to immobilization RUE Sensation: decreased light touch RUE Coordination: decreased fine motor   Lower Extremity Assessment Lower Extremity Assessment: Defer to PT evaluation   Cervical / Trunk  Assessment Cervical / Trunk Assessment: Kyphotic   Communication     Cognition Arousal/Alertness: Awake/alert Behavior During Therapy: WFL for tasks assessed/performed Overall Cognitive Status: Within Functional Limits for tasks assessed                                     General Comments       Exercises Hand Exercises Forearm Supination: PROM;10 reps;Seated;Right Forearm Pronation: PROM;Seated;10 reps;Right Wrist Flexion: AAROM;10 reps;Seated;Right Wrist Extension: AAROM;10 reps;Seated;Right Digit Composite Flexion: AAROM;10 reps;Right;Seated Composite Extension: AAROM;Right;10 reps;Seated   Shoulder Instructions Shoulder Instructions Donning/doffing shirt without moving shoulder: Maximal assistance (Currently, pt is total assist to adjust sling. Declined to practive UE dressing at this time due to fatigue post gait.) Method for sponge bathing under operated UE:  (Pt educated on dangle method.) Donning/doffing sling/immobilizer: Maximal assistance Correct positioning of sling/immobilizer: Maximal assistance Pendulum exercises (written home exercise program):  (NA) ROM for elbow, wrist and digits of operated UE: Moderate assistance;Maximal assistance Proper positioning of operated UE when showering:  (Pt providede intial education)    Home Living                                          Prior Functioning/Environment                   OT Problem List: Decreased strength;Decreased activity tolerance;Decreased range of motion;Impaired balance (sitting and/or standing);Decreased coordination;Decreased safety awareness;Decreased knowledge of use of DME or AE;Decreased knowledge of precautions;Pain;Obesity;Impaired UE functional use      OT Treatment/Interventions: Self-care/ADL  training;Therapeutic exercise;Energy conservation;DME and/or AE instruction;Therapeutic activities;Patient/family education;Balance training    OT Goals(Current  goals can be found in the care plan section) Acute Rehab OT Goals Patient Stated Goal: To go to [facility] OT Goal Formulation: With patient Time For Goal Achievement: 11/22/19 Potential to Achieve Goals: Good ADL Goals Additional ADL Goal #2: Pt and/or caregiver (dtr) will demonstrate proper positioning of RUE in sling and donning/doffing sling Mod I. Additional ADL Goal #3: Pt and/or caregiver (dtr) will verbalize understanding to all RUE precautions and restrictions that must be followed during ADLs.  OT Frequency: Min 2X/week   Barriers to D/C:    Recent loss of spouse in September.       Co-evaluation PT/OT/SLP Co-Evaluation/Treatment: Yes (Partial co-visit for reassessment and OT alone for treatment time.) Reason for Co-Treatment: Necessary to address cognition/behavior during functional activity;Complexity of the patient's impairments (multi-system involvement);To address functional/ADL transfers          AM-PAC OT "6 Clicks" Daily Activity     Outcome Measure Help from another person eating meals?: A Little Help from another person taking care of personal grooming?: A Little Help from another person toileting, which includes using toliet, bedpan, or urinal?: A Lot Help from another person bathing (including washing, rinsing, drying)?: A Lot Help from another person to put on and taking off regular upper body clothing?: A Lot Help from another person to put on and taking off regular lower body clothing?: A Lot 6 Click Score: 14   End of Session Equipment Utilized During Treatment: Gait belt;Rolling walker;Other (comment) (Sling) Nurse Communication: Mobility status  Activity Tolerance: Patient tolerated treatment well;Patient limited by pain Patient left: in chair;with call bell/phone within reach;with chair alarm set  OT Visit Diagnosis: Unsteadiness on feet (R26.81);History of falling (Z91.81);Pain (decreased ADLs)                Time: 0802-2336 OT Time Calculation  (min): 45 min Charges:  OT Evaluation $OT Re-eval: 1 Re-eval OT Treatments $Self Care/Home Management : 8-22 mins $Therapeutic Exercise: 8-22 mins  Anderson Malta, [place] Office: [phone] 11/08/2019   Julien Girt 11/08/2019, 12:04 PM

## 2019-11-08 NOTE — Progress Notes (Signed)
Physical Therapy Treatment Patient Details Name: Michael Ware MRN: [id number] DOB: December 26, 1931 Today's Date: 11/08/2019    History of Present Illness 86 yo male admitted with R midshaft humerus fx after falling at home. Pt underwent ORIF of RT humeral fracture on 11/07/19.  Please refer to new OT shoulder precautions/restrictions now s/p ORIF 11/07/19. Hx of bradycardia    PT Comments    Pt now s/p ORIF of R humerus and continues cooperative and progressing slowly but steadily with mobility.  Pt balance continues significantly affected by lack of use of R UE.   Follow Up Recommendations  SNF     Equipment Recommendations  None recommended by PT    Recommendations for Other Services       Precautions / Restrictions Precautions Precautions: Fall;Shoulder Type of Shoulder Precautions: Ordered10/02/21 0700: OT eval and treat PROM 10 ER, 45 ABD,60 FE , PASSIVE ROM FOR ADL's ONLY, NOT for EXERCISES. OK to exercise elbow wrist and hand rom and for edema control. No pendulums, may allow arm to dangle Pt may shower. Shoulder Interventions: Shoulder sling/immobilizer;Off for dressing/bathing/exercises Required Braces or Orthoses: Sling Restrictions Weight Bearing Restrictions: Yes RUE Weight Bearing: Non weight bearing    Mobility  Bed Mobility Overal bed mobility:  (Pt up in recliner)             General bed mobility comments: Pt up to chair with nursing  Transfers Overall transfer level: Needs assistance Equipment used: Rolling walker (2 wheeled) Transfers: Sit to/from Stand Sit to Stand: Min assist;+2 physical assistance;From elevated surface Stand pivot transfers: Min assist;+2 physical assistance;+2 safety/equipment       General transfer comment: instruct patient to push with L UE and use momentum to power up to standing from elevated recliner height.  Mod assist for anterior scooting to edge of chair with cues for LT hand placement to assist. Cues for slow and  controlled descent to recliner.  Ambulation/Gait Ambulation/Gait assistance: Min assist;+2 physical assistance;+2 safety/equipment Gait Distance (Feet): 28 Feet Assistive device: Rolling walker (2 wheeled) Gait Pattern/deviations: Step-to pattern;Decreased step length - right;Decreased step length - left;Shuffle;Trunk flexed Gait velocity: decr   General Gait Details: Use of L UE on RW with assist for stability and to control RW   Stairs             Wheelchair Mobility    Modified Rankin (Stroke Patients Only)       Balance Overall balance assessment: Needs assistance;History of Falls Sitting-balance support: Feet supported Sitting balance-Leahy Scale: Fair     Standing balance support: Single extremity supported Standing balance-Leahy Scale: Poor Standing balance comment: Please refer to PT note for gait in hallway.  OT followed with chair for safety.                            Cognition Arousal/Alertness: Awake/alert Behavior During Therapy: WFL for tasks assessed/performed Overall Cognitive Status: Within Functional Limits for tasks assessed                                        Exercises Hand Exercises Forearm Supination: PROM;10 reps;Seated;Right Forearm Pronation: PROM;Seated;10 reps;Right Wrist Flexion: AAROM;10 reps;Seated;Right Wrist Extension: AAROM;10 reps;Seated;Right Digit Composite Flexion: AAROM;10 reps;Right;Seated Composite Extension: AAROM;Right;10 reps;Seated Donning/doffing shirt without moving shoulder: Maximal assistance (Currently, pt is total assist to adjust sling. Declined to practive UE dressing at this  time due to fatigue post gait.) Method for sponge bathing under operated UE:  (Pt educated on dangle method.) Donning/doffing sling/immobilizer: Maximal assistance Correct positioning of sling/immobilizer: Maximal assistance Pendulum exercises (written home exercise program):  (NA) ROM for elbow, wrist and  digits of operated UE: Moderate assistance;Maximal assistance Proper positioning of operated UE when showering:  (Pt providede intial education)    General Comments        Pertinent Vitals/Pain Pain Assessment: No/denies pain Pain Location: Pt reports that the numbess to RUE is still present but feeling in fingers is slowly returning. Pain Descriptors / Indicators: Aching;Grimacing;Guarding Pain Intervention(s): Limited activity within patient's tolerance;Monitored during session    Home Living                      Prior Function            PT Goals (current goals can now be found in the care plan section) Acute Rehab PT Goals Patient Stated Goal: To go to [facility] PT Goal Formulation: With patient Time For Goal Achievement: 11/18/19 Potential to Achieve Goals: Good Progress towards PT goals: Progressing toward goals    Frequency    Min 3X/week      PT Plan Current plan remains appropriate    Co-evaluation PT/OT/SLP Co-Evaluation/Treatment: Yes Reason for Co-Treatment: For patient/therapist safety PT goals addressed during session: Mobility/safety with mobility OT goals addressed during session: ADL's and self-care      AM-PAC PT "6 Clicks" Mobility   Outcome Measure  Help needed turning from your back to your side while in a flat bed without using bedrails?: A Lot Help needed moving from lying on your back to sitting on the side of a flat bed without using bedrails?: A Lot Help needed moving to and from a bed to a chair (including a wheelchair)?: A Lot Help needed standing up from a chair using your arms (e.g., wheelchair or bedside chair)?: A Lot Help needed to walk in hospital room?: A Lot Help needed climbing 3-5 steps with a railing? : Total 6 Click Score: 11    End of Session Equipment Utilized During Treatment: Gait belt Activity Tolerance: Patient tolerated treatment well;Patient limited by fatigue Patient left: in chair;with call bell/phone  within reach;with chair alarm set Nurse Communication: Mobility status PT Visit Diagnosis: Muscle weakness (generalized) (M62.81);Pain;History of falling (Z91.81);Unsteadiness on feet (R26.81) Pain - Right/Left: Right Pain - part of body: Shoulder     Time: 9292-4462 PT Time Calculation (min) (ACUTE ONLY): 17 min  Charges:  $Gait Training: 8-22 mins                     [place] Pager [phone] Office [phone]    Trendon Zaring 11/08/2019, 12:54 PM

## 2019-11-09 LAB — BASIC METABOLIC PANEL
Anion gap: 11 (ref 5–15)
BUN: 20 mg/dL (ref 8–23)
CO2: 30 mmol/L (ref 22–32)
Calcium: 8.9 mg/dL (ref 8.9–10.3)
Chloride: 99 mmol/L (ref 98–111)
Creatinine, Ser: 1.02 mg/dL (ref 0.61–1.24)
GFR calc Af Amer: >60 mL/min (ref >60)
GFR calc non Af Amer: >60 mL/min (ref >60)
Glucose, Bld: 77 mg/dL (ref 70–99)
Potassium: 4.3 mmol/L (ref 3.5–5.1)
Sodium: 140 mmol/L (ref 135–145)

## 2019-11-09 LAB — CULTURE, BLOOD (ROUTINE X 2)
Culture: NO GROWTH
Culture: NO GROWTH

## 2019-11-09 LAB — CBC WITH DIFFERENTIAL/PLATELET
Abs Immature Granulocytes: 0.04 10*3/uL (ref 0.00–0.07)
Basophils Absolute: 0.1 10*3/uL (ref 0.0–0.1)
Basophils Relative: 0 %
Eosinophils Absolute: 0.2 10*3/uL (ref 0.0–0.5)
Eosinophils Relative: 1 %
HCT: 45.5 % (ref 39.0–52.0)
Hemoglobin: 15.6 g/dL (ref 13.0–17.0)
Immature Granulocytes: 0 %
Lymphocytes Relative: 19 %
Lymphs Abs: 2.4 10*3/uL (ref 0.7–4.0)
MCH: 36.7 pg — ABNORMAL HIGH (ref 26.0–34.0)
MCHC: 34.3 g/dL (ref 30.0–36.0)
MCV: 107.1 fL — ABNORMAL HIGH (ref 80.0–100.0)
Monocytes Absolute: 1.7 10*3/uL — ABNORMAL HIGH (ref 0.1–1.0)
Monocytes Relative: 14 %
Neutro Abs: 8.3 10*3/uL — ABNORMAL HIGH (ref 1.7–7.7)
Neutrophils Relative %: 66 %
Platelets: 241 10*3/uL (ref 150–400)
RBC: 4.25 MIL/uL (ref 4.22–5.81)
RDW: 12.4 % (ref 11.5–15.5)
WBC: 12.7 10*3/uL — ABNORMAL HIGH (ref 4.0–10.5)
nRBC: 0 % (ref 0.0–0.2)

## 2019-11-09 MED ORDER — SENNOSIDES-DOCUSATE SODIUM 8.6-50 MG PO TABS
1.0000 | ORAL_TABLET | Freq: Two times a day (BID) | ORAL | Status: DC
  Administered 2019-11-09 – 2019-11-14 (×10): 1 via ORAL
  Filled 2019-11-09 (×10): qty 1

## 2019-11-09 MED ORDER — MORPHINE SULFATE 15 MG PO TABS
15.0000 mg | ORAL_TABLET | ORAL | Status: DC | PRN
  Administered 2019-11-09 – 2019-11-10 (×4): 15 mg via ORAL
  Filled 2019-11-09 (×4): qty 1

## 2019-11-09 NOTE — Progress Notes (Signed)
PROGRESS NOTE    ANTRONE WALLA  KWI:[id number] DOB: 09-Jun-1931 DOA: 11/04/2019 PCP: Alroy Dust, L.Marlou Sa, MD    Chief Complaint  Patient presents with  . Near Syncope  . Fall    Brief Narrative:  Patient is a 86 year old gentleman prior history of bradycardia, vitamin D deficiency, presented to the ED after mechanical fall.  Patient denies any head injury or loss of consciousness.  When EMS arrived at patient's home patient noted to have a near syncopal episode when they helped him off the floor.  Imaging showed a right humerus fracture.  EKG done was concerning for bradycardia.  Patient also noted to be hypotensive on admission.  Orthopedics and cardiology consulted and following.   Assessment & Plan:   Active Problems:   Humerus fracture   Bradycardia   Near syncope   Depression   Lactic acidosis   AKI (acute kidney injury) (HCC)   Hyperlipidemia   Hypotension   Acute pain of left lower extremity   Rib pain on right side   Dehydration  1 presyncope Likely secondary to hypotension versus vasovagal secondary to pain from humerus fracture.  Patient noted when EMS arrived to have a near syncopal episode.  Patient noted on admission presentation to be hypotensive and placed on IV fluids.  Repeat orthostatics done 11/05/2019 unremarkable.  2D echo with very technically difficult study with poor echo windows, EF of 70 to 75%, mild left ventricular hypertrophy, grade 1 diastolic dysfunction.  Patient seen in consultation by cardiology who are recommending outpatient event monitor.  IV fluids saline lock.  Patient was placed back on gentle hydration in preparation for surgery on 11/07/2019.  IV fluids have been discontinued.  No further episodes of presyncope.  Supportive care.   2.  Right humerus fracture Secondary to mechanical fall. Patient seen in consultation by orthopedics who initially well recommending conservative treatment and patient initially placed in a Sarmiento type humerus  fracture brace, with repeat x-rays to follow-up on overall alignment.  Repeat x-rays done 11/06/2019 with severe displacement at the fracture site with greater than 200% translation of the shaft per orthopedics.  Orthopedics recommended surgery and patient underwent ORIF of humeral shaft fracture (11/07/2019).  Patient noted to have received a block and initially with no sensation to the right upper extremity.  Patient has regained sensation to the right upper extremity.  Patient stating oxycodone not helping with pain.  Currently on IV morphine.  Discontinue oxycodone.  Placed on MSIR 15 mg every 4 hours as needed pain.  Scheduled Tylenol has been discontinued.  PT/OT.  Per orthopedics.    3.  Bradycardia Questionable etiology.  Patient noted to be bradycardic on admission.  2D echo done with a EF of 70 to 75%, no wall motion abnormalities, grade 1 diastolic dysfunction.  Patient noted to be hypotensive on admission which was felt likely could be from a vasovagal event.  Heart rate improved.  Patient seen in consultation by cardiology who are recommending 30-day event monitor to assess for symptomatic bradycardia arrhythmias with outpatient follow-up with Dr. Rayann Heman in 4 weeks.  Appreciate cardiology input and recommendations.  4.  Hypotension Likely secondary to hypovolemia as patient noted to be hypotensive on admission.  Blood pressure responded to IV fluids.  Saline lock IV fluids.  Minimize narcotics.  Status post ORIF.   5.  Acute kidney injury Resolved with hydration.  Follow.    6.  Leukocytosis Likely reactive leukocytosis.  Chest x-ray with no acute infiltrate.  Urinalysis unremarkable.  Patient currently afebrile.  Leukocytosis fluctuating and likely reactive postoperatively.  No need for antibiotics at this time.   7.  Depression Prozac.   8.  Hyperlipidemia Continue statin.   9.  Lactic acidosis/HAGMA Likely secondary to dehydration/volume depletion in the setting of hypotension.   Urinalysis unremarkable.  Chest x-ray unremarkable.  Patient currently afebrile.  Resolved with hydration.  Anion gap normalized.  Follow.   10.  Right rib pain/left lower extremity pain Patient with complaints of right rib pain and left lower extremity pain on 11/08/2019.Marland Kitchen  Patient stated really did not realize significant pain he had as he was more focused on his right upper extremity pain.  Noted to have fallen prior to admission.  Rib films negative for any fracture.  Plain films of the pelvis, left hip, left femur negative for any acute fracture.  Pain management.  Incentive spirometry.  Supportive care.    DVT prophylaxis: SCDs Code Status: DNR Family Communication: Updated patient.  Updated daughter at bedside.  Disposition:   Status is: Inpatient    Dispo: The patient is from: Home              Anticipated d/c is to: SNF              Anticipated d/c date is: 1 to 2 days when cleared by orthopedics.                Patient status post ORIF humeral shaft fracture, physical therapy evaluating.  Not stable for discharge.        Consultants:   Orthopedics: Jenetta Loges, PA/Dr. Onnie Graham 11/04/2019  Cardiology: Dr. Radford Pax 11/04/2019  Procedures:   Plain films of the right humerus 11/05/2019, 11/04/2019, 11/06/2019  Chest x-ray 11/04/2019  Plain films of the left knee 11/04/2019  2D echo 11/04/2019  ORIF of displaced right humeral shaft fracture per Dr. Onnie Graham 11/07/2019  Rib films 11/08/2019  Plain films of the left hip and pelvis/plain films of the left femur 11/09/2019  Antimicrobials:   None   Subjective: Sitting up in bed.  States numbness in right upper extremity has improved.  Still with pain in the right upper extremity.  Denies any chest pain or shortness of breath.  Denies any left lower extremity pain.   Objective: Vitals:   11/08/19 1301 11/08/19 2000 11/08/19 2006 11/09/19 0238  BP: (!) 115/52  (!) 132/46 (!) 143/59  Pulse: 66   67  Resp: 19   18  Temp: 97.8  F (36.6 C)  98.5 F (36.9 C) 98 F (36.7 C)  TempSrc: Oral  Oral   SpO2: 94% (!) 81% 92% 91%  Weight:      Height:        Intake/Output Summary (Last 24 hours) at 11/09/2019 1259 Last data filed at 11/09/2019 0200 Gross per 24 hour  Intake -  Output 500 ml  Net -500 ml   Filed Weights   11/04/19 0045 11/06/19 0822 11/07/19 1015  Weight: 91.2 kg 97.5 kg 91.2 kg    Examination:  General exam: NAD Respiratory system: Lungs clear to auscultation bilaterally.  No wheezes, no crackles, no rhonchi.  Normal respiratory effort.  Cardiovascular system: RRR no murmurs rubs or gallops.  No JVD.  No lower extremity edema. Gastrointestinal system: Abdomen is soft, nontender, nondistended, positive bowel sounds.  No rebound.  No guarding.  Central nervous system: Alert and oriented.  No focal neurological deficits.  Extremities: Right upper extremity in sling.  Swelling to right upper extremity  improved.  Skin: No rashes, lesions or ulcers Psychiatry: Judgement and insight appear normal. Mood & affect appropriate.     Data Reviewed: I have personally reviewed following labs and imaging studies  CBC: Recent Labs  Lab 11/04/19 0113 11/04/19 0113 11/05/19 0453 11/06/19 0425 11/07/19 0441 11/08/19 0621 11/09/19 0612  WBC 18.4*   < > 9.2 10.1 10.6* 11.2* 12.7*  NEUTROABS 14.0*  --   --   --  7.3 9.4* 8.3*  HGB 16.0   < > 14.0 14.1 14.0 13.6 15.6  HCT 44.9   < > 42.2 41.4 41.7 40.9 45.5  MCV 103.7*   < > 109.0* 107.8* 108.3* 108.5* 107.1*  PLT 273   < > 189 179 198 220 241   < > = values in this interval not displayed.    Basic Metabolic Panel: Recent Labs  Lab 11/04/19 0113 11/04/19 0113 11/05/19 0453 11/06/19 0425 11/07/19 0441 11/08/19 0621 11/09/19 0612  NA 139   < > 139 134* 136 135 140  K 3.7   < > 3.9 4.0 4.2 4.8 4.3  CL 100   < > 106 102 99 100 99  CO2 21*   < > 26 25 27 28 30   GLUCOSE 135*   < > 111* 106* 121* 129* 77  BUN 22   < > 19 16 17 15 20   CREATININE  1.38*   < > 1.07 1.04 1.01 0.74 1.02  CALCIUM 9.3   < > 8.2* 8.2* 8.6* 8.4* 8.9  MG 2.5*  --   --  2.0 2.0  --   --    < > = values in this interval not displayed.    GFR: Estimated Creatinine Clearance: 58 mL/min (by C-G formula based on SCr of 1.02 mg/dL).  Liver Function Tests: Recent Labs  Lab 11/05/19 0453 11/06/19 0425  AST 52* 58*  ALT 36 45*  ALKPHOS 64 63  BILITOT 1.1 1.5*  PROT 5.4* 5.6*  ALBUMIN 3.1* 3.1*    CBG: Recent Labs  Lab 11/05/19 1130 11/05/19 1747 11/06/19 0021 11/06/19 0546  GLUCAP 165* 136* 127* 108*     Recent Results (from the past 240 hour(s))  Respiratory Panel by RT PCR (Flu A&B, Covid) - Nasopharyngeal Swab     Status: None   Collection Time: 11/04/19 12:49 AM   Specimen: Nasopharyngeal Swab  Result Value Ref Range Status   SARS Coronavirus 2 by RT PCR NEGATIVE NEGATIVE Final    Comment: (NOTE) SARS-CoV-2 target nucleic acids are NOT DETECTED.  The SARS-CoV-2 RNA is generally detectable in upper respiratoy specimens during the acute phase of infection. The lowest concentration of SARS-CoV-2 viral copies this assay can detect is 131 copies/mL. A negative result does not preclude SARS-Cov-2 infection and should not be used as the sole basis for treatment or other patient management decisions. A negative result may occur with  improper specimen collection/handling, submission of specimen other than nasopharyngeal swab, presence of viral mutation(s) within the areas targeted by this assay, and inadequate number of viral copies (<131 copies/mL). A negative result must be combined with clinical observations, patient history, and epidemiological information. The expected result is Negative.  Fact Sheet for Patients:  PinkCheek.be  Fact Sheet for Healthcare Providers:  GravelBags.it  This test is no t yet approved or cleared by the [place] FDA and  has been authorized for  detection and/or diagnosis of SARS-CoV-2 by FDA under an Emergency Use Authorization (EUA). This EUA will remain  in  effect (meaning this test can be used) for the duration of the COVID-19 declaration under Section 564(b)(1) of the Act, 21 U.S.C. section 360bbb-3(b)(1), unless the authorization is terminated or revoked sooner.     Influenza A by PCR NEGATIVE NEGATIVE Final   Influenza B by PCR NEGATIVE NEGATIVE Final    Comment: (NOTE) The Xpert Xpress SARS-CoV-2/FLU/RSV assay is intended as an aid in  the diagnosis of influenza from Nasopharyngeal swab specimens and  should not be used as a sole basis for treatment. Nasal washings and  aspirates are unacceptable for Xpert Xpress SARS-CoV-2/FLU/RSV  testing.  Fact Sheet for Patients: PinkCheek.be  Fact Sheet for Healthcare Providers: GravelBags.it  This test is not yet approved or cleared by the [place] FDA and  has been authorized for detection and/or diagnosis of SARS-CoV-2 by  FDA under an Emergency Use Authorization (EUA). This EUA will remain  in effect (meaning this test can be used) for the duration of the  Covid-19 declaration under Section 564(b)(1) of the Act, 21  U.S.C. section 360bbb-3(b)(1), unless the authorization is  terminated or revoked. Performed at [facility], [place] [address] [place], [place] [address]   Blood culture (routine x 2)     Status: None   Collection Time: 11/04/19  1:57 AM   Specimen: BLOOD  Result Value Ref Range Status   Specimen Description   Final    BLOOD LEFT ANTECUBITAL Performed at [facility] [address], [place], [place] [address]    Special Requests   Final    BOTTLES DRAWN AEROBIC AND ANAEROBIC Blood Culture results may not be optimal due to an excessive volume of blood received in culture bottles Performed at [facility] [address],  [place], [place] [address]    Culture   Final    NO GROWTH 5 DAYS Performed at [facility], [place] [address], [place], [place] [address]    Report Status 11/09/2019 FINAL  Final  Blood culture (routine x 2)     Status: None   Collection Time: 11/04/19  9:36 AM   Specimen: BLOOD LEFT HAND  Result Value Ref Range Status   Specimen Description   Final    BLOOD LEFT HAND Performed at [facility] [address], [place], [place] [address]    Special Requests   Final    BOTTLES DRAWN AEROBIC AND ANAEROBIC Blood Culture results may not be optimal due to an inadequate volume of blood received in culture bottles Performed at [facility] [address] [place], [place] [address]    Culture   Final    NO GROWTH 5 DAYS Performed at [facility], [place] [address] [place], [place] [address]    Report Status 11/09/2019 FINAL  Final  Surgical pcr screen     Status: None   Collection Time: 11/06/19  4:00 PM   Specimen: Nasal Mucosa; Nasal Swab  Result Value Ref Range Status   MRSA, PCR NEGATIVE NEGATIVE Final   Staphylococcus aureus NEGATIVE NEGATIVE Final    Comment: (NOTE) The Xpert SA Assay (FDA approved for NASAL specimens in patients 38 years of age and older), is one component of a comprehensive surveillance program. It is not intended to diagnose infection nor to guide or monitor treatment. Performed at [facility], [place] [address] [place], [place] [address]          Radiology Studies: DG Ribs Bilateral W/Chest  Result Date: 11/08/2019 CLINICAL DATA:  Fall, rib pain EXAM: BILATERAL RIBS AND CHEST - 4+ VIEW COMPARISON:  CT chest, 06/02/2019 FINDINGS: No acute fracture or other bone lesions are seen involving the ribs. There is a chronic fracture of the distal left seventh rib, seen on prior CT. There is no evidence of pneumothorax or pleural effusion. Both lungs are clear. Heart size and mediastinal contours are  within normal limits. IMPRESSION: No acute rib fracture. Chronic fracture of the distal left seventh rib. Electronically Signed   By: Eddie Candle M.D.   On: 11/08/2019 16:43   DG Humerus Right  Result Date: 11/07/2019 CLINICAL DATA:  Right humerus ORIF EXAM: RIGHT HUMERUS - 2+ VIEW COMPARISON:  11/06/2019 FINDINGS: 3 C-arm fluoroscopic images were obtained intraoperatively and submitted for post operative interpretation. Interval placement of lateral sideplate and screw fixation construct traversing right humeral diaphyseal fracture. Near anatomic alignment. 11.8 seconds fluoroscopy time utilized. Please see the performing provider's procedural report for further detail. IMPRESSION: As above. Electronically Signed   By: Davina Poke D.O.   On: 11/07/2019 15:02   DG C-Arm 1-60 Min-No Report  Result Date: 11/07/2019 Fluoroscopy was utilized by the requesting physician.  No radiographic interpretation.   DG HIP UNILAT WITH PELVIS 2-3 VIEWS LEFT  Result Date: 11/08/2019 CLINICAL DATA:  Left hip pain. EXAM: DG HIP (WITH OR WITHOUT PELVIS) 2-3V LEFT COMPARISON:  None. FINDINGS: There is no evidence of hip fracture or dislocation. There is no evidence of arthropathy or other focal bone abnormality. IMPRESSION: Negative. Electronically Signed   By: Marijo Conception M.D.   On: 11/08/2019 16:44   DG FEMUR MIN 2 VIEWS LEFT  Result Date: 11/08/2019 CLINICAL DATA:  Fall, pain EXAM: LEFT FEMUR 2 VIEWS COMPARISON:  None. FINDINGS: Osteopenia. No fracture or dislocation of the left femur. Vascular calcinosis. IMPRESSION: No fracture or dislocation of the left femur. Electronically Signed   By: Eddie Candle M.D.   On: 11/08/2019 16:41        Scheduled Meds: . calcium carbonate  625 mg Oral Q breakfast  . cholecalciferol  5,000 Units Oral Daily  . docusate sodium  100 mg Oral BID  . FLUoxetine  10 mg Oral Daily  . magnesium gluconate  500 mg Oral Daily  . pravastatin  40 mg Oral Daily  . zinc sulfate   220 mg Oral Daily   Continuous Infusions:    LOS: 5 days    Time spent: 35 minutes    Irine Seal, MD [facility]   To contact the attending provider between 7A-7P or the covering provider during after hours 7P-7A, please log into the web site [email or web address] and access using universal [place] password for that web site. If you do not have the password, please call the hospital operator.  11/09/2019, 12:59 PM

## 2019-11-09 NOTE — Progress Notes (Signed)
Subjective: 2 Days Post-Op Procedure(s) (LRB): OPEN REDUCTION INTERNAL FIXATION (ORIF) DISTAL HUMERUS FRACTURE (Right)   Patient reports pain as moderate, complaining of more swelling today when compared to yesterday.                                                                                                                                                                                                                                                                                                                                                                                Objective:   VITALS:   Vitals:   11/08/19 2006 11/09/19 0238  BP: (!) 132/46 (!) 143/59  Pulse:  67  Resp:  18  Temp: 98.5 F (36.9 C) 98 F (36.7 C)  SpO2: 92% 91%    Neurovascular intact Incision: dressing C/D/I No cellulitis present Compartment soft  LABS Recent Labs    11/07/19 0441 11/08/19 0621 11/09/19 0612  HGB 14.0 13.6 15.6  HCT 41.7 40.9 45.5  WBC 10.6* 11.2* 12.7*  PLT 198 220 241    Recent Labs    11/07/19 0441 11/08/19 0621 11/09/19 0612  NA 136 135 140  K 4.2 4.8 4.3  BUN 17 15 20   CREATININE 1.01 0.74 1.02  GLUCOSE 121* 129* 77     Assessment/Plan: 2 Days Post-Op Procedure(s) (LRB): OPEN REDUCTION INTERNAL FIXATION (ORIF) DISTAL HUMERUS FRACTURE (Right)  Ice to the right shoulder/humerus Up with therapy  Patient should start working with therapy today, should remain in sling at all times.        Danae Orleans PA-C  [facility]  [facility] [address] [place], [place], [place] [address] Phone: [phone] [email or web address] Facebook  Instagram  LinkedIn  [employer]

## 2019-11-09 NOTE — Progress Notes (Addendum)
Patient bed alarm alarming. This nurse entered room to find patient at foot of bed sitting between bottom rail and foot board with his oxygen out. NEW expiratory wheezes noted and 76% on RA. Oxygen reapplied. Pt stated he was going to the bathroom to pee. Provided pt w urinal but he was unable to use in the sitting position. Once oxygen sats recovered allowed pt to stand w my assistance to use urinal. Pt unable to use urinal still and partially urinated on floor and started to lean backwards and lose balance. Patient was guided back to bed by myself. Pt unable to lay straight in bed and did require positioning in the bed.  Patient currently not complaining of pain, pt is back to sleep, NAD, oxygen 90% on 2 L nasal cannula. Bed alarm, bed rails, fall socks on and door open.    Pt later in evening found incont. Urine, oxygen off. Patient cleaned/ linens changed. Pt up to chair, oxygen on, snack provided (from yesterdays lab work pt appears hypoglycemia early AM). Patient did better with mobility at this time 1 assist w walker.

## 2019-11-10 ENCOUNTER — Inpatient Hospital Stay (HOSPITAL_COMMUNITY): Payer: Medicare Other

## 2019-11-10 ENCOUNTER — Encounter (HOSPITAL_COMMUNITY): Payer: Self-pay | Admitting: Internal Medicine

## 2019-11-10 DIAGNOSIS — R0902 Hypoxemia: Secondary | ICD-10-CM

## 2019-11-10 DIAGNOSIS — I2699 Other pulmonary embolism without acute cor pulmonale: Secondary | ICD-10-CM | POA: Clinically undetermined

## 2019-11-10 LAB — BLOOD GAS, ARTERIAL
Acid-Base Excess: 7.4 mmol/L — ABNORMAL HIGH (ref 0.0–2.0)
Bicarbonate: 33.9 mmol/L — ABNORMAL HIGH (ref 20.0–28.0)
Drawn by: 29503
O2 Saturation: 90.5 %
Patient temperature: 98.6
pCO2 arterial: 58 mmHg — ABNORMAL HIGH (ref 32.0–48.0)
pH, Arterial: 7.385 (ref 7.350–7.450)
pO2, Arterial: 66.9 mmHg — ABNORMAL LOW (ref 83.0–108.0)

## 2019-11-10 LAB — BASIC METABOLIC PANEL
Anion gap: 9 (ref 5–15)
BUN: 19 mg/dL (ref 8–23)
CO2: 30 mmol/L (ref 22–32)
Calcium: 8.3 mg/dL — ABNORMAL LOW (ref 8.9–10.3)
Chloride: 96 mmol/L — ABNORMAL LOW (ref 98–111)
Creatinine, Ser: 0.85 mg/dL (ref 0.61–1.24)
GFR calc Af Amer: >60 mL/min (ref >60)
GFR calc non Af Amer: >60 mL/min (ref >60)
Glucose, Bld: 93 mg/dL (ref 70–99)
Potassium: 4.2 mmol/L (ref 3.5–5.1)
Sodium: 135 mmol/L (ref 135–145)

## 2019-11-10 LAB — CBC WITH DIFFERENTIAL/PLATELET
Abs Immature Granulocytes: 0.03 10*3/uL (ref 0.00–0.07)
Basophils Absolute: 0 10*3/uL (ref 0.0–0.1)
Basophils Relative: 1 %
Eosinophils Absolute: 0.2 10*3/uL (ref 0.0–0.5)
Eosinophils Relative: 3 %
HCT: 38.5 % — ABNORMAL LOW (ref 39.0–52.0)
Hemoglobin: 13.1 g/dL (ref 13.0–17.0)
Immature Granulocytes: 0 %
Lymphocytes Relative: 18 %
Lymphs Abs: 1.5 10*3/uL (ref 0.7–4.0)
MCH: 36.5 pg — ABNORMAL HIGH (ref 26.0–34.0)
MCHC: 34 g/dL (ref 30.0–36.0)
MCV: 107.2 fL — ABNORMAL HIGH (ref 80.0–100.0)
Monocytes Absolute: 1.3 10*3/uL — ABNORMAL HIGH (ref 0.1–1.0)
Monocytes Relative: 16 %
Neutro Abs: 5.1 10*3/uL (ref 1.7–7.7)
Neutrophils Relative %: 62 %
Platelets: 265 10*3/uL (ref 150–400)
RBC: 3.59 MIL/uL — ABNORMAL LOW (ref 4.22–5.81)
RDW: 12.4 % (ref 11.5–15.5)
WBC: 8.3 10*3/uL (ref 4.0–10.5)
nRBC: 0 % (ref 0.0–0.2)

## 2019-11-10 LAB — D-DIMER, QUANTITATIVE: D-Dimer, Quant: 5.16 ug/mL-FEU — ABNORMAL HIGH (ref 0.00–0.50)

## 2019-11-10 LAB — BRAIN NATRIURETIC PEPTIDE: B Natriuretic Peptide: 79.9 pg/mL (ref 0.0–100.0)

## 2019-11-10 IMAGING — CT CT ANGIO CHEST
2 of 6 series · 18 of 36 positions shown · IV contrast (omnipaque)
Comparison: Plain film from earlier in the same day.

CLINICAL DATA: Chest pain and shortness of breath

EXAM:
CT ANGIOGRAPHY CHEST WITH CONTRAST
TECHNIQUE: Multidetector CT imaging of the chest was performed using the
standard protocol during bolus administration of intravenous
contrast. Multiplanar CT image reconstructions and MIPs were
obtained to evaluate the vascular anatomy.
CONTRAST:  100mL OMNIPAQUE IOHEXOL 350 MG/ML SOLN

[Series 5: thins · axial · 0.68mm/px · z∈[-278,-52]mm · 17 of 256 slices shown]
[im 15/256  lung]
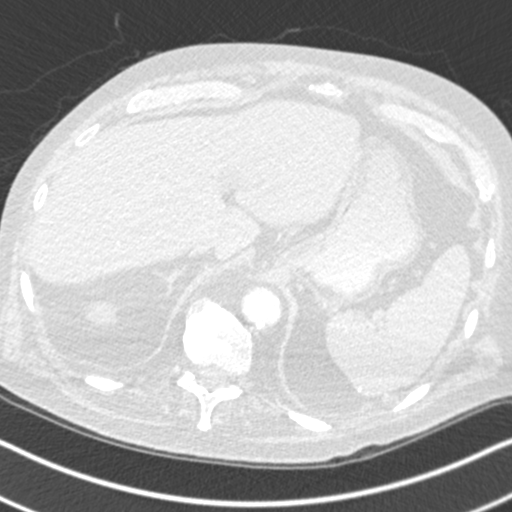
[im 29/256  mediastinal]
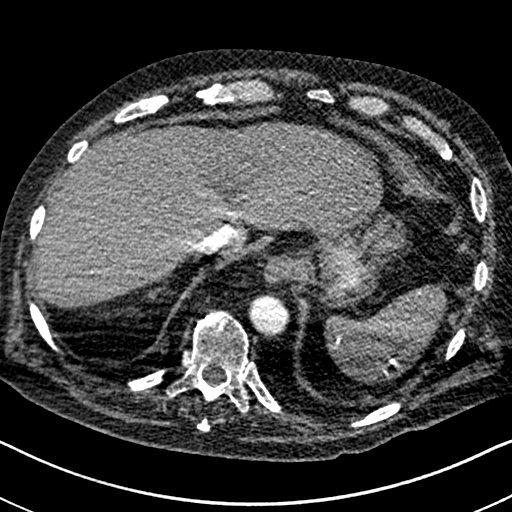
[im 43/256  lung]
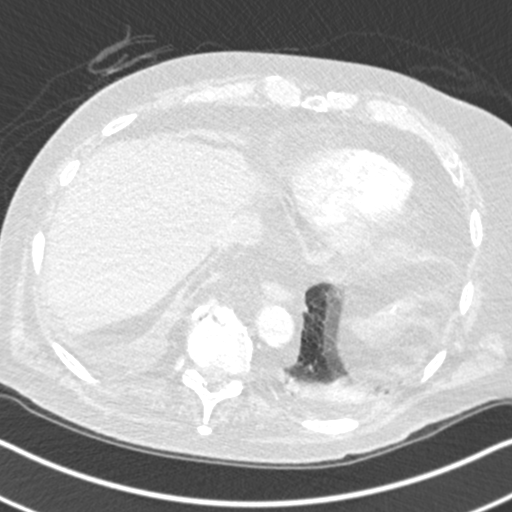
[im 57/256  mediastinal]
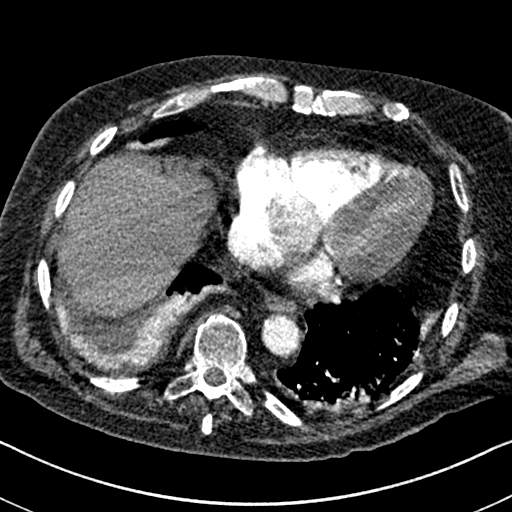
[im 71/256  lung]
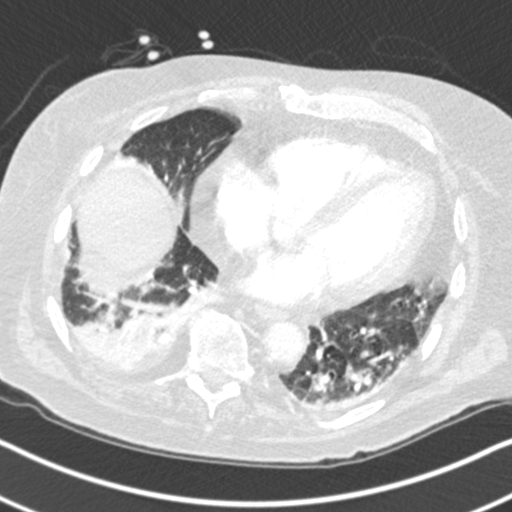
[im 86/256  mediastinal]
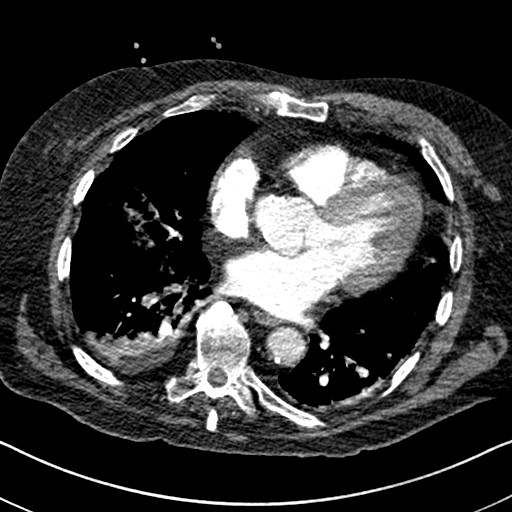
[im 100/256  lung]
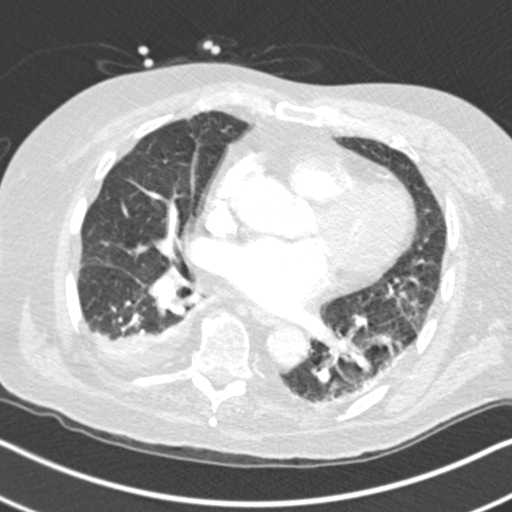
[im 114/256  mediastinal]
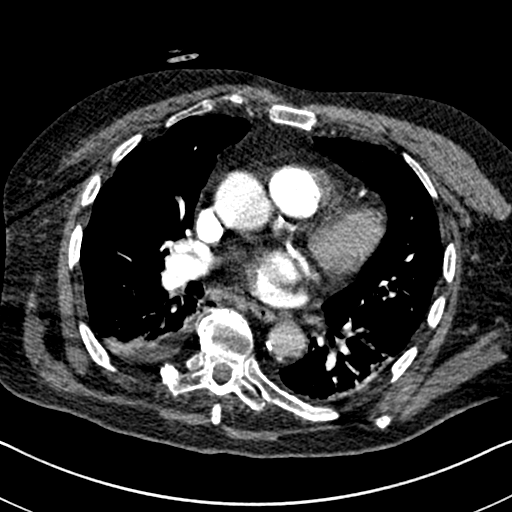
[im 128/256  lung]
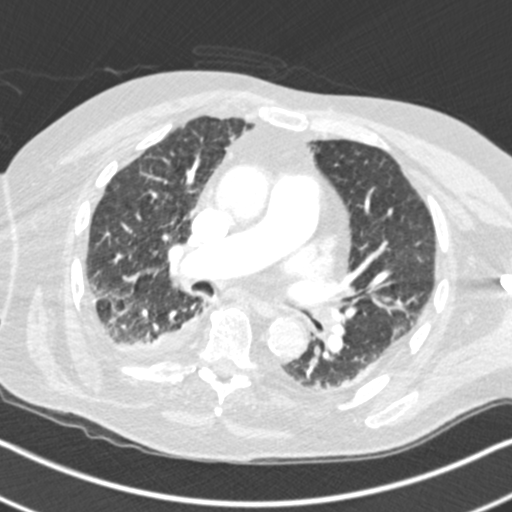
[im 142/256  mediastinal]
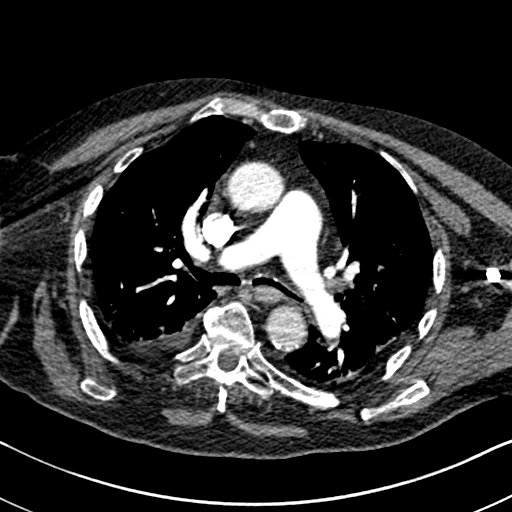
[im 156/256  lung]
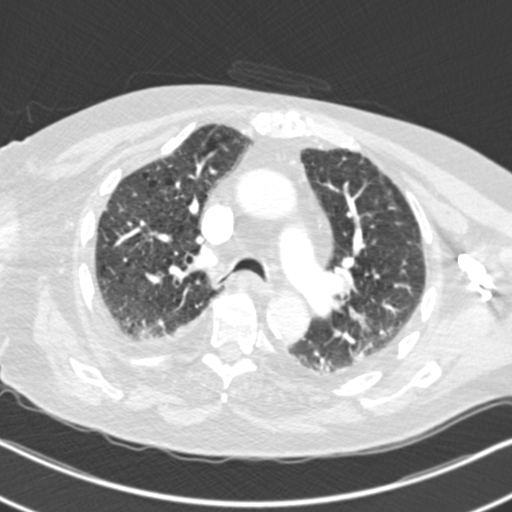
[im 171/256  mediastinal]
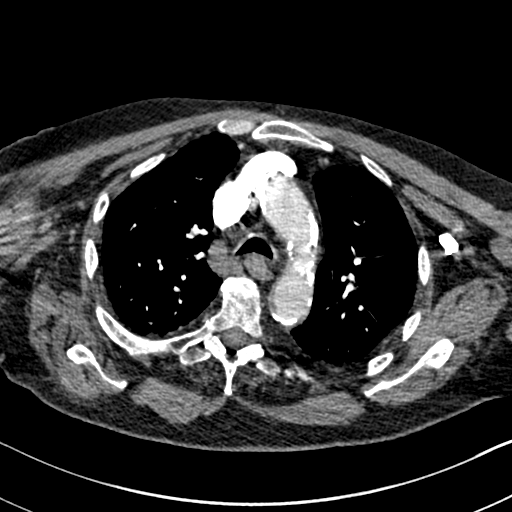
[im 185/256  lung]
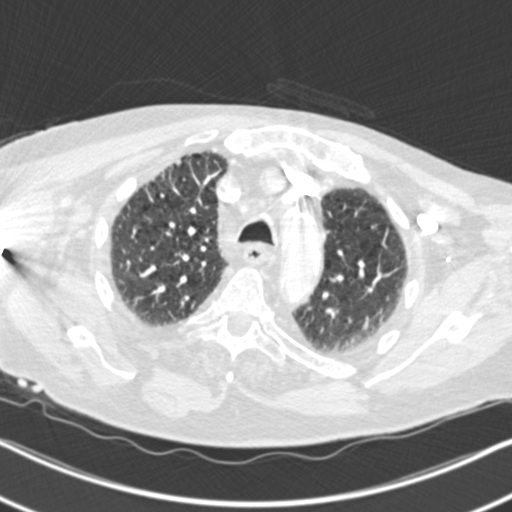
[im 199/256  mediastinal]
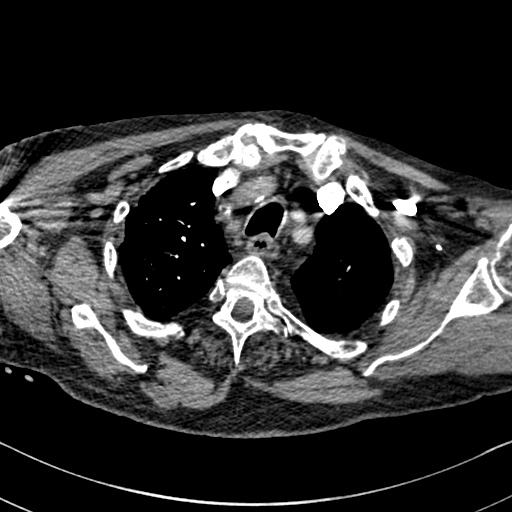
[im 213/256  lung]
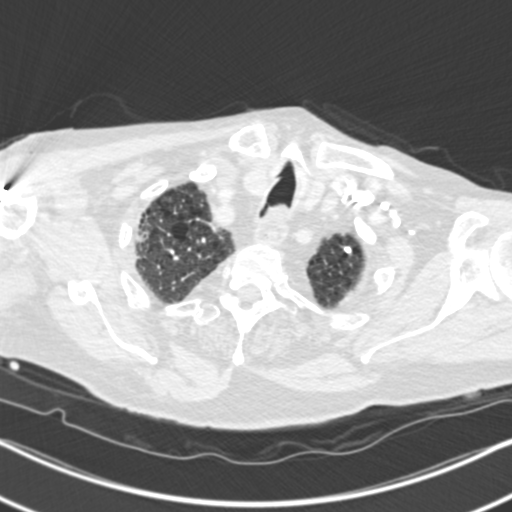
[im 227/256  mediastinal]
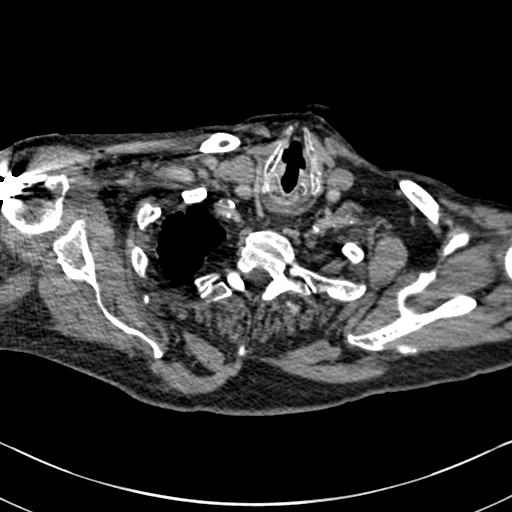
[im 241/256  lung]
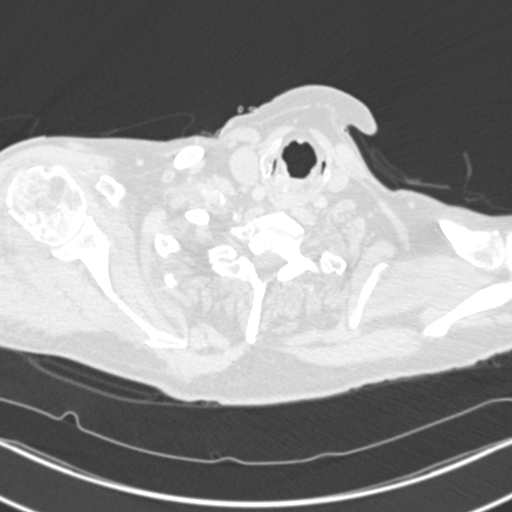

[Series 7: coronal mpr · coronal · 0.52mm/px · 1 of 139 slices shown]
[im 70/139  mediastinal]
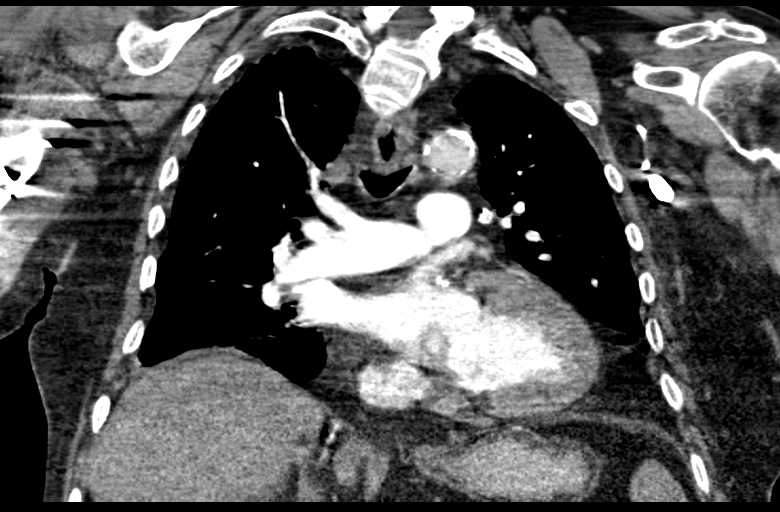

[18 of 36 positions shown; findings below may reference images not displayed]

FINDINGS: Cardiovascular: Thoracic aorta demonstrates atherosclerotic
calcifications without aneurysmal dilatation or dissection.
Pulmonary artery is well visualized within normal branching pattern.
Filling defects are noted within the lower lobe pulmonary arteries
on the right consistent with acute pulmonary emboli. Coronary
calcifications are noted. Mild cardiac enlargement is seen.

Mediastinum/Nodes: Thoracic inlet is within normal limits. Scattered
likely reactive mediastinal adenopathy is noted. The esophagus is
within normal limits.

Lungs/Pleura: Right-sided pleural effusion is noted. Right basilar
consolidation is noted consistent with the known lower lobe
pulmonary emboli. Of emphysematous changes are seen.

Upper Abdomen: Visualized upper abdomen demonstrates multiple
calcified granulomas in the spleen.

Musculoskeletal: Degenerative changes of the thoracic spine are
noted. No acute bony abnormality is seen.

Review of the MIP images confirms the above findings.
IMPRESSION: Right lower lobe pulmonary emboli with associated effusion and
consolidation. No right heart strain is noted

Changes of prior granulomatous disease.

## 2019-11-10 IMAGING — DX DG CHEST 1V PORT
1 series · 1 of 1 positions shown · non-contrast
Comparison: Two days ago

CLINICAL DATA: Hypoxia

EXAM:
PORTABLE CHEST 1 VIEW

[chest ap]
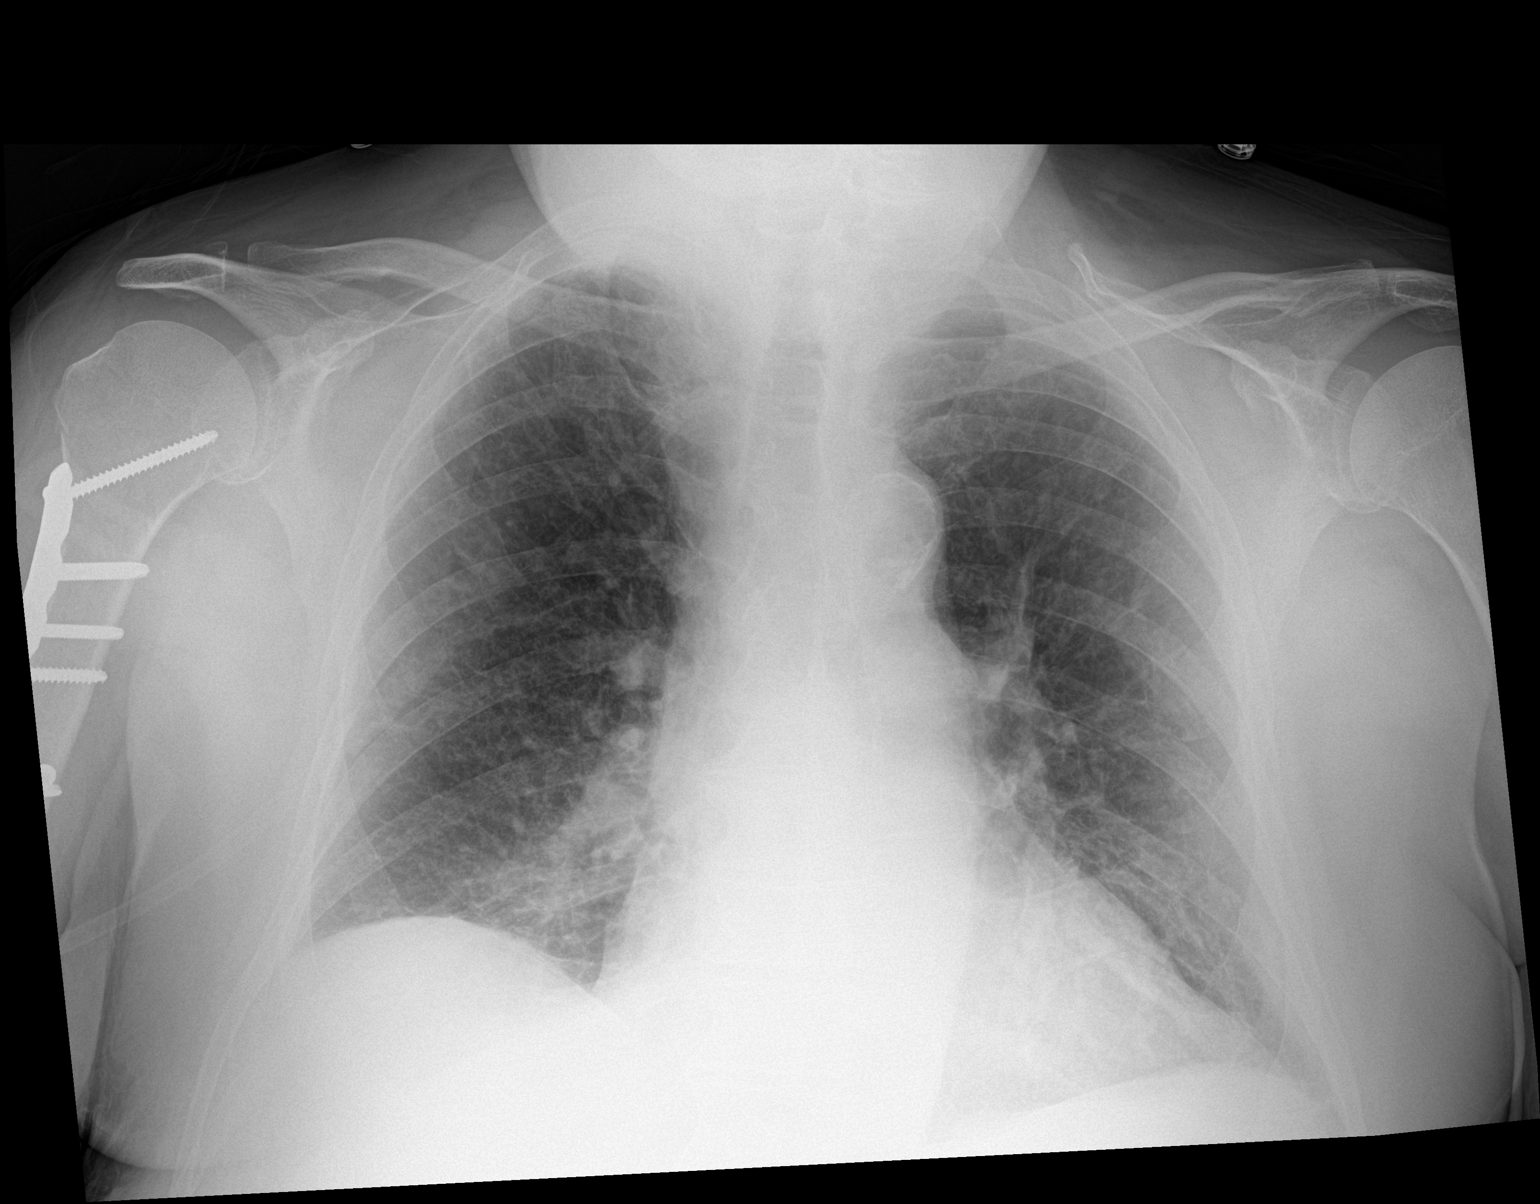

[1 of 1 positions shown; findings below may reference images not displayed]

FINDINGS: Generalized interstitial coarsening without Kerley lines, likely
bronchitic. No effusion or pneumothorax. No focal consolidation,
right infrahilar density is likely summation shadows based on prior.
Borderline heart size. Atherosclerotic calcification.
IMPRESSION: Bronchitic markings without definite pneumonia.

## 2019-11-10 MED ORDER — PANTOPRAZOLE SODIUM 40 MG PO TBEC
40.0000 mg | DELAYED_RELEASE_TABLET | Freq: Every day | ORAL | Status: DC
  Administered 2019-11-10 – 2019-11-14 (×5): 40 mg via ORAL
  Filled 2019-11-10 (×5): qty 1

## 2019-11-10 MED ORDER — SORBITOL 70 % SOLN
30.0000 mL | Status: AC
  Administered 2019-11-10 (×2): 30 mL via ORAL
  Filled 2019-11-10 (×2): qty 30

## 2019-11-10 MED ORDER — METHYLPREDNISOLONE SODIUM SUCC 125 MG IJ SOLR
60.0000 mg | Freq: Two times a day (BID) | INTRAMUSCULAR | Status: DC
  Administered 2019-11-10 – 2019-11-11 (×2): 60 mg via INTRAVENOUS
  Filled 2019-11-10 (×2): qty 2

## 2019-11-10 MED ORDER — OXYCODONE-ACETAMINOPHEN 5-325 MG PO TABS: 1.0000 | ORAL_TABLET | ORAL | Status: DC | PRN

## 2019-11-10 MED ORDER — IPRATROPIUM-ALBUTEROL 0.5-2.5 (3) MG/3ML IN SOLN
3.0000 mL | Freq: Three times a day (TID) | RESPIRATORY_TRACT | Status: DC
  Administered 2019-11-10 – 2019-11-12 (×5): 3 mL via RESPIRATORY_TRACT
  Filled 2019-11-10 (×5): qty 3

## 2019-11-10 MED ORDER — LORATADINE 10 MG PO TABS
10.0000 mg | ORAL_TABLET | Freq: Every day | ORAL | Status: DC
  Administered 2019-11-10 – 2019-11-13 (×4): 10 mg via ORAL
  Filled 2019-11-10 (×5): qty 1

## 2019-11-10 MED ORDER — BUDESONIDE 0.5 MG/2ML IN SUSP
0.5000 mg | Freq: Two times a day (BID) | RESPIRATORY_TRACT | Status: DC
  Administered 2019-11-10 – 2019-11-13 (×5): 0.5 mg via RESPIRATORY_TRACT
  Filled 2019-11-10 (×8): qty 2

## 2019-11-10 MED ORDER — KETOROLAC TROMETHAMINE 30 MG/ML IJ SOLN
15.0000 mg | Freq: Four times a day (QID) | INTRAMUSCULAR | Status: DC | PRN
  Filled 2019-11-10: qty 1

## 2019-11-10 MED ORDER — MORPHINE SULFATE (PF) 2 MG/ML IV SOLN: 1.0000 mg | Freq: Four times a day (QID) | INTRAVENOUS | Status: DC | PRN

## 2019-11-10 MED ORDER — IPRATROPIUM-ALBUTEROL 0.5-2.5 (3) MG/3ML IN SOLN
3.0000 mL | Freq: Four times a day (QID) | RESPIRATORY_TRACT | Status: DC
  Administered 2019-11-10: 3 mL via RESPIRATORY_TRACT
  Filled 2019-11-10: qty 3

## 2019-11-10 MED ORDER — GUAIFENESIN ER 600 MG PO TB12
1200.0000 mg | ORAL_TABLET | Freq: Two times a day (BID) | ORAL | Status: DC
  Administered 2019-11-10 – 2019-11-14 (×9): 1200 mg via ORAL
  Filled 2019-11-10 (×9): qty 2

## 2019-11-10 MED ORDER — IOHEXOL 350 MG/ML SOLN
100.0000 mL | Freq: Once | INTRAVENOUS | Status: AC | PRN
  Administered 2019-11-10: 100 mL via INTRAVENOUS

## 2019-11-10 MED ORDER — FLUTICASONE PROPIONATE 50 MCG/ACT NA SUSP
2.0000 | Freq: Every day | NASAL | Status: DC
  Administered 2019-11-10 – 2019-11-11 (×2): 2 via NASAL
  Filled 2019-11-10: qty 16

## 2019-11-10 MED ORDER — HEPARIN (PORCINE) 25000 UT/250ML-% IV SOLN
1400.0000 [IU]/h | INTRAVENOUS | Status: DC
  Administered 2019-11-10 – 2019-11-12 (×3): 1400 [IU]/h via INTRAVENOUS
  Filled 2019-11-10 (×3): qty 250

## 2019-11-10 MED ORDER — POLYETHYLENE GLYCOL 3350 17 G PO PACK
17.0000 g | PACK | Freq: Every day | ORAL | Status: DC
  Administered 2019-11-10 – 2019-11-11 (×2): 17 g via ORAL
  Filled 2019-11-10 (×5): qty 1

## 2019-11-10 MED ORDER — HEPARIN BOLUS VIA INFUSION
2000.0000 [IU] | Freq: Once | INTRAVENOUS | Status: AC
  Administered 2019-11-10: 2000 [IU] via INTRAVENOUS
  Filled 2019-11-10: qty 2000

## 2019-11-10 MED ORDER — OXYCODONE-ACETAMINOPHEN 5-325 MG PO TABS: 1.0000 | ORAL_TABLET | Freq: Four times a day (QID) | ORAL | Status: DC | PRN

## 2019-11-10 MED ORDER — FUROSEMIDE 10 MG/ML IJ SOLN
20.0000 mg | Freq: Once | INTRAMUSCULAR | Status: AC
  Administered 2019-11-10: 20 mg via INTRAVENOUS
  Filled 2019-11-10: qty 2

## 2019-11-10 NOTE — Progress Notes (Signed)
Occupational Therapy Treatment Patient Details Name: Michael Ware MRN: [id number] DOB: 1931/03/07 Today's Date: 11/10/2019    History of present illness 86 yo male admitted with R midshaft humerus fx after falling at home. Pt underwent ORIF of RT humeral fracture on 11/07/19.  Please refer to new OT shoulder precautions/restrictions now s/p ORIF 11/07/19. Hx of bradycardia   OT comments  pts RUE with significant edema. RN aware.  OT worked with pt on ROM as well as edema control.  Pt with noted blisters on forearm.  Follow Up Recommendations  SNF;Supervision/Assistance - 24 hour          Precautions / Restrictions Precautions Precautions: Fall;Shoulder Type of Shoulder Precautions: Ordered10/02/21 0700: OT eval and treat PROM 10 ER, 45 ABD,60 FE , PASSIVE ROM FOR ADL's ONLY, NOT for EXERCISES. OK to exercise elbow wrist and hand rom and for edema control. No pendulums, may allow arm to dangle Pt may shower. Shoulder Interventions: Shoulder sling/immobilizer;Off for dressing/bathing/exercises;At all times Required Braces or Orthoses: Sling Other Brace: n/a Restrictions Weight Bearing Restrictions: Yes RUE Weight Bearing: Non weight bearing       Mobility Bed Mobility Overal bed mobility: Needs Assistance Bed Mobility: Supine to Sit     Supine to sit: Max assist     General bed mobility comments: pt in chair  Transfers Overall transfer level: Needs assistance Equipment used: None;2 person hand held assist (caution R UE in sling) Transfers: Sit to/from [facility] Sit to Stand: Mod assist Stand pivot transfers: Mod assist;Max assist       General transfer comment: required increased assist due to sleepy/groggy state.  Assisted OOB to amb to bathroom then assisted with a toilet transfer.    Balance                                           ADL either performed or assessed with clinical judgement                  Cognition  Arousal/Alertness: Awake/alert Behavior During Therapy: WFL for tasks assessed/performed Overall Cognitive Status: Within Functional Limits for tasks assessed                                 General Comments: AxO x 1 following all commands but mainly sleepy/groggy/drunk        Exercises Hand Exercises Forearm Supination: PROM;10 reps;Seated;Right Forearm Pronation: PROM;Seated;10 reps;Right Wrist Flexion: AAROM;10 reps;Seated;Right Wrist Extension: AAROM;10 reps;Seated;Right Digit Composite Flexion: AAROM;10 reps;Right;Seated Composite Extension: AAROM;Right;10 reps;Seated   Shoulder Instructions            Pertinent Vitals/ Pain       Pain Assessment: Faces Faces Pain Scale: Hurts little more Pain Location: pt sleepy/groggy Pain Descriptors / Indicators: Aching;Grimacing;Guarding;Sore Pain Intervention(s): Limited activity within patient's tolerance;Monitored during session         Frequency  Min 2X/week        Progress Toward Goals  OT Goals(current goals can now be found in the care plan section)  Progress towards OT goals: Progressing toward goals     Plan Discharge plan remains appropriate       AM-PAC OT "6 Clicks" Daily Activity     Outcome Measure   Help from another person eating meals?: A Little Help from another person taking care of  personal grooming?: A Little Help from another person toileting, which includes using toliet, bedpan, or urinal?: A Lot Help from another person bathing (including washing, rinsing, drying)?: A Lot Help from another person to put on and taking off regular upper body clothing?: A Lot Help from another person to put on and taking off regular lower body clothing?: A Lot 6 Click Score: 14    End of Session    OT Visit Diagnosis: Unsteadiness on feet (R26.81);History of falling (Z91.81);Pain Pain - part of body: Shoulder   Activity Tolerance Patient tolerated treatment well;Patient limited by pain    Patient Left in chair;with call bell/phone within reach;with chair alarm set   Nurse Communication Mobility status        Time: 3154-0086 OT Time Calculation (min): 23 min  Charges: OT General Charges $OT Visit: 1 Visit OT Treatments $Self Care/Home Management : 23-37 mins  Kari Baars, [place] [phone] Office- [phone]      [place], Edwena Felty D 11/10/2019, 3:02 PM

## 2019-11-10 NOTE — Progress Notes (Signed)
Michael Ware  MRN: [id number] DOB/Age: December 23, 1931 86 y.o. Physician: Ander Slade, Michael.D. 3 Days Post-Op Procedure(s) (LRB): OPEN REDUCTION INTERNAL FIXATION (ORIF) DISTAL HUMERUS FRACTURE (Right)  Subjective: Patient sitting at bedside eating lunch, reports good appetite.  Denies nausea and vomiting.  Reports minimal right arm pain. Vital Signs Temp:  [98 F (36.7 C)-98.6 F (37 C)] 98.1 F (36.7 C) (10/04 0459) Pulse Rate:  [63-73] 68 (10/04 1230) Resp:  [17-18] 18 (10/04 0459) BP: (124-147)/(48-92) 147/92 (10/04 1230) SpO2:  [83 %-93 %] 83 % (10/04 1230) Weight:  [96.6 kg] 96.6 kg (10/04 0541)  Lab Results Recent Labs    11/09/19 0612 11/10/19 0838  WBC 12.7* 8.3  HGB 15.6 13.1  HCT 45.5 38.5*  PLT 241 265   BMET Recent Labs    11/09/19 0612 11/10/19 0838  NA 140 135  K 4.3 4.2  CL 99 96*  CO2 30 30  GLUCOSE 77 93  BUN 20 19  CREATININE 1.02 0.85  CALCIUM 8.9 8.3*   INR  Date Value Ref Range Status  01/19/2013 1.01 0.0 - 1.4 Final     Exam  Aquacel dressing intact to right upper extremity.  Compartments are all soft.  He is neurovascular intact in the right upper extremity.  Significant swelling persists in the forearm wrist and hand  Impression:  Status post ORIF severely displaced right humeral midshaft fracture.  Significant edema persists in the right upper extremity.  Plan Patient encouraged to begin active range of motion of the elbow wrist and hand.  May also perform pendulum exercises.  Request that occupational therapy and nursing staff assist with edema reduction techniques and exercises to the right upper extremity.  He may use his right arm for light activities of daily living, including eating and dressing as his symptoms allow, but avoid weightbearing on the right arm.  We will plan for follow-up in my office approximately 2 weeks postop.  We will continue to follow as an inpatient. Michael Ware Michael Ware 11/10/2019, 12:36 PM   Contact #  [phone]

## 2019-11-10 NOTE — Progress Notes (Signed)
Physical Therapy Treatment Patient Details Name: Michael Ware MRN: [id number] DOB: September 20, 1931 Today's Date: 11/10/2019    History of Present Illness 86 yo male admitted with R midshaft humerus fx after falling at home. Pt underwent ORIF of RT humeral fracture on 11/07/19.  Please refer to new OT shoulder precautions/restrictions now s/p ORIF 11/07/19. Hx of bradycardia    PT Comments    POD # 3  Pt in bed with daughter in room.  On 2 lts nasal at 90%.  General Comments: AxO x 1 following all commands but mainly sleepy/groggy/drunk.  Pleasant.  Shared the  he is a retired [employer] examiner.  Assisted OOB.  General bed mobility comments: MAX Assist for upper body with R UE in sling as well as use of bed pad to complete swival/scooting to EOB and Min Assist with lower body.  General transfer comment: required increased assist due to sleepy/groggy state.  Assisted OOB to amb to bathroom then assisted with a toilet transfer. Pt c/o dizziness while seated on toilet.  BP 147/92 and RA 83%.  Suspect pain meds.  General Gait Details: pt typically amb flexed forward due to his spinal stenosis (stated daughter) required increased assist this session.  Assisted to and from bathroom only.  Pt was weezy getting back to recliner.  Very unsteady shuffled steps with HIGH ANTERIOR FALL RISK.  Required increased VC's for safety with turns.  Positioned in recliner.  Encouraged incentive spirometer.  Very low inhalation volume. Reapplied 2 lts nasal to achieve 90%   Pt also very bloated ABD girth.  No c/o ABD pain but daughter mentioned no BM's   Pt lived home alone and was Indep.  Daughter lives in [place]. Pt will need ST Rehab at SNF.   Follow Up Recommendations  SNF     Equipment Recommendations  None recommended by PT    Recommendations for Other Services       Precautions / Restrictions Precautions Precautions: Fall;Shoulder Type of Shoulder Precautions: Ordered10/02/21 0700: OT eval and treat PROM 10 ER,  45 ABD,60 FE , PASSIVE ROM FOR ADL's ONLY, NOT for EXERCISES. OK to exercise elbow wrist and hand rom and for edema control. No pendulums, may allow arm to dangle Pt may shower. Shoulder Interventions: Shoulder sling/immobilizer;Off for dressing/bathing/exercises;At all times Required Braces or Orthoses: Sling Other Brace: n/a Restrictions Weight Bearing Restrictions: Yes RUE Weight Bearing: Non weight bearing    Mobility  Bed Mobility Overal bed mobility: Needs Assistance Bed Mobility: Supine to Sit     Supine to sit: Max assist     General bed mobility comments: MAX Assist for upper body with R UE in sling as well as use of bed pad to complete swival/scooting to EOB and Min Assist with lower body  Transfers Overall transfer level: Needs assistance Equipment used: None;2 person hand held assist (caution R UE in sling) Transfers: Sit to/from [facility] Sit to Stand: Mod assist Stand pivot transfers: Mod assist;Max assist       General transfer comment: required increased assist due to sleepy/groggy state.  Assisted OOB to amb to bathroom then assisted with a toilet transfer.  Ambulation/Gait Ambulation/Gait assistance: +2 safety/equipment Gait Distance (Feet): 20 Feet Assistive device: None;2 person hand held assist (caution to R UE in sling) Gait Pattern/deviations: Step-to pattern;Decreased step length - right;Decreased step length - left;Shuffle;Trunk flexed Gait velocity: decreased   General Gait Details: pt typically amb flexed forward due to his spinal stenosis (stated daughter) required increased assist  this session.  Assisted to and from bathroom only.  Very unsteady shuffled steps with HIGH ANTERIOR FALL RISK.  Required increased VC's for safety with turns.   Stairs             Wheelchair Mobility    Modified Rankin (Stroke Patients Only)       Balance                                            Cognition  Arousal/Alertness: Awake/alert Behavior During Therapy: WFL for tasks assessed/performed Overall Cognitive Status: Within Functional Limits for tasks assessed                                 General Comments: AxO x 1 following all commands but mainly sleepy/groggy/drunk      Exercises      General Comments        Pertinent Vitals/Pain Pain Assessment: Faces Faces Pain Scale: Hurts a little bit Pain Location: pt sleepy/groggy Pain Descriptors / Indicators: Aching;Grimacing;Guarding    Home Living                      Prior Function            PT Goals (current goals can now be found in the care plan section) Progress towards PT goals: Progressing toward goals    Frequency    Min 3X/week      PT Plan Current plan remains appropriate    Co-evaluation              AM-PAC PT "6 Clicks" Mobility   Outcome Measure  Help needed turning from your back to your side while in a flat bed without using bedrails?: A Lot Help needed moving from lying on your back to sitting on the side of a flat bed without using bedrails?: A Lot Help needed moving to and from a bed to a chair (including a wheelchair)?: A Lot Help needed standing up from a chair using your arms (e.g., wheelchair or bedside chair)?: A Lot Help needed to walk in hospital room?: A Lot Help needed climbing 3-5 steps with a railing? : Total 6 Click Score: 11    End of Session Equipment Utilized During Treatment: Gait belt Activity Tolerance: Other (comment) (groggy/drunk) Patient left: in chair;with call bell/phone within reach;with chair alarm set;with family/visitor present Nurse Communication: Mobility status PT Visit Diagnosis: Muscle weakness (generalized) (M62.81);Pain;History of falling (Z91.81);Unsteadiness on feet (R26.81) Pain - Right/Left: Right Pain - part of body: Shoulder     Time: 3094-0768 PT Time Calculation (min) (ACUTE ONLY): 33 min  Charges:  $Gait  Training: 8-22 mins $Therapeutic Activity: 8-22 mins                     Rica Koyanagi  PTA Acute  Rehabilitation Services Pager      [phone] Office      [phone]

## 2019-11-10 NOTE — Progress Notes (Signed)
SATURATION QUALIFICATIONS: (This note is used to comply with regulatory documentation for home oxygen)  Patient Saturations on Room Air at Rest = 82%  Patient Saturations on Room Air while Ambulating = 78%  Patient Saturations on 2 Liters of oxygen while Ambulating = 90%  Please briefly explain why patient needs home oxygen:

## 2019-11-10 NOTE — Plan of Care (Signed)
  Problem: Coping: Goal: Level of anxiety will decrease Outcome: Progressing   Problem: Health Behavior/Discharge Planning: Goal: Ability to manage health-related needs will improve Outcome: Progressing   Problem: Clinical Measurements: Goal: Ability to maintain clinical measurements within normal limits will improve Outcome: Progressing Goal: Will remain free from infection Outcome: Progressing Goal: Diagnostic test results will improve Outcome: Progressing Goal: Respiratory complications will improve Outcome: Progressing Goal: Cardiovascular complication will be avoided Outcome: Progressing   Problem: Activity: Goal: Risk for activity intolerance will decrease Outcome: Progressing   Problem: Pain Managment: Goal: General experience of comfort will improve Outcome: Progressing   Problem: Safety: Goal: Ability to remain free from injury will improve Outcome: Progressing

## 2019-11-10 NOTE — Care Management Important Message (Signed)
Important Message  Patient Details IM Letter given to the Patient Name: ANSELM AUMILLER MRN: [id number] Date of Birth: 11-12-1931   Medicare Important Message Given:  Yes     Kerin Salen 11/10/2019, 1:35 PM

## 2019-11-10 NOTE — Progress Notes (Signed)
Occupational Therapy Treatment Patient Details Name: Michael Ware MRN: [id number] DOB: 1931-08-12 Today's Date: 11/10/2019    History of present illness 86 yo male admitted with R midshaft humerus fx after falling at home. Pt underwent ORIF of RT humeral fracture on 11/07/19.  Please refer to new OT shoulder precautions/restrictions now s/p ORIF 11/07/19. Hx of bradycardia   OT comments  Pt back in bed but with agreeable to perform R wrist , elbow and fingers.  Edema continues to be significant with blisters on skin.  RN aware    Follow Up Recommendations  SNF;Supervision/Assistance - 24 hour          Precautions / Restrictions Precautions Precautions: Fall;Shoulder Type of Shoulder Precautions: Ordered10/02/21 0700: OT eval and treat PROM 10 ER, 45 ABD,60 FE , PASSIVE ROM FOR ADL's ONLY, NOT for EXERCISES. OK to exercise elbow wrist and hand rom and for edema control. No pendulums, may allow arm to dangle Pt may shower. Shoulder Interventions: Shoulder sling/immobilizer;Off for dressing/bathing/exercises;At all times Required Braces or Orthoses: Sling Other Brace: n/a Restrictions Weight Bearing Restrictions: Yes RUE Weight Bearing: Non weight bearing       Mobility Bed Mobility               General bed mobility comments: pt in chair  Transfers                          ADL either performed or assessed with clinical judgement                  Cognition Arousal/Alertness: Awake/alert Behavior During Therapy: WFL for tasks assessed/performed Overall Cognitive Status: Within Functional Limits for tasks assessed                                          Exercises Hand Exercises Forearm Supination: 10 reps;Seated;Right;AAROM Forearm Pronation: Seated;10 reps;Right;AROM Wrist Flexion: AAROM;10 reps;Seated;Right Wrist Extension: AAROM;10 reps;Seated;Right Digit Composite Flexion: AAROM;10 reps;Right;Seated Composite Extension:  AAROM;Right;10 reps;Seated Digit Composite Abduction: AROM;10 reps;Right Digit Composite Adduction: AROM;Right;10 reps Digit Lifts: AROM;Right;10 reps Thumb Abduction: AROM;Right;10 reps   Shoulder Instructions       General Comments      Pertinent Vitals/ Pain       Pain Assessment: 0-10 Pain Score: 4  Faces Pain Scale: Hurts little more Pain Location: R wrist/ hand and elbowl with AAROM Pain Descriptors / Indicators: Sore Pain Intervention(s): Limited activity within patient's tolerance         Frequency  Min 2X/week        Progress Toward Goals  OT Goals(current goals can now be found in the care plan section)  Progress towards OT goals: Progressing toward goals     Plan Discharge plan remains appropriate       AM-PAC OT "6 Clicks" Daily Activity     Outcome Measure   Help from another person eating meals?: A Little Help from another person taking care of personal grooming?: A Little Help from another person toileting, which includes using toliet, bedpan, or urinal?: A Lot Help from another person bathing (including washing, rinsing, drying)?: A Lot Help from another person to put on and taking off regular upper body clothing?: A Lot Help from another person to put on and taking off regular lower body clothing?: A Lot 6 Click Score: 14    End of Session  OT Visit Diagnosis: Unsteadiness on feet (R26.81);History of falling (Z91.81);Pain Pain - part of body: Arm   Activity Tolerance Patient tolerated treatment well;Patient limited by pain   Patient Left with chair alarm set;in bed   Nurse Communication Mobility status        Time: 1146-4314 OT Time Calculation (min): 21 min  Charges: OT General Charges $OT Visit: 1 Visit OT Treatments $Self Care/Home Management : 23-37 mins $Therapeutic Activity: 8-22 mins  Kari Baars, OT Acute Rehabilitation Services [phone] Office- [phone], Edwena Felty D 11/10/2019,  6:48 PM

## 2019-11-10 NOTE — Progress Notes (Signed)
ANTICOAGULATION CONSULT NOTE - Initial Consult  Pharmacy Consult for Heparin Indication: pulmonary embolus  Allergies  Allergen Reactions  . Atenolol Other (See Comments)    Caused heart rate to drop Other reaction(s): Other Caused heart rate to drop  . Other     Other reaction(s): Other Dry mouth  . Trazamine [Trazodone & Diet Manage Prod] Other (See Comments)    Dry mouth    Patient Measurements: Height: 5\' 8"  (172.7 cm) Weight: 96.6 kg (212 lb 15.4 oz) IBW/kg (Calculated) : 68.4 HEPARIN DW (KG): 87.2   Vital Signs: Temp: 98.3 F (36.8 C) (10/04 1249) Temp Source: Oral (10/04 1249) BP: 122/62 (10/04 1249) Pulse Rate: 64 (10/04 1249)  Labs: Recent Labs    11/08/19 0621 11/08/19 0621 11/09/19 0612 11/10/19 0838  HGB 13.6   < > 15.6 13.1  HCT 40.9  --  45.5 38.5*  PLT 220  --  241 265  CREATININE 0.74  --  1.02 0.85   < > = values in this interval not displayed.    Estimated Creatinine Clearance: 71.6 mL/min (by C-G formula based on SCr of 0.85 mg/dL).   Medical History: Past Medical History:  Diagnosis Date  . Acute GI bleeding 01/19/13   most likely due to diverticulosis  . BPH (benign prostatic hypertrophy) with urinary obstruction   . Bradycardia   . Diverticulosis   . Elevated hemoglobin A1c   . Hyperglycemia   . Hyperlipidemia   . Hypertension   . Internal hemorrhoids with bleeding and fecal soiling 11/19/2014  . Rectal bleed 01/19/13  . Vitamin D deficiency     Medications:  Infusions:   Assessment: 86 yo M admitted with right humerus fracture s/p ORIF of humerus shaft fracture on 11/07/19. CBC- Hg, pltc WNL, DDimer 5.16 No bleeding noted. Chest CT + RLL PE, no right heart strain noted.   Goal of Therapy:  Heparin level 0.3-0.7 units/ml Monitor platelets by anticoagulation protocol: Yes   Plan:  -Heparin 2000 units IV bolus x1 (small bolus since CBC stable) followed by heparin infusion at 1400 units/hr -Check 8h heparin level after  infusion initiated -Daily heparin level & CBC while on heparin -F/U long term anticoagulation plans  Netta Cedars PharmD, BCPS 11/10/2019,7:15 PM

## 2019-11-10 NOTE — Progress Notes (Signed)
PROGRESS NOTE    Michael Ware  WUX:[id number] DOB: [birth year] DOA: 11/04/2019 PCP: Alroy Dust, L.Marlou Sa, MD    Chief Complaint  Patient presents with  . Near Syncope  . Fall    Brief Narrative:  Patient is a 86 year old gentleman prior history of bradycardia, vitamin D deficiency, presented to the ED after mechanical fall.  Patient denies any head injury or loss of consciousness.  When EMS arrived at patient's home patient noted to have a near syncopal episode when they helped him off the floor.  Imaging showed a right humerus fracture.  EKG done was concerning for bradycardia.  Patient also noted to be hypotensive on admission.  Orthopedics and cardiology consulted and following.   Assessment & Plan:   Active Problems:   Humerus fracture   Bradycardia   Near syncope   Depression   Lactic acidosis   AKI (acute kidney injury) (HCC)   Hyperlipidemia   Hypotension   Acute pain of left lower extremity   Rib pain on right side   Dehydration   Hypoxia  1 presyncope Likely secondary to hypotension versus vasovagal secondary to pain from humerus fracture.  Patient noted when EMS arrived to have a near syncopal episode.  Patient noted on admission presentation to be hypotensive and placed on IV fluids.  Repeat orthostatics done 11/05/2019 unremarkable.  2D echo with very technically difficult study with poor echo windows, EF of 70 to 75%, mild left ventricular hypertrophy, grade 1 diastolic dysfunction.  Patient seen in consultation by cardiology who are recommending outpatient event monitor.  IV fluids saline lock.  Patient was placed back on gentle hydration in preparation for surgery on 11/07/2019.  IV fluids have been discontinued.  No further episodes of presyncope.  Supportive care.  2.  Hypoxia Patient noted to be hypoxic overnight with sats of 76% on room air.  Per PT patient noted to be drowsy, with sats of 83% on room air with some associated wheezing.  Patient with prior history of  tobacco use.  Chest x-ray obtained negative for any acute infiltrate or volume overload.  BNP within normal limits.  Check ABG, D-dimer.  Place on Solu-Medrol 60 mg IV every 12 hours, duo nebs, Pulmicort.  Lasix 20 mg IV x1.  Strict I's and O's.  Daily weights.  Discontinue IV morphine.  Discontinue MSIR.  IV Toradol 50 mg every 6 hours as needed severe pain.  Percocet 5/325 1 tablet p.o. every 6 hours as needed pain.  Minimize narcotics.  Follow.   3.  Right humerus fracture Secondary to mechanical fall. Patient seen in consultation by orthopedics who initially well recommending conservative treatment and patient initially placed in a Sarmiento type humerus fracture brace, with repeat x-rays to follow-up on overall alignment.  Repeat x-rays done 11/06/2019 with severe displacement at the fracture site with greater than 200% translation of the shaft per orthopedics.  Orthopedics recommended surgery and patient underwent ORIF of humeral shaft fracture (11/07/2019).  Patient noted to have received a block and initially with no sensation to the right upper extremity.  Patient has regained sensation to the right upper extremity.  Patient stating oxycodone not helping with pain.  Patient placed on IV morphine as well as MSIR 15 mg every 4 hours as needed.  Patient noted to have some drowsiness.  RT and noted to also be hypoxic on room air.  Patient was not on oxygen prior to admission.  Due to concerns for possible respiratory depression and drowsiness discontinue IV morphine  and placed on IV Toradol as needed for severe pain.  Discontinue MSIR and place on Percocet 5/325 1 tab every 6 hours as needed as needed pain.  Tylenol as needed.  PT/OT.  Per orthopedics.  4.  Bradycardia Questionable etiology.  Patient noted to be bradycardic on admission.  2D echo done with a EF of 70 to 75%, no wall motion abnormalities, grade 1 diastolic dysfunction.  Patient noted to be hypotensive on admission which was felt likely could  be from a vasovagal event.  Heart rate improved.  Patient seen in consultation by cardiology who are recommending 30-day event monitor to assess for symptomatic bradycardia arrhythmias with outpatient follow-up with Dr. Rayann Heman in 4 weeks.  Appreciate cardiology input and recommendations.  5.  Hypotension Likely secondary to hypovolemia as patient noted to be hypotensive on admission.  Blood pressure responded to IV fluids.  IV fluids have been saline lock.  Patient with hypoxia with some drowsiness.  Physical therapy and as such minimize narcotics.  Status post ORIF.   6.  Acute kidney injury Resolved with hydration.  Follow.    7.  Leukocytosis Likely reactive leukocytosis.  Chest x-ray with no acute infiltrate.  Urinalysis unremarkable.  Patient currently afebrile.  Leukocytosis has trended down.  No need for antibiotics.  Follow.   8.  Depression Prozac.    9.  Hyperlipidemia Statin.    10.  Lactic acidosis/HAGMA Likely secondary to dehydration/volume depletion in the setting of hypotension.  Urinalysis unremarkable.  Chest x-ray unremarkable.  Patient currently afebrile.  Resolved with hydration.  Anion gap normalized.  Follow.   11.  Right rib pain/left lower extremity pain Patient with complaints of right rib pain and left lower extremity pain on 11/08/2019.Marland Kitchen  Patient stated really did not realize significant pain he had as he was more focused on his right upper extremity pain.  Noted to have fallen prior to admission.  Rib films negative for any fracture.  Plain films of the pelvis, left hip, left femur negative for any acute fracture.  Pain management.  Incentive spirometry.  Supportive care.    DVT prophylaxis: SCDs Code Status: DNR Family Communication: Updated patient.  Updated daughter at bedside.  Disposition:   Status is: Inpatient    Dispo: The patient is from: Home              Anticipated d/c is to: SNF              Anticipated d/c date is: Hopefully in 1 to 2 days.   To skilled nursing facility once hypoxia is resolved and when cleared by orthopedics.              Patient status post ORIF humeral shaft fracture, physical therapy evaluating.  Patient with hypoxia.  Not stable for discharge.        Consultants:   Orthopedics: Jenetta Loges, PA/Dr. Onnie Graham 11/04/2019  Cardiology: Dr. Radford Pax 11/04/2019  Procedures:   Plain films of the right humerus 11/05/2019, 11/04/2019, 11/06/2019  Chest x-ray 11/04/2019  Plain films of the left knee 11/04/2019  2D echo 11/04/2019  ORIF of displaced right humeral shaft fracture per Dr. Onnie Graham 11/07/2019  Rib films 11/08/2019  Plain films of the left hip and pelvis/plain films of the left femur 11/09/2019  Antimicrobials:   None   Subjective: Sitting up in bed.  Denies any shortness of breath or chest pain.  Patient noted to be hypoxic overnight per RN note with wheezing and sats of 76% on  room air which improved on 2 L nasal cannula.  Patient also noted PT to be somewhat drowsy, sats of 83% on room air with some wheeziness.  Patient states pain is controlled.  Patient denies any left lower extremity pain.  Denies any right-sided rib pain.  Daughter at bedside.   Objective: Vitals:   11/10/19 0541 11/10/19 1230 11/10/19 1249 11/10/19 1543  BP:  (!) 147/92 122/62   Pulse: 66 68 64   Resp:   18   Temp:   98.3 F (36.8 C)   TempSrc:   Oral   SpO2: 93% (!) 83% 93% 90%  Weight: 96.6 kg     Height:        Intake/Output Summary (Last 24 hours) at 11/10/2019 1616 Last data filed at 11/10/2019 1516 Gross per 24 hour  Intake --  Output 200 ml  Net -200 ml   Filed Weights   11/06/19 0822 11/07/19 1015 11/10/19 0541  Weight: 97.5 kg 91.2 kg 96.6 kg    Examination:  General exam: NAD Respiratory system: CTA B.  No wheezes, no crackles, no rhonchi.  Normal respiratory effort.  Cardiovascular system: Regular rate rhythm no murmurs rubs or gallops.  No JVD.  No lower extremity edema.  Gastrointestinal system:  Abdomen is soft, nontender, nondistended, positive bowel sounds.  No rebound.  No guarding.  Central nervous system: Alert and oriented.  No focal neurological deficits.  Extremities: Right upper extremity in sling.  Swelling to right upper extremity improved.  Skin: No rashes, lesions or ulcers Psychiatry: Judgement and insight appear normal. Mood & affect appropriate.     Data Reviewed: I have personally reviewed following labs and imaging studies  CBC: Recent Labs  Lab 11/04/19 0113 11/05/19 0453 11/06/19 0425 11/07/19 0441 11/08/19 0621 11/09/19 0612 11/10/19 0838  WBC 18.4*   < > 10.1 10.6* 11.2* 12.7* 8.3  NEUTROABS 14.0*  --   --  7.3 9.4* 8.3* 5.1  HGB 16.0   < > 14.1 14.0 13.6 15.6 13.1  HCT 44.9   < > 41.4 41.7 40.9 45.5 38.5*  MCV 103.7*   < > 107.8* 108.3* 108.5* 107.1* 107.2*  PLT 273   < > 179 198 220 241 265   < > = values in this interval not displayed.    Basic Metabolic Panel: Recent Labs  Lab 11/04/19 0113 11/05/19 0453 11/06/19 0425 11/07/19 0441 11/08/19 0621 11/09/19 0612 11/10/19 0838  NA 139   < > 134* 136 135 140 135  K 3.7   < > 4.0 4.2 4.8 4.3 4.2  CL 100   < > 102 99 100 99 96*  CO2 21*   < > 25 27 28 30 30   GLUCOSE 135*   < > 106* 121* 129* 77 93  BUN 22   < > 16 17 15 20 19   CREATININE 1.38*   < > 1.04 1.01 0.74 1.02 0.85  CALCIUM 9.3   < > 8.2* 8.6* 8.4* 8.9 8.3*  MG 2.5*  --  2.0 2.0  --   --   --    < > = values in this interval not displayed.    GFR: Estimated Creatinine Clearance: 71.6 mL/min (by C-G formula based on SCr of 0.85 mg/dL).  Liver Function Tests: Recent Labs  Lab 11/05/19 0453 11/06/19 0425  AST 52* 58*  ALT 36 45*  ALKPHOS 64 63  BILITOT 1.1 1.5*  PROT 5.4* 5.6*  ALBUMIN 3.1* 3.1*    CBG: Recent  Labs  Lab 11/05/19 1130 11/05/19 1747 11/06/19 0021 11/06/19 0546  GLUCAP 165* 136* 127* 108*     Recent Results (from the past 240 hour(s))  Respiratory Panel by RT PCR (Flu A&B, Covid) -  Nasopharyngeal Swab     Status: None   Collection Time: 11/04/19 12:49 AM   Specimen: Nasopharyngeal Swab  Result Value Ref Range Status   SARS Coronavirus 2 by RT PCR NEGATIVE NEGATIVE Final    Comment: (NOTE) SARS-CoV-2 target nucleic acids are NOT DETECTED.  The SARS-CoV-2 RNA is generally detectable in upper respiratoy specimens during the acute phase of infection. The lowest concentration of SARS-CoV-2 viral copies this assay can detect is 131 copies/mL. A negative result does not preclude SARS-Cov-2 infection and should not be used as the sole basis for treatment or other patient management decisions. A negative result may occur with  improper specimen collection/handling, submission of specimen other than nasopharyngeal swab, presence of viral mutation(s) within the areas targeted by this assay, and inadequate number of viral copies (<131 copies/mL). A negative result must be combined with clinical observations, patient history, and epidemiological information. The expected result is Negative.  Fact Sheet for Patients:  PinkCheek.be  Fact Sheet for Healthcare Providers:  GravelBags.it  This test is no t yet approved or cleared by the [place] FDA and  has been authorized for detection and/or diagnosis of SARS-CoV-2 by FDA under an Emergency Use Authorization (EUA). This EUA will remain  in effect (meaning this test can be used) for the duration of the COVID-19 declaration under Section 564(b)(1) of the Act, 21 U.S.C. section 360bbb-3(b)(1), unless the authorization is terminated or revoked sooner.     Influenza A by PCR NEGATIVE NEGATIVE Final   Influenza B by PCR NEGATIVE NEGATIVE Final    Comment: (NOTE) The Xpert Xpress SARS-CoV-2/FLU/RSV assay is intended as an aid in  the diagnosis of influenza from Nasopharyngeal swab specimens and  should not be used as a sole basis for treatment. Nasal washings and    aspirates are unacceptable for Xpert Xpress SARS-CoV-2/FLU/RSV  testing.  Fact Sheet for Patients: PinkCheek.be  Fact Sheet for Healthcare Providers: GravelBags.it  This test is not yet approved or cleared by the [place] FDA and  has been authorized for detection and/or diagnosis of SARS-CoV-2 by  FDA under an Emergency Use Authorization (EUA). This EUA will remain  in effect (meaning this test can be used) for the duration of the  Covid-19 declaration under Section 564(b)(1) of the Act, 21  U.S.C. section 360bbb-3(b)(1), unless the authorization is  terminated or revoked. Performed at [facility], [place] [address] [place], [place] [address]   Blood culture (routine x 2)     Status: None   Collection Time: 11/04/19  1:57 AM   Specimen: BLOOD  Result Value Ref Range Status   Specimen Description   Final    BLOOD LEFT ANTECUBITAL Performed at [place] [address] [place], [place] [address]    Special Requests   Final    BOTTLES DRAWN AEROBIC AND ANAEROBIC Blood Culture results may not be optimal due to an excessive volume of blood received in culture bottles Performed at [place] [address] [place], [place] [address]    Culture   Final    NO GROWTH 5 DAYS Performed at [facility], [place] [address], [place], [place] [address]    Report Status 11/09/2019 FINAL  Final  Blood culture (  routine x 2)     Status: None   Collection Time: 11/04/19  9:36 AM   Specimen: BLOOD LEFT HAND  Result Value Ref Range Status   Specimen Description   Final    BLOOD LEFT HAND Performed at [place] [address], [place], [place] [address]    Special Requests   Final    BOTTLES DRAWN AEROBIC AND ANAEROBIC Blood Culture results may not be optimal due to an inadequate volume of blood received in culture bottles Performed at [place] [address], [place], [place] [address]    Culture   Final    NO GROWTH 5 DAYS Performed at [facility], [place] [address], [place], [place] [address]    Report Status 11/09/2019 FINAL  Final  Surgical pcr screen     Status: None   Collection Time: 11/06/19  4:00 PM   Specimen: Nasal Mucosa; Nasal Swab  Result Value Ref Range Status   MRSA, PCR NEGATIVE NEGATIVE Final   Staphylococcus aureus NEGATIVE NEGATIVE Final    Comment: (NOTE) The Xpert SA Assay (FDA approved for NASAL specimens in patients 76 years of age and older), is one component of a comprehensive surveillance program. It is not intended to diagnose infection nor to guide or monitor treatment. Performed at [facility], [place] [address], [place], [place] [address]          Radiology Studies: DG CHEST PORT 1 VIEW  Result Date: 11/10/2019 CLINICAL DATA:  Hypoxia EXAM: PORTABLE CHEST 1 VIEW COMPARISON:  Two days ago FINDINGS: Generalized interstitial coarsening without Kerley lines, likely bronchitic. No effusion or pneumothorax. No focal consolidation, right infrahilar density is likely summation shadows based on prior. Borderline heart size. Atherosclerotic calcification. IMPRESSION: Bronchitic markings without definite pneumonia. Electronically Signed   By: Monte Fantasia M.D.   On: 11/10/2019 08:24        Scheduled Meds: . budesonide (PULMICORT) nebulizer solution  0.5 mg Nebulization BID  . calcium carbonate  625 mg Oral Q breakfast  . cholecalciferol  5,000 Units Oral Daily  . FLUoxetine  10 mg Oral Daily  . fluticasone  2 spray Each Nare Daily  . guaiFENesin  1,200 mg Oral BID  . ipratropium-albuterol  3 mL Nebulization TID  . loratadine  10 mg Oral Daily  . magnesium gluconate  500 mg Oral Daily  . methylPREDNISolone (SOLU-MEDROL) injection  60 mg Intravenous Q12H  . pantoprazole  40 mg Oral Daily  . polyethylene glycol  17 g Oral Daily  .  pravastatin  40 mg Oral Daily  . senna-docusate  1 tablet Oral BID  . sorbitol  30 mL Oral Q2H  . zinc sulfate  220 mg Oral Daily   Continuous Infusions:    LOS: 6 days    Time spent: 35 minutes    Irine Seal, MD [facility]   To contact the attending provider between 7A-7P or the covering provider during after hours 7P-7A, please log into the web site [email or web address] and access using universal [place] password for that web site. If you do not have the password, please call the hospital operator.  11/10/2019, 4:16 PM

## 2019-11-11 ENCOUNTER — Inpatient Hospital Stay (HOSPITAL_COMMUNITY): Payer: Medicare Other

## 2019-11-11 DIAGNOSIS — I82452 Acute embolism and thrombosis of left peroneal vein: Secondary | ICD-10-CM

## 2019-11-11 DIAGNOSIS — I2699 Other pulmonary embolism without acute cor pulmonale: Secondary | ICD-10-CM

## 2019-11-11 DIAGNOSIS — I2693 Single subsegmental pulmonary embolism without acute cor pulmonale: Secondary | ICD-10-CM

## 2019-11-11 DIAGNOSIS — J441 Chronic obstructive pulmonary disease with (acute) exacerbation: Secondary | ICD-10-CM

## 2019-11-11 LAB — CBC
HCT: 40 % (ref 39.0–52.0)
Hemoglobin: 13.6 g/dL (ref 13.0–17.0)
MCH: 35.7 pg — ABNORMAL HIGH (ref 26.0–34.0)
MCHC: 34 g/dL (ref 30.0–36.0)
MCV: 105 fL — ABNORMAL HIGH (ref 80.0–100.0)
Platelets: 265 10*3/uL (ref 150–400)
RBC: 3.81 MIL/uL — ABNORMAL LOW (ref 4.22–5.81)
RDW: 12.1 % (ref 11.5–15.5)
WBC: 9.1 10*3/uL (ref 4.0–10.5)
nRBC: 0 % (ref 0.0–0.2)

## 2019-11-11 LAB — HEPARIN LEVEL (UNFRACTIONATED)
Heparin Unfractionated: 0.5 IU/mL (ref 0.30–0.70)
Heparin Unfractionated: 0.59 IU/mL (ref 0.30–0.70)

## 2019-11-11 LAB — BASIC METABOLIC PANEL
Anion gap: 8 (ref 5–15)
BUN: 17 mg/dL (ref 8–23)
CO2: 31 mmol/L (ref 22–32)
Calcium: 8.3 mg/dL — ABNORMAL LOW (ref 8.9–10.3)
Chloride: 95 mmol/L — ABNORMAL LOW (ref 98–111)
Creatinine, Ser: 0.87 mg/dL (ref 0.61–1.24)
GFR calc Af Amer: >60 mL/min (ref >60)
GFR calc non Af Amer: >60 mL/min (ref >60)
Glucose, Bld: 128 mg/dL — ABNORMAL HIGH (ref 70–99)
Potassium: 4.4 mmol/L (ref 3.5–5.1)
Sodium: 134 mmol/L — ABNORMAL LOW (ref 135–145)

## 2019-11-11 LAB — MAGNESIUM: Magnesium: 2.2 mg/dL (ref 1.7–2.4)

## 2019-11-11 MED ORDER — THIAMINE HCL 100 MG/ML IJ SOLN
100.0000 mg | Freq: Every day | INTRAMUSCULAR | Status: DC
  Filled 2019-11-11 (×2): qty 2

## 2019-11-11 MED ORDER — PREDNISONE 20 MG PO TABS
40.0000 mg | ORAL_TABLET | Freq: Every day | ORAL | Status: DC
  Administered 2019-11-11 – 2019-11-12 (×2): 40 mg via ORAL
  Filled 2019-11-11 (×2): qty 2

## 2019-11-11 MED ORDER — LORAZEPAM 1 MG PO TABS
1.0000 mg | ORAL_TABLET | ORAL | Status: DC | PRN
  Administered 2019-11-11 (×2): 1 mg via ORAL
  Filled 2019-11-11 (×2): qty 1

## 2019-11-11 MED ORDER — THIAMINE HCL 100 MG PO TABS
100.0000 mg | ORAL_TABLET | Freq: Every day | ORAL | Status: DC
  Administered 2019-11-11 – 2019-11-14 (×4): 100 mg via ORAL
  Filled 2019-11-11 (×4): qty 1

## 2019-11-11 MED ORDER — LORAZEPAM 2 MG/ML IJ SOLN
1.0000 mg | INTRAMUSCULAR | Status: DC | PRN
  Administered 2019-11-11: 1 mg via INTRAVENOUS
  Administered 2019-11-12: 2 mg via INTRAVENOUS
  Filled 2019-11-11 (×3): qty 1

## 2019-11-11 MED ORDER — FOLIC ACID 1 MG PO TABS
1.0000 mg | ORAL_TABLET | Freq: Every day | ORAL | Status: DC
  Administered 2019-11-11 – 2019-11-14 (×4): 1 mg via ORAL
  Filled 2019-11-11 (×4): qty 1

## 2019-11-11 MED ORDER — ADULT MULTIVITAMIN W/MINERALS CH
1.0000 | ORAL_TABLET | Freq: Every day | ORAL | Status: DC
  Administered 2019-11-11 – 2019-11-14 (×4): 1 via ORAL
  Filled 2019-11-11 (×4): qty 1

## 2019-11-11 NOTE — Progress Notes (Signed)
Occupational Therapy Treatment Patient Details Name: Michael Ware MRN: [id number] DOB: Jun 18, 1931 Today's Date: 11/11/2019    History of present illness 86 yo male admitted with R midshaft humerus fx after falling at home. Pt underwent ORIF of RT humeral fracture on 11/07/19.  Please refer to new OT shoulder precautions/restrictions now s/p ORIF 11/07/19. Hx of bradycardia.  ( New Dx RIGHT lower lobe PE started Heprin Drip 11/10/19 at 20:15 )   OT comments  Pt in bed- did not move pt other than R elbow, wrist and hand.  Edema decreased this day compared to yesterday but still edematous.    Follow Up Recommendations  SNF;Supervision/Assistance - 24 hour          Precautions / Restrictions Precautions Type of Shoulder Precautions: Ordered10/02/21 0700: OT eval and treat PROM 10 ER, 45 ABD,60 FE , PASSIVE ROM FOR ADL's ONLY, NOT for EXERCISES. OK to exercise elbow wrist and hand rom and for edema control. No pendulums, may allow arm to dangle Pt may shower. Shoulder Interventions: Shoulder sling/immobilizer;Off for dressing/bathing/exercises;At all times       Mobility Bed Mobility               General bed mobility comments: in bed- did not move OOB due to PE  Transfers                          ADL either performed or assessed with clinical judgement        Vision Baseline Vision/History: Wears glasses Wears Glasses: At all times Patient Visual Report: No change from baseline            Cognition Arousal/Alertness: Awake/alert Behavior During Therapy: WFL for tasks assessed/performed Overall Cognitive Status: Within Functional Limits for tasks assessed                                          Exercises Hand Exercises Forearm Supination: 10 reps;Right;AAROM Forearm Pronation: 10 reps;Right;AROM Wrist Flexion: AAROM;10 reps;Right Wrist Extension: AAROM;10 reps;Right Digit Composite Flexion: AAROM;10 reps;Right Composite Extension:  AAROM;Right;10 reps Digit Composite Abduction: AROM;10 reps;Right Digit Composite Adduction: AROM;Right;10 reps Digit Lifts: AROM;Right;10 reps Thumb Abduction: AROM;Right;10 reps           Pertinent Vitals/ Pain       Pain Score: 3  Pain Location: R wrist/ hand and elbowl with AAROM Pain Descriptors / Indicators: Sore         Frequency  Min 2X/week        Progress Toward Goals  OT Goals(current goals can now be found in the care plan section)  Progress towards OT goals: OT to reassess next treatment     Plan Discharge plan remains appropriate       AM-PAC OT "6 Clicks" Daily Activity     Outcome Measure   Help from another person eating meals?: A Little Help from another person taking care of personal grooming?: A Little Help from another person toileting, which includes using toliet, bedpan, or urinal?: A Lot Help from another person bathing (including washing, rinsing, drying)?: A Lot Help from another person to put on and taking off regular upper body clothing?: A Lot Help from another person to put on and taking off regular lower body clothing?: A Lot 6 Click Score: 14    End of Session    OT Visit Diagnosis:  Unsteadiness on feet (R26.81);History of falling (Z91.81);Pain Pain - part of body: Arm   Activity Tolerance Patient tolerated treatment well;Patient limited by pain   Patient Left with chair alarm set;in bed   Nurse Communication Mobility status        Time: 5369-2230 OT Time Calculation (min): 21 min  Charges: OT General Charges $OT Visit: 1 Visit OT Treatments $Therapeutic Activity: 8-22 mins  Kari Baars, [place] [phone] Office- [place], Edwena Felty D 11/11/2019, 1:18 PM

## 2019-11-11 NOTE — Progress Notes (Signed)
Bilateral lower extremity venous duplex has been completed. Preliminary results can be found in CV Proc through chart review.  Results were given to the patient's nurse, Lesly Rubenstein.  11/11/19 3:12 PM Carlos Levering RVT

## 2019-11-11 NOTE — Progress Notes (Signed)
PROGRESS NOTE    Michael Ware  ION:[id number] DOB: 1931/03/21 DOA: 11/04/2019 PCP: Alroy Dust, L.Marlou Sa, MD    Chief Complaint  Patient presents with  . Near Syncope  . Fall    Brief Narrative:  Patient is a 86 year old gentleman prior history of bradycardia, vitamin D deficiency, presented to the ED after mechanical fall.  Patient denies any head injury or loss of consciousness.  When EMS arrived at patient's home patient noted to have a near syncopal episode when they helped him off the floor.  Imaging showed a right humerus fracture.  EKG done was concerning for bradycardia.  Patient also noted to be hypotensive on admission.  Orthopedics and cardiology consulted and following.   Assessment & Plan:   Active Problems:   Pulmonary emboli (HCC)   Humerus fracture   Bradycardia   Near syncope   Depression   Lactic acidosis   AKI (acute kidney injury) (HCC)   Hyperlipidemia   Hypotension   Acute pain of left lower extremity   Rib pain on right side   Dehydration   Hypoxia   Acute deep vein thrombosis (DVT) of left peroneal vein (HCC)   COPD with acute exacerbation (HCC)  1 presyncope Likely secondary to acute PE versus hypotension versus vasovagal secondary to pain from humerus fracture.  Patient noted when EMS arrived to have a near syncopal episode.  Patient noted on admission presentation to be hypotensive and placed on IV fluids.  Repeat orthostatics done 11/05/2019 unremarkable.  2D echo with very technically difficult study with poor echo windows, EF of 70 to 75%, mild left ventricular hypertrophy, grade 1 diastolic dysfunction.  Patient seen in consultation by cardiology who are recommending outpatient event monitor.  IV fluids saline lock.  Patient was placed back on gentle hydration in preparation for surgery on 11/07/2019.  IV fluids have been discontinued.  No further episodes of presyncope.  See #2.  Supportive care.   #2 acute right lower lobe PE/left lower extremity  DVT During the hospitalization patient noted to have significant bouts of hypoxia with sats in the 70s on room air.  Patient did have some complaints of right rib pain and also some left lower extremity pain.  Due to hypoxia D-dimer was obtained which was elevated.  CT angiogram chest done 11/10/2019 consistent with right lower lobe PE.  2D echo done on admission with no right ventricular strain.  Lower extremity Dopplers were done with preliminary results concerning for left lower extremity DVT.  Patient on IV heparin which we will continue and could likely transition to oral DOAC after 48 hours of IV heparin.  Pain management.  Incentive spirometry.  Supportive care.  3.  Right humerus fracture Secondary to mechanical fall. Patient seen in consultation by orthopedics who initially well recommending conservative treatment and patient initially placed in a Sarmiento type humerus fracture brace, with repeat x-rays to follow-up on overall alignment.  Repeat x-rays done 11/06/2019 with severe displacement at the fracture site with greater than 200% translation of the shaft per orthopedics.  Orthopedics recommended surgery and patient underwent ORIF of humeral shaft fracture (11/07/2019).  Patient noted to have received a block and initially with no sensation to the right upper extremity.  Patient has regained sensation to the right upper extremity.  Patient stating oxycodone not helping with pain.  Patient was placed on IV morphine and MSIR however due to concerns for confusion IV morphine was discontinued as well as MSIR.  Patient placed on IV Toradol and  Percocet as needed for pain control.  Scheduled Tylenol discontinued.  PT/OT.  Per orthopedics.   4.  Visual hallucinations Patient did note some visual hallucinations where he stated he saw insects on the wall that would come towards him he will stick out his hand and there was subsequently disappear.  Patient also did state that sometimes the television  appeared upside down.  Patient stated had about 2 shots of vodka each evening or a glass of wine or beer.  Patient denies any prior history of alcohol withdrawal.  Concerned that patient may be undergoing alcohol withdrawal.  Will place on the Ativan withdrawal protocol.  Placed on thiamine, folic acid, multivitamin.  Supportive care.  5.??  Mild COPD exacerbation Patient noted to have some hypoxia, wheezing noted 1-2 nights ago.  Patient noted to have some wheezing when ambulating with physical therapy.?  Upper airway noise.  Patient denies any history of COPD.  CT angiogram chest done concerning for emphysematous changes in addition to right lower lobe PE and old chronic granulomatous disease.  Patient placed on IV Solu-Medrol, nebulizer treatments, Pulmicort, Flonase, Mucinex, Claritin, PPI with clinical improvement.  Transition IV Solu-Medrol to oral prednisone 40 mg daily x3 days.  Supportive care.  6.  Bradycardia Questionable etiology.  Patient noted to be bradycardic on admission.  2D echo done with a EF of 70 to 75%, no wall motion abnormalities, grade 1 diastolic dysfunction.  Patient noted to be hypotensive on admission which was felt likely could be from a vasovagal event.  Heart rate improved.  Patient seen in consultation by cardiology who are recommending 30-day event monitor to assess for symptomatic bradycardia arrhythmias with outpatient follow-up with Dr. Rayann Heman in 4 weeks.  Appreciate cardiology input and recommendations.  7.  Hypotension Likely secondary to hypovolemia as patient noted to be hypotensive on admission.  Blood pressure responded to IV fluids.  Saline lock IV fluids.  Minimize narcotics.  Status post ORIF.   8.  Acute kidney injury Resolved with hydration.  Follow.    9.  Leukocytosis Likely reactive leukocytosis.  Chest x-ray with no acute infiltrate.  Urinalysis unremarkable.  Patient currently afebrile.  Leukocytosis fluctuating and likely reactive postoperatively.   No need for antibiotics at this time.   10.  Depression Continue Prozac.  11.  Hyperlipidemia Statin.   12.  Lactic acidosis/HAGMA Likely secondary to dehydration/volume depletion in the setting of hypotension.  Urinalysis unremarkable.  Chest x-ray unremarkable.  Patient currently afebrile.  Resolved with hydration.  Anion gap normalized.  Follow.   13.  Right rib pain/left lower extremity pain>>> secondary to acute PE and probable lower extremity DVT Patient with complaints of right rib pain and left lower extremity pain on 11/08/2019.Marland Kitchen  Patient stated really did not realize significant pain he had as he was more focused on his right upper extremity pain.  Noted to have fallen prior to admission.  Rib films negative for any fracture.  Plain films of the pelvis, left hip, left femur negative for any acute fracture.  CT angiogram chest done concerning for right lower lobe PE.  Lower extremity Dopplers preliminary findings consistent with a left lower extremity DVT.  Continue incentive spirometry.  IV heparin.  Pain management.  Supportive care.  Pain management.    DVT prophylaxis: Heparin Code Status: DNR Family Communication: Updated patient.  Updated daughter at bedside.  Disposition:   Status is: Inpatient    Dispo: The patient is from: Home  Anticipated d/c is to: SNF              Anticipated d/c date is: 2 to 3 days once on oral anticoagulation when cleared by orthopedics.                Patient status post ORIF humeral shaft fracture, physical therapy evaluating.  Not stable for discharge.        Consultants:   Orthopedics: Jenetta Loges, PA/Dr. Onnie Graham 11/04/2019  Cardiology: Dr. Radford Pax 11/04/2019  Procedures:   Plain films of the right humerus 11/05/2019, 11/04/2019, 11/06/2019  Chest x-ray 11/04/2019  Plain films of the left knee 11/04/2019  2D echo 11/04/2019  ORIF of displaced right humeral shaft fracture per Dr. Onnie Graham 11/07/2019  Rib films  11/08/2019  Plain films of the left hip and pelvis/plain films of the left femur 11/09/2019  CT angiogram chest 11/10/2019  Lower extremity Dopplers 11/11/2019  Antimicrobials:   None   Subjective: Patient sitting up in bed.  Patient noted to have some visual hallucinations stating he is seeing insects on the wall which tend to come towards him he puts his hand out and they disappear.  Patient also states sometimes the TV is upside down and sometimes the size on the wall are upside down.  Denies any chest pain.  No shortness of breath.  When asked today he has pain in his left leg he starts to palpate it to see whether he can elicit some pain.  Patient does state drinks about 2 shots of vodka daily at home.  Patient states he has never had issues with alcohol withdrawal.    Objective: Vitals:   11/11/19 0612 11/11/19 0615 11/11/19 1336 11/11/19 1527  BP: (!) 149/56   (!) 136/52  Pulse: (!) 58   72  Resp: 18   20  Temp: 97.6 F (36.4 C)   97.6 F (36.4 C)  TempSrc: Oral   Oral  SpO2: 93% 90% 92% 92%  Weight:      Height:        Intake/Output Summary (Last 24 hours) at 11/11/2019 1720 Last data filed at 11/11/2019 1500 Gross per 24 hour  Intake 630.8 ml  Output 750 ml  Net -119.2 ml   Filed Weights   11/07/19 1015 11/10/19 0541 11/11/19 0427  Weight: 91.2 kg 96.6 kg 92.5 kg    Examination:  General exam: NAD Respiratory system: Clear to auscultation bilaterally.  No wheezes, no crackles, no rhonchi.  Normal respiratory effort.   Cardiovascular system: Regular rate rhythm no murmurs rubs or gallops.  No JVD.  No lower extremity edema. Gastrointestinal system: Abdomen is nontender, nondistended, soft, positive bowel sounds.  No rebound.  No guarding. Central nervous system: Alert and oriented.  No focal neurological deficits.  Extremities: Right upper extremity in sling.  Right upper extremity swelling improving.  Skin: No rashes, lesions or ulcers Psychiatry: Judgement and  insight appear normal. Mood & affect appropriate.     Data Reviewed: I have personally reviewed following labs and imaging studies  CBC: Recent Labs  Lab 11/07/19 0441 11/08/19 0621 11/09/19 0612 11/10/19 0838 11/11/19 0454  WBC 10.6* 11.2* 12.7* 8.3 9.1  NEUTROABS 7.3 9.4* 8.3* 5.1  --   HGB 14.0 13.6 15.6 13.1 13.6  HCT 41.7 40.9 45.5 38.5* 40.0  MCV 108.3* 108.5* 107.1* 107.2* 105.0*  PLT 198 220 241 265 956    Basic Metabolic Panel: Recent Labs  Lab 11/06/19 0425 11/06/19 0425 11/07/19 0441 11/08/19 2130 11/09/19 8657  11/10/19 0838 11/11/19 0454  NA 134*   < > 136 135 140 135 134*  K 4.0   < > 4.2 4.8 4.3 4.2 4.4  CL 102   < > 99 100 99 96* 95*  CO2 25   < > 27 28 30 30 31   GLUCOSE 106*   < > 121* 129* 77 93 128*  BUN 16   < > 17 15 20 19 17   CREATININE 1.04   < > 1.01 0.74 1.02 0.85 0.87  CALCIUM 8.2*   < > 8.6* 8.4* 8.9 8.3* 8.3*  MG 2.0  --  2.0  --   --   --  2.2   < > = values in this interval not displayed.    GFR: Estimated Creatinine Clearance: 68.5 mL/min (by C-G formula based on SCr of 0.87 mg/dL).  Liver Function Tests: Recent Labs  Lab 11/05/19 0453 11/06/19 0425  AST 52* 58*  ALT 36 45*  ALKPHOS 64 63  BILITOT 1.1 1.5*  PROT 5.4* 5.6*  ALBUMIN 3.1* 3.1*    CBG: Recent Labs  Lab 11/05/19 1130 11/05/19 1747 11/06/19 0021 11/06/19 0546  GLUCAP 165* 136* 127* 108*     Recent Results (from the past 240 hour(s))  Respiratory Panel by RT PCR (Flu A&B, Covid) - Nasopharyngeal Swab     Status: None   Collection Time: 11/04/19 12:49 AM   Specimen: Nasopharyngeal Swab  Result Value Ref Range Status   SARS Coronavirus 2 by RT PCR NEGATIVE NEGATIVE Final    Comment: (NOTE) SARS-CoV-2 target nucleic acids are NOT DETECTED.  The SARS-CoV-2 RNA is generally detectable in upper respiratoy specimens during the acute phase of infection. The lowest concentration of SARS-CoV-2 viral copies this assay can detect is 131 copies/mL. A  negative result does not preclude SARS-Cov-2 infection and should not be used as the sole basis for treatment or other patient management decisions. A negative result may occur with  improper specimen collection/handling, submission of specimen other than nasopharyngeal swab, presence of viral mutation(s) within the areas targeted by this assay, and inadequate number of viral copies (<131 copies/mL). A negative result must be combined with clinical observations, patient history, and epidemiological information. The expected result is Negative.  Fact Sheet for Patients:  PinkCheek.be  Fact Sheet for Healthcare Providers:  GravelBags.it  This test is no t yet approved or cleared by the [place] FDA and  has been authorized for detection and/or diagnosis of SARS-CoV-2 by FDA under an Emergency Use Authorization (EUA). This EUA will remain  in effect (meaning this test can be used) for the duration of the COVID-19 declaration under Section 564(b)(1) of the Act, 21 U.S.C. section 360bbb-3(b)(1), unless the authorization is terminated or revoked sooner.     Influenza A by PCR NEGATIVE NEGATIVE Final   Influenza B by PCR NEGATIVE NEGATIVE Final    Comment: (NOTE) The Xpert Xpress SARS-CoV-2/FLU/RSV assay is intended as an aid in  the diagnosis of influenza from Nasopharyngeal swab specimens and  should not be used as a sole basis for treatment. Nasal washings and  aspirates are unacceptable for Xpert Xpress SARS-CoV-2/FLU/RSV  testing.  Fact Sheet for Patients: PinkCheek.be  Fact Sheet for Healthcare Providers: GravelBags.it  This test is not yet approved or cleared by the [place] FDA and  has been authorized for detection and/or diagnosis of SARS-CoV-2 by  FDA under an Emergency Use Authorization (EUA). This EUA will remain  in effect (meaning this test can  be used) for the duration of the  Covid-19 declaration under Section 564(b)(1) of the Act, 21  U.S.C. section 360bbb-3(b)(1), unless the authorization is  terminated or revoked. Performed at [facility], [place] [address] [place], [place] [address]   Blood culture (routine x 2)     Status: None   Collection Time: 11/04/19  1:57 AM   Specimen: BLOOD  Result Value Ref Range Status   Specimen Description   Final    BLOOD LEFT ANTECUBITAL Performed at [facility] [address] [place], [place] [address]    Special Requests   Final    BOTTLES DRAWN AEROBIC AND ANAEROBIC Blood Culture results may not be optimal due to an excessive volume of blood received in culture bottles Performed at [facility] [address], [place], [place] [address]    Culture   Final    NO GROWTH 5 DAYS Performed at [facility], [place] [address], [place], [place] [address]    Report Status 11/09/2019 FINAL  Final  Blood culture (routine x 2)     Status: None   Collection Time: 11/04/19  9:36 AM   Specimen: BLOOD LEFT HAND  Result Value Ref Range Status   Specimen Description   Final    BLOOD LEFT HAND Performed at [facility] [address] [place], [place] [address]    Special Requests   Final    BOTTLES DRAWN AEROBIC AND ANAEROBIC Blood Culture results may not be optimal due to an inadequate volume of blood received in culture bottles Performed at [facility] [address], [place], [place] [address]    Culture   Final    NO GROWTH 5 DAYS Performed at [facility], [place] [address], [place], [place] [address]    Report Status 11/09/2019 FINAL  Final  Surgical pcr screen     Status: None   Collection Time: 11/06/19  4:00 PM   Specimen: Nasal Mucosa; Nasal Swab  Result Value Ref Range Status   MRSA, PCR NEGATIVE NEGATIVE Final   Staphylococcus aureus NEGATIVE NEGATIVE Final    Comment:  (NOTE) The Xpert SA Assay (FDA approved for NASAL specimens in patients 84 years of age and older), is one component of a comprehensive surveillance program. It is not intended to diagnose infection nor to guide or monitor treatment. Performed at [facility], [place] [address], [place], [place] [address]          Radiology Studies: CT ANGIO CHEST PE W OR WO CONTRAST  Result Date: 11/10/2019 CLINICAL DATA:  Chest pain and shortness of breath EXAM: CT ANGIOGRAPHY CHEST WITH CONTRAST TECHNIQUE: Multidetector CT imaging of the chest was performed using the standard protocol during bolus administration of intravenous contrast. Multiplanar CT image reconstructions and MIPs were obtained to evaluate the vascular anatomy. CONTRAST:  123mL OMNIPAQUE IOHEXOL 350 MG/ML SOLN COMPARISON:  Plain film from earlier in the same day. FINDINGS: Cardiovascular: Thoracic aorta demonstrates atherosclerotic calcifications without aneurysmal dilatation or dissection. Pulmonary artery is well visualized within normal branching pattern. Filling defects are noted within the lower lobe pulmonary arteries on the right consistent with acute pulmonary emboli. Coronary calcifications are noted. Mild cardiac enlargement is seen. Mediastinum/Nodes: Thoracic inlet is within normal limits. Scattered likely reactive mediastinal adenopathy is noted. The esophagus is within normal limits. Lungs/Pleura: Right-sided pleural effusion is noted. Right basilar consolidation is noted consistent with the known lower lobe pulmonary emboli.  Of emphysematous changes are seen. Upper Abdomen: Visualized upper abdomen demonstrates multiple calcified granulomas in the spleen. Musculoskeletal: Degenerative changes of the thoracic spine are noted. No acute bony abnormality is seen. Review of the MIP images confirms the above findings. IMPRESSION: Right lower lobe pulmonary emboli with associated effusion and consolidation. No right heart  strain is noted Changes of prior granulomatous disease. Electronically Signed   By: Inez Catalina M.D.   On: 11/10/2019 19:02   DG CHEST PORT 1 VIEW  Result Date: 11/10/2019 CLINICAL DATA:  Hypoxia EXAM: PORTABLE CHEST 1 VIEW COMPARISON:  Two days ago FINDINGS: Generalized interstitial coarsening without Kerley lines, likely bronchitic. No effusion or pneumothorax. No focal consolidation, right infrahilar density is likely summation shadows based on prior. Borderline heart size. Atherosclerotic calcification. IMPRESSION: Bronchitic markings without definite pneumonia. Electronically Signed   By: Monte Fantasia M.D.   On: 11/10/2019 08:24   VAS [place] LOWER EXTREMITY VENOUS (DVT)  Result Date: 11/11/2019  Lower Venous DVT Study Indications: Pulmonary embolism.  Risk Factors: Confirmed PE. Anticoagulation: Heparin. Limitations: Poor ultrasound/tissue interface. Comparison Study: No prior studies. Performing Technologist: Oliver Hum RVT  Examination Guidelines: A complete evaluation includes B-mode imaging, spectral Doppler, color Doppler, and power Doppler as needed of all accessible portions of each vessel. Bilateral testing is considered an integral part of a complete examination. Limited examinations for reoccurring indications may be performed as noted. The reflux portion of the exam is performed with the patient in reverse Trendelenburg.  +---------+---------------+---------+-----------+----------+--------------+ RIGHT    CompressibilityPhasicitySpontaneityPropertiesThrombus Aging +---------+---------------+---------+-----------+----------+--------------+ CFV      Full           Yes      Yes                                 +---------+---------------+---------+-----------+----------+--------------+ SFJ      Full                                                        +---------+---------------+---------+-----------+----------+--------------+ FV Prox  Full                                                         +---------+---------------+---------+-----------+----------+--------------+ FV Mid   Full                                                        +---------+---------------+---------+-----------+----------+--------------+ FV DistalFull                                                        +---------+---------------+---------+-----------+----------+--------------+ PFV      Full                                                        +---------+---------------+---------+-----------+----------+--------------+  POP      Full           Yes      Yes                                 +---------+---------------+---------+-----------+----------+--------------+ PTV      Full                                                        +---------+---------------+---------+-----------+----------+--------------+ PERO     Full                                                        +---------+---------------+---------+-----------+----------+--------------+   +---------+---------------+---------+-----------+----------+--------------+ LEFT     CompressibilityPhasicitySpontaneityPropertiesThrombus Aging +---------+---------------+---------+-----------+----------+--------------+ CFV      Full           Yes      Yes                                 +---------+---------------+---------+-----------+----------+--------------+ SFJ      Full                                                        +---------+---------------+---------+-----------+----------+--------------+ FV Prox  Full                                                        +---------+---------------+---------+-----------+----------+--------------+ FV Mid   Full                                                        +---------+---------------+---------+-----------+----------+--------------+ FV DistalFull                                                         +---------+---------------+---------+-----------+----------+--------------+ PFV      Full                                                        +---------+---------------+---------+-----------+----------+--------------+ POP      Full           Yes      Yes                                 +---------+---------------+---------+-----------+----------+--------------+  PTV      Full                                                        +---------+---------------+---------+-----------+----------+--------------+ PERO     None                                         Acute          +---------+---------------+---------+-----------+----------+--------------+     Summary: RIGHT: - There is no evidence of deep vein thrombosis in the lower extremity.  - No cystic structure found in the popliteal fossa.  LEFT: - Findings consistent with acute deep vein thrombosis involving the left peroneal veins. - No cystic structure found in the popliteal fossa.  *See table(s) above for measurements and observations.    Preliminary         Scheduled Meds: . budesonide (PULMICORT) nebulizer solution  0.5 mg Nebulization BID  . calcium carbonate  625 mg Oral Q breakfast  . cholecalciferol  5,000 Units Oral Daily  . FLUoxetine  10 mg Oral Daily  . fluticasone  2 spray Each Nare Daily  . folic acid  1 mg Oral Daily  . guaiFENesin  1,200 mg Oral BID  . ipratropium-albuterol  3 mL Nebulization TID  . loratadine  10 mg Oral Daily  . magnesium gluconate  500 mg Oral Daily  . multivitamin with minerals  1 tablet Oral Daily  . pantoprazole  40 mg Oral Daily  . polyethylene glycol  17 g Oral Daily  . pravastatin  40 mg Oral Daily  . predniSONE  40 mg Oral QAC breakfast  . senna-docusate  1 tablet Oral BID  . thiamine  100 mg Oral Daily   Or  . thiamine  100 mg Intravenous Daily  . zinc sulfate  220 mg Oral Daily   Continuous Infusions: . heparin 1,400 Units/hr (11/11/19 1252)     LOS: 7 days     Time spent: 35 minutes    Irine Seal, MD [facility]   To contact the attending provider between 7A-7P or the covering provider during after hours 7P-7A, please log into the web site [email or web address] and access using universal [place] password for that web site. If you do not have the password, please call the hospital operator.  11/11/2019, 5:20 PM

## 2019-11-11 NOTE — Progress Notes (Signed)
Michael Ware  MRN: [id number] DOB/Age: 10/01/1931 86 y.o. Physician: Ander Slade, M.D. 4 Days Post-Op Procedure(s) (LRB): OPEN REDUCTION INTERNAL FIXATION (ORIF) DISTAL HUMERUS FRACTURE (Right)  Subjective: Recent events noted with diagnosis of pulmonary embolus.  Patient currently on IV heparin.  Patient sitting in bedside chair resting comfortably.  Reports minimal shoulder and arm pain. Vital Signs Temp:  [97.6 F (36.4 C)-98.7 F (37.1 C)] 97.6 F (36.4 C) (10/05 1527) Pulse Rate:  [58-76] 72 (10/05 1527) Resp:  [18-20] 20 (10/05 1527) BP: (115-149)/(48-56) 136/52 (10/05 1527) SpO2:  [89 %-93 %] 92 % (10/05 1527) Weight:  [92.5 kg] 92.5 kg (10/05 0427)  Lab Results Recent Labs    11/10/19 0838 11/11/19 0454  WBC 8.3 9.1  HGB 13.1 13.6  HCT 38.5* 40.0  PLT 265 265   BMET Recent Labs    11/10/19 0838 11/11/19 0454  NA 135 134*  K 4.2 4.4  CL 96* 95*  CO2 30 31  GLUCOSE 93 128*  BUN 19 17  CREATININE 0.85 0.87  CALCIUM 8.3* 8.3*   INR  Date Value Ref Range Status  01/19/2013 1.01 0.0 - 1.4 Final     Exam  Right upper extremity demonstrates continued moderate swelling but some modest improvements in the degree of swelling about the wrist and hand.  Edema reduction exercises are reviewed and with some assistance patient is almost able to flex the elbow to bring his hand to his mouth.  Reviewed wrist flexion and extension as well as power grip exercises.  Impression  Status post ORIF comminuted and displaced right humeral shaft fracture.  Recent diagnosis of pulmonary embolus currently on IV anticoagulation.  Significant swelling of the right upper extremity persists.   Plan I have reviewed with Michael Ware some additional exercises to help improve his mobility and reduce swelling.  Clarification of rehab orders for occupational therapy:  Patient may perform pendulum exercises  Encourage range of motion active and active assisted to the elbow, wrist, and  hand.  May also begin active assisted range of motion to the right shoulder.  Patient may use right arm actively for activities of daily living as mobility and strength improve.  Avoid weightbearing on the right arm during transfers  Encourage patient to work towards active use of the right arm for feeding   Tamms 11/11/2019, 4:09 PM   Contact # [phone]

## 2019-11-11 NOTE — TOC Progression Note (Signed)
Transition of Care [facility]) - Progression Note    Patient Details  Name: Michael Ware MRN: [id number] Date of Birth: [birth year]  Transition of Care [facility]) CM/SW Contact  Bralynn Donado, Juliann Pulse, RN Phone Number: 11/11/2019, 12:59 PM  Clinical Narrative:  CM referral for substance counseling-patient going to SNF-resources more appropriate to be provided post acute care after SNF stay.Also patient declines etoh resources while here in hospital-he recc to inform MD/his dtr-Kelly.    Expected Discharge Plan: [place] Barriers to Discharge: Continued Medical Work up  Expected Discharge Plan and Services Expected Discharge Plan: [place] In-house Referral: Clinical Social Work Discharge Planning Services: CM Consult Post Acute Care Choice: [place] arrangements for the past 2 months: Single Family Home                                       Social Determinants of Health (SDOH) Interventions    Readmission Risk Interventions No flowsheet data found.

## 2019-11-11 NOTE — Plan of Care (Signed)
  Problem: Coping: Goal: Level of anxiety will decrease Outcome: Progressing   Problem: Health Behavior/Discharge Planning: Goal: Ability to manage health-related needs will improve Outcome: Progressing   Problem: Clinical Measurements: Goal: Ability to maintain clinical measurements within normal limits will improve Outcome: Progressing Goal: Will remain free from infection Outcome: Progressing Goal: Diagnostic test results will improve Outcome: Progressing Goal: Respiratory complications will improve Outcome: Progressing Goal: Cardiovascular complication will be avoided Outcome: Progressing   Problem: Activity: Goal: Risk for activity intolerance will decrease Outcome: Progressing   Problem: Pain Managment: Goal: General experience of comfort will improve Outcome: Progressing   Problem: Safety: Goal: Ability to remain free from injury will improve Outcome: Progressing

## 2019-11-11 NOTE — Progress Notes (Signed)
PT Cancellation Note  Patient Details Name: Michael Ware MRN: [id number] DOB: [birth year]   Cancelled Treatment:     New Dx RIGHT lower lobe PE started Heprin Drip 11/10/19 at 20:15  Will hold off Physical Therapy for today and resume tomorrow per protocol.    Rica Koyanagi  PTA Acute  Rehabilitation Services Pager      [phone] Office      [phone]

## 2019-11-11 NOTE — Progress Notes (Signed)
[place] for Heparin Indication: pulmonary embolus  Allergies  Allergen Reactions  . Atenolol Other (See Comments)    Caused heart rate to drop Other reaction(s): Other Caused heart rate to drop  . Other     Other reaction(s): Other Dry mouth  . Trazamine [Trazodone & Diet Manage Prod] Other (See Comments)    Dry mouth    Patient Measurements: Height: 5\' 8"  (172.7 cm) Weight: 92.5 kg (203 lb 14.4 oz) IBW/kg (Calculated) : 68.4 HEPARIN DW (KG): 87.2   Vital Signs: Temp: 98.7 F (37.1 C) (10/04 2112) Temp Source: Oral (10/04 2112) BP: 115/48 (10/04 2112) Pulse Rate: 76 (10/04 2112)  Labs: Recent Labs    11/08/19 4765 11/08/19 4650 11/09/19 0612 11/09/19 0612 11/10/19 0838 11/11/19 0454  HGB 13.6   < > 15.6   < > 13.1 13.6  HCT 40.9   < > 45.5  --  38.5* 40.0  PLT 220   < > 241  --  265 265  HEPARINUNFRC  --   --   --   --   --  0.50  CREATININE 0.74  --  1.02  --  0.85  --    < > = values in this interval not displayed.    Estimated Creatinine Clearance: 70.1 mL/min (by C-G formula based on SCr of 0.85 mg/dL).   Medical History: Past Medical History:  Diagnosis Date  . Acute GI bleeding 01/19/13   most likely due to diverticulosis  . BPH (benign prostatic hypertrophy) with urinary obstruction   . Bradycardia   . Diverticulosis   . Elevated hemoglobin A1c   . Hyperglycemia   . Hyperlipidemia   . Hypertension   . Internal hemorrhoids with bleeding and fecal soiling 11/19/2014  . Rectal bleed 01/19/13  . Vitamin D deficiency     Medications:  Infusions:  . heparin 1,400 Units/hr (11/10/19 2047)   Assessment: 86 yo M admitted with right humerus fracture s/p ORIF of humerus shaft fracture on 11/07/19. CBC- Hg, pltc WNL, DDimer 5.16 No bleeding noted. Chest CT + RLL PE, no right heart strain noted.   11/11/2019 HL 0.5, therapeutic CBC WNL No bleeding noted   Goal of Therapy:  Heparin level 0.3-0.7  units/ml Monitor platelets by anticoagulation protocol: Yes   Plan:  -continue heparin drip at 1400 units/hr -Check 8h heparin level -Daily heparin level & CBC while on heparin -F/U long term anticoagulation plans  Dolly Rias RPh 11/11/2019, 5:50 AM

## 2019-11-11 NOTE — Progress Notes (Signed)
[place] for Heparin Indication: pulmonary embolus  Allergies  Allergen Reactions  . Atenolol Other (See Comments)    Caused heart rate to drop Other reaction(s): Other Caused heart rate to drop  . Other     Other reaction(s): Other Dry mouth  . Trazamine [Trazodone & Diet Manage Prod] Other (See Comments)    Dry mouth    Patient Measurements: Height: 5\' 8"  (172.7 cm) Weight: 92.5 kg (203 lb 14.4 oz) IBW/kg (Calculated) : 68.4 HEPARIN DW (KG): 87.2   Vital Signs: Temp: 97.6 F (36.4 C) (10/05 0612) Temp Source: Oral (10/05 0612) BP: 149/56 (10/05 0612) Pulse Rate: 58 (10/05 0612)  Labs: Recent Labs    11/09/19 0612 11/09/19 0612 11/10/19 0838 11/11/19 0454 11/11/19 1338  HGB 15.6   < > 13.1 13.6  --   HCT 45.5  --  38.5* 40.0  --   PLT 241  --  265 265  --   HEPARINUNFRC  --   --   --  0.50 0.59  CREATININE 1.02  --  0.85 0.87  --    < > = values in this interval not displayed.    Estimated Creatinine Clearance: 68.5 mL/min (by C-G formula based on SCr of 0.87 mg/dL).   Medical History: Past Medical History:  Diagnosis Date  . Acute GI bleeding 01/19/13   most likely due to diverticulosis  . BPH (benign prostatic hypertrophy) with urinary obstruction   . Bradycardia   . Diverticulosis   . Elevated hemoglobin A1c   . Hyperglycemia   . Hyperlipidemia   . Hypertension   . Internal hemorrhoids with bleeding and fecal soiling 11/19/2014  . Rectal bleed 01/19/13  . Vitamin D deficiency     Medications:  Infusions:  . heparin 1,400 Units/hr (11/11/19 1252)   Assessment: 86 yo M admitted with right humerus fracture s/p ORIF of humerus shaft fracture on 11/07/19. CBC- Hg, pltc WNL, DDimer 5.16 No bleeding noted. Chest CT + RLL PE, no right heart strain noted.   11/11/2019  Heparin level 0.59, therapeutic on 1400 units/hr  CBC WNL  No bleeding or complications with infusion reported  Goal of Therapy:   Heparin level 0.3-0.7 units/ml Monitor platelets by anticoagulation protocol: Yes   Plan:  -Continue heparin drip at 1400 units/hr -Daily heparin level & CBC while on heparin -F/U long term anticoagulation plans  Peggyann Juba, PharmD, [place]: [phone] 11/11/2019, 2:37 PM

## 2019-11-12 ENCOUNTER — Inpatient Hospital Stay (HOSPITAL_COMMUNITY): Payer: Medicare Other

## 2019-11-12 LAB — BASIC METABOLIC PANEL
Anion gap: 10 (ref 5–15)
BUN: 20 mg/dL (ref 8–23)
CO2: 27 mmol/L (ref 22–32)
Calcium: 8.8 mg/dL — ABNORMAL LOW (ref 8.9–10.3)
Chloride: 101 mmol/L (ref 98–111)
Creatinine, Ser: 0.87 mg/dL (ref 0.61–1.24)
GFR calc non Af Amer: >60 mL/min (ref >60)
Glucose, Bld: 144 mg/dL — ABNORMAL HIGH (ref 70–99)
Potassium: 4.4 mmol/L (ref 3.5–5.1)
Sodium: 138 mmol/L (ref 135–145)

## 2019-11-12 LAB — MAGNESIUM: Magnesium: 2.3 mg/dL (ref 1.7–2.4)

## 2019-11-12 LAB — CBC
HCT: 40.8 % (ref 39.0–52.0)
Hemoglobin: 13.9 g/dL (ref 13.0–17.0)
MCH: 35.8 pg — ABNORMAL HIGH (ref 26.0–34.0)
MCHC: 34.1 g/dL (ref 30.0–36.0)
MCV: 105.2 fL — ABNORMAL HIGH (ref 80.0–100.0)
Platelets: 301 10*3/uL (ref 150–400)
RBC: 3.88 MIL/uL — ABNORMAL LOW (ref 4.22–5.81)
RDW: 12.3 % (ref 11.5–15.5)
WBC: 11.4 10*3/uL — ABNORMAL HIGH (ref 4.0–10.5)
nRBC: 0 % (ref 0.0–0.2)

## 2019-11-12 LAB — HEPARIN LEVEL (UNFRACTIONATED): Heparin Unfractionated: 0.42 IU/mL (ref 0.30–0.70)

## 2019-11-12 IMAGING — CT CT HEAD W/O CM
3 series · 15 of 47 positions shown, 18 images · non-contrast
Comparison: [DATE]

CLINICAL DATA: Psychosis

EXAM:
CT HEAD WITHOUT CONTRAST
TECHNIQUE: Contiguous axial images were obtained from the base of the skull
through the vertex without intravenous contrast.

[Series 2: head wo · axial · 0.50mm/px · z∈[+1544,+1694]mm · 9 of 36 slices shown, 12 images]
[im 3/36  brain]
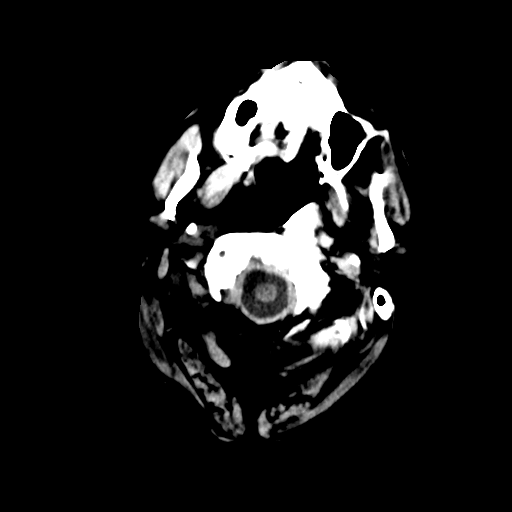
[im 3/36  bone]
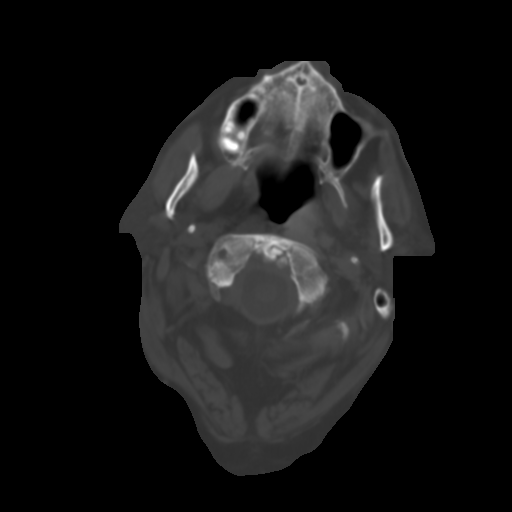
[im 7/36  brain]
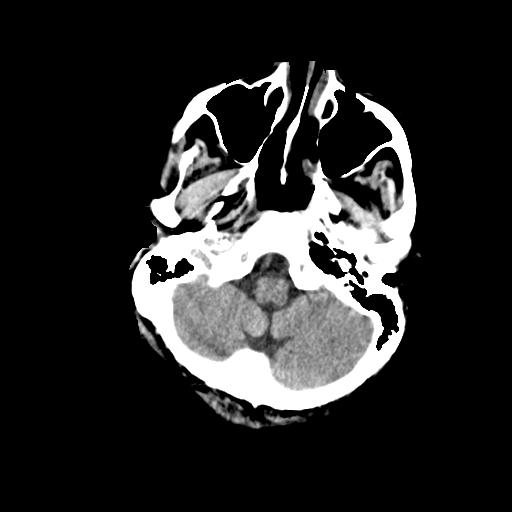
[im 10/36  brain]
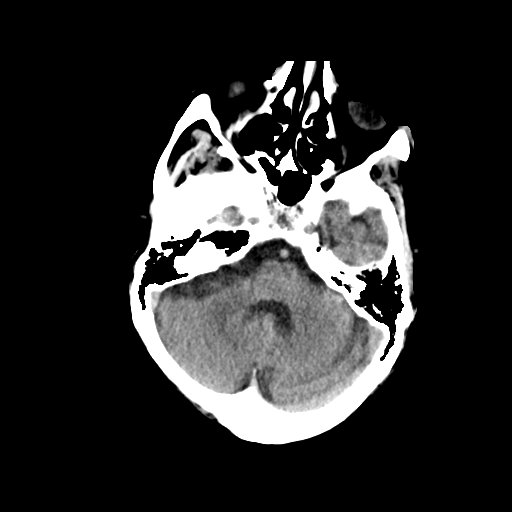
[im 14/36  brain]
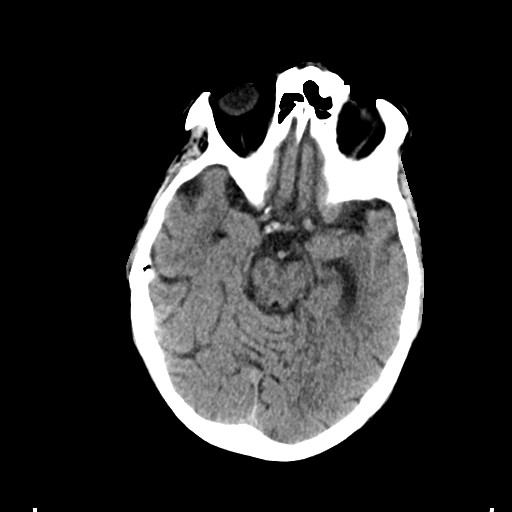
[im 19/36  brain]
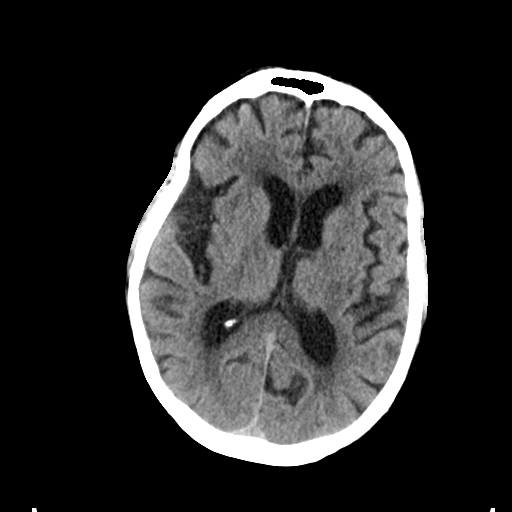
[im 19/36  bone]
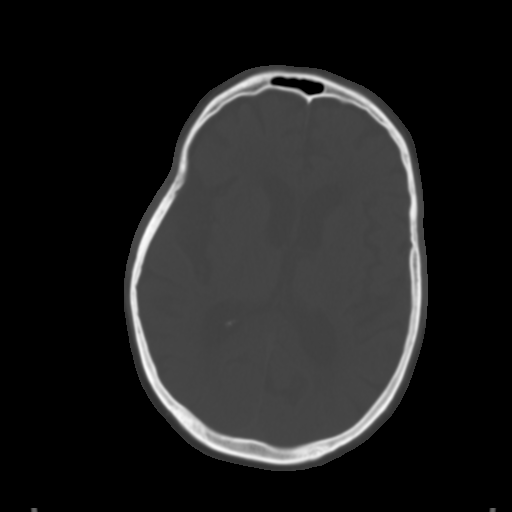
[im 22/36  brain]
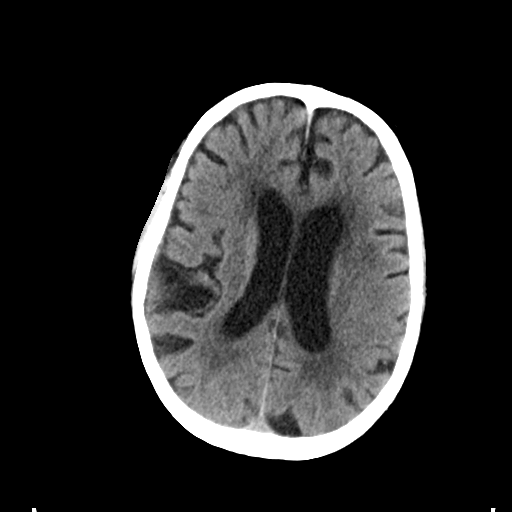
[im 26/36  brain]
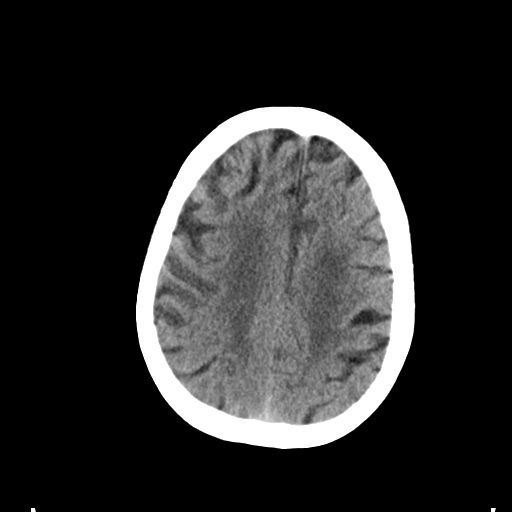
[im 29/36  brain]
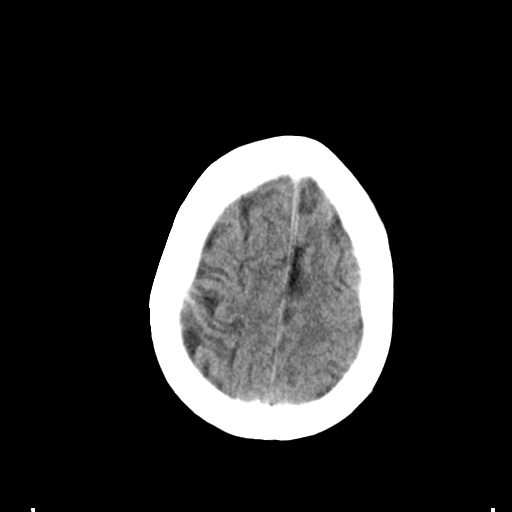
[im 33/36  brain]
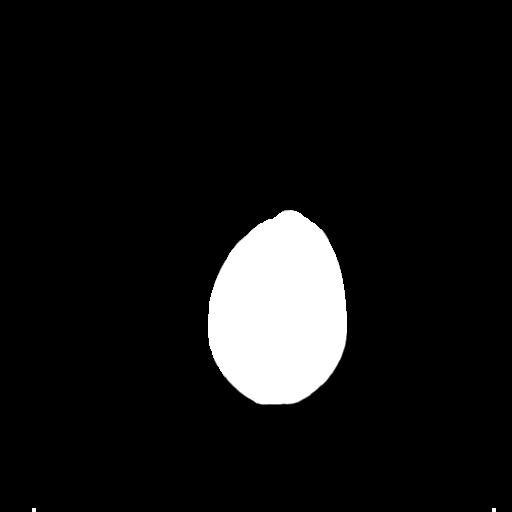
[im 33/36  bone]
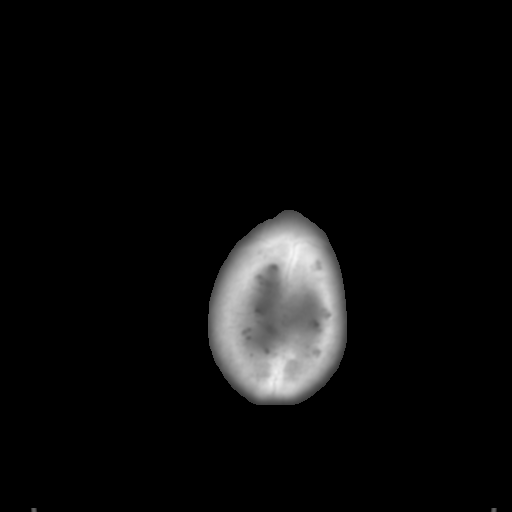

[Series 4: coronal soft tissue · coronal · 0.29mm/px · 3 of 74 slices shown]
[im 25/74  brain]
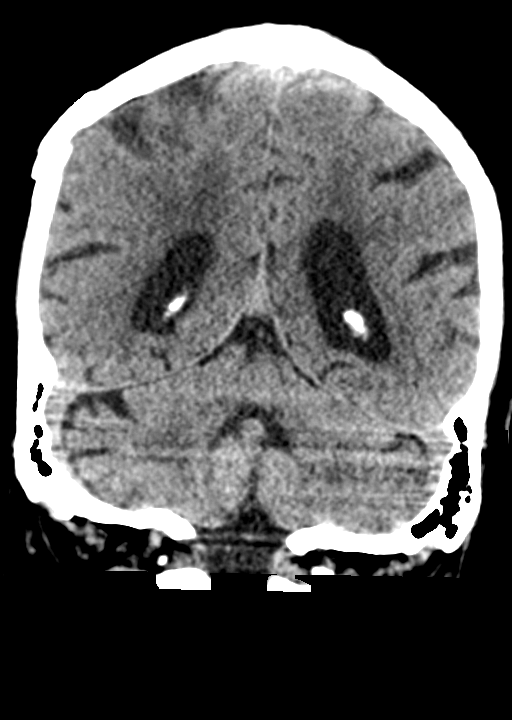
[im 33/74  brain]
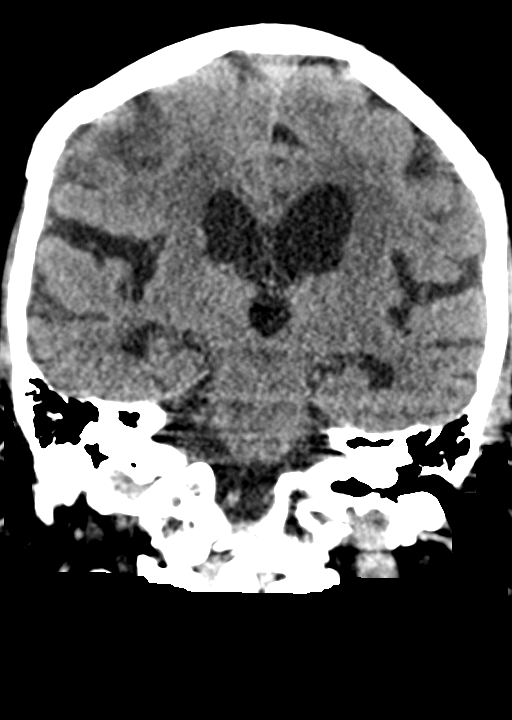
[im 41/74  brain]
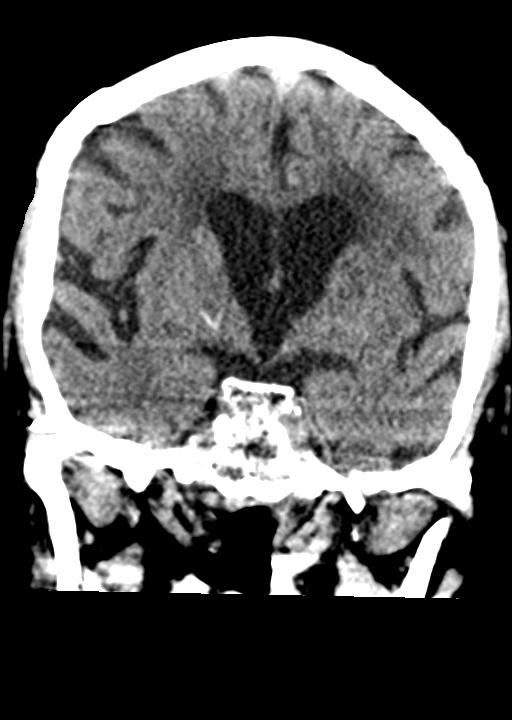

[Series 5: sagittal soft tissue · sagittal · 0.37mm/px · 3 of 56 slices shown]
[im 19/56  brain]
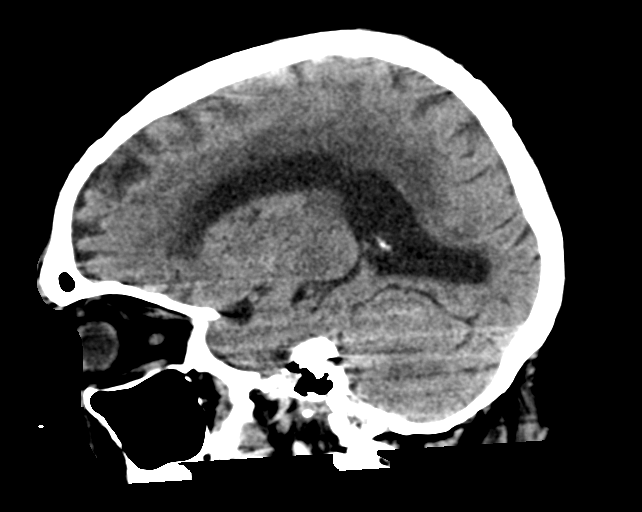
[im 28/56  brain]
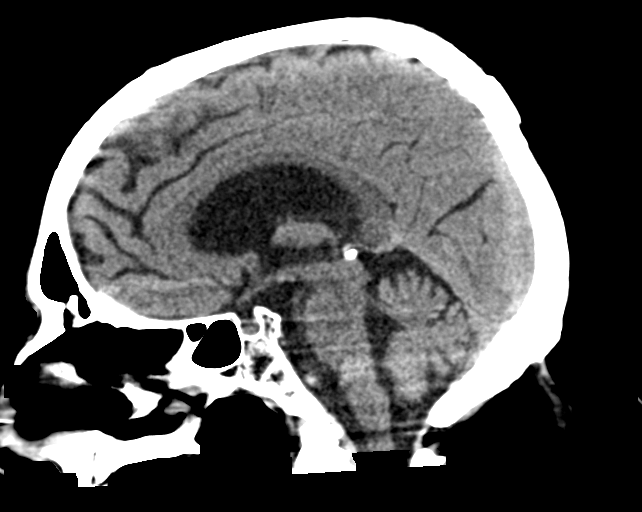
[im 37/56  brain]
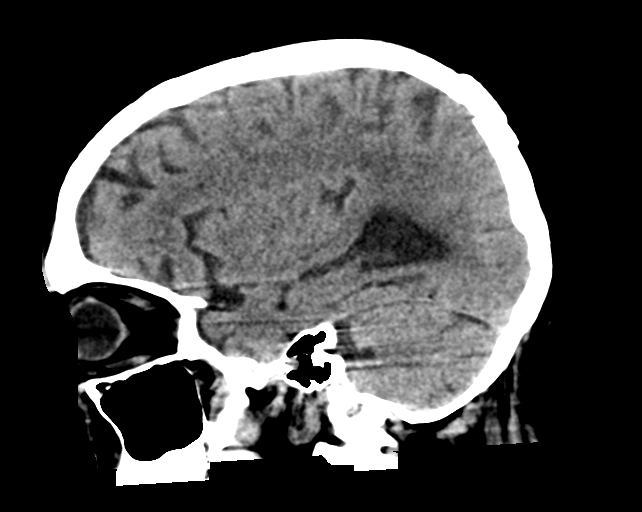

[15 of 47 positions shown; findings below may reference images not displayed]

FINDINGS: Brain: No evidence of acute infarction, hemorrhage, hydrocephalus,
extra-axial collection or mass lesion/mass effect. Chronic small
vessel ischemia in the cerebral white matter. Left caudate head
infarct which is interval but likely chronic in this setting.
Generalized atrophy.

Vascular: No hyperdense vessel or unexpected calcification.

Skull: Normal. Negative for fracture or focal lesion.

Sinuses/Orbits: No acute finding

Other: Motion degraded.
IMPRESSION: 1. No acute or reversible finding.
2. Chronic small vessel ischemia with progression from [T3].

## 2019-11-12 MED ORDER — IPRATROPIUM-ALBUTEROL 0.5-2.5 (3) MG/3ML IN SOLN
3.0000 mL | Freq: Two times a day (BID) | RESPIRATORY_TRACT | Status: DC
  Administered 2019-11-12 – 2019-11-13 (×2): 3 mL via RESPIRATORY_TRACT
  Filled 2019-11-12 (×3): qty 3

## 2019-11-12 MED ORDER — RIVAROXABAN (XARELTO) EDUCATION KIT FOR DVT/PE PATIENTS
PACK | Freq: Once | Status: AC
  Filled 2019-11-12: qty 1

## 2019-11-12 MED ORDER — HALOPERIDOL LACTATE 5 MG/ML IJ SOLN
2.0000 mg | Freq: Four times a day (QID) | INTRAMUSCULAR | Status: DC | PRN
  Administered 2019-11-12 (×3): 2 mg via INTRAVENOUS
  Filled 2019-11-12 (×3): qty 1

## 2019-11-12 MED ORDER — RIVAROXABAN 15 MG PO TABS
15.0000 mg | ORAL_TABLET | Freq: Two times a day (BID) | ORAL | Status: DC
  Administered 2019-11-12 – 2019-11-13 (×4): 15 mg via ORAL
  Filled 2019-11-12 (×4): qty 1

## 2019-11-12 MED ORDER — RIVAROXABAN 20 MG PO TABS: 20.0000 mg | ORAL_TABLET | Freq: Every day | ORAL | Status: DC

## 2019-11-12 NOTE — Progress Notes (Signed)
Upon morning med admin, pt. Found to be to be aggitated and experiencing visual hallucinations. Pt. Stated "I saw ants on the wall and they made the door right there into their living quarters." Pt. States "I don't feel like there's anything wrong with me, I think it is something they are doing with my meds." Primary RN Anderson Malta aware of CIWA score and notifying physician. At this time pt. Will receive 2 mg Ativan per order and additional 1 mg Ativan given if continued need. Primary RN Anderson Malta verbalized this plan d/t pt. Age and use of 2 L O2 [place]. Will reassess 1 hr post Ativan admin.

## 2019-11-12 NOTE — Progress Notes (Signed)
Michael Ware  MRN: [id number] DOB/Age: [birth year] 86 y.o. [facility] Procedure: Procedure(s) (LRB): OPEN REDUCTION INTERNAL FIXATION (ORIF) DISTAL HUMERUS FRACTURE (Right)     Subjective: Very confused today, CT scan of head ordered earlier   Vital Signs Temp:  [97.5 F (36.4 C)-99 F (37.2 C)] 97.7 F (36.5 C) (10/06 1213) Pulse Rate:  [59-76] 60 (10/06 1213) Resp:  [20] 20 (10/06 1213) BP: (121-163)/(52-110) 124/99 (10/06 1213) SpO2:  [91 %-95 %] 95 % (10/06 1213)  Lab Results Recent Labs    11/11/19 0454 11/12/19 0436  WBC 9.1 11.4*  HGB 13.6 13.9  HCT 40.0 40.8  PLT 265 301   BMET Recent Labs    11/11/19 0454 11/12/19 0436  NA 134* 138  K 4.4 4.4  CL 95* 101  CO2 31 27  GLUCOSE 128* 144*  BUN 17 20  CREATININE 0.87 0.87  CALCIUM 8.3* 8.8*   INR  Date Value Ref Range Status  01/19/2013 1.01 0.0 - 1.4 Final     Exam RUE with less swelling overall Unsure of his location    CT scan of head negative for acute changes     Plan Suspect his hospital delirium is multifactorial, with combination of narcotic pain meds, surgery and recent PE with hypoxia Currently on DT prophylaxis Continue to monitor  I would lean strongly towards discontinuing pain meds other than tylenol   Jenetta Loges PA-C  11/12/2019, 12:40 PM Contact # [phone]

## 2019-11-12 NOTE — Progress Notes (Signed)
PT Cancellation Note  Patient Details Name: Michael Ware MRN: [id number] DOB: [birth year]   Cancelled Treatment:     increased confusion including hallucinations.  Did work with OT today.  Head CT ordered.  Will attempt to see tomorrow.  Michael Ware  PTA Acute  Rehabilitation Services Pager      [phone] Office      [phone]    Michael Ware 11/12/2019, 5:04 PM

## 2019-11-12 NOTE — Progress Notes (Signed)
PROGRESS NOTE    Michael Ware  TOI:[id number] DOB: [birth year] DOA: 11/04/2019 PCP: Alroy Dust, L.Marlou Sa, MD     Brief Narrative:  Michael Ware is an 86 year old male with past medical history significant for bradycardia, vitamin D deficiency, depression who presents to the emergency department after mechanical fall.  When EMS arrived at patient's home, patient was noted to have near syncopal episode.  Work-up revealed right humerus fracture.  Orthopedic surgery was consulted and patient underwent ORIF on 10/1.  Hospitalization further complicated by PE, DVT and hallucinations.  New events last 24 hours / Subjective: Patient is able to answer most questions appropriately, but remains difficult to keep on track with his conversation due to his hallucinations.    Assessment & Plan:   Active Problems:   Humerus fracture   Bradycardia   Near syncope   Depression   Lactic acidosis   AKI (acute kidney injury) (HCC)   Hyperlipidemia   Hypotension   Acute pain of left lower extremity   Rib pain on right side   Dehydration   Hypoxia   Pulmonary emboli (HCC)   Acute deep vein thrombosis (DVT) of left peroneal vein (HCC)   COPD with acute exacerbation ([place])   Presyncope -Cardiology consulted, recommended for outpatient event monitor -No further episodes of presyncope   Right humerus fracture status post mechanical fall -Status post ORIF 10/1 -PT OT recommending SNF placement  PE and left lower extremity DVT -IV heparin, transition to Xarelto today  Delirium -Initially thought to be related to alcohol withdrawal.  Per daughter at bedside, his delirium and hallucinations started after surgery, has progressively gotten worse.  Patient does drink on a daily basis, but also has gone for weeks without alcohol in the past without any history of delirium tremens -Patient has been in the hospital for 8 days at this point, doubt that his delirium is secondary solely to alcohol withdrawal.  He  does not have any other physical signs of withdrawal such as tremors or tachycardia -CT head negative -Stop steroids as steroids can contribute to hallucinations and confusion -Delirium precautions -Stop oxycodone, hydroxyzine  -Haldol PRN  Mild COPD exacerbation -Without any wheezes noted.  Stop steroid burst  Bradycardia -Cardiology consulted, recommended for outpatient event monitor and follow-up with Dr. Rayann Heman in 4 weeks  AKI -Resolved   Depression -Continue Prozac  Hyperlipidemia -Continue pravachol     DVT prophylaxis:  SCD's Start: 11/07/19 1600 SCDs Start: 11/04/19 1629 Rivaroxaban (XARELTO) tablet 15 mg  rivaroxaban (XARELTO) tablet 20 mg  Code Status: DNR Family Communication: Daughter at bedside  Disposition Plan:  Status is: Inpatient  Remains inpatient appropriate because:Altered mental status   Dispo: The patient is from: Home              Anticipated d/c is to: SNF              Anticipated d/c date is: 2 days              Patient currently is not medically stable to d/c.  Remains with hallucinations, not at his baseline   Antimicrobials:  Anti-infectives (From admission, onward)   Start     Dose/Rate Route Frequency Ordered Stop   11/07/19 0600  ceFAZolin (ANCEF) IVPB 2g/100 mL premix        2 g 200 mL/hr over 30 Minutes Intravenous On call to O.R. 11/06/19 1840 11/07/19 1306        Objective: Vitals:   11/12/19 0527 11/12/19 0802 11/12/19  9629 11/12/19 1213  BP: (!) 163/61 (!) 159/110 (!) 121/102 (!) 124/99  Pulse: 61 (!) 59 66 60  Resp: 20   20  Temp: 99 F (37.2 C)   97.7 F (36.5 C)  TempSrc: Oral   Oral  SpO2: 91%   95%  Weight:      Height:        Intake/Output Summary (Last 24 hours) at 11/12/2019 1252 Last data filed at 11/12/2019 0920 Gross per 24 hour  Intake 230.37 ml  Output 200 ml  Net 30.37 ml   Filed Weights   11/07/19 1015 11/10/19 0541 11/11/19 0427  Weight: 91.2 kg 96.6 kg 92.5 kg    Examination:    General exam: Appears calm and comfortable  Respiratory system: Diminished breath sounds without wheeze or rhonchi.  Respiratory effort normal. No respiratory distress. No conversational dyspnea.  Cardiovascular system: S1 & S2 heard, RRR. No murmurs. No pedal edema. Gastrointestinal system: Abdomen is nondistended, soft and nontender. Normal bowel sounds heard. Central nervous system: Alert. No focal neurological deficits. Speech clear.  Extremities: Right upper extremity in a sling Skin: No rashes, lesions or ulcers on exposed skin  Psychiatry: Hallucinations   Data Reviewed: I have personally reviewed following labs and imaging studies  CBC: Recent Labs  Lab 11/07/19 0441 11/07/19 0441 11/08/19 0621 11/09/19 0612 11/10/19 0838 11/11/19 0454 11/12/19 0436  WBC 10.6*   < > 11.2* 12.7* 8.3 9.1 11.4*  NEUTROABS 7.3  --  9.4* 8.3* 5.1  --   --   HGB 14.0   < > 13.6 15.6 13.1 13.6 13.9  HCT 41.7   < > 40.9 45.5 38.5* 40.0 40.8  MCV 108.3*   < > 108.5* 107.1* 107.2* 105.0* 105.2*  PLT 198   < > 220 241 265 265 301   < > = values in this interval not displayed.   Basic Metabolic Panel: Recent Labs  Lab 11/06/19 0425 11/06/19 0425 11/07/19 0441 11/07/19 0441 11/08/19 5284 11/09/19 0612 11/10/19 1324 11/11/19 0454 11/12/19 0436  NA 134*   < > 136   < > 135 140 135 134* 138  K 4.0   < > 4.2   < > 4.8 4.3 4.2 4.4 4.4  CL 102   < > 99   < > 100 99 96* 95* 101  CO2 25   < > 27   < > 28 30 30 31 27   GLUCOSE 106*   < > 121*   < > 129* 77 93 128* 144*  BUN 16   < > 17   < > 15 20 19 17 20   CREATININE 1.04   < > 1.01   < > 0.74 1.02 0.85 0.87 0.87  CALCIUM 8.2*   < > 8.6*   < > 8.4* 8.9 8.3* 8.3* 8.8*  MG 2.0  --  2.0  --   --   --   --  2.2 2.3   < > = values in this interval not displayed.   GFR: Estimated Creatinine Clearance: 68.5 mL/min (by C-G formula based on SCr of 0.87 mg/dL). Liver Function Tests: Recent Labs  Lab 11/06/19 0425  AST 58*  ALT 45*  ALKPHOS 63   BILITOT 1.5*  PROT 5.6*  ALBUMIN 3.1*   No results for input(s): LIPASE, AMYLASE in the last 168 hours. No results for input(s): AMMONIA in the last 168 hours. Coagulation Profile: No results for input(s): INR, PROTIME in the last 168 hours. Cardiac Enzymes:  No results for input(s): CKTOTAL, CKMB, CKMBINDEX, TROPONINI in the last 168 hours. BNP (last 3 results) No results for input(s): PROBNP in the last 8760 hours. HbA1C: No results for input(s): HGBA1C in the last 72 hours. CBG: Recent Labs  Lab 11/05/19 1747 11/06/19 0021 11/06/19 0546  GLUCAP 136* 127* 108*   Lipid Profile: No results for input(s): CHOL, HDL, LDLCALC, TRIG, CHOLHDL, LDLDIRECT in the last 72 hours. Thyroid Function Tests: No results for input(s): TSH, T4TOTAL, FREET4, T3FREE, THYROIDAB in the last 72 hours. Anemia Panel: No results for input(s): VITAMINB12, FOLATE, FERRITIN, TIBC, IRON, RETICCTPCT in the last 72 hours. Sepsis Labs: No results for input(s): PROCALCITON, LATICACIDVEN in the last 168 hours.  Recent Results (from the past 240 hour(s))  Respiratory Panel by RT PCR (Flu A&B, Covid) - Nasopharyngeal Swab     Status: None   Collection Time: 11/04/19 12:49 AM   Specimen: Nasopharyngeal Swab  Result Value Ref Range Status   SARS Coronavirus 2 by RT PCR NEGATIVE NEGATIVE Final    Comment: (NOTE) SARS-CoV-2 target nucleic acids are NOT DETECTED.  The SARS-CoV-2 RNA is generally detectable in upper respiratoy specimens during the acute phase of infection. The lowest concentration of SARS-CoV-2 viral copies this assay can detect is 131 copies/mL. A negative result does not preclude SARS-Cov-2 infection and should not be used as the sole basis for treatment or other patient management decisions. A negative result may occur with  improper specimen collection/handling, submission of specimen other than nasopharyngeal swab, presence of viral mutation(s) within the areas targeted by this assay, and  inadequate number of viral copies (<131 copies/mL). A negative result must be combined with clinical observations, patient history, and epidemiological information. The expected result is Negative.  Fact Sheet for Patients:  PinkCheek.be  Fact Sheet for Healthcare Providers:  GravelBags.it  This test is no t yet approved or cleared by the [place] FDA and  has been authorized for detection and/or diagnosis of SARS-CoV-2 by FDA under an Emergency Use Authorization (EUA). This EUA will remain  in effect (meaning this test can be used) for the duration of the COVID-19 declaration under Section 564(b)(1) of the Act, 21 U.S.C. section 360bbb-3(b)(1), unless the authorization is terminated or revoked sooner.     Influenza A by PCR NEGATIVE NEGATIVE Final   Influenza B by PCR NEGATIVE NEGATIVE Final    Comment: (NOTE) The Xpert Xpress SARS-CoV-2/FLU/RSV assay is intended as an aid in  the diagnosis of influenza from Nasopharyngeal swab specimens and  should not be used as a sole basis for treatment. Nasal washings and  aspirates are unacceptable for Xpert Xpress SARS-CoV-2/FLU/RSV  testing.  Fact Sheet for Patients: PinkCheek.be  Fact Sheet for Healthcare Providers: GravelBags.it  This test is not yet approved or cleared by the [place] FDA and  has been authorized for detection and/or diagnosis of SARS-CoV-2 by  FDA under an Emergency Use Authorization (EUA). This EUA will remain  in effect (meaning this test can be used) for the duration of the  Covid-19 declaration under Section 564(b)(1) of the Act, 21  U.S.C. section 360bbb-3(b)(1), unless the authorization is  terminated or revoked. Performed at [facility], [place] [address] [place], [place] [address]   Blood culture (routine x 2)     Status: None   Collection Time: 11/04/19  1:57  AM   Specimen: BLOOD  Result Value Ref Range Status   Specimen Description   Final    BLOOD LEFT ANTECUBITAL  Performed at [facility], [place] [address] [place], [place] [address]    Special Requests   Final    BOTTLES DRAWN AEROBIC AND ANAEROBIC Blood Culture results may not be optimal due to an excessive volume of blood received in culture bottles Performed at [place] [address], [place], [place] [address]    Culture   Final    NO GROWTH 5 DAYS Performed at [facility], [place] [address] [place], [place] [address]    Report Status 11/09/2019 FINAL  Final  Blood culture (routine x 2)     Status: None   Collection Time: 11/04/19  9:36 AM   Specimen: BLOOD LEFT HAND  Result Value Ref Range Status   Specimen Description   Final    BLOOD LEFT HAND Performed at [facility] [address] [place], [place] [address]    Special Requests   Final    BOTTLES DRAWN AEROBIC AND ANAEROBIC Blood Culture results may not be optimal due to an inadequate volume of blood received in culture bottles Performed at [place] [facility] [address], [place], [place] [address]    Culture   Final    NO GROWTH 5 DAYS Performed at [facility], [place] [address] [place],  [address]    Report Status 11/09/2019 FINAL  Final  Surgical pcr screen     Status: None   Collection Time: 11/06/19  4:00 PM   Specimen: Nasal Mucosa; Nasal Swab  Result Value Ref Range Status   MRSA, PCR NEGATIVE NEGATIVE Final   Staphylococcus aureus NEGATIVE NEGATIVE Final    Comment: (NOTE) The Xpert SA Assay (FDA approved for NASAL specimens in patients 49 years of age and older), is one component of a comprehensive surveillance program. It is not intended to diagnose infection nor to guide or monitor treatment. Performed at [facility], [place] [address] [place],  [address]       Radiology  Studies: CT HEAD WO CONTRAST  Result Date: 11/12/2019 CLINICAL DATA:  Psychosis EXAM: CT HEAD WITHOUT CONTRAST TECHNIQUE: Contiguous axial images were obtained from the base of the skull through the vertex without intravenous contrast. COMPARISON:  05/27/2017 FINDINGS: Brain: No evidence of acute infarction, hemorrhage, hydrocephalus, extra-axial collection or mass lesion/mass effect. Chronic small vessel ischemia in the cerebral white matter. 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 Generalized atrophy. Vascular: No hyperdense vessel or unexpected calcification. Skull: Normal. Negative for fracture or focal lesion. Sinuses/Orbits: No acute finding Other: Motion degraded. IMPRESSION: 1. No acute or reversible finding. 2. Chronic small vessel ischemia with progression from 2019. Electronically Signed   By: Monte Fantasia M.D.   On: 11/12/2019 11:01   CT ANGIO CHEST PE W OR WO CONTRAST  Result Date: 11/10/2019 CLINICAL DATA:  Chest pain and shortness of breath EXAM: CT ANGIOGRAPHY CHEST WITH CONTRAST TECHNIQUE: Multidetector CT imaging of the chest was performed using the standard protocol during bolus administration of intravenous contrast. Multiplanar CT image reconstructions and MIPs were obtained to evaluate the vascular anatomy. CONTRAST:  179mL OMNIPAQUE IOHEXOL 350 MG/ML SOLN COMPARISON:  Plain film from earlier in the same day. FINDINGS: Cardiovascular: Thoracic aorta demonstrates atherosclerotic calcifications without aneurysmal dilatation or dissection. Pulmonary artery is well visualized within normal branching pattern. Filling defects are noted within the lower lobe pulmonary arteries on the right consistent with acute pulmonary emboli. Coronary calcifications are noted. Mild cardiac  enlargement is seen. Mediastinum/Nodes: Thoracic inlet is within normal limits. Scattered likely reactive mediastinal adenopathy is noted. The esophagus is within normal limits.  Lungs/Pleura: Right-sided pleural effusion is noted. Right basilar consolidation is noted consistent with the known lower lobe pulmonary emboli. Of emphysematous changes are seen. Upper Abdomen: Visualized upper abdomen demonstrates multiple calcified granulomas in the spleen. Musculoskeletal: Degenerative changes of the thoracic spine are noted. No acute bony abnormality is seen. Review of the MIP images confirms the above findings. IMPRESSION: Right lower lobe pulmonary emboli with associated effusion and consolidation. No right heart strain is noted Changes of prior granulomatous disease. Electronically Signed   By: Inez Catalina M.D.   On: 11/10/2019 19:02   VAS [place] LOWER EXTREMITY VENOUS (DVT)  Result Date: 11/11/2019  Lower Venous DVT Study Indications: Pulmonary embolism.  Risk Factors: Confirmed PE. Anticoagulation: Heparin. Limitations: Poor ultrasound/tissue interface. Comparison Study: No prior studies. Performing Technologist: Oliver Hum RVT  Examination Guidelines: A complete evaluation includes B-mode imaging, spectral Doppler, color Doppler, and power Doppler as needed of all accessible portions of each vessel. Bilateral testing is considered an integral part of a complete examination. Limited examinations for reoccurring indications may be performed as noted. The reflux portion of the exam is performed with the patient in reverse Trendelenburg.  +---------+---------------+---------+-----------+----------+--------------+ RIGHT    CompressibilityPhasicitySpontaneityPropertiesThrombus Aging +---------+---------------+---------+-----------+----------+--------------+ CFV      Full           Yes      Yes                                 +---------+---------------+---------+-----------+----------+--------------+ SFJ      Full                                                        +---------+---------------+---------+-----------+----------+--------------+ FV Prox  Full                                                         +---------+---------------+---------+-----------+----------+--------------+ FV Mid   Full                                                        +---------+---------------+---------+-----------+----------+--------------+ FV DistalFull                                                        +---------+---------------+---------+-----------+----------+--------------+ PFV      Full                                                        +---------+---------------+---------+-----------+----------+--------------+ POP      Full  Yes      Yes                                 +---------+---------------+---------+-----------+----------+--------------+ PTV      Full                                                        +---------+---------------+---------+-----------+----------+--------------+ PERO     Full                                                        +---------+---------------+---------+-----------+----------+--------------+   +---------+---------------+---------+-----------+----------+--------------+ LEFT     CompressibilityPhasicitySpontaneityPropertiesThrombus Aging +---------+---------------+---------+-----------+----------+--------------+ CFV      Full           Yes      Yes                                 +---------+---------------+---------+-----------+----------+--------------+ SFJ      Full                                                        +---------+---------------+---------+-----------+----------+--------------+ FV Prox  Full                                                        +---------+---------------+---------+-----------+----------+--------------+ FV Mid   Full                                                        +---------+---------------+---------+-----------+----------+--------------+ FV DistalFull                                                         +---------+---------------+---------+-----------+----------+--------------+ PFV      Full                                                        +---------+---------------+---------+-----------+----------+--------------+ POP      Full           Yes      Yes                                 +---------+---------------+---------+-----------+----------+--------------+ PTV  Full                                                        +---------+---------------+---------+-----------+----------+--------------+ PERO     None                                         Acute          +---------+---------------+---------+-----------+----------+--------------+     Summary: RIGHT: - There is no evidence of deep vein thrombosis in the lower extremity.  - No cystic structure found in the popliteal fossa.  LEFT: - Findings consistent with acute deep vein thrombosis involving the left peroneal veins. - No cystic structure found in the popliteal fossa.  *See table(s) above for measurements and observations.    Preliminary       Scheduled Meds: . budesonide (PULMICORT) nebulizer solution  0.5 mg Nebulization BID  . calcium carbonate  625 mg Oral Q breakfast  . cholecalciferol  5,000 Units Oral Daily  . FLUoxetine  10 mg Oral Daily  . fluticasone  2 spray Each Nare Daily  . folic acid  1 mg Oral Daily  . guaiFENesin  1,200 mg Oral BID  . ipratropium-albuterol  3 mL Nebulization BID  . loratadine  10 mg Oral Daily  . magnesium gluconate  500 mg Oral Daily  . multivitamin with minerals  1 tablet Oral Daily  . pantoprazole  40 mg Oral Daily  . polyethylene glycol  17 g Oral Daily  . pravastatin  40 mg Oral Daily  . rivaroxaban   Does not apply Once  . rivaroxaban  15 mg Oral BID WC   Followed by  . [START ON 12/03/2019] rivaroxaban  20 mg Oral Q supper  . senna-docusate  1 tablet Oral BID  . thiamine  100 mg Oral Daily   Or  . thiamine  100 mg Intravenous Daily  . zinc sulfate  220  mg Oral Daily   Continuous Infusions:   LOS: 8 days      Time spent: 50 minutes   Dessa Phi, DO [facility] 11/12/2019, 12:52 PM   Available via Epic secure chat 7am-7pm After these hours, please refer to coverage provider listed on [email or web address]

## 2019-11-12 NOTE — Progress Notes (Signed)
Occupational Therapy Progress Note  Patient confused this morning, having hallucinations of pictures on wall that are not there. Mod cues to redirect to exercises. Patient max A for supine to sit for trunk support and use of bed pad to scoot to EOB. Patient participate in exercises listed below, once completed stating he needs to use bathroom. Patient mod A to stand, mod A x2 for stand pivot to [facility] and back to bed with poor problem solving, safety requiring multimodal cues. Patient also impulsive while seated on commode leaning down to ground reaching for things that aren't there. Recommend continued acute OT services to facilitate D/C to venue listed below.    11/12/19 0900  OT Visit Information  Last OT Received On 11/12/19  Assistance Needed +2  History of Present Illness 86 yo male admitted with R midshaft humerus fx after falling at home. Pt underwent ORIF of RT humeral fracture on 11/07/19.  Please refer to new OT shoulder precautions/restrictions now s/p ORIF 11/07/19. Hx of bradycardia.  ( New Dx RIGHT lower lobe PE started Heprin Drip 11/10/19 at 20:15 )  Precautions  Precautions Fall;Shoulder  Type of Shoulder Precautions Ordered10/02/21 0700: OT eval and treat PROM 10 ER, 45 ABD,60 FE , PASSIVE ROM FOR ADL's ONLY, NOT for EXERCISES. OK to exercise elbow wrist and hand rom and for edema control. No pendulums, may allow arm to dangle Pt may shower.  Shoulder Interventions Shoulder sling/immobilizer;Off for dressing/bathing/exercises  Required Braces or Orthoses Sling  Pain Assessment  Pain Assessment Faces  Faces Pain Scale 4  Pain Location R UE with distal AROM  Pain Descriptors / Indicators Grimacing;Guarding  Pain Intervention(s) Monitored during session  Cognition  Arousal/Alertness Awake/alert  Behavior During Therapy Impulsive  Overall Cognitive Status Impaired/Different from baseline  Area of Impairment Memory;Following commands;Safety/judgement;Problem solving  Memory  Decreased short-term memory  Following Commands Follows one step commands with increased time;Follows one step commands inconsistently  Safety/Judgement Decreased awareness of safety  Problem Solving Requires verbal cues;Requires tactile cues;Difficulty sequencing  General Comments patient hallucinating this session, pointing to pictures on wall that are not there  ADL  Overall ADL's  Needs assistance/impaired  Toilet Transfer Moderate assistance;+2 for physical assistance;+2 for safety/equipment;Stand-pivot;Cueing for safety;Cueing for sequencing;BSC  Toilet Transfer Details (indicate cue type and reason) poor problem solving to turn towards bed after using commode with tactile input to LEs  Toileting- Clothing Manipulation and Hygiene Total assistance;Sit to/from stand  Toileting - Clothing Manipulation Details (indicate cue type and reason) s/p bowel movement, decreased safety, standing balance  Functional mobility during ADLs Moderate assistance;+2 for physical assistance;+2 for safety/equipment;Cueing for safety;Cueing for sequencing  Bed Mobility  Overal bed mobility Needs Assistance  Bed Mobility Supine to Sit;Sit to Supine  Supine to sit Max assist;HOB elevated  Sit to supine Mod assist  General bed mobility comments max A for trunk support to sit upright, mod A to safely lift LEs and guide trunk into bed  Balance  Overall balance assessment Needs assistance;History of Falls  Sitting-balance support Feet supported  Sitting balance-Leahy Scale Fair  Standing balance support Single extremity supported  Standing balance-Leahy Scale Poor  Standing balance comment x2 assist for static stand EOb  Restrictions  Weight Bearing Restrictions Yes  RUE Weight Bearing NWB  Transfers  Overall transfer level Needs assistance  Equipment used 1 person hand held assist  Transfers Sit to/from Stand;Stand Pivot Transfers  Sit to Stand Mod assist;+2 safety/equipment  Stand pivot transfers Mod  assist;+2 physical assistance;+2 safety/equipment  General transfer comment x1 hand held with L hand, and CNA on R side to assist with steadying for commode transfer and back to bed. decreased problem solving, balance, safety awareness requiring multimodal cues. Patient also impulsive attempting to stand multiple times before staff ready   Exercises  Exercises General Upper Extremity  General Exercises - Upper Extremity  Elbow Flexion AAROM;Right;10 reps  Elbow Extension AAROM;Right;10 reps  Hand Exercises  Forearm Supination 10 reps;Right;AROM  Forearm Pronation 10 reps;Right;AROM  Wrist Flexion AROM;10 reps;Right  Wrist Extension AROM;Right;10 reps  Digit Composite Flexion AROM;Right;10 reps  Composite Extension AROM;Right;10 reps  OT - End of Session  Activity Tolerance Patient tolerated treatment well;Treatment limited secondary to medical complications (Comment) (hallucinations)  Patient left in bed;with call bell/phone within reach;with bed alarm set;with family/visitor present  Nurse Communication Mobility status  OT Assessment/Plan  OT Plan Discharge plan remains appropriate  OT Visit Diagnosis Unsteadiness on feet (R26.81);History of falling (Z91.81);Pain  Pain - Right/Left Right  Pain - part of body Arm  OT Frequency (ACUTE ONLY) Min 2X/week  Follow Up Recommendations SNF;Supervision/Assistance - 24 hour  OT Equipment None recommended by OT  AM-PAC OT "6 Clicks" Daily Activity Outcome Measure (Version 2)  Help from another person eating meals? 3  Help from another person taking care of personal grooming? 3  Help from another person toileting, which includes using toliet, bedpan, or urinal? 2  Help from another person bathing (including washing, rinsing, drying)? 2  Help from another person to put on and taking off regular upper body clothing? 2  Help from another person to put on and taking off regular lower body clothing? 2  6 Click Score 14  OT Goal Progression   Progress towards OT goals Progressing toward goals  Acute Rehab OT Goals  Patient Stated Goal To go to [facility]  OT Goal Formulation With patient  Time For Goal Achievement 11/22/19  Potential to Achieve Goals Good  OT Time Calculation  OT Start Time (ACUTE ONLY) 0850  OT Stop Time (ACUTE ONLY) 0915  OT Time Calculation (min) 25 min  OT General Charges  $OT Visit 1 Visit  OT Treatments  $Self Care/Home Management  8-22 mins  $Therapeutic Exercise 8-22 mins   Delbert Phenix OT OT pager: [phone]

## 2019-11-12 NOTE — Progress Notes (Signed)
Pts daughter at bedside  

## 2019-11-12 NOTE — Progress Notes (Addendum)
[place] for Heparin - transition to [facility] today 10/6 Indication: pulmonary embolus/DVT  Allergies  Allergen Reactions  . Atenolol Other (See Comments)    Caused heart rate to drop Other reaction(s): Other Caused heart rate to drop  . Other     Other reaction(s): Other Dry mouth  . Trazamine [Trazodone & Diet Manage Prod] Other (See Comments)    Dry mouth    Patient Measurements: Height: 5\' 8"  (172.7 cm) Weight: 92.5 kg (203 lb 14.4 oz) IBW/kg (Calculated) : 68.4 HEPARIN DW (KG): 87.2   Vital Signs: Temp: 99 F (37.2 C) (10/06 0527) Temp Source: Oral (10/06 0527) BP: 159/110 (10/06 0802) Pulse Rate: 59 (10/06 0802)  Labs: Recent Labs    11/10/19 0838 11/10/19 0838 11/11/19 0454 11/11/19 1338 11/12/19 0436  HGB 13.1   < > 13.6  --  13.9  HCT 38.5*  --  40.0  --  40.8  PLT 265  --  265  --  301  HEPARINUNFRC  --   --  0.50 0.59 0.42  CREATININE 0.85  --  0.87  --  0.87   < > = values in this interval not displayed.    Estimated Creatinine Clearance: 68.5 mL/min (by C-G formula based on SCr of 0.87 mg/dL).   Medical History: Past Medical History:  Diagnosis Date  . Acute GI bleeding 01/19/13   most likely due to diverticulosis  . BPH (benign prostatic hypertrophy) with urinary obstruction   . Bradycardia   . Diverticulosis   . Elevated hemoglobin A1c   . Hyperglycemia   . Hyperlipidemia   . Hypertension   . Internal hemorrhoids with bleeding and fecal soiling 11/19/2014  . Rectal bleed 01/19/13  . Vitamin D deficiency     Medications:  Infusions:  . heparin 1,400 Units/hr (11/12/19 0720)   Assessment: 86 yo M admitted with right humerus fracture s/p ORIF of humerus shaft fracture on 11/07/19. CBC- Hg, pltc WNL, DDimer 5.16 No bleeding noted. Chest CT + RLL PE, no right heart strain noted.  Dopplers - positive for L DVT  11/12/2019  To change IV heparin to Xarelto today  Goal of Therapy:  Heparin  level 0.3-0.7 units/ml Monitor platelets by anticoagulation protocol: Yes   Plan:  - D/C IV heparin now  - Start Xarelto 15mg  PO BID x 3 weeks then 20mg  PO once daily - Daily CBC     Adrian Saran, PharmD, BCPS [phone] until 3pm 11/12/2019 8:40 AM

## 2019-11-12 NOTE — Progress Notes (Signed)
Dr Maylene Roes in to see patient.  She said not to give any more ativan right now for the ciwa score. Daughter at bedside.

## 2019-11-13 LAB — CBC
HCT: 40.7 % (ref 39.0–52.0)
Hemoglobin: 13.8 g/dL (ref 13.0–17.0)
MCH: 36.3 pg — ABNORMAL HIGH (ref 26.0–34.0)
MCHC: 33.9 g/dL (ref 30.0–36.0)
MCV: 107.1 fL — ABNORMAL HIGH (ref 80.0–100.0)
Platelets: 343 10*3/uL (ref 150–400)
RBC: 3.8 MIL/uL — ABNORMAL LOW (ref 4.22–5.81)
RDW: 12.4 % (ref 11.5–15.5)
WBC: 10.7 10*3/uL — ABNORMAL HIGH (ref 4.0–10.5)
nRBC: 0 % (ref 0.0–0.2)

## 2019-11-13 LAB — BASIC METABOLIC PANEL
Anion gap: 10 (ref 5–15)
BUN: 23 mg/dL (ref 8–23)
CO2: 27 mmol/L (ref 22–32)
Calcium: 8.9 mg/dL (ref 8.9–10.3)
Chloride: 102 mmol/L (ref 98–111)
Creatinine, Ser: 0.92 mg/dL (ref 0.61–1.24)
GFR calc non Af Amer: >60 mL/min (ref >60)
Glucose, Bld: 96 mg/dL (ref 70–99)
Potassium: 4.3 mmol/L (ref 3.5–5.1)
Sodium: 139 mmol/L (ref 135–145)

## 2019-11-13 MED ORDER — QUETIAPINE FUMARATE 25 MG PO TABS: 50.0000 mg | ORAL_TABLET | Freq: Every day | ORAL | Status: DC

## 2019-11-13 MED ORDER — QUETIAPINE FUMARATE 25 MG PO TABS
25.0000 mg | ORAL_TABLET | Freq: Every day | ORAL | Status: DC
  Administered 2019-11-13: 25 mg via ORAL
  Filled 2019-11-13: qty 1

## 2019-11-13 NOTE — Progress Notes (Signed)
Occupational Therapy Progress Note   Patient clearer today, no hallucinations and alert + oriented. Patient require mod A for bed mobility. Participate in exercises listed below requiring increased time especially with elbow flexion/extension due to increased pain. Hand edema much improved, significant edema still at elbow. Patient able to flex elbow to approx 110 degrees. With bed height elevated patient min A to power up to standing and transfer to recliner with limited eccentric control.     11/13/19 1405  OT Visit Information  Assistance Needed +1 (+2 chair follow to progress mob)  History of Present Illness 86 yo male admitted with R midshaft humerus fx after falling at home. Pt underwent ORIF of RT humeral fracture on 11/07/19.  Please refer to new OT shoulder precautions/restrictions now s/p ORIF 11/07/19. Hx of bradycardia.  ( New Dx RIGHT lower lobe PE started Heprin Drip 11/10/19 at 20:15 )  Precautions  Precautions Fall;Shoulder  Type of Shoulder Precautions Ordered10/02/21 0700: OT eval and treat PROM 10 ER, 45 ABD,60 FE , PASSIVE ROM FOR ADL's ONLY, NOT for EXERCISES. OK to exercise elbow wrist and hand rom and for edema control. No pendulums, may allow arm to dangle Pt may shower.  Shoulder Interventions Shoulder sling/immobilizer;Off for dressing/bathing/exercises  Precaution Booklet Issued Yes (comment)  Required Braces or Orthoses Sling  Other Brace n/a  Pain Assessment  Pain Assessment Faces  Faces Pain Scale 6  Pain Location R UE with distal AROM  Pain Descriptors / Indicators Grimacing;Guarding  Pain Intervention(s) Monitored during session;Ice applied  Cognition  Arousal/Alertness Awake/alert  Behavior During Therapy WFL for tasks assessed/performed  Overall Cognitive Status Within Functional Limits for tasks assessed  General Comments patient alert/oriented, no hallucinations today, following directions appropriately  ADL  Overall ADL's  Needs assistance/impaired   Toilet Transfer Minimal assistance;Cueing for safety;Cueing for sequencing;Stand-pivot;BSC  Toilet Transfer Details (indicate cue type and reason) to recliner, min A to power up from EOB with height elevated  Bed Mobility  Overal bed mobility Needs Assistance  Bed Mobility Supine to Sit  Supine to sit Mod assist;HOB elevated  General bed mobility comments increased initiation today to bring LEs to EOB with mod A for trunk control   Balance  Overall balance assessment Needs assistance;History of Falls  Sitting-balance support Feet supported  Sitting balance-Leahy Scale Fair  Standing balance support Single extremity supported  Standing balance-Leahy Scale Poor  Standing balance comment x1 assist with HHA L UE  Restrictions  Weight Bearing Restrictions Yes  RUE Weight Bearing NWB  Transfers  Overall transfer level Needs assistance  Equipment used 1 person hand held assist  Transfers Sit to/from Stand;Stand Pivot Transfers  Sit to Stand Min assist;From elevated surface  Stand pivot transfers Min assist  General transfer comment cues to use momentum to power up to standing from EOB with bed heigh elevated required min A, hand held assist L UE and min A for stability to pivot to recliner chair and cues to reach back, limited eccentric control  General Comments  General comments (skin integrity, edema, etc.) pt reporting dizziness with exercise at EOB, BP at EOB 109/56 (72), BP in chair 119/60 (78) HR 57 and 90% O2 on 2L  General Exercises - Upper Extremity  Elbow Flexion AAROM;Right;10 reps  Elbow Extension AAROM;Right;10 reps  Wrist Flexion AROM;Right;10 reps  Wrist Extension AROM;Right;10 reps  Digit Composite Flexion AROM;Right;10 reps  Composite Extension AROM;Right;10 reps  Hand Exercises  Forearm Supination 10 reps;Right;AROM  Forearm Pronation 10 reps;Right;AROM  OT -  End of Session  Equipment Utilized During Treatment Other (comment);Oxygen (sling)  Activity Tolerance  Patient tolerated treatment well;Patient limited by pain  Patient left in chair;with call bell/phone within reach;with chair alarm set  Nurse Communication Mobility status  OT Assessment/Plan  OT Plan Discharge plan remains appropriate  OT Visit Diagnosis Unsteadiness on feet (R26.81);History of falling (Z91.81);Pain  Pain - Right/Left Right  Pain - part of body Arm  OT Frequency (ACUTE ONLY) Min 2X/week  Follow Up Recommendations SNF;Supervision/Assistance - 24 hour  OT Equipment None recommended by OT  AM-PAC OT "6 Clicks" Daily Activity Outcome Measure (Version 2)  Help from another person eating meals? 3  Help from another person taking care of personal grooming? 3  Help from another person toileting, which includes using toliet, bedpan, or urinal? 2  Help from another person bathing (including washing, rinsing, drying)? 2  Help from another person to put on and taking off regular upper body clothing? 2  Help from another person to put on and taking off regular lower body clothing? 2  6 Click Score 14  OT Goal Progression  Progress towards OT goals Progressing toward goals  Acute Rehab OT Goals  Patient Stated Goal To go to [facility]  OT Goal Formulation With patient  Time For Goal Achievement 11/22/19  Potential to Achieve Goals Good  OT Time Calculation  OT Start Time (ACUTE ONLY) 1117  OT Stop Time (ACUTE ONLY) 1203  OT Time Calculation (min) 46 min  OT General Charges  $OT Visit 1 Visit  OT Treatments  $Self Care/Home Management  8-22 mins  $Therapeutic Exercise 23-37 mins   Michael Ware OT OT pager: [phone]

## 2019-11-13 NOTE — Plan of Care (Signed)
  Problem: Coping: Goal: Level of anxiety will decrease Outcome: Progressing   Problem: Health Behavior/Discharge Planning: Goal: Ability to manage health-related needs will improve Outcome: Progressing   Problem: Clinical Measurements: Goal: Ability to maintain clinical measurements within normal limits will improve Outcome: Progressing Goal: Will remain free from infection Outcome: Progressing Goal: Diagnostic test results will improve Outcome: Progressing Goal: Respiratory complications will improve Outcome: Progressing Goal: Cardiovascular complication will be avoided Outcome: Progressing   Problem: Activity: Goal: Risk for activity intolerance will decrease Outcome: Progressing   Problem: Pain Managment: Goal: General experience of comfort will improve Outcome: Progressing   Problem: Safety: Goal: Ability to remain free from injury will improve Outcome: Progressing

## 2019-11-13 NOTE — Care Management Important Message (Signed)
Important Message  Patient Details IM Letter given to the Patient Name: Michael Ware MRN: [id number] Date of Birth: Apr 10, 1931   Medicare Important Message Given:  Yes     Kerin Salen 11/13/2019, 12:38 PM

## 2019-11-13 NOTE — Progress Notes (Signed)
Physical Therapy Treatment Patient Details Name: Michael Ware MRN: [id number] DOB: 1931/06/09 Today's Date: 11/13/2019    History of Present Illness 86 yo male admitted with R midshaft humerus fx after falling at home. Pt underwent ORIF of RT humeral fracture on 11/07/19.  Please refer to new OT shoulder precautions/restrictions now s/p ORIF 11/07/19. Hx of bradycardia.  ( New Dx RIGHT lower lobe PE started Heprin Drip 11/10/19 at 20:15 )    PT Comments    Assisted OOB to amb.  General bed mobility comments: increased initiation today to bring LEs to EOB with max A for trunk control.  General transfer comment: assist to power up and used RW this session with therapist holding/securing R side (Pt R UE in sling) for increased stability and second assist on pt's left. General Gait Details: pt required +2 assist used RW with Therapist securing/holding R side (pt's R UE in sling) and 2nd assist on L side.  Very limited distance. Positioned upright in recliner.   Follow Up Recommendations  SNF     Equipment Recommendations  None recommended by PT    Recommendations for Other Services       Precautions / Restrictions Precautions Precautions: Fall;Shoulder Type of Shoulder Precautions: Ordered10/02/21 0700: OT eval and treat PROM 10 ER, 45 ABD,60 FE , PASSIVE ROM FOR ADL's ONLY, NOT for EXERCISES. OK to exercise elbow wrist and hand rom and for edema control. No pendulums, may allow arm to dangle Pt may shower. Shoulder Interventions: Shoulder sling/immobilizer;Off for dressing/bathing/exercises Precaution Booklet Issued: Yes (comment) Required Braces or Orthoses: Sling Other Brace: n/a Restrictions Weight Bearing Restrictions: Yes RUE Weight Bearing: Non weight bearing    Mobility  Bed Mobility Overal bed mobility: Needs Assistance Bed Mobility: Supine to Sit     Supine to sit: Max assist     General bed mobility comments: increased initiation today to bring LEs to EOB with max A  for trunk control  Transfers Overall transfer level: Needs assistance Equipment used: None Transfers: Sit to/from Stand Sit to Stand: Min assist;Mod assist Stand pivot transfers: Min assist       General transfer comment: assist to power up and used RW this session with therapist holding/securing R side (Pt R UE in sling) for increased stability and second assist on pt's left  Ambulation/Gait Ambulation/Gait assistance: +2 safety/equipment Gait Distance (Feet): 14 Feet Assistive device: Rolling walker (2 wheeled) Gait Pattern/deviations: Step-to pattern;Decreased step length - right;Decreased step length - left;Shuffle;Trunk flexed Gait velocity: decreased   General Gait Details: pt required +2 assist used RW with Therapist securing/holding R side (pt's R UE in sling) and 2nd assist on L side.  Very milited distance.   Stairs             Wheelchair Mobility    Modified Rankin (Stroke Patients Only)       Balance Overall balance assessment: Needs assistance;History of Falls Sitting-balance support: Feet supported Sitting balance-Leahy Scale: Fair     Standing balance support: Single extremity supported Standing balance-Leahy Scale: Poor Standing balance comment: x1 assist with HHA L UE                            Cognition Arousal/Alertness: Awake/alert Behavior During Therapy: WFL for tasks assessed/performed Overall Cognitive Status: Within Functional Limits for tasks assessed  General Comments: increased cognition this session following all commands      Exercises General Exercises - Upper Extremity Elbow Flexion: AAROM;Right;10 reps Elbow Extension: AAROM;Right;10 reps Wrist Flexion: AROM;Right;10 reps Wrist Extension: AROM;Right;10 reps Digit Composite Flexion: AROM;Right;10 reps Composite Extension: AROM;Right;10 reps Hand Exercises Forearm Supination: 10 reps;Right;AROM Forearm Pronation:  10 reps;Right;AROM    General Comments General comments (skin integrity, edema, etc.): pt reporting dizziness with exercise at EOB, BP at EOB 109/56 (72), BP in chair 119/60 (78) HR 57 and 90% O2 on 2L      Pertinent Vitals/Pain Pain Assessment: Faces Faces Pain Scale: Hurts little more Pain Location: R UE Pain Descriptors / Indicators: Grimacing;Guarding Pain Intervention(s): Monitored during session;Repositioned    Home Living                      Prior Function            PT Goals (current goals can now be found in the care plan section) Acute Rehab PT Goals Patient Stated Goal: To go to [facility] Progress towards PT goals: Progressing toward goals    Frequency    Min 3X/week      PT Plan Current plan remains appropriate    Co-evaluation              AM-PAC PT "6 Clicks" Mobility   Outcome Measure  Help needed turning from your back to your side while in a flat bed without using bedrails?: A Lot Help needed moving from lying on your back to sitting on the side of a flat bed without using bedrails?: A Lot Help needed moving to and from a bed to a chair (including a wheelchair)?: A Lot Help needed standing up from a chair using your arms (e.g., wheelchair or bedside chair)?: A Lot Help needed to walk in hospital room?: A Lot Help needed climbing 3-5 steps with a railing? : Total 6 Click Score: 11    End of Session Equipment Utilized During Treatment: Gait belt Activity Tolerance: Patient limited by fatigue Patient left: in chair;with call bell/phone within reach;with chair alarm set;with family/visitor present Nurse Communication: Mobility status PT Visit Diagnosis: Muscle weakness (generalized) (M62.81);Pain;History of falling (Z91.81);Unsteadiness on feet (R26.81) Pain - Right/Left: Right Pain - part of body: Shoulder     Time: 1434-1500 PT Time Calculation (min) (ACUTE ONLY): 26 min  Charges:  $Gait Training: 8-22 mins $Therapeutic  Activity: 8-22 mins                     Rica Koyanagi  PTA Acute  Rehabilitation Services Pager      [phone] Office      [phone]

## 2019-11-13 NOTE — Progress Notes (Signed)
PROGRESS NOTE    Michael Ware  RDE:[id number] DOB: [birth year] DOA: 11/04/2019 PCP: Alroy Dust, L.Marlou Sa, MD     Brief Narrative:  Michael Ware is an 86 year old male with past medical history significant for bradycardia, vitamin D deficiency, depression who presents to the emergency department after mechanical fall.  When EMS arrived at patient's home, patient was noted to have near syncopal episode.  Work-up revealed right humerus fracture.  Orthopedic surgery was consulted and patient underwent ORIF on 10/1.  Hospitalization further complicated by PE, DVT and hallucinations.  New events last 24 hours / Subjective: Remains confused at times, still having some hallucinations overnight. States "this is room 1405 but it's not really 1405." He is able to answer many questions correctly, remains oriented to self, place, situation, and year. He is able to recall his daughter's phone number and other highly functioning memory recall is present.   Assessment & Plan:   Active Problems:   Humerus fracture   Bradycardia   Near syncope   Depression   Lactic acidosis   AKI (acute kidney injury) (HCC)   Hyperlipidemia   Hypotension   Acute pain of left lower extremity   Rib pain on right side   Dehydration   Hypoxia   Pulmonary emboli (HCC)   Acute deep vein thrombosis (DVT) of left peroneal vein (HCC)   COPD with acute exacerbation ([place])   Presyncope -Cardiology consulted, recommended for outpatient event monitor -No further episodes of presyncope   Right humerus fracture status post mechanical fall -Status post ORIF 10/1 -PT OT recommending SNF placement  PE and left lower extremity DVT -IV heparin --> Xarelto   Delirium -Initially thought to be related to alcohol withdrawal.  Per daughter at bedside, his delirium and hallucinations started after surgery, has progressively gotten worse.  Patient does drink on a daily basis, but also has gone for weeks without alcohol in the past  without any history of delirium tremens -Patient has been in the hospital for 8 days at this point, doubt that his delirium is secondary solely to alcohol withdrawal.  He does not have any other physical signs of withdrawal such as tremors or tachycardia -CT head negative -Stop steroids as steroids can contribute to hallucinations and confusion -Delirium precautions -Stop oxycodone, hydroxyzine  -Haldol PRN -Start seroquel qhs   Mild COPD exacerbation -Without any wheezes noted.  Stop steroid burst  Bradycardia -Cardiology consulted, recommended for outpatient event monitor and follow-up with Dr. Rayann Heman in 4 weeks  AKI -Resolved   Depression -Continue Prozac  Hyperlipidemia -Continue pravachol     DVT prophylaxis:  SCD's Start: 11/07/19 1600 SCDs Start: 11/04/19 1629 Rivaroxaban (XARELTO) tablet 15 mg  rivaroxaban (XARELTO) tablet 20 mg  Code Status: DNR Family Communication: Daughter at bedside  Disposition Plan:  Status is: Inpatient  Remains inpatient appropriate because:Altered mental status   Dispo: The patient is from: Home              Anticipated d/c is to: SNF              Anticipated d/c date is: 2 days              Patient currently is not medically stable to d/c.  Remains with hallucinations and confusion, hospital delirium, not at his baseline.    Antimicrobials:  Anti-infectives (From admission, onward)   Start     Dose/Rate Route Frequency Ordered Stop   11/07/19 0600  ceFAZolin (ANCEF) IVPB 2g/100 mL premix  2 g 200 mL/hr over 30 Minutes Intravenous On call to O.R. 11/06/19 1840 11/07/19 1306       Objective: Vitals:   11/12/19 1805 11/12/19 2041 11/13/19 0540 11/13/19 0815  BP:  (!) 153/50 (!) 155/57   Pulse:  68 (!) 56   Resp:  18 17   Temp:  (!) 97.5 F (36.4 C) 98.5 F (36.9 C)   TempSrc:  Oral Oral   SpO2: 97% 97% 95% 96%  Weight:      Height:        Intake/Output Summary (Last 24 hours) at 11/13/2019 1100 Last data  filed at 11/13/2019 0601 Gross per 24 hour  Intake 200 ml  Output 1125 ml  Net -925 ml   Filed Weights   11/07/19 1015 11/10/19 0541 11/11/19 0427  Weight: 91.2 kg 96.6 kg 92.5 kg    Examination: General exam: Appears calm and comfortable  Respiratory system: Without respiratory distress, no conversational dyspnea, respiratory effort remains normal Cardiovascular system: S1 & S2 heard, RRR. No pedal edema. Gastrointestinal system: Abdomen is nondistended, soft and nontender. Normal bowel sounds heard. Central nervous system: Alert and oriented. Non focal exam. Speech clear  Extremities: Symmetric in appearance bilaterally, right upper extremity in sling Psychiatry: Judgement and insight appear poor, has episodes of hallucination and confusion still  Data Reviewed: I have personally reviewed following labs and imaging studies  CBC: Recent Labs  Lab 11/07/19 0441 11/07/19 0441 11/08/19 0621 11/08/19 0621 11/09/19 0612 11/10/19 0838 11/11/19 0454 11/12/19 0436 11/13/19 0426  WBC 10.6*   < > 11.2*   < > 12.7* 8.3 9.1 11.4* 10.7*  NEUTROABS 7.3  --  9.4*  --  8.3* 5.1  --   --   --   HGB 14.0   < > 13.6   < > 15.6 13.1 13.6 13.9 13.8  HCT 41.7   < > 40.9   < > 45.5 38.5* 40.0 40.8 40.7  MCV 108.3*   < > 108.5*   < > 107.1* 107.2* 105.0* 105.2* 107.1*  PLT 198   < > 220   < > 241 265 265 301 343   < > = values in this interval not displayed.   Basic Metabolic Panel: Recent Labs  Lab 11/07/19 0441 11/08/19 0621 11/09/19 0612 11/10/19 0838 11/11/19 0454 11/12/19 0436 11/13/19 0426  NA 136   < > 140 135 134* 138 139  K 4.2   < > 4.3 4.2 4.4 4.4 4.3  CL 99   < > 99 96* 95* 101 102  CO2 27   < > 30 30 31 27 27   GLUCOSE 121*   < > 77 93 128* 144* 96  BUN 17   < > 20 19 17 20 23   CREATININE 1.01   < > 1.02 0.85 0.87 0.87 0.92  CALCIUM 8.6*   < > 8.9 8.3* 8.3* 8.8* 8.9  MG 2.0  --   --   --  2.2 2.3  --    < > = values in this interval not displayed.   GFR: Estimated  Creatinine Clearance: 64.8 mL/min (by C-G formula based on SCr of 0.92 mg/dL). Liver Function Tests: No results for input(s): AST, ALT, ALKPHOS, BILITOT, PROT, ALBUMIN in the last 168 hours. No results for input(s): LIPASE, AMYLASE in the last 168 hours. No results for input(s): AMMONIA in the last 168 hours. Coagulation Profile: No results for input(s): INR, PROTIME in the last 168 hours. Cardiac Enzymes: No results  for input(s): CKTOTAL, CKMB, CKMBINDEX, TROPONINI in the last 168 hours. BNP (last 3 results) No results for input(s): PROBNP in the last 8760 hours. HbA1C: No results for input(s): HGBA1C in the last 72 hours. CBG: No results for input(s): GLUCAP in the last 168 hours. Lipid Profile: No results for input(s): CHOL, HDL, LDLCALC, TRIG, CHOLHDL, LDLDIRECT in the last 72 hours. Thyroid Function Tests: No results for input(s): TSH, T4TOTAL, FREET4, T3FREE, THYROIDAB in the last 72 hours. Anemia Panel: No results for input(s): VITAMINB12, FOLATE, FERRITIN, TIBC, IRON, RETICCTPCT in the last 72 hours. Sepsis Labs: No results for input(s): PROCALCITON, LATICACIDVEN in the last 168 hours.  Recent Results (from the past 240 hour(s))  Respiratory Panel by RT PCR (Flu A&B, Covid) - Nasopharyngeal Swab     Status: None   Collection Time: 11/04/19 12:49 AM   Specimen: Nasopharyngeal Swab  Result Value Ref Range Status   SARS Coronavirus 2 by RT PCR NEGATIVE NEGATIVE Final    Comment: (NOTE) SARS-CoV-2 target nucleic acids are NOT DETECTED.  The SARS-CoV-2 RNA is generally detectable in upper respiratoy specimens during the acute phase of infection. The lowest concentration of SARS-CoV-2 viral copies this assay can detect is 131 copies/mL. A negative result does not preclude SARS-Cov-2 infection and should not be used as the sole basis for treatment or other patient management decisions. A negative result may occur with  improper specimen collection/handling, submission of  specimen other than nasopharyngeal swab, presence of viral mutation(s) within the areas targeted by this assay, and inadequate number of viral copies (<131 copies/mL). A negative result must be combined with clinical observations, patient history, and epidemiological information. The expected result is Negative.  Fact Sheet for Patients:  PinkCheek.be  Fact Sheet for Healthcare Providers:  GravelBags.it  This test is no t yet approved or cleared by the [place] FDA and  has been authorized for detection and/or diagnosis of SARS-CoV-2 by FDA under an Emergency Use Authorization (EUA). This EUA will remain  in effect (meaning this test can be used) for the duration of the COVID-19 declaration under Section 564(b)(1) of the Act, 21 U.S.C. section 360bbb-3(b)(1), unless the authorization is terminated or revoked sooner.     Influenza A by PCR NEGATIVE NEGATIVE Final   Influenza B by PCR NEGATIVE NEGATIVE Final    Comment: (NOTE) The Xpert Xpress SARS-CoV-2/FLU/RSV assay is intended as an aid in  the diagnosis of influenza from Nasopharyngeal swab specimens and  should not be used as a sole basis for treatment. Nasal washings and  aspirates are unacceptable for Xpert Xpress SARS-CoV-2/FLU/RSV  testing.  Fact Sheet for Patients: PinkCheek.be  Fact Sheet for Healthcare Providers: GravelBags.it  This test is not yet approved or cleared by the [place] FDA and  has been authorized for detection and/or diagnosis of SARS-CoV-2 by  FDA under an Emergency Use Authorization (EUA). This EUA will remain  in effect (meaning this test can be used) for the duration of the  Covid-19 declaration under Section 564(b)(1) of the Act, 21  U.S.C. section 360bbb-3(b)(1), unless the authorization is  terminated or revoked. Performed at [facility], [place]  [address] [place], [place] [address]   Blood culture (routine x 2)     Status: None   Collection Time: 11/04/19  1:57 AM   Specimen: BLOOD  Result Value Ref Range Status   Specimen Description   Final    BLOOD LEFT ANTECUBITAL Performed at [facility]  [address] [place], [place] [address]    Special Requests   Final    BOTTLES DRAWN AEROBIC AND ANAEROBIC Blood Culture results may not be optimal due to an excessive volume of blood received in culture bottles Performed at [place] [address] [place], [place] [address]    Culture   Final    NO GROWTH 5 DAYS Performed at [facility], [place] [address], [place], [place] [address]    Report Status 11/09/2019 FINAL  Final  Blood culture (routine x 2)     Status: None   Collection Time: 11/04/19  9:36 AM   Specimen: BLOOD LEFT HAND  Result Value Ref Range Status   Specimen Description   Final    BLOOD LEFT HAND Performed at [place] [address] [place], [place] [address]    Special Requests   Final    BOTTLES DRAWN AEROBIC AND ANAEROBIC Blood Culture results may not be optimal due to an inadequate volume of blood received in culture bottles Performed at [place] [address], [place], [place] [address]    Culture   Final    NO GROWTH 5 DAYS Performed at [facility], [place] [address] [place], [place] [address]    Report Status 11/09/2019 FINAL  Final  Surgical pcr screen     Status: None   Collection Time: 11/06/19  4:00 PM   Specimen: Nasal Mucosa; Nasal Swab  Result Value Ref Range Status   MRSA, PCR NEGATIVE NEGATIVE Final   Staphylococcus aureus NEGATIVE NEGATIVE Final    Comment: (NOTE) The Xpert SA Assay (FDA approved for NASAL specimens in patients 65 years of age and older), is one component of a comprehensive surveillance program. It is not intended to diagnose infection nor to guide or monitor  treatment. Performed at [facility], [place] [address] [place], [place] [address]       Radiology Studies: CT HEAD WO CONTRAST  Result Date: 11/12/2019 CLINICAL DATA:  Psychosis EXAM: CT HEAD WITHOUT CONTRAST TECHNIQUE: Contiguous axial images were obtained from the base of the skull through the vertex without intravenous contrast. COMPARISON:  05/27/2017 FINDINGS: Brain: No evidence of acute infarction, hemorrhage, hydrocephalus, extra-axial collection or mass lesion/mass effect. Chronic small vessel ischemia in the cerebral white matter. 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 Generalized atrophy. Vascular: No hyperdense vessel or unexpected calcification. Skull: Normal. Negative for fracture or focal lesion. Sinuses/Orbits: No acute finding Other: Motion degraded. IMPRESSION: 1. No acute or reversible finding. 2. Chronic small vessel ischemia with progression from 2019. Electronically Signed   By: Monte Fantasia M.D.   On: 11/12/2019 11:01   VAS [place] LOWER EXTREMITY VENOUS (DVT)  Result Date: 11/12/2019  Lower Venous DVT Study Indications: Pulmonary embolism.  Risk Factors: Confirmed PE. Anticoagulation: Heparin. Limitations: Poor ultrasound/tissue interface. Comparison Study: No prior studies. Performing Technologist: Oliver Hum RVT  Examination Guidelines: A complete evaluation includes B-mode imaging, spectral Doppler, color Doppler, and power Doppler as needed of all accessible portions of each vessel. Bilateral testing is considered an integral part of a complete examination. Limited examinations for reoccurring indications may be performed as noted. The reflux portion of the exam is performed with the patient in reverse Trendelenburg.  +---------+---------------+---------+-----------+----------+--------------+ RIGHT    CompressibilityPhasicitySpontaneityPropertiesThrombus Aging  +---------+---------------+---------+-----------+----------+--------------+ CFV      Full           Yes  Yes                                 +---------+---------------+---------+-----------+----------+--------------+ SFJ      Full                                                        +---------+---------------+---------+-----------+----------+--------------+ FV Prox  Full                                                        +---------+---------------+---------+-----------+----------+--------------+ FV Mid   Full                                                        +---------+---------------+---------+-----------+----------+--------------+ FV DistalFull                                                        +---------+---------------+---------+-----------+----------+--------------+ PFV      Full                                                        +---------+---------------+---------+-----------+----------+--------------+ POP      Full           Yes      Yes                                 +---------+---------------+---------+-----------+----------+--------------+ PTV      Full                                                        +---------+---------------+---------+-----------+----------+--------------+ PERO     Full                                                        +---------+---------------+---------+-----------+----------+--------------+   +---------+---------------+---------+-----------+----------+--------------+ LEFT     CompressibilityPhasicitySpontaneityPropertiesThrombus Aging +---------+---------------+---------+-----------+----------+--------------+ CFV      Full           Yes      Yes                                 +---------+---------------+---------+-----------+----------+--------------+ SFJ      Full                                                         +---------+---------------+---------+-----------+----------+--------------+  FV Prox  Full                                                        +---------+---------------+---------+-----------+----------+--------------+ FV Mid   Full                                                        +---------+---------------+---------+-----------+----------+--------------+ FV DistalFull                                                        +---------+---------------+---------+-----------+----------+--------------+ PFV      Full                                                        +---------+---------------+---------+-----------+----------+--------------+ POP      Full           Yes      Yes                                 +---------+---------------+---------+-----------+----------+--------------+ PTV      Full                                                        +---------+---------------+---------+-----------+----------+--------------+ PERO     None                                         Acute          +---------+---------------+---------+-----------+----------+--------------+     Summary: RIGHT: - There is no evidence of deep vein thrombosis in the lower extremity.  - No cystic structure found in the popliteal fossa.  LEFT: - Findings consistent with acute deep vein thrombosis involving the left peroneal veins. - No cystic structure found in the popliteal fossa.  *See table(s) above for measurements and observations. Electronically signed by Harold Barban MD on 11/12/2019 at 9:06:29 PM.    Final       Scheduled Meds: . budesonide (PULMICORT) nebulizer solution  0.5 mg Nebulization BID  . calcium carbonate  625 mg Oral Q breakfast  . cholecalciferol  5,000 Units Oral Daily  . FLUoxetine  10 mg Oral Daily  . fluticasone  2 spray Each Nare Daily  . folic acid  1 mg Oral Daily  . guaiFENesin  1,200 mg Oral BID  . ipratropium-albuterol  3 mL Nebulization BID  .  loratadine  10 mg Oral Daily  . magnesium gluconate  500 mg Oral Daily  . multivitamin with minerals  1 tablet Oral  Daily  . pantoprazole  40 mg Oral Daily  . polyethylene glycol  17 g Oral Daily  . pravastatin  40 mg Oral Daily  . QUEtiapine  25 mg Oral QHS  . rivaroxaban  15 mg Oral BID WC   Followed by  . [START ON 12/03/2019] rivaroxaban  20 mg Oral Q supper  . senna-docusate  1 tablet Oral BID  . thiamine  100 mg Oral Daily   Or  . thiamine  100 mg Intravenous Daily  . zinc sulfate  220 mg Oral Daily   Continuous Infusions:   LOS: 9 days      Time spent: 50 minutes   Dessa Phi, DO [facility] 11/13/2019, 11:00 AM   Available via Epic secure chat 7am-7pm After these hours, please refer to coverage provider listed on [email or web address]

## 2019-11-14 DIAGNOSIS — R2681 Unsteadiness on feet: Secondary | ICD-10-CM | POA: Diagnosis not present

## 2019-11-14 DIAGNOSIS — R0902 Hypoxemia: Secondary | ICD-10-CM | POA: Diagnosis not present

## 2019-11-14 DIAGNOSIS — S42309A Unspecified fracture of shaft of humerus, unspecified arm, initial encounter for closed fracture: Secondary | ICD-10-CM | POA: Diagnosis not present

## 2019-11-14 DIAGNOSIS — I959 Hypotension, unspecified: Secondary | ICD-10-CM | POA: Diagnosis not present

## 2019-11-14 DIAGNOSIS — I119 Hypertensive heart disease without heart failure: Secondary | ICD-10-CM | POA: Diagnosis not present

## 2019-11-14 DIAGNOSIS — E782 Mixed hyperlipidemia: Secondary | ICD-10-CM | POA: Diagnosis not present

## 2019-11-14 DIAGNOSIS — Z7401 Bed confinement status: Secondary | ICD-10-CM | POA: Diagnosis not present

## 2019-11-14 DIAGNOSIS — Z4789 Encounter for other orthopedic aftercare: Secondary | ICD-10-CM | POA: Diagnosis not present

## 2019-11-14 DIAGNOSIS — R531 Weakness: Secondary | ICD-10-CM | POA: Diagnosis not present

## 2019-11-14 DIAGNOSIS — M48061 Spinal stenosis, lumbar region without neurogenic claudication: Secondary | ICD-10-CM | POA: Diagnosis not present

## 2019-11-14 DIAGNOSIS — K5790 Diverticulosis of intestine, part unspecified, without perforation or abscess without bleeding: Secondary | ICD-10-CM | POA: Diagnosis not present

## 2019-11-14 DIAGNOSIS — S42301D Unspecified fracture of shaft of humerus, right arm, subsequent encounter for fracture with routine healing: Secondary | ICD-10-CM | POA: Diagnosis not present

## 2019-11-14 DIAGNOSIS — Z4889 Encounter for other specified surgical aftercare: Secondary | ICD-10-CM | POA: Diagnosis not present

## 2019-11-14 DIAGNOSIS — R55 Syncope and collapse: Secondary | ICD-10-CM | POA: Diagnosis not present

## 2019-11-14 DIAGNOSIS — N179 Acute kidney failure, unspecified: Secondary | ICD-10-CM | POA: Diagnosis not present

## 2019-11-14 DIAGNOSIS — N138 Other obstructive and reflux uropathy: Secondary | ICD-10-CM | POA: Diagnosis not present

## 2019-11-14 DIAGNOSIS — E559 Vitamin D deficiency, unspecified: Secondary | ICD-10-CM | POA: Diagnosis not present

## 2019-11-14 DIAGNOSIS — M2550 Pain in unspecified joint: Secondary | ICD-10-CM | POA: Diagnosis not present

## 2019-11-14 DIAGNOSIS — I82452 Acute embolism and thrombosis of left peroneal vein: Secondary | ICD-10-CM | POA: Diagnosis not present

## 2019-11-14 DIAGNOSIS — Y92000 Kitchen of unspecified non-institutional (private) residence as  the place of occurrence of the external cause: Secondary | ICD-10-CM | POA: Diagnosis not present

## 2019-11-14 DIAGNOSIS — M6281 Muscle weakness (generalized): Secondary | ICD-10-CM | POA: Diagnosis not present

## 2019-11-14 DIAGNOSIS — F988 Other specified behavioral and emotional disorders with onset usually occurring in childhood and adolescence: Secondary | ICD-10-CM | POA: Diagnosis not present

## 2019-11-14 DIAGNOSIS — W19XXXD Unspecified fall, subsequent encounter: Secondary | ICD-10-CM | POA: Diagnosis not present

## 2019-11-14 DIAGNOSIS — S42351D Displaced comminuted fracture of shaft of humerus, right arm, subsequent encounter for fracture with routine healing: Secondary | ICD-10-CM | POA: Diagnosis not present

## 2019-11-14 DIAGNOSIS — M7072 Other bursitis of hip, left hip: Secondary | ICD-10-CM | POA: Diagnosis not present

## 2019-11-14 DIAGNOSIS — M79605 Pain in left leg: Secondary | ICD-10-CM | POA: Diagnosis not present

## 2019-11-14 DIAGNOSIS — S50312D Abrasion of left elbow, subsequent encounter: Secondary | ICD-10-CM | POA: Diagnosis not present

## 2019-11-14 DIAGNOSIS — E785 Hyperlipidemia, unspecified: Secondary | ICD-10-CM | POA: Diagnosis not present

## 2019-11-14 DIAGNOSIS — I2699 Other pulmonary embolism without acute cor pulmonale: Secondary | ICD-10-CM | POA: Diagnosis not present

## 2019-11-14 DIAGNOSIS — M25511 Pain in right shoulder: Secondary | ICD-10-CM | POA: Diagnosis not present

## 2019-11-14 DIAGNOSIS — D7589 Other specified diseases of blood and blood-forming organs: Secondary | ICD-10-CM | POA: Diagnosis not present

## 2019-11-14 DIAGNOSIS — Z23 Encounter for immunization: Secondary | ICD-10-CM | POA: Diagnosis not present

## 2019-11-14 DIAGNOSIS — Z7902 Long term (current) use of antithrombotics/antiplatelets: Secondary | ICD-10-CM | POA: Diagnosis not present

## 2019-11-14 DIAGNOSIS — E860 Dehydration: Secondary | ICD-10-CM | POA: Diagnosis not present

## 2019-11-14 DIAGNOSIS — Z9181 History of falling: Secondary | ICD-10-CM | POA: Diagnosis not present

## 2019-11-14 DIAGNOSIS — S42291A Other displaced fracture of upper end of right humerus, initial encounter for closed fracture: Secondary | ICD-10-CM | POA: Diagnosis not present

## 2019-11-14 DIAGNOSIS — R0781 Pleurodynia: Secondary | ICD-10-CM | POA: Diagnosis not present

## 2019-11-14 DIAGNOSIS — Y9301 Activity, walking, marching and hiking: Secondary | ICD-10-CM | POA: Diagnosis not present

## 2019-11-14 DIAGNOSIS — F329 Major depressive disorder, single episode, unspecified: Secondary | ICD-10-CM | POA: Diagnosis not present

## 2019-11-14 DIAGNOSIS — W010XXA Fall on same level from slipping, tripping and stumbling without subsequent striking against object, initial encounter: Secondary | ICD-10-CM | POA: Diagnosis not present

## 2019-11-14 DIAGNOSIS — E872 Acidosis: Secondary | ICD-10-CM | POA: Diagnosis not present

## 2019-11-14 DIAGNOSIS — R278 Other lack of coordination: Secondary | ICD-10-CM | POA: Diagnosis not present

## 2019-11-14 DIAGNOSIS — N401 Enlarged prostate with lower urinary tract symptoms: Secondary | ICD-10-CM | POA: Diagnosis not present

## 2019-11-14 DIAGNOSIS — R001 Bradycardia, unspecified: Secondary | ICD-10-CM | POA: Diagnosis not present

## 2019-11-14 DIAGNOSIS — J441 Chronic obstructive pulmonary disease with (acute) exacerbation: Secondary | ICD-10-CM | POA: Diagnosis not present

## 2019-11-14 DIAGNOSIS — S42331A Displaced oblique fracture of shaft of humerus, right arm, initial encounter for closed fracture: Secondary | ICD-10-CM | POA: Diagnosis not present

## 2019-11-14 LAB — SARS CORONAVIRUS 2 BY RT PCR (HOSPITAL ORDER, PERFORMED IN ~~LOC~~ HOSPITAL LAB): SARS Coronavirus 2: NEGATIVE

## 2019-11-14 MED ORDER — ENOXAPARIN SODIUM 100 MG/ML ~~LOC~~ SOLN
1.0000 mg/kg | Freq: Two times a day (BID) | SUBCUTANEOUS | Status: DC
  Filled 2019-11-14: qty 1

## 2019-11-14 MED ORDER — WARFARIN - PHARMACIST DOSING INPATIENT: Freq: Every day | Status: DC

## 2019-11-14 MED ORDER — RIVAROXABAN (XARELTO) VTE STARTER PACK (15 & 20 MG): ORAL_TABLET | ORAL | 0 refills | Status: DC

## 2019-11-14 MED ORDER — WARFARIN SODIUM 5 MG PO TABS: 5.0000 mg | ORAL_TABLET | Freq: Once | ORAL | Status: DC

## 2019-11-14 MED ORDER — RIVAROXABAN 15 MG PO TABS
15.0000 mg | ORAL_TABLET | Freq: Two times a day (BID) | ORAL | Status: DC
  Administered 2019-11-14: 15 mg via ORAL
  Filled 2019-11-14: qty 1

## 2019-11-14 MED ORDER — IPRATROPIUM-ALBUTEROL 0.5-2.5 (3) MG/3ML IN SOLN: 3.0000 mL | Freq: Four times a day (QID) | RESPIRATORY_TRACT | Status: DC | PRN

## 2019-11-14 MED ORDER — RIVAROXABAN 20 MG PO TABS: 20.0000 mg | ORAL_TABLET | Freq: Every day | ORAL | Status: DC

## 2019-11-14 NOTE — Progress Notes (Signed)
Physical Therapy Treatment Patient Details Name: Michael Ware MRN: [id number] DOB: September 02, 1931 Today's Date: 11/14/2019    History of Present Illness 86 yo male admitted with R midshaft humerus fx after falling at home. Pt underwent ORIF of RT humeral fracture on 11/07/19.  Please refer to new OT shoulder precautions/restrictions now s/p ORIF 11/07/19. Hx of bradycardia.  ( New Dx RIGHT lower lobe PE started Heprin Drip 11/10/19 at 20:15 )    PT Comments    Assisted OOB to bathroom General bed mobility comments: assist upper body and used bed pad to complete scooting to EOB due to dominate UE in sling.  General transfer comment: assisted from elevated as well as on/off toilet hand held assist under L armpit.  Assisted with hygiene as pt was unable to safely self perform.  Pt had a large BM.  NT assisted.  Assisted with amb out of bathroom to recliner then positioned to comfort.   Follow Up Recommendations  SNF     Equipment Recommendations  None recommended by PT    Recommendations for Other Services       Precautions / Restrictions Precautions Precautions: Fall;Shoulder Type of Shoulder Precautions: Ordered10/02/21 0700: OT eval and treat PROM 10 ER, 45 ABD,60 FE , PASSIVE ROM FOR ADL's ONLY, NOT for EXERCISES. OK to exercise elbow wrist and hand rom and for edema control. No pendulums, may allow arm to dangle Pt may shower. Shoulder Interventions: Shoulder sling/immobilizer;Off for dressing/bathing/exercises Required Braces or Orthoses: Sling Restrictions Weight Bearing Restrictions: Yes RUE Weight Bearing: Non weight bearing    Mobility  Bed Mobility Overal bed mobility: Needs Assistance Bed Mobility: Supine to Sit     Supine to sit: Max assist Sit to supine: Max assist   General bed mobility comments: assist upper body and used bed pad to complete scooting to EOB due to dominate UE in sling  Transfers Overall transfer level: Needs assistance Equipment used:  None Transfers: Sit to/from [facility] Sit to Stand: Min assist Stand pivot transfers: Min assist;Mod assist       General transfer comment: assisted from elevated as well as on/off toilet hand held assist under L armpit  Ambulation/Gait Ambulation/Gait assistance: Mod assist Gait Distance (Feet): 18 Feet Assistive device: 2 person hand held assist Gait Pattern/deviations: Step-to pattern;Decreased step length - right;Decreased step length - left;Shuffle;Trunk flexed Gait velocity: decreased   General Gait Details: asissted out of bathroom to recliner a total of 18 feet.  Unsteady. Poor forward flex posture (pt's normal due to spinal Stenosis) stated daughter.  Avg RA was 89%.  minimal dyspnea.   Stairs             Wheelchair Mobility    Modified Rankin (Stroke Patients Only)       Balance Overall balance assessment: Needs assistance;History of Falls Sitting-balance support: Feet supported Sitting balance-Leahy Scale: Fair     Standing balance support: Single extremity supported Standing balance-Leahy Scale: Poor Standing balance comment: x1 assist with HHA L UE                            Cognition Arousal/Alertness: Awake/alert Behavior During Therapy: WFL for tasks assessed/performed Overall Cognitive Status: Within Functional Limits for tasks assessed                                 General Comments: AxO X 3 motivated  Exercises   General Comments        Pertinent Vitals/Pain Pain Assessment: Faces Faces Pain Scale: Hurts a little bit Pain Location: R UE with bed mobility Pain Descriptors / Indicators: Grimacing;Guarding Pain Intervention(s): Monitored during session;Repositioned    Home Living                      Prior Function            PT Goals (current goals can now be found in the care plan section) Acute Rehab PT Goals Patient Stated Goal: To go to [facility] Progress towards PT  goals: Progressing toward goals    Frequency    Min 3X/week      PT Plan Current plan remains appropriate    Co-evaluation              AM-PAC PT "6 Clicks" Mobility   Outcome Measure  Help needed turning from your back to your side while in a flat bed without using bedrails?: A Lot Help needed moving from lying on your back to sitting on the side of a flat bed without using bedrails?: A Lot Help needed moving to and from a bed to a chair (including a wheelchair)?: A Lot Help needed standing up from a chair using your arms (e.g., wheelchair or bedside chair)?: A Lot Help needed to walk in hospital room?: A Lot Help needed climbing 3-5 steps with a railing? : Total 6 Click Score: 11    End of Session Equipment Utilized During Treatment: Gait belt Activity Tolerance: Patient limited by fatigue Patient left: in chair;with call bell/phone within reach;with chair alarm set;with family/visitor present Nurse Communication: Mobility status PT Visit Diagnosis: Muscle weakness (generalized) (M62.81);Pain;History of falling (Z91.81);Unsteadiness on feet (R26.81) Pain - Right/Left: Right Pain - part of body: Shoulder     Time: 4166-0630 PT Time Calculation (min) (ACUTE ONLY): 27 min  Charges:  $Gait Training: 8-22 mins $Therapeutic Activity: 8-22 mins                     Rica Koyanagi  PTA Acute  Rehabilitation Services Pager      [phone] Office      [phone]

## 2019-11-14 NOTE — TOC Transition Note (Addendum)
Transition of Care [facility]) - CM/SW Discharge Note   Patient Details  Name: [name] MRN: [id number] Date of Birth: 13-Nov-1931  Transition of Care [facility]) CM/SW Contact:  Dessa Phi, RN Phone Number: 11/14/2019, 9:43 AM   Clinical Narrative: d/c to [place] accepted- rm#[id number],[phone] for report. [facility] for transport. Awaiting covid results, & 02 sats to confirm if 02 needed @ SNF.  1p-[facility] called. No further CM needs.    Final next level of care: Skilled Nursing Facility Barriers to Discharge: No Barriers Identified   Patient Goals and CMS Choice Patient states their goals for this hospitalization and ongoing recovery are:: agree to fax out for SNF CMS Medicare.gov Compare Post Acute Care list provided to:: Patient Represenative (must comment) Choice offered to / list presented to : Adult Children  Discharge Placement PASRR number recieved: 11/08/19            Patient chooses bed at: [place] [place] Patient to be transferred to facility by: [place] Name of family member notified: Claiborne Billings dtr [phone] Patient and family notified of of transfer: 11/14/19  Discharge Plan and Services In-house Referral: Clinical Social Work Discharge Planning Services: [employer] Consult Post Acute Care Choice: [place]                               Social Determinants of Health (SDOH) Interventions     Readmission Risk Interventions No flowsheet data found.

## 2019-11-14 NOTE — Discharge Instructions (Signed)
Information on my medicine - XARELTO (rivaroxaban)  This medication education was reviewed with me or my healthcare representative as part of my discharge preparation.  The pharmacist that spoke with me during my hospital stay was:  Steva Colder, Student-PharmD  WHY WAS [place]? Xarelto was prescribed to treat blood clots that may have been found in the veins of your legs (deep vein thrombosis) or in your lungs (pulmonary embolism) and to reduce the risk of them occurring again.  What do you need to know about Xarelto? The starting dose is one 15 mg tablet taken TWICE daily with food for the FIRST 21 DAYS then on Wednesday 12/03/19 the dose is changed to one 20 mg tablet taken ONCE A DAY with your evening meal.  DO NOT stop taking Xarelto without talking to the health care provider who prescribed the medication.  Refill your prescription for 20 mg tablets before you run out.  After discharge, you should have regular check-up appointments with your healthcare provider that is prescribing your Xarelto.  In the future your dose may need to be changed if your kidney function changes by a significant amount.  What do you do if you miss a dose? If you are taking Xarelto TWICE DAILY and you miss a dose, take it as soon as you remember. You may take two 15 mg tablets (total 30 mg) at the same time then resume your regularly scheduled 15 mg twice daily the next day.  If you are taking Xarelto ONCE DAILY and you miss a dose, take it as soon as you remember on the same day then continue your regularly scheduled once daily regimen the next day. Do not take two doses of Xarelto at the same time.   Important Safety Information Xarelto is a blood thinner medicine that can cause bleeding. You should call your healthcare provider right away if you experience any of the following: ? Bleeding from an injury or your nose that does not stop. ? Unusual colored urine (red or dark  brown) or unusual colored stools (red or black). ? Unusual bruising for unknown reasons. ? A serious fall or if you hit your head (even if there is no bleeding).  Some medicines may interact with Xarelto and might increase your risk of bleeding while on Xarelto. To help avoid this, consult your healthcare provider or pharmacist prior to using any new prescription or non-prescription medications, including herbals, vitamins, non-steroidal anti-inflammatory drugs (NSAIDs) and supplements.  This website has more information on Xarelto: [email or web address]

## 2019-11-14 NOTE — Progress Notes (Deleted)
[place] for Lovenox/warfarin Indication: pulmonary embolus/DVT  Allergies  Allergen Reactions  . Atenolol Other (See Comments)    Caused heart rate to drop Other reaction(s): Other Caused heart rate to drop  . Other     Other reaction(s): Other Dry mouth  . Trazamine [Trazodone & Diet Manage Prod] Other (See Comments)    Dry mouth    Patient Measurements: Height: 5\' 8"  (172.7 cm) Weight: 92.5 kg (203 lb 14.4 oz) IBW/kg (Calculated) : 68.4 HEPARIN DW (KG): 87.2   Vital Signs: Temp: 97.6 F (36.4 C) (10/08 0532) Temp Source: Oral (10/08 0532) BP: 141/57 (10/08 0532) Pulse Rate: 56 (10/08 0532)  Labs: Recent Labs    11/11/19 1338 11/12/19 0436 11/13/19 0426  HGB  --  13.9 13.8  HCT  --  40.8 40.7  PLT  --  301 343  HEPARINUNFRC 0.59 0.42  --   CREATININE  --  0.87 0.92    Estimated Creatinine Clearance: 64.8 mL/min (by C-G formula based on SCr of 0.92 mg/dL).   Medical History: Past Medical History:  Diagnosis Date  . Acute GI bleeding 01/19/13   most likely due to diverticulosis  . BPH (benign prostatic hypertrophy) with urinary obstruction   . Bradycardia   . Diverticulosis   . Elevated hemoglobin A1c   . Hyperglycemia   . Hyperlipidemia   . Hypertension   . Internal hemorrhoids with bleeding and fecal soiling 11/19/2014  . Rectal bleed 01/19/13  . Vitamin D deficiency     Medications:  Infusions:   Assessment: 86 yo M admitted with right humerus fracture s/p ORIF of humerus shaft fracture on 11/07/19. CBC- Hg, pltc WNL, DDimer 5.16 No bleeding noted. Chest CT + RLL PE, no right heart strain noted.  Dopplers - positive for L DVT  11/14/2019  Patient's mental status improved today and when pharmacy was counseling patient on Xarelto, patient was very adamant that he does not want to take Xarelto. His wife used to be on warfarin and he is very comfortable with this medication. After discussion with Dr.  Maylene Roes, will change patient from Xarelto to Lovenox/warfarin  CBC has been stable  SCr has been stable   Goal of Therapy:  INR 2-3 Monitor platelets by anticoagulation protocol: Yes   Plan:  -  Start Lovenox 1mg /kg SQ q12, today will day 1 of min 5 day overlap with warfarin then can stop Lovenox when INR > 2 for 2 consecutive days - Warfarin 5mg  PO x 1 today - Would continue warfarin 5mg  daily upon discharge to SNF with recommended daily INR checks and adjustment in warfarin per levels - Daily CBC     Adrian Saran, PharmD, BCPS [phone] until 3pm 11/14/2019 9:05 AM

## 2019-11-14 NOTE — Progress Notes (Signed)
Michael Ware  MRN: [id number] DOB/Age: 1931/05/10 86 y.o. [facility] Procedure: Procedure(s) (LRB): OPEN REDUCTION INTERNAL FIXATION (ORIF) DISTAL HUMERUS FRACTURE (Right)     Subjective: Much more alert and oriented to me and location and surroundings this am  Vital Signs Temp:  [97.6 F (36.4 C)-98 F (36.7 C)] 97.6 F (36.4 C) (10/08 0532) Pulse Rate:  [54-59] 56 (10/08 0532) Resp:  [16-20] 19 (10/08 0532) BP: (125-141)/(49-57) 141/57 (10/08 0532) SpO2:  [93 %-96 %] 95 % (10/08 0532)  Lab Results Recent Labs    11/12/19 0436 11/13/19 0426  WBC 11.4* 10.7*  HGB 13.9 13.8  HCT 40.8 40.7  PLT 301 343   BMET Recent Labs    11/12/19 0436 11/13/19 0426  NA 138 139  K 4.4 4.3  CL 101 102  CO2 27 27  GLUCOSE 144* 96  BUN 20 23  CREATININE 0.87 0.92  CALCIUM 8.8* 8.9   INR  Date Value Ref Range Status  01/19/2013 1.01 0.0 - 1.4 Final     Exam Right arm dressing remains dry and intact. Moderate swelling but slowly improving        Plan Menatlly and Medically appears to be clearing, hopeful he can begin to be more active and mobilize. Agree with avoidance of all narcotics We will need to see him in office in 1 week for xrays and wound check if he should go to SNF  [facility] PA-C  11/14/2019, 8:31 AM Contact # [phone]

## 2019-11-14 NOTE — Discharge Summary (Addendum)
Physician Discharge Summary  Michael Ware Michael Ware DOB: 1931-08-18 DOA: 11/04/2019  PCP: Alroy Dust, L.Marlou Sa, MD  Admit date: 11/04/2019 Discharge date: 11/14/2019  Admitted From: Home Disposition:  SNF  Recommendations for Outpatient Follow-up:  1. Follow up with PCP in 1 week 2. Follow up with orthopedic surgery in 1 week  3. Recommended for outpatient event monitor and follow-up with Dr. Rayann Heman in 4 weeks  Discharge Condition: Stable CODE STATUS: DNR  Diet recommendation:  Diet Orders (From admission, onward)    Start     Ordered   11/07/19 1600  Diet regular Room service appropriate? Yes; Fluid consistency: Thin  Diet effective now       Question Answer Comment  Room service appropriate? Yes   Fluid consistency: Thin      11/07/19 1559         Brief/Interim Summary: Michael Ware is an 86 year old male with past medical history significant for bradycardia, vitamin D deficiency, depression who presents to the emergency department after mechanical fall.  When EMS arrived at patient's home, patient was noted to have near syncopal episode.  Work-up revealed right humerus fracture.  Orthopedic surgery was consulted and patient underwent ORIF on 10/1.  Hospitalization further complicated by PE, DVT and hallucinations. He was started on IV heparin and transitioned to Xarelto. His hallucinations stopped although he did require IV haldol PRN. He has not required this in > 24 hours. This was likely due to hospital delirium compounded by polypharmacy.   Discharge Diagnoses:  Active Problems:   Humerus fracture   Bradycardia   Near syncope   Depression   Lactic acidosis   AKI (acute kidney injury) (HCC)   Hyperlipidemia   Hypotension   Acute pain of left lower extremity   Rib pain on right side   Dehydration   Hypoxia   Pulmonary emboli (HCC)   Acute deep vein thrombosis (DVT) of left peroneal vein (HCC)   COPD with acute exacerbation ([place])   Presyncope -Cardiology  consulted, recommended for outpatient event monitor -No further episodes of presyncope   Right humerus fracture status post mechanical fall -Status post ORIF 10/1 -PT OT recommending SNF placement  PE and left lower extremity DVT -IV heparin --> Xarelto   Acute hypoxemic respiratory failure -Secondary to PE -Currently on 1-2L [place] O2, wean as able   Delirium -Initially thought to be related to alcohol withdrawal.  Per daughter at bedside, his delirium and hallucinations started after surgery, has progressively gotten worse.  Patient does drink on a daily basis, but also has gone for weeks without alcohol in the past without any history of delirium tremens -Patient has been in the hospital for 8 days at this point, doubt that his delirium is secondary solely to alcohol withdrawal.  He does not have any other physical signs of withdrawal such as tremors or tachycardia -CT head negative -Stop oxycodone, hydroxyzine. Stop steroids as steroids can contribute to hallucinations and confusion -Delirium precautions -Resolved   Mild COPD exacerbation -Without any wheezes noted.  Stop steroid burst  Bradycardia -Cardiology consulted, recommended for outpatient event monitor and follow-up with Dr. Rayann Heman in 4 weeks  AKI -Resolved   Depression -Continue Prozac  Hyperlipidemia -Continue pravachol     Discharge Instructions  Discharge Instructions    Discharge instructions   Complete by: As directed    You were cared for by a hospitalist during your hospital stay. If you have any questions about your discharge medications or the care you received while  you were in the hospital after you are discharged, you can call the unit and ask to speak with the hospitalist on call if the hospitalist that took care of you is not available. Once you are discharged, your primary care physician will handle any further medical issues. Please note that NO REFILLS for any discharge medications will  be authorized once you are discharged, as it is imperative that you return to your primary care physician (or establish a relationship with a primary care physician if you do not have one) for your aftercare needs so that they can reassess your need for medications and monitor your lab values.   Increase activity slowly   Complete by: As directed    No wound care   Complete by: As directed      Allergies as of 11/14/2019      Reactions   Atenolol Other (See Comments)   Caused heart rate to drop Other reaction(s): Other Caused heart rate to drop   Other    Other reaction(s): Other Dry mouth   Trazamine [trazodone & Diet Manage Prod] Other (See Comments)   Dry mouth      Medication List    STOP taking these medications   diazepam 10 MG tablet Commonly known as: VALIUM   HYDROcodone-acetaminophen 5-325 MG tablet Commonly known as: NORCO/VICODIN   methocarbamol 750 MG tablet Commonly known as: ROBAXIN   naproxen 500 MG tablet Commonly known as: NAPROSYN     TAKE these medications   B-complex with vitamin C tablet Take 1 tablet by mouth daily.   calcium carbonate 600 MG Tabs tablet Commonly known as: OS-CAL Take 600 mg by mouth daily.   CRANBERRY EXTRACT PO Take 1 capsule by mouth daily.   FLUoxetine 10 MG capsule Commonly known as: PROZAC Take 10 mg by mouth daily.   Garlic 10 MG Caps Take 10 mg by mouth daily.   magnesium gluconate 500 MG tablet Commonly known as: MAGONATE Take 500 mg by mouth daily.   naproxen sodium 220 MG tablet Commonly known as: ALEVE Take 440 mg by mouth daily as needed (back pain).   pravastatin 40 MG tablet Commonly known as: PRAVACHOL Take 40 mg by mouth daily.   Rivaroxaban Stater Pack (15 mg and 20 mg) Commonly known as: XARELTO STARTER PACK Follow package directions: Take one 15mg  tablet by mouth twice a day. On day 22, switch to one 20mg  tablet once a day. Take with food.   SAW PALMETTO PO Take 1 capsule by mouth  daily.   SELENIMIN PO Take 1 capsule by mouth daily.   VITAMIN B-12 CR PO Take 1 tablet by mouth daily.   Vitamin D3 125 MCG (5000 UT) Caps Take 5,000 Units by mouth daily.   Zinc 50 MG Caps Take 50 mg by mouth daily.       Contact information for follow-up providers    Thompson Grayer, MD Follow up on 12/05/2019.   Specialty: Cardiology Why: Our office will call you to arrange heart monitor and follow-up visit. Appointment time at [facility] information: [address] [place] [address] [phone]        Justice Britain, MD Follow up.   Specialty: Orthopedic Surgery Why: in 1 week Contact information: [address] [place] [address] [phone]        Alroy Dust, L.Marlou Sa, MD. Schedule an appointment as soon as possible for a visit in 1 week(s).   Specialty: Family Medicine Contact information: [phone]  [address] [place] [address] [phone]            Contact information for after-discharge care    Destination    [facility] .   Service: Skilled Nursing Contact information: [place] [phone]                 Allergies  Allergen Reactions  . Atenolol Other (See Comments)    Caused heart rate to drop Other reaction(s): Other Caused heart rate to drop  . Other     Other reaction(s): Other Dry mouth  . Trazamine [Trazodone & Diet Manage Prod] Other (See Comments)    Dry mouth    Procedures/Studies: DG Ribs Bilateral W/Chest  Result Date: 11/08/2019 CLINICAL DATA:  Fall, rib pain EXAM: BILATERAL RIBS AND CHEST - 4+ VIEW COMPARISON:  CT chest, 06/02/2019 FINDINGS: No acute fracture or other bone lesions are seen involving the ribs. There is a chronic fracture of the distal left seventh rib, seen on prior CT. There is no evidence of pneumothorax or pleural effusion. Both lungs are clear. Heart size and mediastinal  contours are within normal limits. IMPRESSION: No acute rib fracture. Chronic fracture of the distal left seventh rib. Electronically Signed   By: Eddie Candle M.D.   On: 11/08/2019 16:43   CT HEAD WO CONTRAST  Result Date: 11/12/2019 CLINICAL DATA:  Psychosis EXAM: CT HEAD WITHOUT CONTRAST TECHNIQUE: Contiguous axial images were obtained from the base of the skull through the vertex without intravenous contrast. COMPARISON:  05/27/2017 FINDINGS: Brain: No evidence of acute infarction, hemorrhage, hydrocephalus, extra-axial collection or mass lesion/mass effect. Chronic small vessel ischemia in the cerebral white matter. 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 Generalized atrophy. Vascular: No hyperdense vessel or unexpected calcification. Skull: Normal. Negative for fracture or focal lesion. Sinuses/Orbits: No acute finding Other: Motion degraded. IMPRESSION: 1. No acute or reversible finding. 2. Chronic small vessel ischemia with progression from 2019. Electronically Signed   By: Monte Fantasia M.D.   On: 11/12/2019 11:01   CT ANGIO CHEST PE W OR WO CONTRAST  Result Date: 11/10/2019 CLINICAL DATA:  Chest pain and shortness of breath EXAM: CT ANGIOGRAPHY CHEST WITH CONTRAST TECHNIQUE: Multidetector CT imaging of the chest was performed using the standard protocol during bolus administration of intravenous contrast. Multiplanar CT image reconstructions and MIPs were obtained to evaluate the vascular anatomy. CONTRAST:  133mL OMNIPAQUE IOHEXOL 350 MG/ML SOLN COMPARISON:  Plain film from earlier in the same day. FINDINGS: Cardiovascular: Thoracic aorta demonstrates atherosclerotic calcifications without aneurysmal dilatation or dissection. Pulmonary artery is well visualized within normal branching pattern. Filling defects are noted within the lower lobe pulmonary arteries on the right consistent with acute pulmonary emboli. Coronary calcifications are noted. Mild  cardiac enlargement is seen. Mediastinum/Nodes: Thoracic inlet is within normal limits. Scattered likely reactive mediastinal adenopathy is noted. The esophagus is within normal limits. Lungs/Pleura: Right-sided pleural effusion is noted. Right basilar consolidation is noted consistent with the known lower lobe pulmonary emboli. Of emphysematous changes are seen. Upper Abdomen: Visualized upper abdomen demonstrates multiple calcified granulomas in the spleen. Musculoskeletal: Degenerative changes of the thoracic spine are noted. No acute bony abnormality is seen. Review of the MIP images confirms the above findings. IMPRESSION: Right lower lobe pulmonary emboli with associated effusion and consolidation. No right heart strain is noted Changes of prior granulomatous disease. Electronically Signed   By: Elta Guadeloupe  Lukens M.D.   On: 11/10/2019 19:02   DG CHEST PORT 1 VIEW  Result Date: 11/10/2019 CLINICAL DATA:  Hypoxia EXAM: PORTABLE CHEST 1 VIEW COMPARISON:  Two days ago FINDINGS: Generalized interstitial coarsening without Kerley lines, likely bronchitic. No effusion or pneumothorax. No focal consolidation, right infrahilar density is likely summation shadows based on prior. Borderline heart size. Atherosclerotic calcification. IMPRESSION: Bronchitic markings without definite pneumonia. Electronically Signed   By: Monte Fantasia M.D.   On: 11/10/2019 08:24   DG Chest Portable 1 View  Result Date: 11/04/2019 CLINICAL DATA:  Fall with hypoxia EXAM: PORTABLE CHEST 1 VIEW COMPARISON:  None. FINDINGS: The heart size and mediastinal contours are within normal limits. Both lungs are clear. There is left basilar atelectasis. The visualized skeletal structures are unremarkable. IMPRESSION: No active disease. Electronically Signed   By: Ulyses Jarred M.D.   On: 11/04/2019 02:52   DG Knee Complete 4 Views Left  Result Date: 11/04/2019 CLINICAL DATA:  Fall EXAM: LEFT KNEE - COMPLETE 4+ VIEW COMPARISON:  None. FINDINGS:  No evidence of fracture, dislocation, or joint effusion. No evidence of arthropathy or other focal bone abnormality. Soft tissues are unremarkable. IMPRESSION: Negative. Electronically Signed   By: Ulyses Jarred M.D.   On: 11/04/2019 02:29   DG Humerus Right  Result Date: 11/07/2019 CLINICAL DATA:  Right humerus ORIF EXAM: RIGHT HUMERUS - 2+ VIEW COMPARISON:  11/06/2019 FINDINGS: 3 C-arm fluoroscopic images were obtained intraoperatively and submitted for post operative interpretation. Interval placement of lateral sideplate and screw fixation construct traversing right humeral diaphyseal fracture. Near anatomic alignment. 11.8 seconds fluoroscopy time utilized. Please see the performing provider's procedural report for further detail. IMPRESSION: As above. Electronically Signed   By: Davina Poke D.O.   On: 11/07/2019 15:02   DG Humerus Right  Result Date: 11/06/2019 CLINICAL DATA:  Fracture EXAM: RIGHT HUMERUS - 2+ VIEW COMPARISON:  November 05, 2019 FINDINGS: Patient was imaged in brace, upright and positioning. There is an obliquely oriented fracture through the junction of the proximal and mid thirds of the right humerus. On the frontal view, there is 1.6 cm of lateral displacement of the distal fracture fragment with respect proximal fragment. On lateral view, there is 2.6 cm of posterior displacement of the distal fracture fragment respect proximal fragment. No dislocation evident. Joint spaces appear unremarkable. No erosion. IMPRESSION: Obliquely oriented fracture junction of proximal and mid thirds of humerus with significant displacement of fracture fragments as noted. No dislocation. No appreciable underlying arthropathy. Electronically Signed   By: Lowella Grip III M.D.   On: 11/06/2019 13:21   DG Humerus Right  Result Date: 11/05/2019 CLINICAL DATA:  Recent fall with humeral fracture EXAM: RIGHT HUMERUS - 2+ VIEW COMPARISON:  Films from the previous day FINDINGS: Midshaft humeral  fracture is again identified with significant displacement at the fracture site. No soft tissue abnormality is noted. IMPRESSION: Significant displacement at the fracture site in the mid humerus. The overall appearance is similar to that seen on the previous day. Electronically Signed   By: Inez Catalina M.D.   On: 11/05/2019 13:13   DG Humerus Right  Result Date: 11/04/2019 CLINICAL DATA:  Fall EXAM: RIGHT HUMERUS - 2+ VIEW COMPARISON:  None. FINDINGS: There is a laterally and posteriorly displaced oblique fracture of the midshaft right humerus. There is a comminuted fracture of the right humeral head with minimal displacement. No visible dislocation, though positioning is nonstandard. IMPRESSION: 1. Laterally and posteriorly displaced oblique fracture of the midshaft  right humerus. 2. Comminuted fracture of the right humeral head. Electronically Signed   By: Ulyses Jarred M.D.   On: 11/04/2019 02:26   DG C-Arm 1-60 Min-No Report  Result Date: 11/07/2019 Fluoroscopy was utilized by the requesting physician.  No radiographic interpretation.   ECHOCARDIOGRAM COMPLETE  Result Date: 11/04/2019    ECHOCARDIOGRAM REPORT   Patient Name:   Michael Ware Date of Exam: 11/04/2019 Medical Rec #:  [id number]      Height:       68.0 in Accession #:    [id number]     Weight:       201.1 lb Date of Birth:  06-11-1931      BSA:          2.048 m Patient Age:    72 years       BP:           104/93 mmHg Patient Gender: M              HR:           56 bpm. Exam Location:  Inpatient Procedure: 2D Echo and Intracardiac Opacification Agent Indications:    Near syncope [[id number]]                 Bradycardia [[id number]]  History:        Patient has no prior history of Echocardiogram examinations.                 Risk Factors:Hypertension and Dyslipidemia.  Sonographer:    Darlina Sicilian RDCS Referring Phys: [id number] Darreld Mclean  Sonographer Comments: Technically difficult study due to poor echo windows. IMPRESSIONS  1. Very  technically difficult study with poor echo windows, Definity contrast given  2. Left ventricular ejection fraction, by estimation, is 70 to 75%. The left ventricle has hyperdynamic function. The left ventricle has no regional wall motion abnormalities. There is mild left ventricular hypertrophy. Left ventricular diastolic parameters are consistent with Grade I diastolic dysfunction (impaired relaxation).  3. Right ventricular systolic function is normal. The right ventricular size is normal.  4. The mitral valve was not well visualized. No evidence of mitral valve regurgitation.  5. The aortic valve was not well visualized. Aortic valve regurgitation is not visualized. FINDINGS  Left Ventricle: Left ventricular ejection fraction, by estimation, is 70 to 75%. The left ventricle has hyperdynamic function. The left ventricle has no regional wall motion abnormalities. Definity contrast agent was given IV to delineate the left ventricular endocardial borders. The left ventricular internal cavity size was normal in size. There is mild left ventricular hypertrophy. Left ventricular diastolic parameters are consistent with Grade I diastolic dysfunction (impaired relaxation). Indeterminate filling pressures. Right Ventricle: The right ventricular size is normal. No increase in right ventricular wall thickness. Right ventricular systolic function is normal. Left Atrium: Left atrial size was normal in size. Right Atrium: Right atrial size was normal in size. Pericardium: There is no evidence of pericardial effusion. Mitral Valve: The mitral valve was not well visualized. No evidence of mitral valve regurgitation. Tricuspid Valve: The tricuspid valve is not well visualized. Tricuspid valve regurgitation is not demonstrated. Aortic Valve: The aortic valve was not well visualized. Aortic valve regurgitation is not visualized. Pulmonic Valve: The pulmonic valve was not well visualized. Pulmonic valve regurgitation is not  visualized. Aorta: Aortic root could not be assessed. Venous: The inferior vena cava was not well visualized. IAS/Shunts: The interatrial septum was not well visualized.  LEFT VENTRICLE PLAX  2D LVIDd:         4.90 cm  Diastology LVIDs:         2.20 cm  LV e' medial:    4.90 cm/s LV PW:         1.10 cm  LV E/e' medial:  11.6 LV IVS:        1.10 cm  LV e' lateral:   5.00 cm/s LVOT diam:     2.10 cm  LV E/e' lateral: 11.3 LV SV:         73 LV SV Index:   36 LVOT Area:     3.46 cm  RIGHT VENTRICLE RV S prime:     17.10 cm/s TAPSE (M-mode): 2.8 cm LEFT ATRIUM             Index LA diam:        3.70 cm 1.81 cm/m LA Vol (A2C):   31.7 ml 15.48 ml/m LA Vol (A4C):   45.5 ml 22.21 ml/m LA Biplane Vol: 40.7 ml 19.87 ml/m  AORTIC VALVE LVOT Vmax:   109.00 cm/s LVOT Vmean:  76.300 cm/s LVOT VTI:    0.211 m  AORTA Ao Root diam: 3.00 cm MITRAL VALVE MV Area (PHT): 1.84 cm     SHUNTS MV Decel Time: 412 msec     Systemic VTI:  0.21 m MV E velocity: 56.60 cm/s   Systemic Diam: 2.10 cm MV A velocity: 109.00 cm/s MV E/A ratio:  0.52 Lyman Bishop MD Electronically signed by Lyman Bishop MD Signature Date/Time: 11/04/2019/12:19:32 PM    Final    DG HIP UNILAT WITH PELVIS 2-3 VIEWS LEFT  Result Date: 11/08/2019 CLINICAL DATA:  Left hip pain. EXAM: DG HIP (WITH OR WITHOUT PELVIS) 2-3V LEFT COMPARISON:  None. FINDINGS: There is no evidence of hip fracture or dislocation. There is no evidence of arthropathy or other focal bone abnormality. IMPRESSION: Negative. Electronically Signed   By: Marijo Conception M.D.   On: 11/08/2019 16:44   DG FEMUR MIN 2 VIEWS LEFT  Result Date: 11/08/2019 CLINICAL DATA:  Fall, pain EXAM: LEFT FEMUR 2 VIEWS COMPARISON:  None. FINDINGS: Osteopenia. No fracture or dislocation of the left femur. Vascular calcinosis. IMPRESSION: No fracture or dislocation of the left femur. Electronically Signed   By: Eddie Candle M.D.   On: 11/08/2019 16:41   VAS [place] LOWER EXTREMITY VENOUS (DVT)  Result Date:  11/12/2019  Lower Venous DVT Study Indications: Pulmonary embolism.  Risk Factors: Confirmed PE. Anticoagulation: Heparin. Limitations: Poor ultrasound/tissue interface. Comparison Study: No prior studies. Performing Technologist: Oliver Hum RVT  Examination Guidelines: A complete evaluation includes B-mode imaging, spectral Doppler, color Doppler, and power Doppler as needed of all accessible portions of each vessel. Bilateral testing is considered an integral part of a complete examination. Limited examinations for reoccurring indications may be performed as noted. The reflux portion of the exam is performed with the patient in reverse Trendelenburg.  +---------+---------------+---------+-----------+----------+--------------+ RIGHT    CompressibilityPhasicitySpontaneityPropertiesThrombus Aging +---------+---------------+---------+-----------+----------+--------------+ CFV      Full           Yes      Yes                                 +---------+---------------+---------+-----------+----------+--------------+ SFJ      Full                                                        +---------+---------------+---------+-----------+----------+--------------+  FV Prox  Full                                                        +---------+---------------+---------+-----------+----------+--------------+ FV Mid   Full                                                        +---------+---------------+---------+-----------+----------+--------------+ FV DistalFull                                                        +---------+---------------+---------+-----------+----------+--------------+ PFV      Full                                                        +---------+---------------+---------+-----------+----------+--------------+ POP      Full           Yes      Yes                                  +---------+---------------+---------+-----------+----------+--------------+ PTV      Full                                                        +---------+---------------+---------+-----------+----------+--------------+ PERO     Full                                                        +---------+---------------+---------+-----------+----------+--------------+   +---------+---------------+---------+-----------+----------+--------------+ LEFT     CompressibilityPhasicitySpontaneityPropertiesThrombus Aging +---------+---------------+---------+-----------+----------+--------------+ CFV      Full           Yes      Yes                                 +---------+---------------+---------+-----------+----------+--------------+ SFJ      Full                                                        +---------+---------------+---------+-----------+----------+--------------+ FV Prox  Full                                                        +---------+---------------+---------+-----------+----------+--------------+  FV Mid   Full                                                        +---------+---------------+---------+-----------+----------+--------------+ FV DistalFull                                                        +---------+---------------+---------+-----------+----------+--------------+ PFV      Full                                                        +---------+---------------+---------+-----------+----------+--------------+ POP      Full           Yes      Yes                                 +---------+---------------+---------+-----------+----------+--------------+ PTV      Full                                                        +---------+---------------+---------+-----------+----------+--------------+ PERO     None                                         Acute           +---------+---------------+---------+-----------+----------+--------------+     Summary: RIGHT: - There is no evidence of deep vein thrombosis in the lower extremity.  - No cystic structure found in the popliteal fossa.  LEFT: - Findings consistent with acute deep vein thrombosis involving the left peroneal veins. - No cystic structure found in the popliteal fossa.  *See table(s) above for measurements and observations. Electronically signed by Harold Barban MD on 11/12/2019 at 9:06:29 PM.    Final        Discharge Exam: Vitals:   11/13/19 2052 11/14/19 0532  BP: (!) 131/50 (!) 141/57  Pulse: (!) 54 (!) 56  Resp: 16 19  Temp: 97.7 F (36.5 C) 97.6 F (36.4 C)  SpO2: 96% 95%    General: Pt is alert, awake, not in acute distress Cardiovascular: RRR, S1/S2 +, no edema Respiratory: CTA bilaterally, no wheezing, no rhonchi, no respiratory distress, no conversational dyspnea  Abdominal: Soft, NT, ND, bowel sounds + Extremities: no edema, no cyanosis Psych: Normal mood and affect, stable judgement and insight     The results of significant diagnostics from this hospitalization (including imaging, microbiology, ancillary and laboratory) are listed below for reference.     Microbiology: Recent Results (from the past 240 hour(s))  Blood culture (routine x 2)     Status: None   Collection Time: 11/04/19  9:36 AM   Specimen: BLOOD LEFT HAND  Result Value Ref  Range Status   Specimen Description   Final    BLOOD LEFT HAND Performed at [facility] [address], [place], [place] [address]    Special Requests   Final    BOTTLES DRAWN AEROBIC AND ANAEROBIC Blood Culture results may not be optimal due to an inadequate volume of blood received in culture bottles Performed at [facility] [address] [place], [place] [address]    Culture   Final    NO GROWTH 5 DAYS Performed at [facility], [place] [address], [place], [place] [address]     Report Status 11/09/2019 FINAL  Final  Surgical pcr screen     Status: None   Collection Time: 11/06/19  4:00 PM   Specimen: Nasal Mucosa; Nasal Swab  Result Value Ref Range Status   MRSA, PCR NEGATIVE NEGATIVE Final   Staphylococcus aureus NEGATIVE NEGATIVE Final    Comment: (NOTE) The Xpert SA Assay (FDA approved for NASAL specimens in patients 49 years of age and older), is one component of a comprehensive surveillance program. It is not intended to diagnose infection nor to guide or monitor treatment. Performed at [facility], [place] [address] [place], [place] [address]      Labs: BNP (last 3 results) Recent Labs    11/10/19 0838  BNP 84.6   Basic Metabolic Panel: Recent Labs  Lab 11/09/19 0612 11/10/19 0838 11/11/19 0454 11/12/19 0436 11/13/19 0426  NA 140 135 134* 138 139  K 4.3 4.2 4.4 4.4 4.3  CL 99 96* 95* 101 102  CO2 30 30 31 27 27   GLUCOSE 77 93 128* 144* 96  BUN 20 19 17 20 23   CREATININE 1.02 0.85 0.87 0.87 0.92  CALCIUM 8.9 8.3* 8.3* 8.8* 8.9  MG  --   --  2.2 2.3  --    Liver Function Tests: No results for input(s): AST, ALT, ALKPHOS, BILITOT, PROT, ALBUMIN in the last 168 hours. No results for input(s): LIPASE, AMYLASE in the last 168 hours. No results for input(s): AMMONIA in the last 168 hours. CBC: Recent Labs  Lab 11/08/19 0621 11/08/19 0621 11/09/19 0612 11/10/19 0838 11/11/19 0454 11/12/19 0436 11/13/19 0426  WBC 11.2*   < > 12.7* 8.3 9.1 11.4* 10.7*  NEUTROABS 9.4*  --  8.3* 5.1  --   --   --   HGB 13.6   < > 15.6 13.1 13.6 13.9 13.8  HCT 40.9   < > 45.5 38.5* 40.0 40.8 40.7  MCV 108.5*   < > 107.1* 107.2* 105.0* 105.2* 107.1*  PLT 220   < > 241 265 265 301 343   < > = values in this interval not displayed.   Cardiac Enzymes: No results for input(s): CKTOTAL, CKMB, CKMBINDEX, TROPONINI in the last 168 hours. BNP: Invalid input(s): POCBNP CBG: No results for input(s): GLUCAP in the last 168  hours. D-Dimer No results for input(s): DDIMER in the last 72 hours. Hgb A1c No results for input(s): HGBA1C in the last 72 hours. Lipid Profile No results for input(s): CHOL, HDL, LDLCALC, TRIG, CHOLHDL, LDLDIRECT in the last 72 hours. Thyroid function studies No results for input(s): TSH, T4TOTAL, T3FREE, THYROIDAB in the last 72 hours.  Invalid input(s): FREET3 Anemia work up No results for input(s): VITAMINB12, FOLATE, FERRITIN, TIBC, IRON, RETICCTPCT in the last 72 hours. Urinalysis    Component Value Date/Time   COLORURINE YELLOW 11/04/2019 0402   APPEARANCEUR HAZY (A) 11/04/2019 0402  LABSPEC 1.012 11/04/2019 0402   PHURINE 5.0 11/04/2019 0402   GLUCOSEU NEGATIVE 11/04/2019 0402   HGBUR NEGATIVE 11/04/2019 0402   BILIRUBINUR NEGATIVE 11/04/2019 0402   KETONESUR NEGATIVE 11/04/2019 0402   PROTEINUR NEGATIVE 11/04/2019 0402   NITRITE NEGATIVE 11/04/2019 0402   LEUKOCYTESUR NEGATIVE 11/04/2019 0402   Sepsis Labs Invalid input(s): PROCALCITONIN,  WBC,  LACTICIDVEN Microbiology Recent Results (from the past 240 hour(s))  Blood culture (routine x 2)     Status: None   Collection Time: 11/04/19  9:36 AM   Specimen: BLOOD LEFT HAND  Result Value Ref Range Status   Specimen Description   Final    BLOOD LEFT HAND Performed at [facility], [place] [address], [place], [place] [address]    Special Requests   Final    BOTTLES DRAWN AEROBIC AND ANAEROBIC Blood Culture results may not be optimal due to an inadequate volume of blood received in culture bottles Performed at [facility] [address] [place], [place] [address]    Culture   Final    NO GROWTH 5 DAYS Performed at [facility], [place] [address] [place], [place] [address]    Report Status 11/09/2019 FINAL  Final  Surgical pcr screen     Status: None   Collection Time: 11/06/19  4:00 PM   Specimen: Nasal Mucosa; Nasal Swab  Result Value Ref Range Status   MRSA, PCR  NEGATIVE NEGATIVE Final   Staphylococcus aureus NEGATIVE NEGATIVE Final    Comment: (NOTE) The Xpert SA Assay (FDA approved for NASAL specimens in patients 73 years of age and older), is one component of a comprehensive surveillance program. It is not intended to diagnose infection nor to guide or monitor treatment. Performed at [facility], [place] [address], [place], [place] [address]      Patient was seen and examined on the day of discharge and was found to be in stable condition. Time coordinating discharge: 40 minutes including assessment and coordination of care, as well as examination of the patient.   SIGNED:  Dessa Phi, DO [facility] 11/14/2019, 9:32 AM

## 2019-11-14 NOTE — Progress Notes (Signed)
Occupational Therapy Treatment Patient Details Name: Michael Ware MRN: [id number] DOB: 25-Dec-1931 Today's Date: 11/14/2019    History of present illness 86 yo male admitted with R midshaft humerus fx after falling at home. Pt underwent ORIF of RT humeral fracture on 11/07/19.  Please refer to new OT shoulder precautions/restrictions now s/p ORIF 11/07/19. Hx of bradycardia.  ( New Dx RIGHT lower lobe PE started Heprin Drip 11/10/19 at 20:15 )   OT comments  Continued max encouragement of fist pumps hand/wrist ROM as able for edema management. Patient with less pain/facial grimace with AAROM elbow flexion/extension this date. Min A for stand pivot from recliner to bed and max A for repositioning/bed mobility. Plan to D/C to rehab today.   Follow Up Recommendations  SNF;Supervision/Assistance - 24 hour    Equipment Recommendations  None recommended by OT       Precautions / Restrictions Precautions Precautions: Fall;Shoulder Type of Shoulder Precautions: Ordered10/02/21 0700: OT eval and treat PROM 10 ER, 45 ABD,60 FE , PASSIVE ROM FOR ADL's ONLY, NOT for EXERCISES. OK to exercise elbow wrist and hand rom and for edema control. No pendulums, may allow arm to dangle Pt may shower. Shoulder Interventions: Shoulder sling/immobilizer;Off for dressing/bathing/exercises Required Braces or Orthoses: Sling Restrictions Weight Bearing Restrictions: Yes RUE Weight Bearing: Non weight bearing       Mobility Bed Mobility Overal bed mobility: Needs Assistance Bed Mobility: Sit to Supine       Sit to supine: Max assist   General bed mobility comments: difficulty positioning shoulders to midline, assist to lift LEs onto bed  Transfers Overall transfer level: Needs assistance Equipment used: None Transfers: Sit to/from [facility] Sit to Stand: Min assist Stand pivot transfers: Min assist       General transfer comment: min A to power up from recliner chair and take few  steps to EOB, mod A for eccentric control onto bed difficulty reaching back with L UE to assist    Balance Overall balance assessment: Needs assistance;History of Falls Sitting-balance support: Feet supported Sitting balance-Leahy Scale: Fair     Standing balance support: Single extremity supported Standing balance-Leahy Scale: Poor Standing balance comment: x1 assist with HHA L UE                           ADL either performed or assessed with clinical judgement   ADL Overall ADL's : Needs assistance/impaired                         Toilet Transfer: Minimal assistance;Cueing for safety;Cueing for sequencing;Stand-pivot Toilet Transfer Details (indicate cue type and reason): recliner back to bed, min A to power up to standing, decreased eccentric control onto bed                           Cognition Arousal/Alertness: Awake/alert Behavior During Therapy: WFL for tasks assessed/performed Overall Cognitive Status: Within Functional Limits for tasks assessed                                          Exercises General Exercises - Upper Extremity Elbow Flexion: AAROM;Right;10 reps Elbow Extension: AAROM;Right;10 reps Wrist Flexion: AROM;Right;10 reps Wrist Extension: AROM;Right;10 reps Digit Composite Flexion: AROM;Right;10 reps Composite Extension: AROM;Right;10 reps Hand Exercises Forearm Supination: 10  reps;Right;AROM Forearm Pronation: 10 reps;Right;AROM Other Exercises Other Exercises: continued max encouragement of fist pumps for edema management   Shoulder Instructions Shoulder Instructions Donning/doffing sling/immobilizer: Maximal assistance Correct positioning of sling/immobilizer: Maximal assistance          Pertinent Vitals/ Pain       Pain Assessment: Faces Faces Pain Scale: Hurts even more Pain Location: R UE with bed mobility Pain Descriptors / Indicators: Grimacing;Guarding Pain Intervention(s): Monitored  during session         Frequency  Min 2X/week        Progress Toward Goals  OT Goals(current goals can now be found in the care plan section)  Progress towards OT goals: Progressing toward goals  Acute Rehab OT Goals Patient Stated Goal: To go to [facility] OT Goal Formulation: With patient Time For Goal Achievement: 11/22/19 Potential to Achieve Goals: Good  Plan Discharge plan remains appropriate       AM-PAC OT "6 Clicks" Daily Activity     Outcome Measure   Help from another person eating meals?: A Little Help from another person taking care of personal grooming?: A Little Help from another person toileting, which includes using toliet, bedpan, or urinal?: A Lot Help from another person bathing (including washing, rinsing, drying)?: A Lot Help from another person to put on and taking off regular upper body clothing?: A Lot Help from another person to put on and taking off regular lower body clothing?: A Lot 6 Click Score: 14    End of Session Equipment Utilized During Treatment: Other (comment) (sling)  OT Visit Diagnosis: Unsteadiness on feet (R26.81);History of falling (Z91.81);Pain Pain - Right/Left: Right Pain - part of body: Arm   Activity Tolerance Patient tolerated treatment well   Patient Left in bed;with call bell/phone within reach;with nursing/sitter in room   Nurse Communication Mobility status        Time: 8022-3361 OT Time Calculation (min): 25 min  Charges: OT General Charges $OT Visit: 1 Visit OT Treatments $Self Care/Home Management : 8-22 mins $Therapeutic Exercise: 8-22 mins  Delbert Phenix OT OT pager: [place] 11/14/2019, 1:00 PM

## 2019-11-19 ENCOUNTER — Encounter: Payer: Self-pay | Admitting: *Deleted

## 2019-11-19 ENCOUNTER — Telehealth: Payer: Self-pay | Admitting: *Deleted

## 2019-11-19 NOTE — Telephone Encounter (Signed)
Patient enrolled for [employer] 30 day cardiac event monitor to be shipped to : [facility], Attn: Aldona Lento [place], [place] , [place] [address] [phone] Letter with instructions mailed to same address.

## 2019-11-20 ENCOUNTER — Other Ambulatory Visit: Payer: Self-pay | Admitting: *Deleted

## 2019-11-20 NOTE — Patient Outreach (Signed)
Member screened for potential [facility] Care Management needs as a benefit of [employer] Medicare.  Per Patient Ping member resides in [facility].   Communication sent to [facility] SW to request update on transtion plans.   Will continue to follow while member resides in SNF.  Jamielyn Petrucci, MSN-Ed, RN,BSN [facility] Post Acute Care Coordinator [phone] ( Business Mobile) [phone]  (Toll free office)  

## 2019-11-24 ENCOUNTER — Encounter (HOSPITAL_COMMUNITY): Payer: Self-pay | Admitting: Orthopedic Surgery

## 2019-11-24 DIAGNOSIS — Z4789 Encounter for other orthopedic aftercare: Secondary | ICD-10-CM | POA: Diagnosis not present

## 2019-11-28 ENCOUNTER — Other Ambulatory Visit: Payer: Self-pay | Admitting: *Deleted

## 2019-11-28 NOTE — Patient Outreach (Signed)
[facility] Post- Acute Care Coordinator follow up. Member screened for potential [facility] Care Management needs as a benefit of [place] Medicare.  Update previously received from [facility] SW indicating member will transition to home next Wednesday. Will have family support.   Will plan outreach to discuss [place] Management services.   [name], MSN-Ed, RN,BSN [place] Acute Care Coordinator [phone] [facility]) [phone]  (Toll free office)

## 2019-12-01 ENCOUNTER — Telehealth: Payer: Self-pay | Admitting: Internal Medicine

## 2019-12-01 NOTE — Telephone Encounter (Signed)
    Kelly from [facility] calling, she said pt doesn't want to go to the appt and doesn't want to wear heart monitor so she will just send the monitor back

## 2019-12-02 ENCOUNTER — Other Ambulatory Visit: Payer: Self-pay | Admitting: *Deleted

## 2019-12-02 NOTE — Telephone Encounter (Signed)
Left message for patient to call back  

## 2019-12-02 NOTE — Patient Outreach (Signed)
[place] Coordinator follow up.  Member screened for potential [facility] Care Management needs as a benefit of [place] Medicare.  Michael Ware remains in [facility]. SNF SW previously indicated member is slated for discharge on tomorrow 12/03/19 with daughters support. Lived alone prior. Attempted to reach Michael Ware at [phone]. No answer. HIPAA compliant voicemail message left to request return call.   Telephone call made to Michael Ware (daughter/HCPOA) [phone]. Patient identifiers confirmed. Michael Ware confirms member will transition home tomorrow from [employer].Michael Ware plans to stay with Michael Ware as long as she needs to. States if member is unable to stay at home alone, she will plan for Michael Ware to move with her to [place].   Explained [place] Management services. Michael Ware is agreeable. Explained [facility] Care Management will not interfere or replace services provided by home health. Michael Ware is not sure which home health agency will be arranged. States she will find out when she picks Michael Ware up tomorrow.  Michael Ware states she should be contacted for post SNF calls. States Michael Ware will not likely answer the phone. However, confirms best contact numbers are member's home number [phone] and [facility] mobile [phone].  Will email [place] Management contact information and the like. Will make referral for [place] for complex case management.  Michael Ware has a history of falls, humerus fracture, bradycardia COPD, HLD, PE, DVT, depression.  Communication sent to facility SW to inquire about home health agency.    [name], MSN-Ed, RN,BSN [place] Acute Care Coordinator [phone] [facility]) [phone]  (Toll free office)

## 2019-12-02 NOTE — Telephone Encounter (Signed)
Dr. Rayann Heman made aware will forward to ordering provider for advisement.

## 2019-12-03 DIAGNOSIS — I119 Hypertensive heart disease without heart failure: Secondary | ICD-10-CM | POA: Diagnosis not present

## 2019-12-03 DIAGNOSIS — S50312D Abrasion of left elbow, subsequent encounter: Secondary | ICD-10-CM | POA: Diagnosis not present

## 2019-12-03 DIAGNOSIS — S42309A Unspecified fracture of shaft of humerus, unspecified arm, initial encounter for closed fracture: Secondary | ICD-10-CM | POA: Diagnosis not present

## 2019-12-03 DIAGNOSIS — I82452 Acute embolism and thrombosis of left peroneal vein: Secondary | ICD-10-CM | POA: Diagnosis not present

## 2019-12-03 DIAGNOSIS — S42351D Displaced comminuted fracture of shaft of humerus, right arm, subsequent encounter for fracture with routine healing: Secondary | ICD-10-CM | POA: Diagnosis not present

## 2019-12-03 DIAGNOSIS — I2699 Other pulmonary embolism without acute cor pulmonale: Secondary | ICD-10-CM | POA: Diagnosis not present

## 2019-12-03 DIAGNOSIS — J441 Chronic obstructive pulmonary disease with (acute) exacerbation: Secondary | ICD-10-CM | POA: Diagnosis not present

## 2019-12-05 ENCOUNTER — Ambulatory Visit: Payer: Medicare Other | Admitting: Student

## 2019-12-05 DIAGNOSIS — S50312D Abrasion of left elbow, subsequent encounter: Secondary | ICD-10-CM | POA: Diagnosis not present

## 2019-12-05 DIAGNOSIS — I82452 Acute embolism and thrombosis of left peroneal vein: Secondary | ICD-10-CM | POA: Diagnosis not present

## 2019-12-05 DIAGNOSIS — I119 Hypertensive heart disease without heart failure: Secondary | ICD-10-CM | POA: Diagnosis not present

## 2019-12-05 DIAGNOSIS — I2699 Other pulmonary embolism without acute cor pulmonale: Secondary | ICD-10-CM | POA: Diagnosis not present

## 2019-12-05 DIAGNOSIS — S42351D Displaced comminuted fracture of shaft of humerus, right arm, subsequent encounter for fracture with routine healing: Secondary | ICD-10-CM | POA: Diagnosis not present

## 2019-12-05 DIAGNOSIS — J441 Chronic obstructive pulmonary disease with (acute) exacerbation: Secondary | ICD-10-CM | POA: Diagnosis not present

## 2019-12-08 ENCOUNTER — Ambulatory Visit: Payer: Medicare Other | Admitting: Internal Medicine

## 2019-12-08 ENCOUNTER — Other Ambulatory Visit: Payer: Self-pay | Admitting: *Deleted

## 2019-12-08 DIAGNOSIS — I119 Hypertensive heart disease without heart failure: Secondary | ICD-10-CM | POA: Diagnosis not present

## 2019-12-08 DIAGNOSIS — S50312D Abrasion of left elbow, subsequent encounter: Secondary | ICD-10-CM | POA: Diagnosis not present

## 2019-12-08 DIAGNOSIS — J441 Chronic obstructive pulmonary disease with (acute) exacerbation: Secondary | ICD-10-CM | POA: Diagnosis not present

## 2019-12-08 DIAGNOSIS — I82452 Acute embolism and thrombosis of left peroneal vein: Secondary | ICD-10-CM | POA: Diagnosis not present

## 2019-12-08 DIAGNOSIS — I2699 Other pulmonary embolism without acute cor pulmonale: Secondary | ICD-10-CM | POA: Diagnosis not present

## 2019-12-08 DIAGNOSIS — S42351D Displaced comminuted fracture of shaft of humerus, right arm, subsequent encounter for fracture with routine healing: Secondary | ICD-10-CM | POA: Diagnosis not present

## 2019-12-08 NOTE — Telephone Encounter (Signed)
Spoke with the patient and his daughter. The patient states that he does not want to see a cardiologist at this time and he is feeling good. He states that his heart rate has been normal. He denies any symptoms such as chest pain, SOB, palpitations, or dizziness. I once again encouraged the patient to follow up with cardiology and offered to reschedule his appointment. Patient refused.  Daughter states that if he changes his mind they will give [place] a call.

## 2019-12-08 NOTE — Patient Outreach (Signed)
[facility]) Care Management  12/08/2019  KARAN RAMNAUTH January 23, 1932 [id number]   EMMI-GENERAL DISCHARGE-RESOLVED RED ON EMMI ALERT Day #1 Date: 12/05/2019 Red Alert Reason: no wound healing.  OUTREACH #1: RN spoke with pt's daughter Claiborne Billings who confirmed the above emmi has been resolved. Soham has visited and the site is "much better and scabbed over". No additional  issues related to wound care. Emmi resolved and daughter aware she will received a 2nd call related to pt's ongoing recovery.   Will close this emmi as resolved.  ______________________________________  Transition of care-Declined [facility] services  RN attempted to further engage related to [facility] services and explained available program and disciplines for case management services. Daughter Claiborne Billings) reports pt has all the help he needs at this time with [facility] [place] for PT/OT/aide services and nursing for his ongoing wound care. States the nurse will be making two additional home visit to verify ongoing healing. Reports pt's has an office with his primary provider on this Wed. States there are two duaghter taking turns caring for this pt however will see how pt's recovery goes and determine if they will need to take to pt to [place] to stay with one of the daughter's Claiborne Billings). Currently reports the pt is very debilitated and unable to ambulate safety.   RN offered case management services for weekly call or monthly based upon her availability and schedule. Explained other disciplines with [facility] if additional services are needed after [facility] at no charge based upon ongoing coverage with the contracted insurance carrier. Pt with verbal understanding but feels she does not need additional services at this time. Caregiver daughter appreciative but opt to decline [facility] at this time. Confirms she has contact number and will call if available resources or needed but with the [place] involved and her sister is  coming to assist with the ongoing care of this pt she does not need additional services.  Will notify pt' provider and close case as requested at this time with no additional needs.  Raina Mina, RN Care Management Coordinator [place] Office [phone]

## 2019-12-08 NOTE — Patient Outreach (Signed)
[facility]) Care Management  12/08/2019  Michael Ware [birth year] [id number]   Initial Referral Received 10/27 Initial Outreach 12/08/2019  Transition of Care  RN attempted outreach call to the pt's daughter [name] [phone] however unsuccessful. RN able to leave a HIPAA approved voice message requesting a call back.  Will send outreach letter and rescheduled another outreach.  Raina Mina, RN Care Management Coordinator [place] Office [phone]

## 2019-12-09 DIAGNOSIS — I82452 Acute embolism and thrombosis of left peroneal vein: Secondary | ICD-10-CM | POA: Diagnosis not present

## 2019-12-09 DIAGNOSIS — S50312D Abrasion of left elbow, subsequent encounter: Secondary | ICD-10-CM | POA: Diagnosis not present

## 2019-12-09 DIAGNOSIS — S42351D Displaced comminuted fracture of shaft of humerus, right arm, subsequent encounter for fracture with routine healing: Secondary | ICD-10-CM | POA: Diagnosis not present

## 2019-12-09 DIAGNOSIS — J441 Chronic obstructive pulmonary disease with (acute) exacerbation: Secondary | ICD-10-CM | POA: Diagnosis not present

## 2019-12-09 DIAGNOSIS — I119 Hypertensive heart disease without heart failure: Secondary | ICD-10-CM | POA: Diagnosis not present

## 2019-12-09 DIAGNOSIS — I2699 Other pulmonary embolism without acute cor pulmonale: Secondary | ICD-10-CM | POA: Diagnosis not present

## 2019-12-10 ENCOUNTER — Ambulatory Visit: Payer: Self-pay | Admitting: *Deleted

## 2019-12-10 DIAGNOSIS — M545 Low back pain, unspecified: Secondary | ICD-10-CM | POA: Diagnosis not present

## 2019-12-10 DIAGNOSIS — G4700 Insomnia, unspecified: Secondary | ICD-10-CM | POA: Diagnosis not present

## 2019-12-10 DIAGNOSIS — R609 Edema, unspecified: Secondary | ICD-10-CM | POA: Diagnosis not present

## 2019-12-10 DIAGNOSIS — R5381 Other malaise: Secondary | ICD-10-CM | POA: Diagnosis not present

## 2019-12-10 DIAGNOSIS — I2699 Other pulmonary embolism without acute cor pulmonale: Secondary | ICD-10-CM | POA: Diagnosis not present

## 2019-12-10 DIAGNOSIS — M79601 Pain in right arm: Secondary | ICD-10-CM | POA: Diagnosis not present

## 2019-12-10 DIAGNOSIS — E860 Dehydration: Secondary | ICD-10-CM | POA: Diagnosis not present

## 2019-12-11 DIAGNOSIS — S42351D Displaced comminuted fracture of shaft of humerus, right arm, subsequent encounter for fracture with routine healing: Secondary | ICD-10-CM | POA: Diagnosis not present

## 2019-12-11 DIAGNOSIS — J441 Chronic obstructive pulmonary disease with (acute) exacerbation: Secondary | ICD-10-CM | POA: Diagnosis not present

## 2019-12-11 DIAGNOSIS — I119 Hypertensive heart disease without heart failure: Secondary | ICD-10-CM | POA: Diagnosis not present

## 2019-12-11 DIAGNOSIS — I2699 Other pulmonary embolism without acute cor pulmonale: Secondary | ICD-10-CM | POA: Diagnosis not present

## 2019-12-11 DIAGNOSIS — I82452 Acute embolism and thrombosis of left peroneal vein: Secondary | ICD-10-CM | POA: Diagnosis not present

## 2019-12-11 DIAGNOSIS — S50312D Abrasion of left elbow, subsequent encounter: Secondary | ICD-10-CM | POA: Diagnosis not present

## 2019-12-12 DIAGNOSIS — J441 Chronic obstructive pulmonary disease with (acute) exacerbation: Secondary | ICD-10-CM | POA: Diagnosis not present

## 2019-12-12 DIAGNOSIS — I119 Hypertensive heart disease without heart failure: Secondary | ICD-10-CM | POA: Diagnosis not present

## 2019-12-12 DIAGNOSIS — S42351D Displaced comminuted fracture of shaft of humerus, right arm, subsequent encounter for fracture with routine healing: Secondary | ICD-10-CM | POA: Diagnosis not present

## 2019-12-12 DIAGNOSIS — I82452 Acute embolism and thrombosis of left peroneal vein: Secondary | ICD-10-CM | POA: Diagnosis not present

## 2019-12-12 DIAGNOSIS — S50312D Abrasion of left elbow, subsequent encounter: Secondary | ICD-10-CM | POA: Diagnosis not present

## 2019-12-12 DIAGNOSIS — I2699 Other pulmonary embolism without acute cor pulmonale: Secondary | ICD-10-CM | POA: Diagnosis not present

## 2019-12-15 DIAGNOSIS — J441 Chronic obstructive pulmonary disease with (acute) exacerbation: Secondary | ICD-10-CM | POA: Diagnosis not present

## 2019-12-15 DIAGNOSIS — I2699 Other pulmonary embolism without acute cor pulmonale: Secondary | ICD-10-CM | POA: Diagnosis not present

## 2019-12-15 DIAGNOSIS — I82452 Acute embolism and thrombosis of left peroneal vein: Secondary | ICD-10-CM | POA: Diagnosis not present

## 2019-12-15 DIAGNOSIS — S50312D Abrasion of left elbow, subsequent encounter: Secondary | ICD-10-CM | POA: Diagnosis not present

## 2019-12-15 DIAGNOSIS — S42351D Displaced comminuted fracture of shaft of humerus, right arm, subsequent encounter for fracture with routine healing: Secondary | ICD-10-CM | POA: Diagnosis not present

## 2019-12-15 DIAGNOSIS — I119 Hypertensive heart disease without heart failure: Secondary | ICD-10-CM | POA: Diagnosis not present

## 2019-12-16 DIAGNOSIS — I82452 Acute embolism and thrombosis of left peroneal vein: Secondary | ICD-10-CM | POA: Diagnosis not present

## 2019-12-16 DIAGNOSIS — I119 Hypertensive heart disease without heart failure: Secondary | ICD-10-CM | POA: Diagnosis not present

## 2019-12-16 DIAGNOSIS — I2699 Other pulmonary embolism without acute cor pulmonale: Secondary | ICD-10-CM | POA: Diagnosis not present

## 2019-12-16 DIAGNOSIS — S42351D Displaced comminuted fracture of shaft of humerus, right arm, subsequent encounter for fracture with routine healing: Secondary | ICD-10-CM | POA: Diagnosis not present

## 2019-12-16 DIAGNOSIS — J441 Chronic obstructive pulmonary disease with (acute) exacerbation: Secondary | ICD-10-CM | POA: Diagnosis not present

## 2019-12-16 DIAGNOSIS — S50312D Abrasion of left elbow, subsequent encounter: Secondary | ICD-10-CM | POA: Diagnosis not present

## 2019-12-17 DIAGNOSIS — I119 Hypertensive heart disease without heart failure: Secondary | ICD-10-CM | POA: Diagnosis not present

## 2019-12-17 DIAGNOSIS — J441 Chronic obstructive pulmonary disease with (acute) exacerbation: Secondary | ICD-10-CM | POA: Diagnosis not present

## 2019-12-17 DIAGNOSIS — S42351D Displaced comminuted fracture of shaft of humerus, right arm, subsequent encounter for fracture with routine healing: Secondary | ICD-10-CM | POA: Diagnosis not present

## 2019-12-17 DIAGNOSIS — R609 Edema, unspecified: Secondary | ICD-10-CM | POA: Diagnosis not present

## 2019-12-17 DIAGNOSIS — I2699 Other pulmonary embolism without acute cor pulmonale: Secondary | ICD-10-CM | POA: Diagnosis not present

## 2019-12-17 DIAGNOSIS — S50312D Abrasion of left elbow, subsequent encounter: Secondary | ICD-10-CM | POA: Diagnosis not present

## 2019-12-17 DIAGNOSIS — I82452 Acute embolism and thrombosis of left peroneal vein: Secondary | ICD-10-CM | POA: Diagnosis not present

## 2019-12-18 DIAGNOSIS — S50312D Abrasion of left elbow, subsequent encounter: Secondary | ICD-10-CM | POA: Diagnosis not present

## 2019-12-18 DIAGNOSIS — S42351D Displaced comminuted fracture of shaft of humerus, right arm, subsequent encounter for fracture with routine healing: Secondary | ICD-10-CM | POA: Diagnosis not present

## 2019-12-18 DIAGNOSIS — J441 Chronic obstructive pulmonary disease with (acute) exacerbation: Secondary | ICD-10-CM | POA: Diagnosis not present

## 2019-12-18 DIAGNOSIS — I119 Hypertensive heart disease without heart failure: Secondary | ICD-10-CM | POA: Diagnosis not present

## 2019-12-18 DIAGNOSIS — I2699 Other pulmonary embolism without acute cor pulmonale: Secondary | ICD-10-CM | POA: Diagnosis not present

## 2019-12-18 DIAGNOSIS — I82452 Acute embolism and thrombosis of left peroneal vein: Secondary | ICD-10-CM | POA: Diagnosis not present

## 2019-12-22 DIAGNOSIS — S50312D Abrasion of left elbow, subsequent encounter: Secondary | ICD-10-CM | POA: Diagnosis not present

## 2019-12-22 DIAGNOSIS — I82452 Acute embolism and thrombosis of left peroneal vein: Secondary | ICD-10-CM | POA: Diagnosis not present

## 2019-12-22 DIAGNOSIS — J441 Chronic obstructive pulmonary disease with (acute) exacerbation: Secondary | ICD-10-CM | POA: Diagnosis not present

## 2019-12-22 DIAGNOSIS — I2699 Other pulmonary embolism without acute cor pulmonale: Secondary | ICD-10-CM | POA: Diagnosis not present

## 2019-12-22 DIAGNOSIS — S42351D Displaced comminuted fracture of shaft of humerus, right arm, subsequent encounter for fracture with routine healing: Secondary | ICD-10-CM | POA: Diagnosis not present

## 2019-12-22 DIAGNOSIS — I119 Hypertensive heart disease without heart failure: Secondary | ICD-10-CM | POA: Diagnosis not present

## 2019-12-24 DIAGNOSIS — I82452 Acute embolism and thrombosis of left peroneal vein: Secondary | ICD-10-CM | POA: Diagnosis not present

## 2019-12-24 DIAGNOSIS — I119 Hypertensive heart disease without heart failure: Secondary | ICD-10-CM | POA: Diagnosis not present

## 2019-12-24 DIAGNOSIS — J441 Chronic obstructive pulmonary disease with (acute) exacerbation: Secondary | ICD-10-CM | POA: Diagnosis not present

## 2019-12-24 DIAGNOSIS — S50312D Abrasion of left elbow, subsequent encounter: Secondary | ICD-10-CM | POA: Diagnosis not present

## 2019-12-24 DIAGNOSIS — S42351D Displaced comminuted fracture of shaft of humerus, right arm, subsequent encounter for fracture with routine healing: Secondary | ICD-10-CM | POA: Diagnosis not present

## 2019-12-24 DIAGNOSIS — I2699 Other pulmonary embolism without acute cor pulmonale: Secondary | ICD-10-CM | POA: Diagnosis not present

## 2019-12-26 DIAGNOSIS — J441 Chronic obstructive pulmonary disease with (acute) exacerbation: Secondary | ICD-10-CM | POA: Diagnosis not present

## 2019-12-26 DIAGNOSIS — I2699 Other pulmonary embolism without acute cor pulmonale: Secondary | ICD-10-CM | POA: Diagnosis not present

## 2019-12-26 DIAGNOSIS — S50312D Abrasion of left elbow, subsequent encounter: Secondary | ICD-10-CM | POA: Diagnosis not present

## 2019-12-26 DIAGNOSIS — I119 Hypertensive heart disease without heart failure: Secondary | ICD-10-CM | POA: Diagnosis not present

## 2019-12-26 DIAGNOSIS — I82452 Acute embolism and thrombosis of left peroneal vein: Secondary | ICD-10-CM | POA: Diagnosis not present

## 2019-12-26 DIAGNOSIS — S42351D Displaced comminuted fracture of shaft of humerus, right arm, subsequent encounter for fracture with routine healing: Secondary | ICD-10-CM | POA: Diagnosis not present

## 2019-12-29 DIAGNOSIS — S50312D Abrasion of left elbow, subsequent encounter: Secondary | ICD-10-CM | POA: Diagnosis not present

## 2019-12-29 DIAGNOSIS — I2699 Other pulmonary embolism without acute cor pulmonale: Secondary | ICD-10-CM | POA: Diagnosis not present

## 2019-12-29 DIAGNOSIS — S42351D Displaced comminuted fracture of shaft of humerus, right arm, subsequent encounter for fracture with routine healing: Secondary | ICD-10-CM | POA: Diagnosis not present

## 2019-12-29 DIAGNOSIS — J441 Chronic obstructive pulmonary disease with (acute) exacerbation: Secondary | ICD-10-CM | POA: Diagnosis not present

## 2019-12-29 DIAGNOSIS — I82452 Acute embolism and thrombosis of left peroneal vein: Secondary | ICD-10-CM | POA: Diagnosis not present

## 2019-12-29 DIAGNOSIS — I119 Hypertensive heart disease without heart failure: Secondary | ICD-10-CM | POA: Diagnosis not present

## 2019-12-30 DIAGNOSIS — S50312D Abrasion of left elbow, subsequent encounter: Secondary | ICD-10-CM | POA: Diagnosis not present

## 2019-12-30 DIAGNOSIS — I82452 Acute embolism and thrombosis of left peroneal vein: Secondary | ICD-10-CM | POA: Diagnosis not present

## 2019-12-30 DIAGNOSIS — I119 Hypertensive heart disease without heart failure: Secondary | ICD-10-CM | POA: Diagnosis not present

## 2019-12-30 DIAGNOSIS — S42351D Displaced comminuted fracture of shaft of humerus, right arm, subsequent encounter for fracture with routine healing: Secondary | ICD-10-CM | POA: Diagnosis not present

## 2019-12-30 DIAGNOSIS — I2699 Other pulmonary embolism without acute cor pulmonale: Secondary | ICD-10-CM | POA: Diagnosis not present

## 2019-12-30 DIAGNOSIS — J441 Chronic obstructive pulmonary disease with (acute) exacerbation: Secondary | ICD-10-CM | POA: Diagnosis not present

## 2019-12-31 DIAGNOSIS — I119 Hypertensive heart disease without heart failure: Secondary | ICD-10-CM | POA: Diagnosis not present

## 2019-12-31 DIAGNOSIS — S42351D Displaced comminuted fracture of shaft of humerus, right arm, subsequent encounter for fracture with routine healing: Secondary | ICD-10-CM | POA: Diagnosis not present

## 2019-12-31 DIAGNOSIS — I2699 Other pulmonary embolism without acute cor pulmonale: Secondary | ICD-10-CM | POA: Diagnosis not present

## 2019-12-31 DIAGNOSIS — I82452 Acute embolism and thrombosis of left peroneal vein: Secondary | ICD-10-CM | POA: Diagnosis not present

## 2019-12-31 DIAGNOSIS — S50312D Abrasion of left elbow, subsequent encounter: Secondary | ICD-10-CM | POA: Diagnosis not present

## 2019-12-31 DIAGNOSIS — J441 Chronic obstructive pulmonary disease with (acute) exacerbation: Secondary | ICD-10-CM | POA: Diagnosis not present

## 2020-01-02 DIAGNOSIS — M7072 Other bursitis of hip, left hip: Secondary | ICD-10-CM | POA: Diagnosis not present

## 2020-01-02 DIAGNOSIS — F988 Other specified behavioral and emotional disorders with onset usually occurring in childhood and adolescence: Secondary | ICD-10-CM | POA: Diagnosis not present

## 2020-01-02 DIAGNOSIS — I119 Hypertensive heart disease without heart failure: Secondary | ICD-10-CM | POA: Diagnosis not present

## 2020-01-02 DIAGNOSIS — W19XXXD Unspecified fall, subsequent encounter: Secondary | ICD-10-CM | POA: Diagnosis not present

## 2020-01-02 DIAGNOSIS — R0781 Pleurodynia: Secondary | ICD-10-CM | POA: Diagnosis not present

## 2020-01-02 DIAGNOSIS — S42351D Displaced comminuted fracture of shaft of humerus, right arm, subsequent encounter for fracture with routine healing: Secondary | ICD-10-CM | POA: Diagnosis not present

## 2020-01-02 DIAGNOSIS — R001 Bradycardia, unspecified: Secondary | ICD-10-CM | POA: Diagnosis not present

## 2020-01-02 DIAGNOSIS — E559 Vitamin D deficiency, unspecified: Secondary | ICD-10-CM | POA: Diagnosis not present

## 2020-01-02 DIAGNOSIS — I2699 Other pulmonary embolism without acute cor pulmonale: Secondary | ICD-10-CM | POA: Diagnosis not present

## 2020-01-02 DIAGNOSIS — E782 Mixed hyperlipidemia: Secondary | ICD-10-CM | POA: Diagnosis not present

## 2020-01-02 DIAGNOSIS — J441 Chronic obstructive pulmonary disease with (acute) exacerbation: Secondary | ICD-10-CM | POA: Diagnosis not present

## 2020-01-02 DIAGNOSIS — N138 Other obstructive and reflux uropathy: Secondary | ICD-10-CM | POA: Diagnosis not present

## 2020-01-02 DIAGNOSIS — S50312D Abrasion of left elbow, subsequent encounter: Secondary | ICD-10-CM | POA: Diagnosis not present

## 2020-01-02 DIAGNOSIS — M48061 Spinal stenosis, lumbar region without neurogenic claudication: Secondary | ICD-10-CM | POA: Diagnosis not present

## 2020-01-02 DIAGNOSIS — N401 Enlarged prostate with lower urinary tract symptoms: Secondary | ICD-10-CM | POA: Diagnosis not present

## 2020-01-02 DIAGNOSIS — R55 Syncope and collapse: Secondary | ICD-10-CM | POA: Diagnosis not present

## 2020-01-02 DIAGNOSIS — Z9181 History of falling: Secondary | ICD-10-CM | POA: Diagnosis not present

## 2020-01-02 DIAGNOSIS — D7589 Other specified diseases of blood and blood-forming organs: Secondary | ICD-10-CM | POA: Diagnosis not present

## 2020-01-02 DIAGNOSIS — I82452 Acute embolism and thrombosis of left peroneal vein: Secondary | ICD-10-CM | POA: Diagnosis not present

## 2020-01-02 DIAGNOSIS — F329 Major depressive disorder, single episode, unspecified: Secondary | ICD-10-CM | POA: Diagnosis not present

## 2020-01-02 DIAGNOSIS — Z7902 Long term (current) use of antithrombotics/antiplatelets: Secondary | ICD-10-CM | POA: Diagnosis not present

## 2020-01-02 DIAGNOSIS — K5790 Diverticulosis of intestine, part unspecified, without perforation or abscess without bleeding: Secondary | ICD-10-CM | POA: Diagnosis not present

## 2020-01-05 DIAGNOSIS — I2699 Other pulmonary embolism without acute cor pulmonale: Secondary | ICD-10-CM | POA: Diagnosis not present

## 2020-01-05 DIAGNOSIS — I82452 Acute embolism and thrombosis of left peroneal vein: Secondary | ICD-10-CM | POA: Diagnosis not present

## 2020-01-05 DIAGNOSIS — S42351D Displaced comminuted fracture of shaft of humerus, right arm, subsequent encounter for fracture with routine healing: Secondary | ICD-10-CM | POA: Diagnosis not present

## 2020-01-05 DIAGNOSIS — Z4789 Encounter for other orthopedic aftercare: Secondary | ICD-10-CM | POA: Diagnosis not present

## 2020-01-05 DIAGNOSIS — S50312D Abrasion of left elbow, subsequent encounter: Secondary | ICD-10-CM | POA: Diagnosis not present

## 2020-01-05 DIAGNOSIS — I119 Hypertensive heart disease without heart failure: Secondary | ICD-10-CM | POA: Diagnosis not present

## 2020-01-05 DIAGNOSIS — J441 Chronic obstructive pulmonary disease with (acute) exacerbation: Secondary | ICD-10-CM | POA: Diagnosis not present

## 2020-01-06 DIAGNOSIS — I2699 Other pulmonary embolism without acute cor pulmonale: Secondary | ICD-10-CM | POA: Diagnosis not present

## 2020-01-06 DIAGNOSIS — S42351D Displaced comminuted fracture of shaft of humerus, right arm, subsequent encounter for fracture with routine healing: Secondary | ICD-10-CM | POA: Diagnosis not present

## 2020-01-06 DIAGNOSIS — I119 Hypertensive heart disease without heart failure: Secondary | ICD-10-CM | POA: Diagnosis not present

## 2020-01-06 DIAGNOSIS — J441 Chronic obstructive pulmonary disease with (acute) exacerbation: Secondary | ICD-10-CM | POA: Diagnosis not present

## 2020-01-06 DIAGNOSIS — S50312D Abrasion of left elbow, subsequent encounter: Secondary | ICD-10-CM | POA: Diagnosis not present

## 2020-01-06 DIAGNOSIS — I82452 Acute embolism and thrombosis of left peroneal vein: Secondary | ICD-10-CM | POA: Diagnosis not present

## 2020-01-07 ENCOUNTER — Encounter (HOSPITAL_COMMUNITY): Payer: Self-pay

## 2020-01-07 ENCOUNTER — Emergency Department (HOSPITAL_COMMUNITY): Payer: Medicare Other

## 2020-01-07 ENCOUNTER — Inpatient Hospital Stay (HOSPITAL_COMMUNITY)
Admission: EM | Admit: 2020-01-07 | Discharge: 2020-01-12 | DRG: 177 | Disposition: A | Discharge status: Home Health | Payer: Medicare Other | Attending: Internal Medicine | Admitting: Internal Medicine

## 2020-01-07 DIAGNOSIS — K5790 Diverticulosis of intestine, part unspecified, without perforation or abscess without bleeding: Secondary | ICD-10-CM | POA: Diagnosis present

## 2020-01-07 DIAGNOSIS — Z66 Do not resuscitate: Secondary | ICD-10-CM | POA: Diagnosis present

## 2020-01-07 DIAGNOSIS — I119 Hypertensive heart disease without heart failure: Secondary | ICD-10-CM | POA: Diagnosis not present

## 2020-01-07 DIAGNOSIS — M47814 Spondylosis without myelopathy or radiculopathy, thoracic region: Secondary | ICD-10-CM | POA: Diagnosis not present

## 2020-01-07 DIAGNOSIS — G9341 Metabolic encephalopathy: Secondary | ICD-10-CM | POA: Diagnosis present

## 2020-01-07 DIAGNOSIS — F05 Delirium due to known physiological condition: Secondary | ICD-10-CM | POA: Diagnosis not present

## 2020-01-07 DIAGNOSIS — R1310 Dysphagia, unspecified: Secondary | ICD-10-CM | POA: Diagnosis present

## 2020-01-07 DIAGNOSIS — J690 Pneumonitis due to inhalation of food and vomit: Secondary | ICD-10-CM | POA: Diagnosis not present

## 2020-01-07 DIAGNOSIS — R410 Disorientation, unspecified: Secondary | ICD-10-CM | POA: Diagnosis not present

## 2020-01-07 DIAGNOSIS — I82452 Acute embolism and thrombosis of left peroneal vein: Secondary | ICD-10-CM | POA: Diagnosis not present

## 2020-01-07 DIAGNOSIS — R531 Weakness: Secondary | ICD-10-CM

## 2020-01-07 DIAGNOSIS — Z86718 Personal history of other venous thrombosis and embolism: Secondary | ICD-10-CM

## 2020-01-07 DIAGNOSIS — I10 Essential (primary) hypertension: Secondary | ICD-10-CM | POA: Diagnosis present

## 2020-01-07 DIAGNOSIS — J9601 Acute respiratory failure with hypoxia: Secondary | ICD-10-CM | POA: Diagnosis not present

## 2020-01-07 DIAGNOSIS — M6258 Muscle wasting and atrophy, not elsewhere classified, other site: Secondary | ICD-10-CM | POA: Diagnosis not present

## 2020-01-07 DIAGNOSIS — R29810 Facial weakness: Secondary | ICD-10-CM | POA: Diagnosis present

## 2020-01-07 DIAGNOSIS — Z7901 Long term (current) use of anticoagulants: Secondary | ICD-10-CM

## 2020-01-07 DIAGNOSIS — M5116 Intervertebral disc disorders with radiculopathy, lumbar region: Secondary | ICD-10-CM | POA: Diagnosis not present

## 2020-01-07 DIAGNOSIS — R4781 Slurred speech: Secondary | ICD-10-CM | POA: Diagnosis present

## 2020-01-07 DIAGNOSIS — N401 Enlarged prostate with lower urinary tract symptoms: Secondary | ICD-10-CM | POA: Diagnosis present

## 2020-01-07 DIAGNOSIS — M4186 Other forms of scoliosis, lumbar region: Secondary | ICD-10-CM | POA: Diagnosis not present

## 2020-01-07 DIAGNOSIS — Z888 Allergy status to other drugs, medicaments and biological substances status: Secondary | ICD-10-CM

## 2020-01-07 DIAGNOSIS — R001 Bradycardia, unspecified: Secondary | ICD-10-CM | POA: Diagnosis not present

## 2020-01-07 DIAGNOSIS — F419 Anxiety disorder, unspecified: Secondary | ICD-10-CM | POA: Diagnosis present

## 2020-01-07 DIAGNOSIS — I6782 Cerebral ischemia: Secondary | ICD-10-CM | POA: Diagnosis not present

## 2020-01-07 DIAGNOSIS — S50312D Abrasion of left elbow, subsequent encounter: Secondary | ICD-10-CM | POA: Diagnosis not present

## 2020-01-07 DIAGNOSIS — Z87891 Personal history of nicotine dependence: Secondary | ICD-10-CM

## 2020-01-07 DIAGNOSIS — E559 Vitamin D deficiency, unspecified: Secondary | ICD-10-CM | POA: Diagnosis present

## 2020-01-07 DIAGNOSIS — J441 Chronic obstructive pulmonary disease with (acute) exacerbation: Secondary | ICD-10-CM | POA: Diagnosis not present

## 2020-01-07 DIAGNOSIS — I2699 Other pulmonary embolism without acute cor pulmonale: Secondary | ICD-10-CM | POA: Diagnosis not present

## 2020-01-07 DIAGNOSIS — Z885 Allergy status to narcotic agent status: Secondary | ICD-10-CM

## 2020-01-07 DIAGNOSIS — Z20822 Contact with and (suspected) exposure to covid-19: Secondary | ICD-10-CM | POA: Diagnosis present

## 2020-01-07 DIAGNOSIS — R270 Ataxia, unspecified: Secondary | ICD-10-CM | POA: Diagnosis not present

## 2020-01-07 DIAGNOSIS — Z8669 Personal history of other diseases of the nervous system and sense organs: Secondary | ICD-10-CM | POA: Diagnosis not present

## 2020-01-07 DIAGNOSIS — Z79899 Other long term (current) drug therapy: Secondary | ICD-10-CM

## 2020-01-07 DIAGNOSIS — G9389 Other specified disorders of brain: Secondary | ICD-10-CM | POA: Diagnosis not present

## 2020-01-07 DIAGNOSIS — G319 Degenerative disease of nervous system, unspecified: Secondary | ICD-10-CM | POA: Diagnosis not present

## 2020-01-07 DIAGNOSIS — R29898 Other symptoms and signs involving the musculoskeletal system: Secondary | ICD-10-CM | POA: Diagnosis present

## 2020-01-07 DIAGNOSIS — R059 Cough, unspecified: Secondary | ICD-10-CM

## 2020-01-07 DIAGNOSIS — G459 Transient cerebral ischemic attack, unspecified: Secondary | ICD-10-CM | POA: Diagnosis present

## 2020-01-07 DIAGNOSIS — J440 Chronic obstructive pulmonary disease with acute lower respiratory infection: Secondary | ICD-10-CM | POA: Diagnosis present

## 2020-01-07 DIAGNOSIS — L89159 Pressure ulcer of sacral region, unspecified stage: Secondary | ICD-10-CM | POA: Diagnosis present

## 2020-01-07 DIAGNOSIS — R0902 Hypoxemia: Secondary | ICD-10-CM | POA: Diagnosis not present

## 2020-01-07 DIAGNOSIS — E782 Mixed hyperlipidemia: Secondary | ICD-10-CM | POA: Diagnosis present

## 2020-01-07 DIAGNOSIS — E785 Hyperlipidemia, unspecified: Secondary | ICD-10-CM | POA: Diagnosis present

## 2020-01-07 DIAGNOSIS — S42351D Displaced comminuted fracture of shaft of humerus, right arm, subsequent encounter for fracture with routine healing: Secondary | ICD-10-CM | POA: Diagnosis not present

## 2020-01-07 DIAGNOSIS — Z8249 Family history of ischemic heart disease and other diseases of the circulatory system: Secondary | ICD-10-CM

## 2020-01-07 DIAGNOSIS — Z86711 Personal history of pulmonary embolism: Secondary | ICD-10-CM

## 2020-01-07 LAB — COMPREHENSIVE METABOLIC PANEL
ALT: 27 U/L (ref 0–44)
AST: 34 U/L (ref 15–41)
Albumin: 3.6 g/dL (ref 3.5–5.0)
Alkaline Phosphatase: 94 U/L (ref 38–126)
Anion gap: 10 (ref 5–15)
BUN: 12 mg/dL (ref 8–23)
CO2: 28 mmol/L (ref 22–32)
Calcium: 9 mg/dL (ref 8.9–10.3)
Chloride: 101 mmol/L (ref 98–111)
Creatinine, Ser: 0.97 mg/dL (ref 0.61–1.24)
GFR, Estimated: >60 mL/min (ref >60)
Glucose, Bld: 100 mg/dL — ABNORMAL HIGH (ref 70–99)
Potassium: 3.8 mmol/L (ref 3.5–5.1)
Sodium: 139 mmol/L (ref 135–145)
Total Bilirubin: 0.7 mg/dL (ref 0.3–1.2)
Total Protein: 6.5 g/dL (ref 6.5–8.1)

## 2020-01-07 LAB — CBC WITH DIFFERENTIAL/PLATELET
Abs Immature Granulocytes: 0.02 10*3/uL (ref 0.00–0.07)
Basophils Absolute: 0 10*3/uL (ref 0.0–0.1)
Basophils Relative: 0 %
Eosinophils Absolute: 0.1 10*3/uL (ref 0.0–0.5)
Eosinophils Relative: 2 %
HCT: 42.8 % (ref 39.0–52.0)
Hemoglobin: 14.4 g/dL (ref 13.0–17.0)
Immature Granulocytes: 0 %
Lymphocytes Relative: 23 %
Lymphs Abs: 1.6 10*3/uL (ref 0.7–4.0)
MCH: 35.4 pg — ABNORMAL HIGH (ref 26.0–34.0)
MCHC: 33.6 g/dL (ref 30.0–36.0)
MCV: 105.2 fL — ABNORMAL HIGH (ref 80.0–100.0)
Monocytes Absolute: 1 10*3/uL (ref 0.1–1.0)
Monocytes Relative: 14 %
Neutro Abs: 4.1 10*3/uL (ref 1.7–7.7)
Neutrophils Relative %: 61 %
Platelets: 245 10*3/uL (ref 150–400)
RBC: 4.07 MIL/uL — ABNORMAL LOW (ref 4.22–5.81)
RDW: 12.6 % (ref 11.5–15.5)
WBC: 6.8 10*3/uL (ref 4.0–10.5)
nRBC: 0 % (ref 0.0–0.2)

## 2020-01-07 LAB — TROPONIN I (HIGH SENSITIVITY): Troponin I (High Sensitivity): 3 ng/L (ref <18)

## 2020-01-07 LAB — LACTIC ACID, PLASMA: Lactic Acid, Venous: 1.2 mmol/L (ref 0.5–1.9)

## 2020-01-07 IMAGING — CT CT HEAD W/O CM
3 series · 15 of 47 positions shown, 18 images · non-contrast
Comparison: [DATE]

CLINICAL DATA: Daughter is in town to take care of patient. States
weakness started on [REDACTED]. Multiple episodes when she has had to
lower him to the floor. Pt denies fall or injury. EMS has been to
the house multiple time to assist with lifting patient from the
floor. Pt is leaning to the left side. Equal grip strength
bilaterally. No facial droop or altered sensation. "

EXAM:
CT HEAD WITHOUT CONTRAST
TECHNIQUE: Contiguous axial images were obtained from the base of the skull
through the vertex without intravenous contrast.

[Series 2: head wo · axial · 0.46mm/px · z∈[-62,+78]mm · 9 of 34 slices shown, 12 images]
[im 3/34  brain]
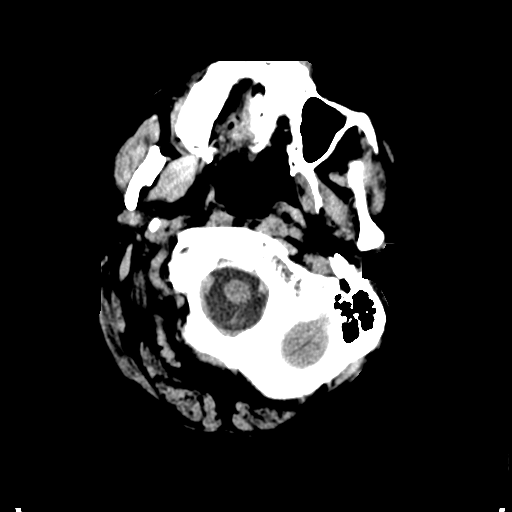
[im 3/34  bone]
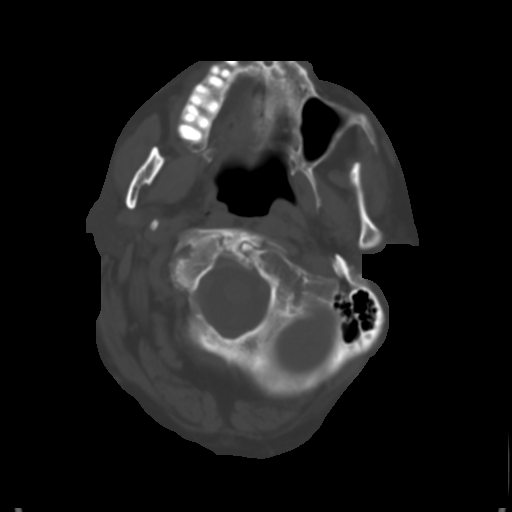
[im 6/34  brain]
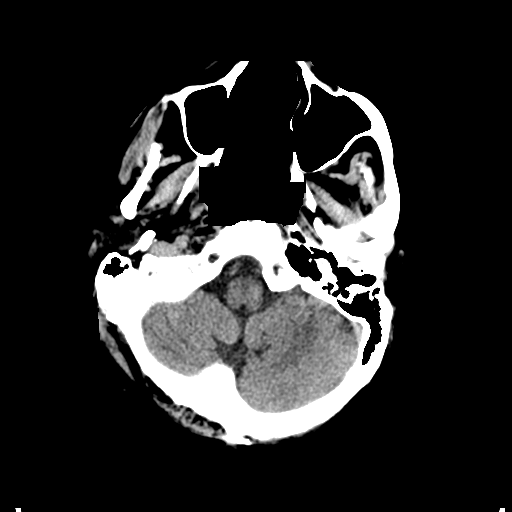
[im 10/34  brain]
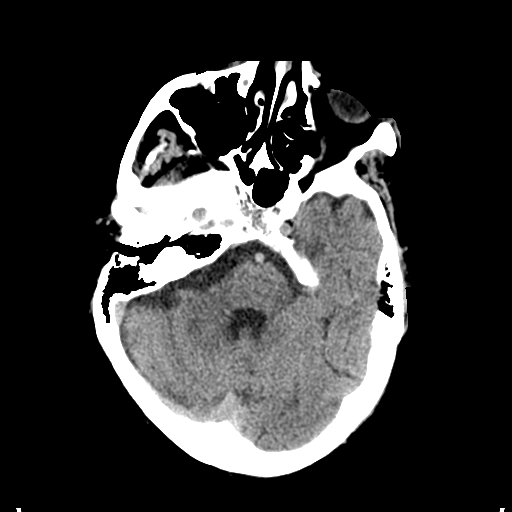
[im 13/34  brain]
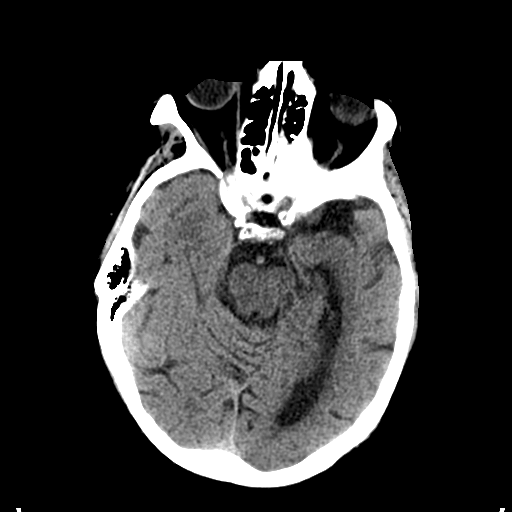
[im 18/34  brain]
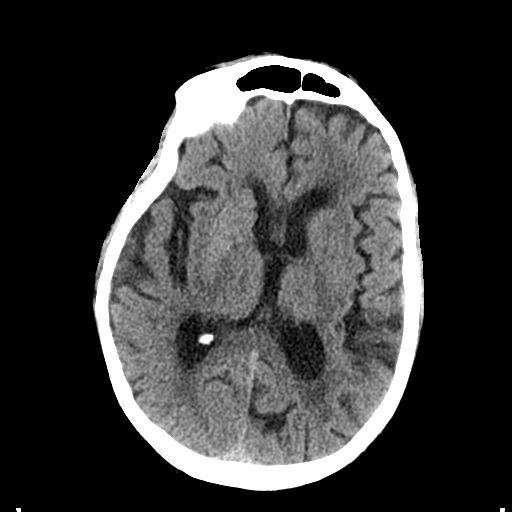
[im 18/34  bone]
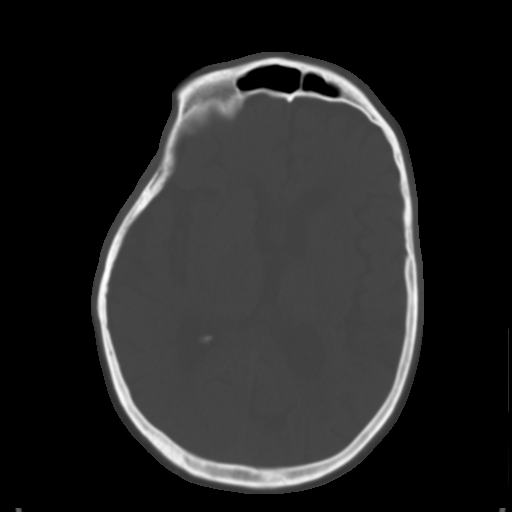
[im 21/34  brain]
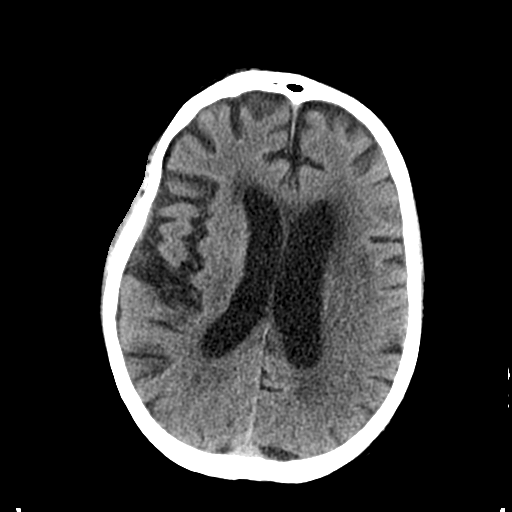
[im 24/34  brain]
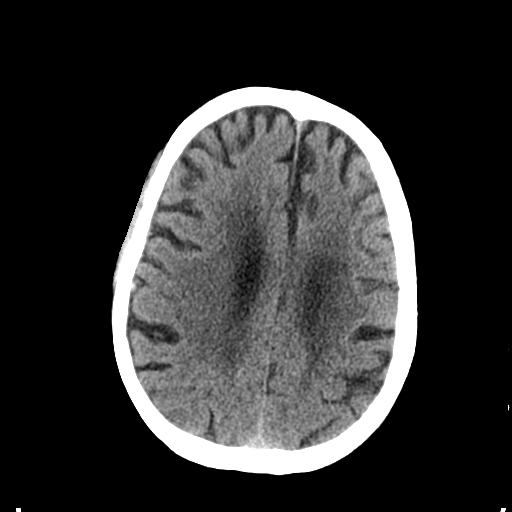
[im 28/34  brain]
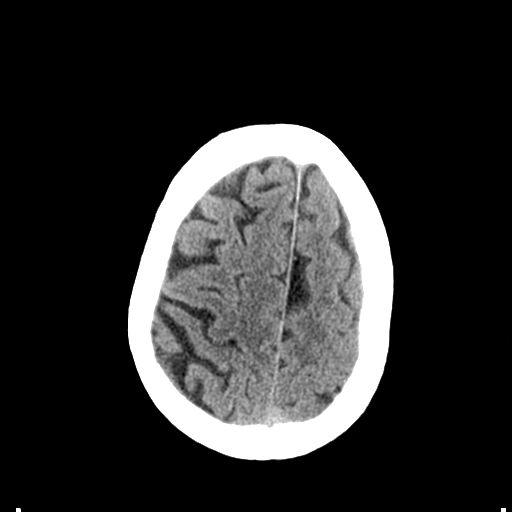
[im 31/34  brain]
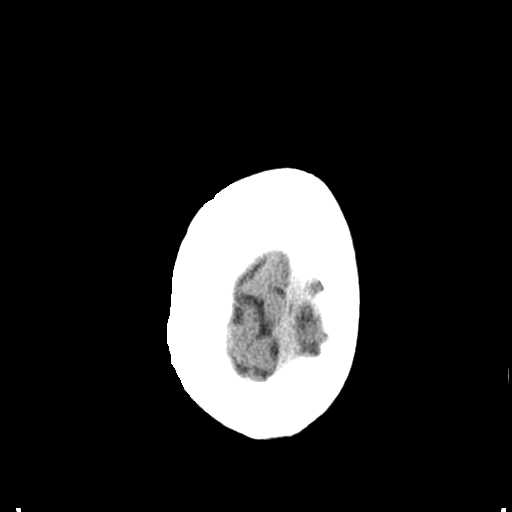
[im 31/34  bone]
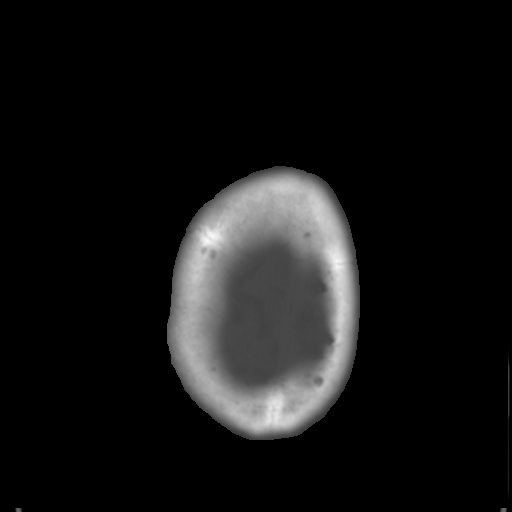

[Series 5: coronal soft tissue · coronal · 0.33mm/px · 3 of 71 slices shown]
[im 27/71  brain]
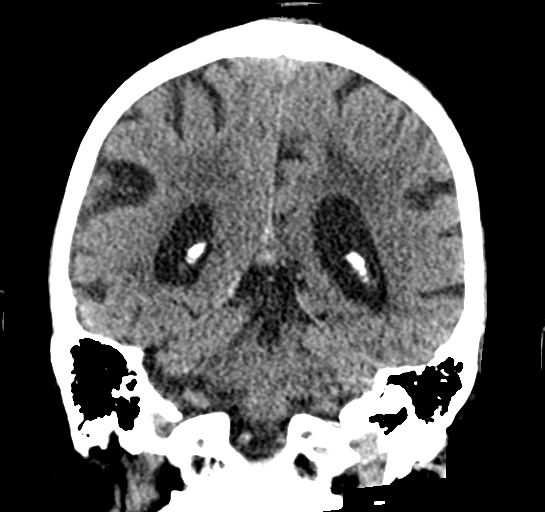
[im 33/71  brain]
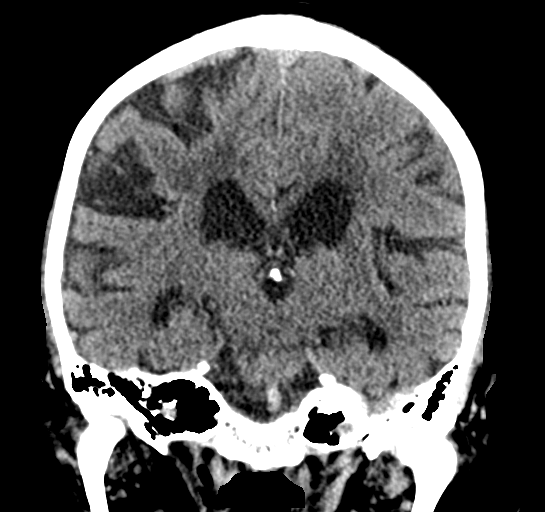
[im 38/71  brain]
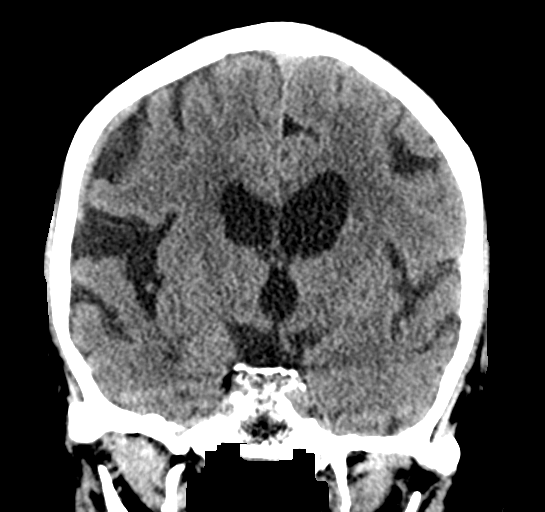

[Series 6: sagittal soft tissue · sagittal · 0.33mm/px · 3 of 61 slices shown]
[im 22/61  brain]
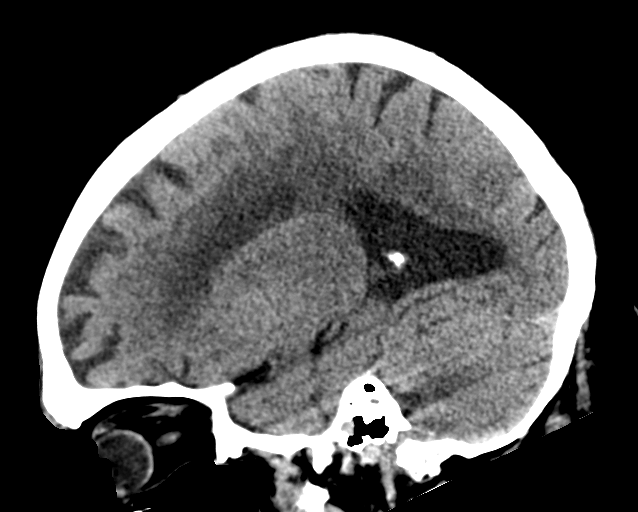
[im 31/61  brain]
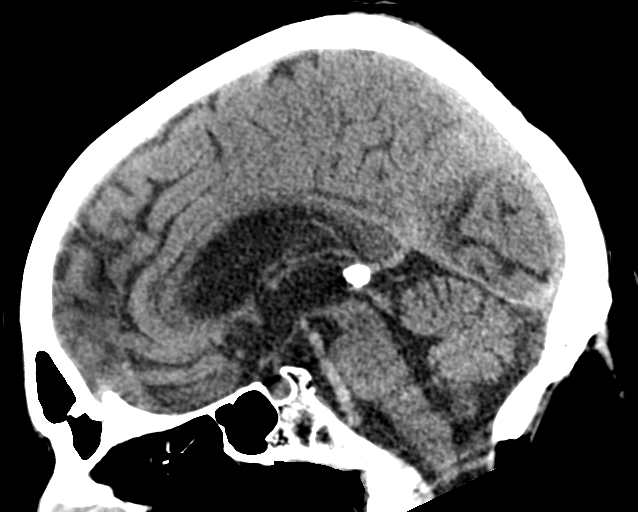
[im 40/61  brain]
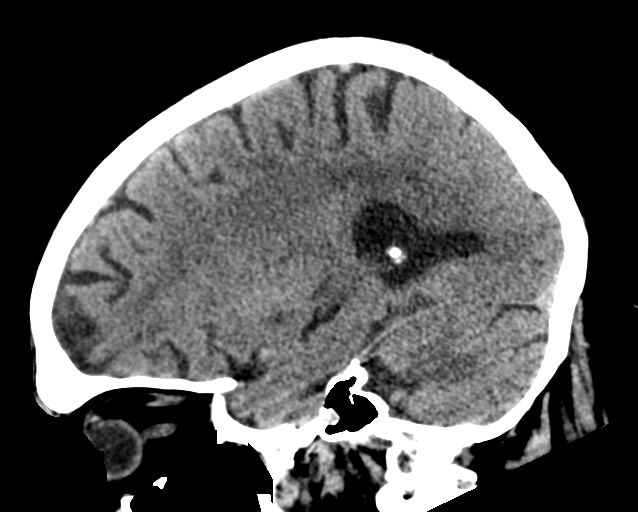

[15 of 47 positions shown; findings below may reference images not displayed]

FINDINGS: Brain: No evidence of acute infarction, hemorrhage, hydrocephalus,
extra-axial collection or mass lesion/mass effect.

There is ventricular sulcal enlargement reflecting mild to moderate
diffuse atrophy. Patchy bilateral white matter hypoattenuation is
also noted consistent with moderate chronic microvascular ischemic
change. These findings are stable.

Vascular: No hyperdense vessel or unexpected calcification.

Skull: Normal. Negative for fracture or focal lesion.

Sinuses/Orbits: Globes and orbits are unremarkable. Sinuses and
mastoid air cells are clear.

Other: None.
IMPRESSION: 1. No acute intracranial abnormalities.
2. Atrophy and chronic microvascular ischemic change. Stable
appearance from the prior head CT.

## 2020-01-07 MED ORDER — LACTATED RINGERS IV BOLUS
1000.0000 mL | Freq: Once | INTRAVENOUS | Status: AC
  Administered 2020-01-07: 1000 mL via INTRAVENOUS

## 2020-01-07 NOTE — ED Triage Notes (Signed)
Pt from home BIB EMS. Daughter is in town to take care of patient. States weakness started on Sunday. Multiple episodes when she has had to lower him to the floor. Pt denies fall or injury. EMS has been to the house multiple time to assist with lifting patient from the floor. Pt is leaning to the left side. Equal grip strength bilaterally. No facial droop or altered sensation.

## 2020-01-07 NOTE — ED Provider Notes (Signed)
11:25 PM Assumed care from Dr. Maryan Rued, please see their note for full history, physical and decision making until this point. In brief this is a 86 y.o. year old male who presented to the ED tonight with Weakness     Acute onset of weakness. Has R>L leg weakness. Known right arm weakness from fracture. Also is left leaning. Pending urine. Pending covid test. Likely needs admission.  Urine and covid negative. Suspect possible stroke vs spinal stenosis problem, likely needs MRI's, however consult teleneurology for further recommendations.   Teleneuro agrees with possible CVA vs global encephalopathy and admission.   MRI's ordered. D/w Dr. Alcario Drought who will admit.   Labs, studies and imaging reviewed by myself and considered in medical decision making if ordered. Imaging interpreted by radiology.  Labs Reviewed  CBC WITH DIFFERENTIAL/PLATELET - Abnormal; Notable for the following components:      Result Value   RBC 4.07 (*)    MCV 105.2 (*)    MCH 35.4 (*)    All other components within normal limits  COMPREHENSIVE METABOLIC PANEL - Abnormal; Notable for the following components:   Glucose, Bld 100 (*)    All other components within normal limits  RESP PANEL BY RT-PCR (FLU A&B, COVID) [id number]  LACTIC ACID, PLASMA  URINALYSIS, ROUTINE W REFLEX MICROSCOPIC  TROPONIN I (HIGH SENSITIVITY)  TROPONIN I (HIGH SENSITIVITY)    CT Head Wo Contrast  Final Result      No follow-ups on file.    Merrily Pew, MD 01/08/20 [phone]

## 2020-01-07 NOTE — ED Provider Notes (Signed)
[facility] DEPT Provider Note   CSN: [id number] Arrival date & time: 01/07/20  1843     History Chief Complaint  Patient presents with  . Weakness    Michael Ware is a 86 y.o. male.  Patient is an 86 year old male with a history of COPD, PE and DVT on Xarelto, hypertension, prior GI bleeding and more recently a fall resulting in a humerus fracture status post repair, rehab stent and has been for at home for approximately 1 month who presents today with weakness and inability to walk.  Patient and his daughter both give the history.  Daughter reports that on Sunday patient was doing 2-3 laps around the house every few hours and was walking without assistance.  He was getting up in the middle of the night and even went to the bathroom on his own.  Monday patient was ambulatory and went to his doctor's appointments and was released to start putting weight on his right upper extremity.  Daughter reports that Monday night he slept all night until early Tuesday morning and she went to take him to the bathroom and he could not get out of bed on his own.  She had to help him and on the way back from the bathroom when he was attempting to get back in bed his legs gave out and he was not able to get himself back in bed and his daughter lowered him to the floor.  She reports he slept more yesterday and again was unable to even complete his physical therapy yesterday when he completed a whole course the day before.  Today was the same thing she had to put him in the wheelchair to go to the bathroom and they only had to walk 10 steps to get to the toilet and he only made it 5 and she again had to lower him to the floor and have EMS come and help her get him back out of the floor.  He reports his legs will just not hold his weight and they continue to give out on him.  He has not had any back pain or radicular symptoms in his legs.  Daughter has noted that he has started leaning to  his left as well and has had more trouble speaking which is very unusual for him.  Even tonight he started calling her mommy which she reports is very odd.  He is still been eating and drinking and they have not noticed any fever.  They did take him to the [facility] walk-in clinic and they were concerned that he might have something wrong with his back.  Patient reports that he has had multiple surgeries on his back as well as ablations of the nerves but it does not feel any different than normal.  He denies any urinary symptoms.  He denies cough, chest pain or abdominal pain.  He did take a mirtazapine last night for sleep but he did take mirtazapine last night for sleep but they deny any new medications in the last 1 month.  The history is provided by the patient.  Weakness      Past Medical History:  Diagnosis Date  . Acute GI bleeding 01/19/13   most likely due to diverticulosis  . BPH (benign prostatic hypertrophy) with urinary obstruction   . Bradycardia   . Diverticulosis   . Elevated hemoglobin A1c   . Hyperglycemia   . Hyperlipidemia   . Hypertension   . Internal hemorrhoids with  bleeding and fecal soiling 11/19/2014  . Rectal bleed 01/19/13  . Vitamin D deficiency     Patient Active Problem List   Diagnosis Date Noted  . Acute deep vein thrombosis (DVT) of left peroneal vein (HCC)   . COPD with acute exacerbation ([place])   . Pulmonary emboli ([place]) 11/10/2019  . Hypoxia   . Acute pain of left lower extremity 11/08/2019  . Rib pain on right side 11/08/2019  . Dehydration   . Bradycardia   . Near syncope   . Depression   . Lactic acidosis   . AKI (acute kidney injury) ([place])   . Hyperlipidemia   . Hypotension   . Humerus fracture 11/04/2019  . Internal hemorrhoids with bleeding and fecal soiling 11/19/2014  . Internal hemorrhoids 10/22/2014  . Medication management 12/30/2013  . Sinus bradycardia 02/16/2013  . Rectal bleeding 01/19/2013  . Routine general medical  examination at a health care facility 12/24/2012  . Essential hypertension 12/24/2012  . Mixed hyperlipidemia 12/24/2012  . Abnormal glucose 12/24/2012  . ADD (attention deficit disorder)   . Vitamin D deficiency   . Elevated hemoglobin A1c   . BPH (benign prostatic hypertrophy) with urinary obstruction   . Diverticulosis of colon 01/23/2011    Past Surgical History:  Procedure Laterality Date  . ANAL FISSURE REPAIR  1980  . HEMORRHOID BANDING    . ORIF HUMERUS FRACTURE Right 11/07/2019   Procedure: OPEN REDUCTION INTERNAL FIXATION (ORIF) DISTAL HUMERUS FRACTURE;  Surgeon: Justice Britain, MD;  Location: [facility];  Service: Orthopedics;  Laterality: Right;  . rectal fissure procedure  1970's  . ROTATOR CUFF REPAIR     bilateral  . TONSILLECTOMY         Family History  Problem Relation Age of Onset  . Heart disease Mother   . Heart disease Father   . Colon cancer Neg Hx     Social History   Tobacco Use  . Smoking status: Former Smoker    Quit date: 02/06/1998    Years since quitting: 21.9  . Smokeless tobacco: Former Network engineer Use Topics  . Alcohol use: Yes    Alcohol/week: 2.0 - 3.0 standard drinks    Types: 2 - 3 drink(s) per week    Comment: couple drinks weekly  . Drug use: No    Home Medications Prior to Admission medications   Medication Sig Start Date End Date Taking? Authorizing Provider  B Complex-C (B-COMPLEX WITH VITAMIN C) tablet Take 1 tablet by mouth daily.     [provider]  calcium carbonate (OS-CAL) 600 MG TABS tablet Take 600 mg by mouth daily.     [provider]  Cholecalciferol (VITAMIN D3) 5000 UNITS CAPS Take 5,000 Units by mouth daily.     [provider]  CRANBERRY EXTRACT PO Take 1 capsule by mouth daily.     [provider]  Cyanocobalamin (VITAMIN B-12 CR PO) Take 1 tablet by mouth daily.    [provider]  FLUoxetine (PROZAC) 10 MG capsule Take 10 mg by mouth daily. 10/23/19   [provider]  Garlic 10 MG CAPS Take 10 mg by mouth daily.     [provider]  magnesium gluconate (MAGONATE) 500 MG tablet Take 500 mg by mouth daily.     [provider]  naproxen sodium (ALEVE) 220 MG tablet Take 440 mg by mouth daily as needed (back pain).    [provider]  pravastatin (PRAVACHOL) 40 MG tablet  Take 40 mg by mouth daily.     [provider]  RIVAROXABAN Alveda Reasons) VTE STARTER PACK (15 & 20 MG TABLETS) Follow package directions: Take one 15mg  tablet by mouth twice a day. On day 22, switch to one 20mg  tablet once a day. Take with food. 11/14/19   Dessa Phi, DO  Saw Palmetto, Serenoa repens, (SAW PALMETTO PO) Take 1 capsule by mouth daily.     [provider]  Selenium (SELENIMIN PO) Take 1 capsule by mouth daily.     [provider]  Zinc 50 MG CAPS Take 50 mg by mouth daily.     [provider]    Allergies    Atenolol, Other, and Trazamine [trazodone & diet manage prod]  Review of Systems   Review of Systems  Neurological: Positive for weakness.  All other systems reviewed and are negative.   Physical Exam Updated Vital Signs BP (!) 162/61   Pulse 68   Temp (!) 97.3 F (36.3 C) (Oral)   Resp 18   SpO2 99%   Physical Exam Vitals and nursing note reviewed.  Constitutional:      General: He is not in acute distress.    Appearance: He is well-developed. He is obese.  HENT:     Head: Normocephalic and atraumatic.     Nose: Nose normal.     Mouth/Throat:     Mouth: Mucous membranes are moist.  Eyes:     Conjunctiva/sclera: Conjunctivae normal.     Pupils: Pupils are equal, round, and reactive to light.  Cardiovascular:     Rate and Rhythm: Normal rate and regular rhythm.     Pulses: Normal pulses.     Heart sounds: No murmur heard.   Pulmonary:     Effort: Pulmonary effort is normal. No respiratory distress.     Breath sounds: Normal breath sounds. No wheezing or rales.  Chest:      Chest wall: No tenderness.  Abdominal:     General: There is no distension.     Palpations: Abdomen is soft.     Tenderness: There is no abdominal tenderness. There is no guarding or rebound.  Musculoskeletal:        General: No tenderness. Normal range of motion.     Cervical back: Normal range of motion and neck supple.     Comments: Edema in the right upper extremity.  No notable edema in lower extremities and left upper extremity within normal limits.  Skin:    General: Skin is warm and dry.     Findings: No erythema or rash.     Comments: Stage I wound over the coccyx  Neurological:     Mental Status: He is alert and oriented to person, place, and time.     Comments: 4 out of 5 strength in the left lower extremity, 3 out of 5 strength in the right lower extremity and patient is unable to lift the leg off the bed.  Sensation is intact.  4 out of 5 plantar flexion in bilateral feet.  Mild decreased grip strength in the right upper extremity compared to the left.  Patient has occasional word finding difficulty and gets slightly frustrated.  Seems to be leaning to the left on exam.  Psychiatric:        Behavior: Behavior normal.     ED Results / Procedures / Treatments   Labs (all labs ordered are listed, but only abnormal results are displayed) Labs Reviewed  CBC WITH  DIFFERENTIAL/PLATELET - Abnormal; Notable for the following components:      Result Value   RBC 4.07 (*)    MCV 105.2 (*)    MCH 35.4 (*)    All other components within normal limits  COMPREHENSIVE METABOLIC PANEL - Abnormal; Notable for the following components:   Glucose, Bld 100 (*)    All other components within normal limits  RESP PANEL BY RT-PCR (FLU A&B, COVID) ARPGX2  LACTIC ACID, PLASMA  URINALYSIS, ROUTINE W REFLEX MICROSCOPIC  TROPONIN I (HIGH SENSITIVITY)  TROPONIN I (HIGH SENSITIVITY)    EKG EKG Interpretation  Date/Time:  Wednesday January 07 2020 20:37:39 EST Ventricular Rate:  65 PR  Interval:    QRS Duration: 79 QT Interval:  411 QTC Calculation: 428 R Axis:   43 Text Interpretation: Sinus rhythm Borderline low voltage, extremity leads No significant change since last tracing Confirmed by Blanchie Dessert ([id number]) on 01/07/2020 9:15:29 PM   Radiology CT Head Wo Contrast  Result Date: 01/07/2020 CLINICAL DATA:  Daughter is in town to take care of patient. States weakness started on Sunday. Multiple episodes when she has had to lower him to the floor. Pt denies fall or injury. EMS has been to the house multiple time to assist with lifting patient from the floor. Pt is leaning to the left side. Equal grip strength bilaterally. No facial droop or altered sensation. " EXAM: CT HEAD WITHOUT CONTRAST TECHNIQUE: Contiguous axial images were obtained from the base of the skull through the vertex without intravenous contrast. COMPARISON:  11/12/2019 FINDINGS: Brain: No evidence of acute infarction, hemorrhage, hydrocephalus, extra-axial collection or mass lesion/mass effect. There is ventricular sulcal enlargement reflecting mild to moderate diffuse atrophy. Patchy bilateral white matter hypoattenuation is also noted consistent with moderate chronic microvascular ischemic change. These findings are stable. Vascular: No hyperdense vessel or unexpected calcification. Skull: Normal. Negative for fracture or focal lesion. Sinuses/Orbits: Globes and orbits are unremarkable. Sinuses and mastoid air cells are clear. Other: None. IMPRESSION: 1. No acute intracranial abnormalities. 2. Atrophy and chronic microvascular ischemic change. Stable appearance from the prior head CT. Electronically Signed   By: Lajean Manes M.D.   On: 01/07/2020 20:51    Procedures Procedures (including critical care time)  Medications Ordered in ED Medications  lactated ringers bolus 1,000 mL (has no administration in time range)    ED Course  I have reviewed the triage vital signs and the nursing  notes.  Pertinent labs & imaging results that were available during my care of the patient were reviewed by me and considered in my medical decision making (see chart for details).    MDM Rules/Calculators/A&P                          Patient is an elderly male who presented today due to generalized weakness, inability to get out of bed or walk which is new since Monday evening.  Patient has no pain and has not had fever.  He denies any urinary symptoms.  He does have generalized weakness to bilateral lower extremities right greater than left.  He does have a history of spinal stenosis but is not having new back pain and denies any falls.  Concern for possible stroke versus infection versus COVID.  Patient has had no recent medication changes to suggest the sudden decline. CBC is within normal limits and CMP within normal limits, lactate within normal limits.  UA is pending.  Troponin within normal limits  and EKG without acute findings.  Head CT is negative for acute stroke or bleed.  Patient was given IV fluids as daughter reports he was not drinking well at baseline because he does not like water.  However he had been eating normally.  Final Clinical Impression(s) / ED Diagnoses Final diagnoses:  None    Rx / DC Orders ED Discharge Orders    None       Blanchie Dessert, MD 01/07/20 2356

## 2020-01-08 ENCOUNTER — Emergency Department (HOSPITAL_COMMUNITY): Payer: Medicare Other

## 2020-01-08 ENCOUNTER — Observation Stay (HOSPITAL_COMMUNITY): Payer: Medicare Other

## 2020-01-08 ENCOUNTER — Encounter (HOSPITAL_COMMUNITY): Payer: Self-pay | Admitting: Internal Medicine

## 2020-01-08 ENCOUNTER — Other Ambulatory Visit: Payer: Self-pay

## 2020-01-08 DIAGNOSIS — R29898 Other symptoms and signs involving the musculoskeletal system: Secondary | ICD-10-CM | POA: Diagnosis not present

## 2020-01-08 DIAGNOSIS — J440 Chronic obstructive pulmonary disease with acute lower respiratory infection: Secondary | ICD-10-CM | POA: Diagnosis not present

## 2020-01-08 DIAGNOSIS — E559 Vitamin D deficiency, unspecified: Secondary | ICD-10-CM | POA: Diagnosis not present

## 2020-01-08 DIAGNOSIS — E782 Mixed hyperlipidemia: Secondary | ICD-10-CM | POA: Diagnosis not present

## 2020-01-08 DIAGNOSIS — G9341 Metabolic encephalopathy: Secondary | ICD-10-CM | POA: Diagnosis not present

## 2020-01-08 DIAGNOSIS — F05 Delirium due to known physiological condition: Secondary | ICD-10-CM | POA: Diagnosis not present

## 2020-01-08 DIAGNOSIS — K5790 Diverticulosis of intestine, part unspecified, without perforation or abscess without bleeding: Secondary | ICD-10-CM | POA: Diagnosis not present

## 2020-01-08 DIAGNOSIS — I6503 Occlusion and stenosis of bilateral vertebral arteries: Secondary | ICD-10-CM | POA: Diagnosis not present

## 2020-01-08 DIAGNOSIS — R531 Weakness: Secondary | ICD-10-CM | POA: Diagnosis not present

## 2020-01-08 DIAGNOSIS — J9601 Acute respiratory failure with hypoxia: Secondary | ICD-10-CM | POA: Diagnosis not present

## 2020-01-08 DIAGNOSIS — Z8669 Personal history of other diseases of the nervous system and sense organs: Secondary | ICD-10-CM | POA: Diagnosis not present

## 2020-01-08 DIAGNOSIS — G9389 Other specified disorders of brain: Secondary | ICD-10-CM | POA: Diagnosis not present

## 2020-01-08 DIAGNOSIS — R410 Disorientation, unspecified: Secondary | ICD-10-CM | POA: Diagnosis not present

## 2020-01-08 DIAGNOSIS — R1310 Dysphagia, unspecified: Secondary | ICD-10-CM | POA: Diagnosis not present

## 2020-01-08 DIAGNOSIS — I6389 Other cerebral infarction: Secondary | ICD-10-CM | POA: Diagnosis not present

## 2020-01-08 DIAGNOSIS — M4186 Other forms of scoliosis, lumbar region: Secondary | ICD-10-CM | POA: Diagnosis not present

## 2020-01-08 DIAGNOSIS — E785 Hyperlipidemia, unspecified: Secondary | ICD-10-CM | POA: Diagnosis not present

## 2020-01-08 DIAGNOSIS — N401 Enlarged prostate with lower urinary tract symptoms: Secondary | ICD-10-CM | POA: Diagnosis not present

## 2020-01-08 DIAGNOSIS — M5116 Intervertebral disc disorders with radiculopathy, lumbar region: Secondary | ICD-10-CM | POA: Diagnosis not present

## 2020-01-08 DIAGNOSIS — M47814 Spondylosis without myelopathy or radiculopathy, thoracic region: Secondary | ICD-10-CM | POA: Diagnosis not present

## 2020-01-08 DIAGNOSIS — I672 Cerebral atherosclerosis: Secondary | ICD-10-CM | POA: Diagnosis not present

## 2020-01-08 DIAGNOSIS — M6258 Muscle wasting and atrophy, not elsewhere classified, other site: Secondary | ICD-10-CM | POA: Diagnosis not present

## 2020-01-08 DIAGNOSIS — Z87891 Personal history of nicotine dependence: Secondary | ICD-10-CM | POA: Diagnosis not present

## 2020-01-08 DIAGNOSIS — R29810 Facial weakness: Secondary | ICD-10-CM | POA: Diagnosis not present

## 2020-01-08 DIAGNOSIS — Z86718 Personal history of other venous thrombosis and embolism: Secondary | ICD-10-CM | POA: Diagnosis not present

## 2020-01-08 DIAGNOSIS — J690 Pneumonitis due to inhalation of food and vomit: Secondary | ICD-10-CM | POA: Diagnosis not present

## 2020-01-08 DIAGNOSIS — Z8249 Family history of ischemic heart disease and other diseases of the circulatory system: Secondary | ICD-10-CM | POA: Diagnosis not present

## 2020-01-08 DIAGNOSIS — Z20822 Contact with and (suspected) exposure to covid-19: Secondary | ICD-10-CM | POA: Diagnosis not present

## 2020-01-08 DIAGNOSIS — Z885 Allergy status to narcotic agent status: Secondary | ICD-10-CM | POA: Diagnosis not present

## 2020-01-08 DIAGNOSIS — I6523 Occlusion and stenosis of bilateral carotid arteries: Secondary | ICD-10-CM | POA: Diagnosis not present

## 2020-01-08 DIAGNOSIS — Z86711 Personal history of pulmonary embolism: Secondary | ICD-10-CM | POA: Diagnosis not present

## 2020-01-08 DIAGNOSIS — Z7901 Long term (current) use of anticoagulants: Secondary | ICD-10-CM | POA: Diagnosis not present

## 2020-01-08 DIAGNOSIS — R270 Ataxia, unspecified: Secondary | ICD-10-CM | POA: Diagnosis not present

## 2020-01-08 DIAGNOSIS — L89159 Pressure ulcer of sacral region, unspecified stage: Secondary | ICD-10-CM | POA: Diagnosis not present

## 2020-01-08 DIAGNOSIS — R4781 Slurred speech: Secondary | ICD-10-CM | POA: Diagnosis not present

## 2020-01-08 DIAGNOSIS — G459 Transient cerebral ischemic attack, unspecified: Secondary | ICD-10-CM | POA: Diagnosis present

## 2020-01-08 DIAGNOSIS — F419 Anxiety disorder, unspecified: Secondary | ICD-10-CM | POA: Diagnosis not present

## 2020-01-08 DIAGNOSIS — I10 Essential (primary) hypertension: Secondary | ICD-10-CM | POA: Diagnosis not present

## 2020-01-08 DIAGNOSIS — Z66 Do not resuscitate: Secondary | ICD-10-CM | POA: Diagnosis not present

## 2020-01-08 LAB — URINALYSIS, ROUTINE W REFLEX MICROSCOPIC
Bilirubin Urine: NEGATIVE
Glucose, UA: NEGATIVE mg/dL
Hgb urine dipstick: NEGATIVE
Ketones, ur: 5 mg/dL — AB
Leukocytes,Ua: NEGATIVE
Nitrite: NEGATIVE
Protein, ur: NEGATIVE mg/dL
Specific Gravity, Urine: 1.016 (ref 1.005–1.030)
pH: 5 (ref 5.0–8.0)

## 2020-01-08 LAB — RESP PANEL BY RT-PCR (FLU A&B, COVID) ARPGX2
Influenza A by PCR: NEGATIVE
Influenza B by PCR: NEGATIVE
SARS Coronavirus 2 by RT PCR: NEGATIVE

## 2020-01-08 LAB — FOLATE: Folate: 17 ng/mL (ref >5.9)

## 2020-01-08 LAB — VITAMIN B12: Vitamin B-12: 766 pg/mL (ref 180–914)

## 2020-01-08 LAB — TROPONIN I (HIGH SENSITIVITY): Troponin I (High Sensitivity): 4 ng/L (ref <18)

## 2020-01-08 IMAGING — DX DG CHEST 1V PORT
1 series · 1 of 1 positions shown · non-contrast
Comparison: [DATE]

CLINICAL DATA: 88-year-old male with cerebrovascular accident
presenting with confusion and RIGHT lower extremity weakness and
ataxia

EXAM:
PORTABLE CHEST 1 VIEW

[chest ap]
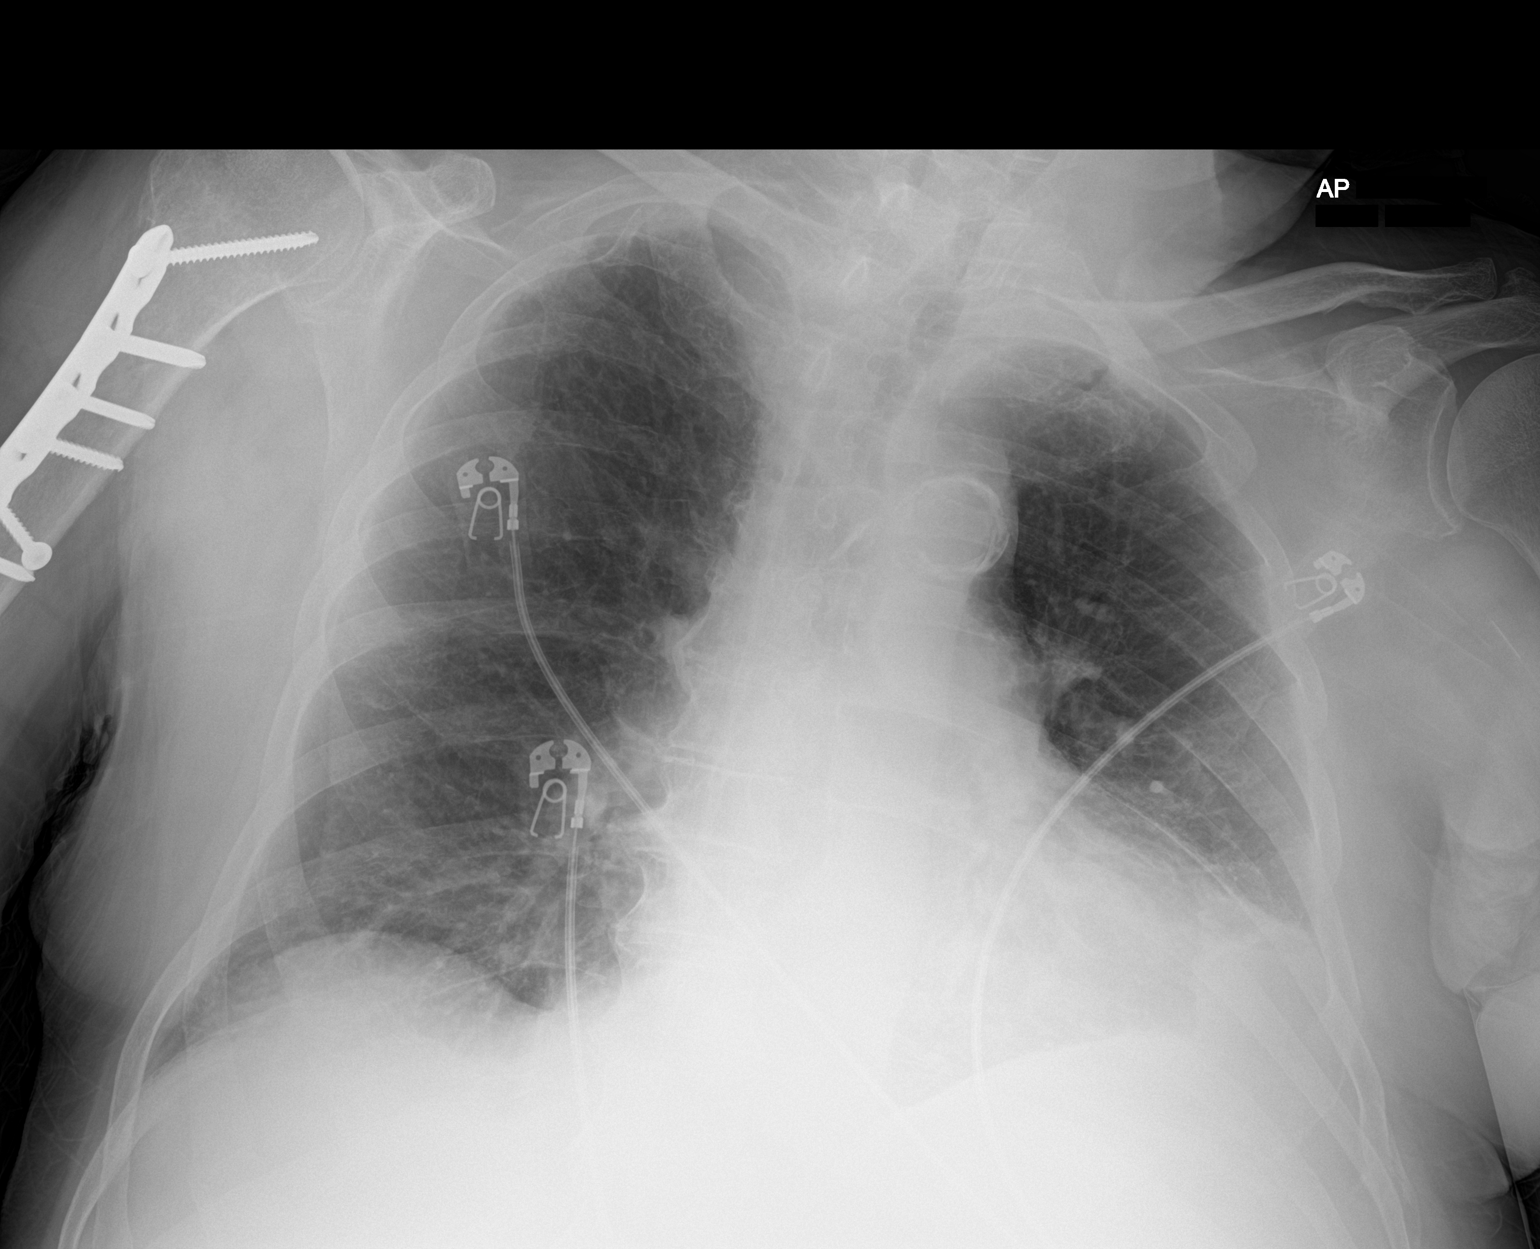

[1 of 1 positions shown; findings below may reference images not displayed]

FINDINGS: Image rotated to the LEFT. Accounting for this cardiomediastinal
contours are stable. The LEFT basilar airspace disease with partial
obscuration of LEFT hemidiaphragm versus is accentuation of cardiac
fat pad due to rotation projecting over this area.

RIGHT lung is clear.

On limited assessment no acute skeletal process.
IMPRESSION: LEFT basilar airspace disease versus accentuation of cardiac fat
pad. Could represent developing infection. Continued follow-up
radiographs without rotation may be helpful to exclude the
possibility of developing infection in this location.

## 2020-01-08 IMAGING — MR MR THORACIC SPINE W/O CM
10 of 11 series · 38 of 48 positions shown · non-contrast
Comparison: Chest CT from [DATE]

CLINICAL DATA: Cord compression, a tracks E a. Nontraumatic.
Worsening right lower extremity weakness and ataxia.

EXAM:
MRI THORACIC SPINE WITHOUT CONTRAST
TECHNIQUE: Multiplanar, multisequence MR imaging of the thoracic spine was
performed. No intravenous contrast was administered.

[Series 16: T1 · sagittal · 4.0mm · 1.72mm/px · 1 of 5 slices shown (1 of 4)]
[im 1/5]
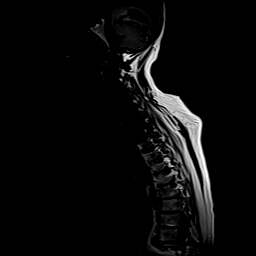

[Series 17: T2 · sagittal · 3.0mm · 0.89mm/px · 2 of 15 slices shown (1 of 4)]
[im 1/15]
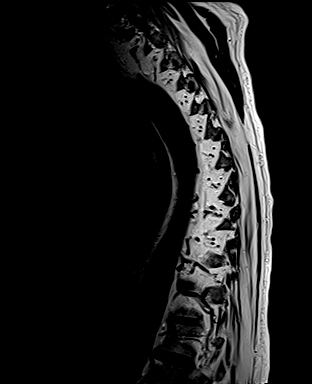
[im 15/15]
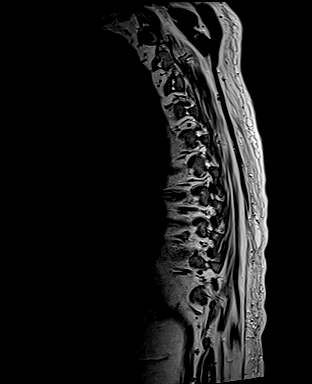

[Series 18: STIR · sagittal · 3.0mm · 1.06mm/px · 3 of 15 slices shown (1 of 2)]
[im 1/15]
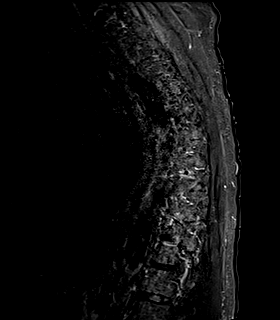
[im 8/15]
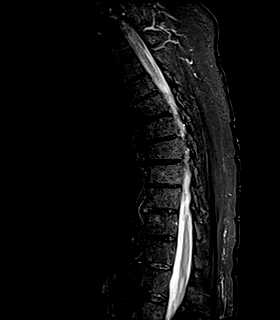
[im 15/15]
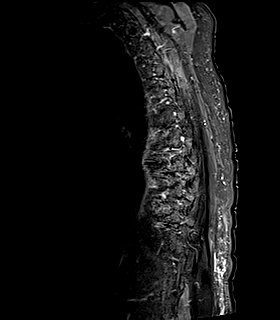

[Series 19: T1 · sagittal · 3.0mm · 1.06mm/px · 3 of 15 slices shown (2 of 4)]
[im 1/15]
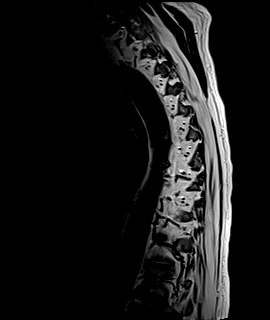
[im 8/15]
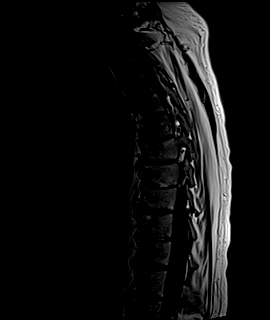
[im 15/15]
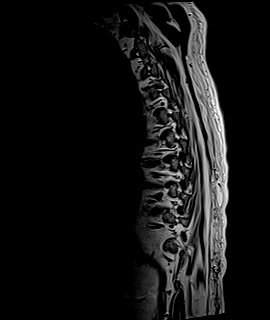

[Series 20: T2 · axial · 4.0mm · 0.78mm/px · z∈[-290,-39]mm · 8 of 44 slices shown (2 of 4)]
[im 1/44]
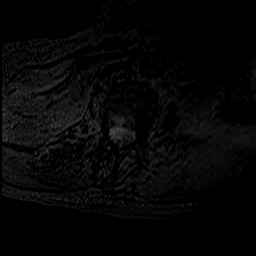
[im 7/44]
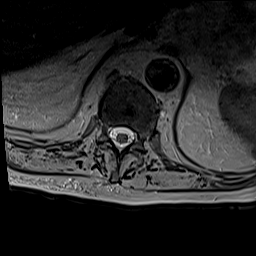
[im 13/44]
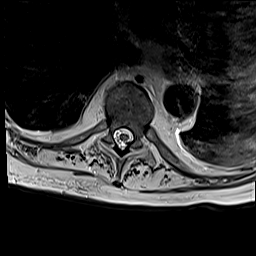
[im 19/44]
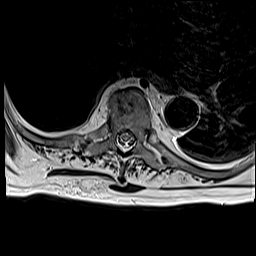
[im 25/44]
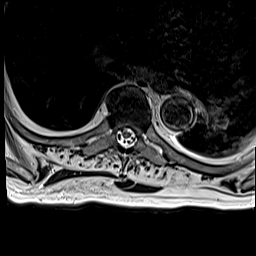
[im 31/44]
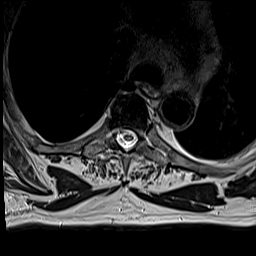
[im 37/44]
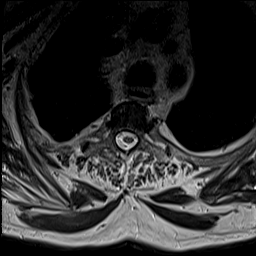
[im 44/44]
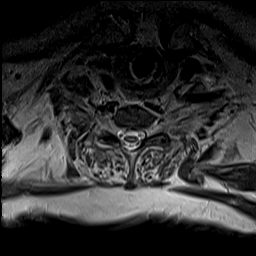

[Series 22: T1 · sagittal · 4.0mm · 0.81mm/px · 3 of 17 slices shown (3 of 4)]
[im 1/17]
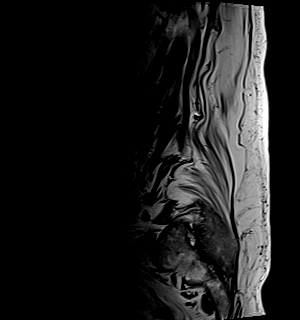
[im 9/17]
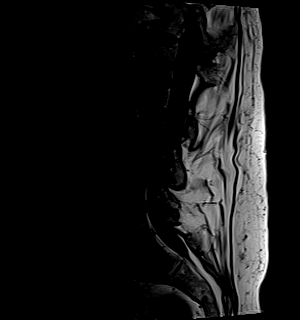
[im 17/17]
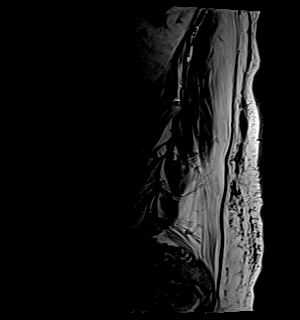

[Series 23: T2 · sagittal · 4.0mm · 0.81mm/px · 3 of 17 slices shown (3 of 4)]
[im 1/17]
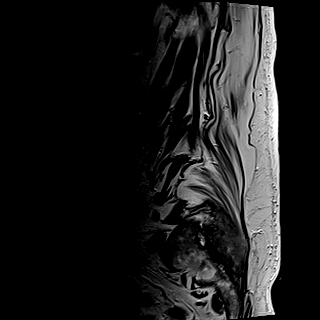
[im 9/17]
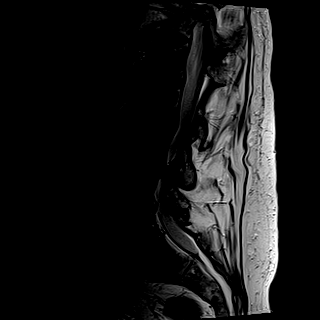
[im 17/17]
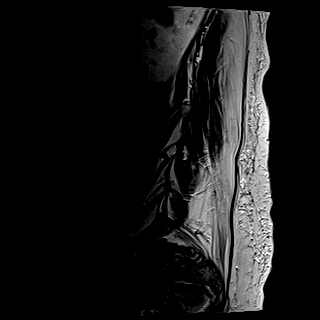

[Series 24: STIR · sagittal · 4.0mm · 0.51mm/px · 1 of 17 slices shown (2 of 2)]
[im 1/17]
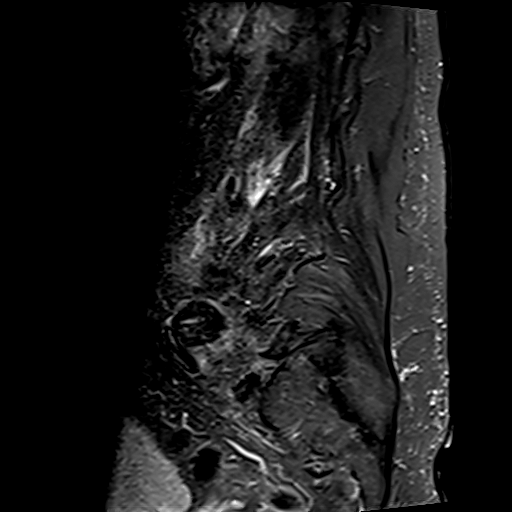

[Series 25: T2 · axial · 4.0mm · 0.62mm/px · z∈[-524,-282]mm · 7 of 41 slices shown (4 of 4)]
[im 1/41]
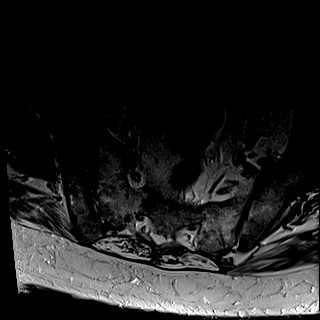
[im 7/41]
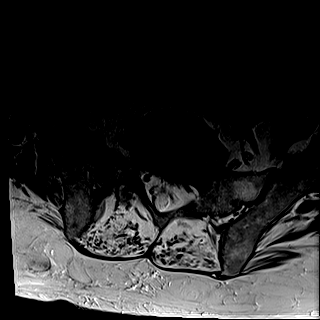
[im 14/41]
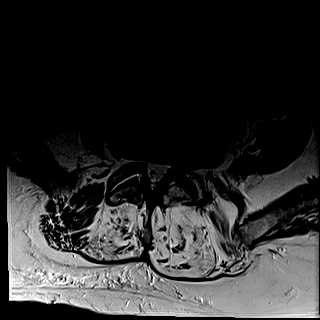
[im 21/41]
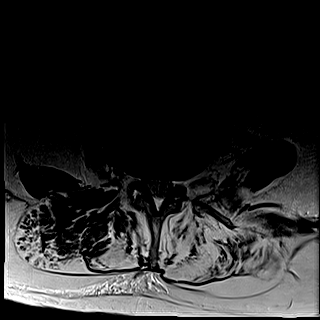
[im 27/41]
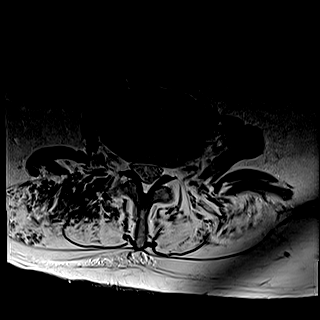
[im 34/41]
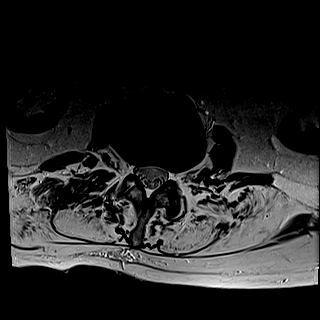
[im 41/41]
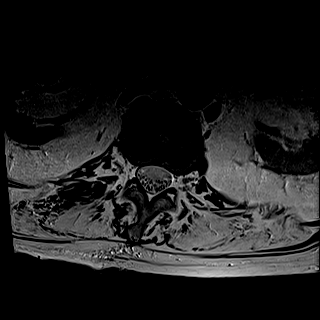

[Series 26: T1 · axial · 4.0mm · 0.39mm/px · z∈[-524,-282]mm · 7 of 41 slices shown (4 of 4)]
[im 1/41]
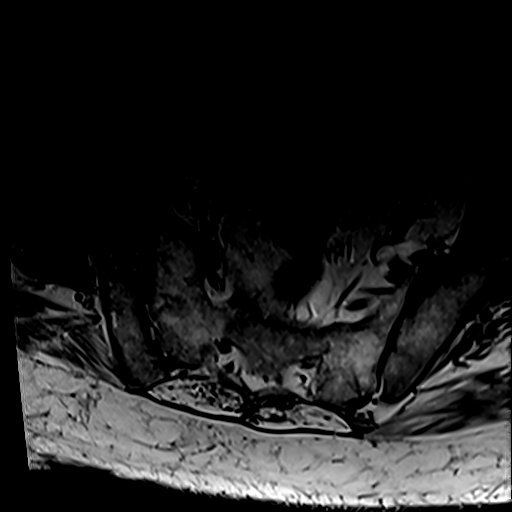
[im 7/41]
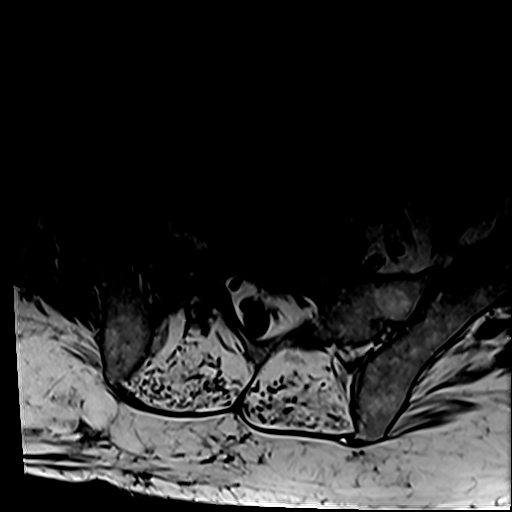
[im 14/41]
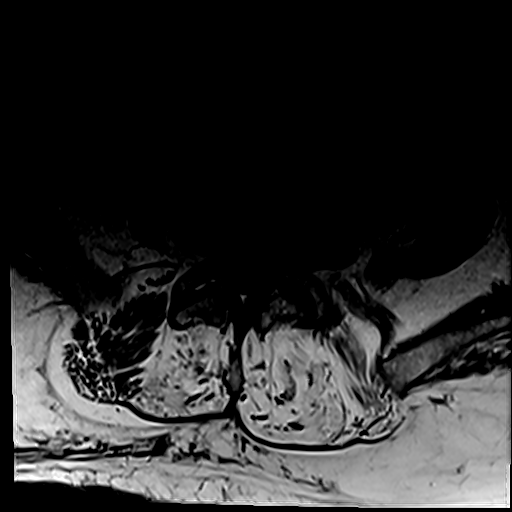
[im 21/41]
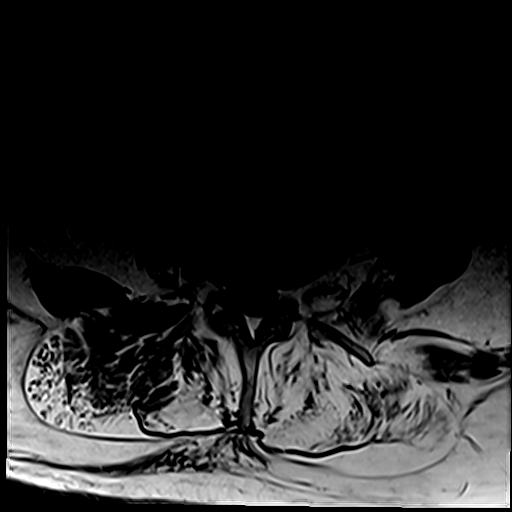
[im 27/41]
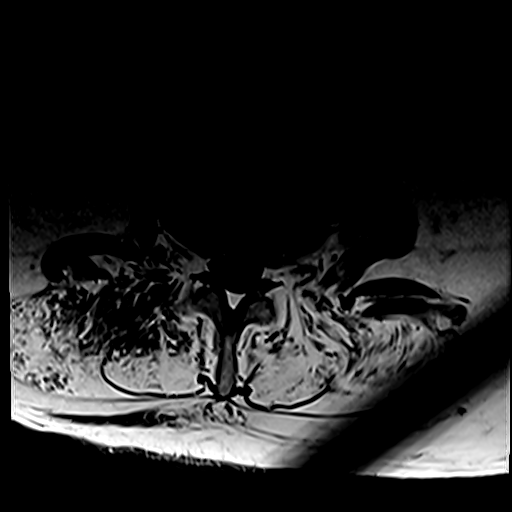
[im 34/41]
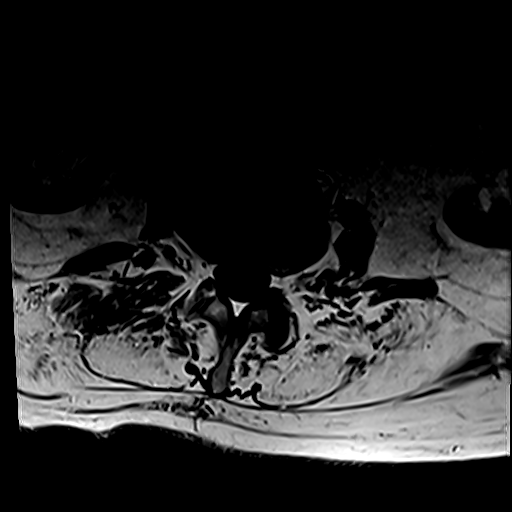
[im 41/41]
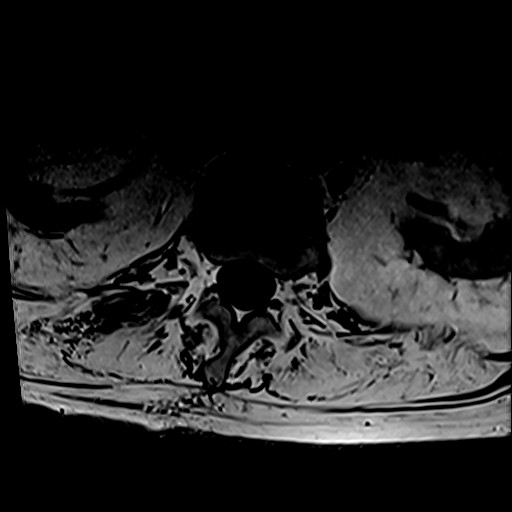

[38 of 48 positions shown; findings below may reference images not displayed]

FINDINGS: Alignment:  Negative for listhesis.

Vertebrae: No fracture, evidence of discitis, or bone lesion.

Cord:  Normal signal and morphology.

Paraspinal and other soft tissues: Marked fatty atrophy of intrinsic
back muscles throughout the thoracic spine.

Disc levels:

Generalized disc desiccation and narrowing. Spondylitic spurring
throughout the mid to lower thoracic spine. Negative facets. No
neural compression.
IMPRESSION: 1. No acute finding.  Negative MRI of the thoracic cord.
2. Generalized thoracic spondylosis.
3. Prominent axial muscular atrophy.

## 2020-01-08 IMAGING — MR MR HEAD W/O CM
10 series · 43 of 48 positions shown · non-contrast
Comparison: Head CT [DATE]

CLINICAL DATA: Confusion and worsening right lower extremity
weakness with ataxia.

EXAM:
MRI HEAD WITHOUT CONTRAST
TECHNIQUE: Multiplanar, multiecho pulse sequences of the brain and surrounding
structures were obtained without intravenous contrast.

[Series 5: dwi_tracew · axial · 3.0mm · 1.08mm/px · z∈[-52,+111]mm · 8 of 112 slices shown]
[im 1/112]
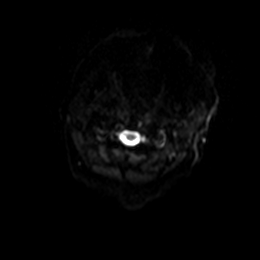
[im 23/112]
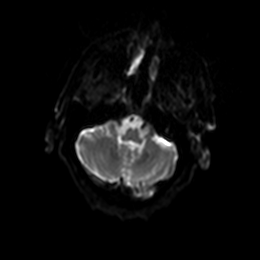
[im 34/112]
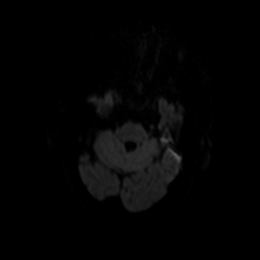
[im 45/112]
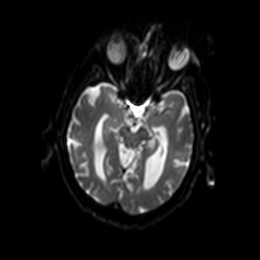
[im 67/112]
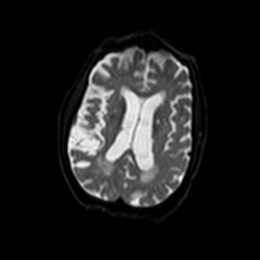
[im 78/112]
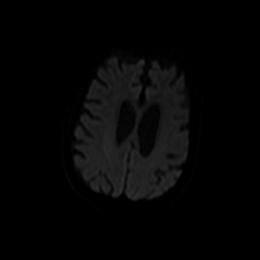
[im 89/112]
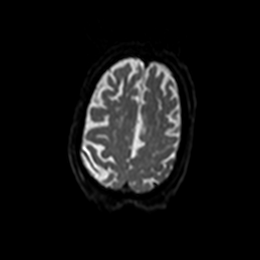
[im 112/112]
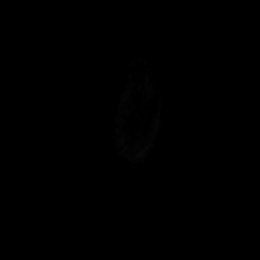

[Series 6: dwi_adc · axial · 3.0mm · 1.08mm/px · z∈[-52,+28]mm · 3 of 56 slices shown]
[im 1/56]
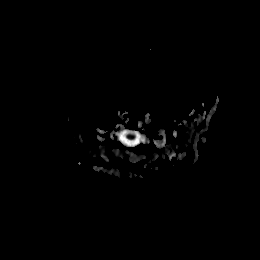
[im 14/56]
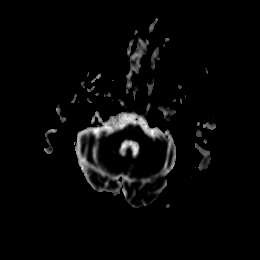
[im 28/56]
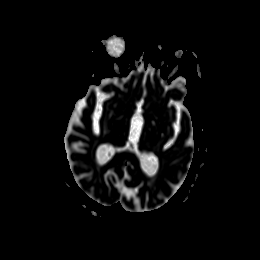

[Series 7: T2 · sagittal · 5.0mm · 0.47mm/px · 2 of 24 slices shown (1 of 3)]
[im 1/24]
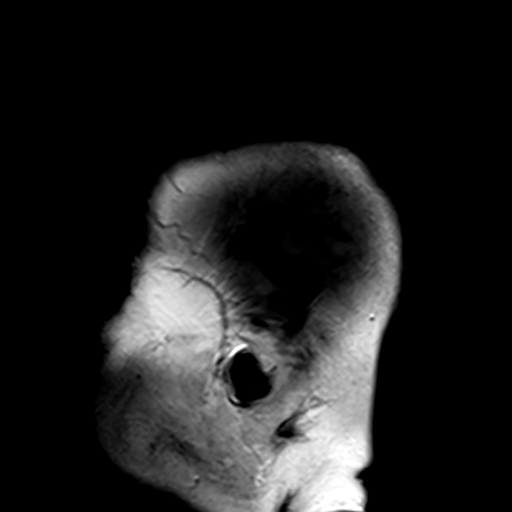
[im 24/24]
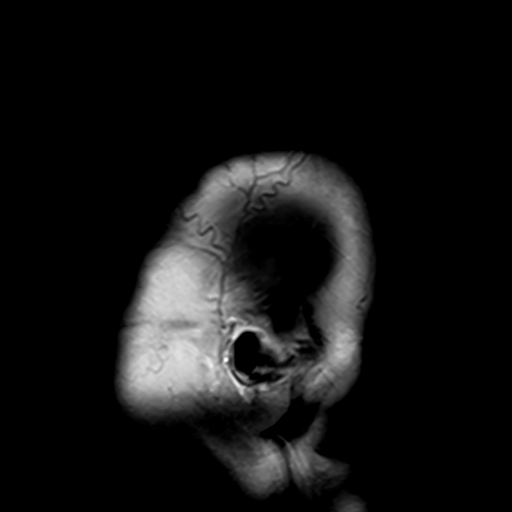

[Series 8: T2 · axial · 4.0mm · 0.45mm/px · z∈[-61,+105]mm · 3 of 33 slices shown (2 of 3)]
[im 1/33]
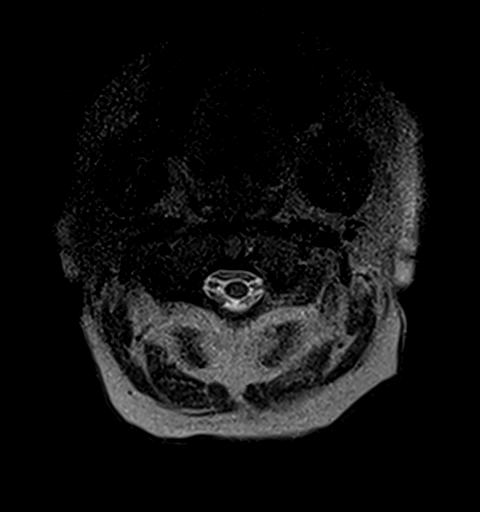
[im 17/33]
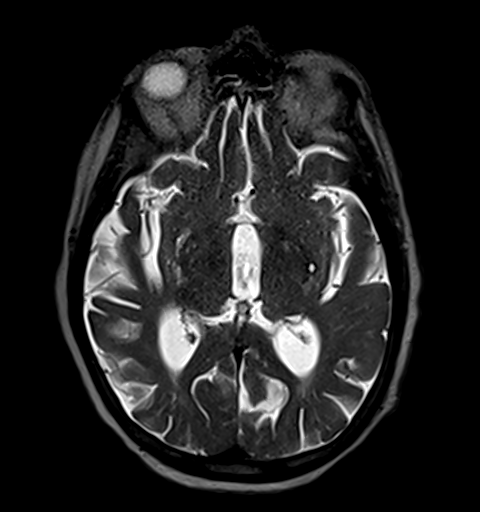
[im 33/33]
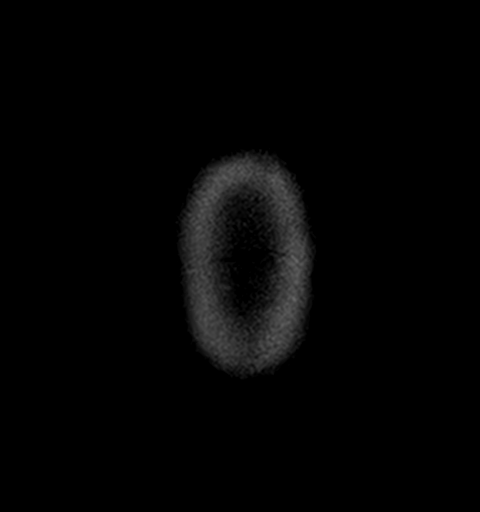

[Series 9: GRE · axial · 3.0mm · 0.45mm/px · z∈[-47,+116]mm · 5 of 56 slices shown]
[im 1/56]
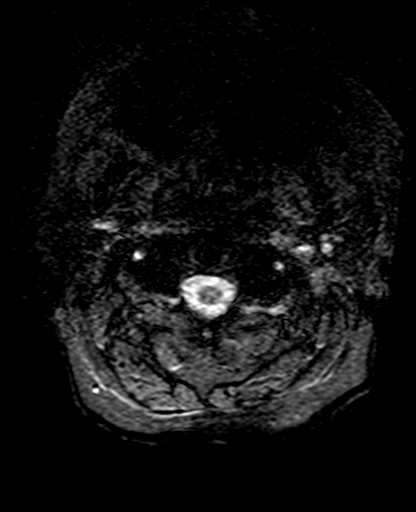
[im 14/56]
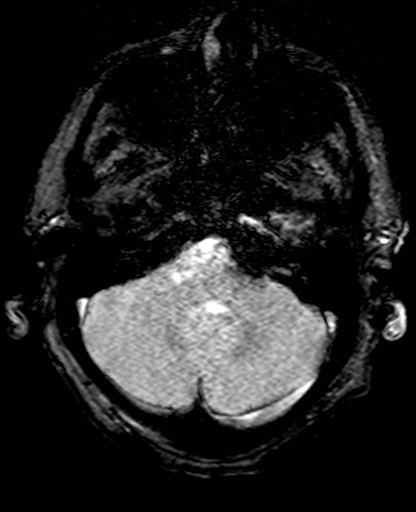
[im 28/56]
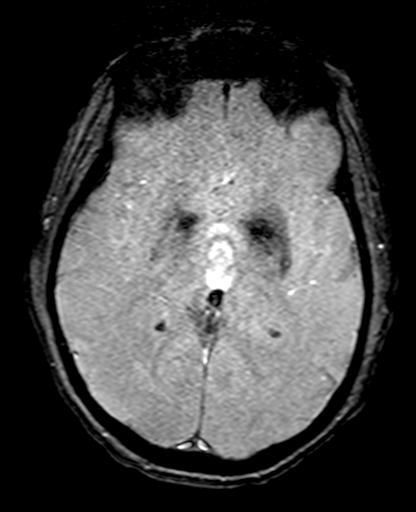
[im 42/56]
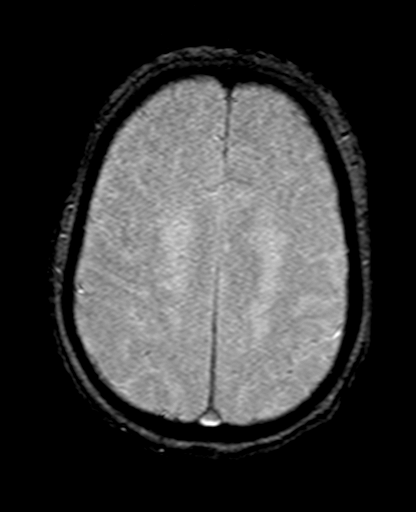
[im 56/56]
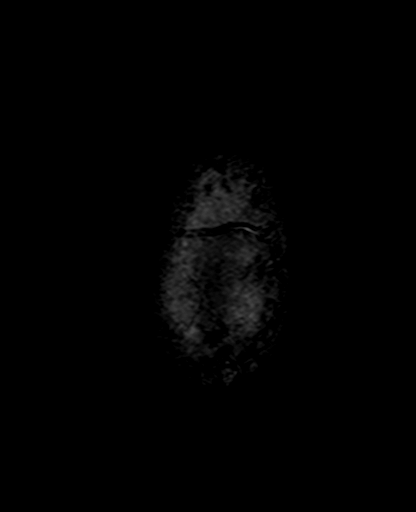

[Series 10: FLAIR · axial · 3.0mm · 0.86mm/px · z∈[-47,+114]mm · 5 of 55 slices shown]
[im 1/55]
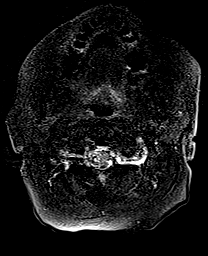
[im 14/55]
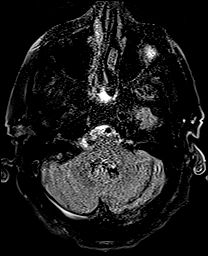
[im 28/55]
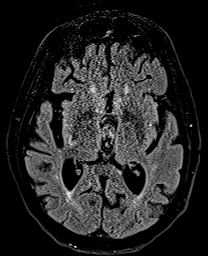
[im 41/55]
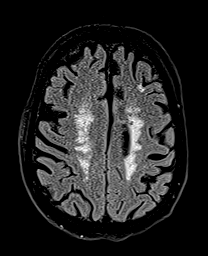
[im 55/55]
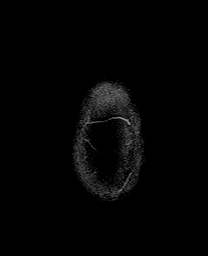

[Series 11: T1 · axial · 3.0mm · 0.45mm/px · z∈[-51,+113]mm · 5 of 56 slices shown]
[im 1/56]
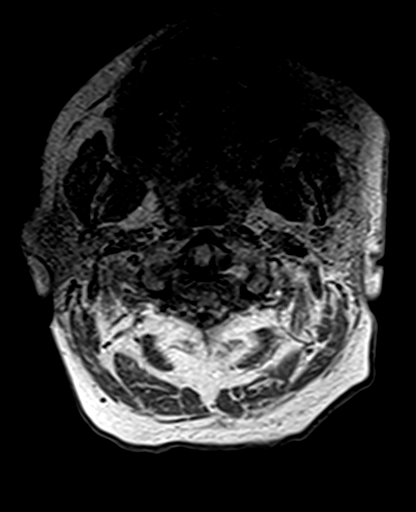
[im 14/56]
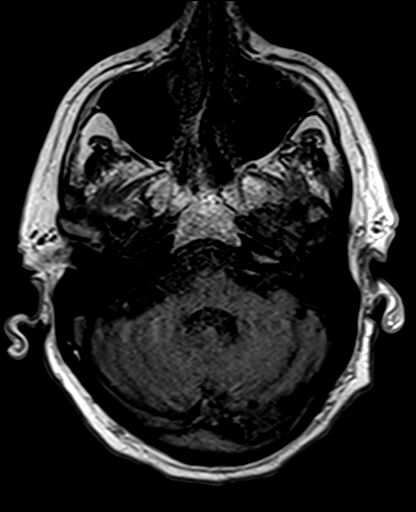
[im 28/56]
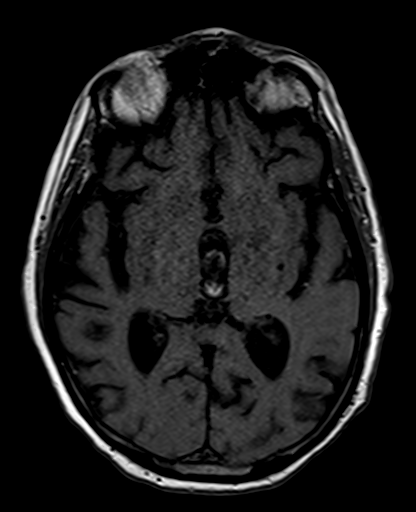
[im 42/56]
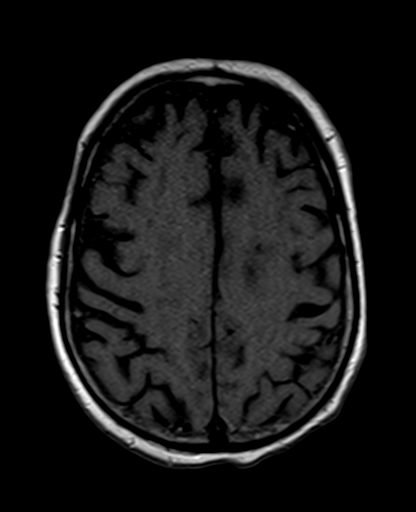
[im 56/56]
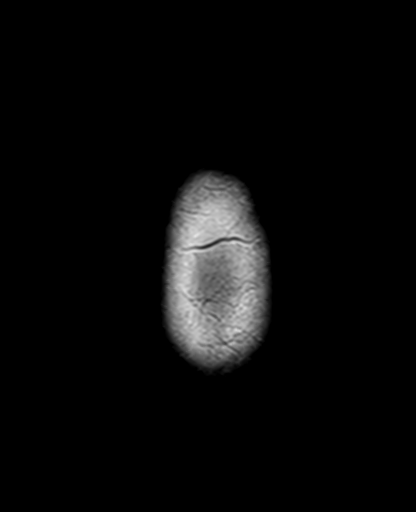

[Series 12: DWI · coronal · 5.0mm · 1.31mm/px · 6 of 64 slices shown (1 of 2)]
[im 1/64]
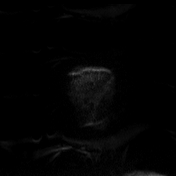
[im 13/64]
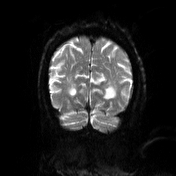
[im 26/64]
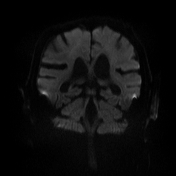
[im 38/64]
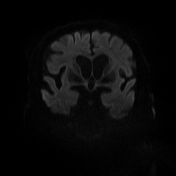
[im 51/64]
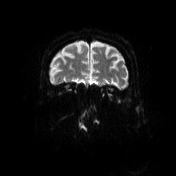
[im 64/64]
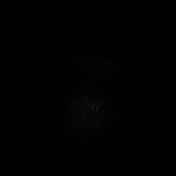

[Series 13: DWI · coronal · 5.0mm · 1.31mm/px · 3 of 32 slices shown (2 of 2)]
[im 1/32]
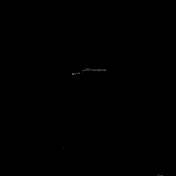
[im 16/32]
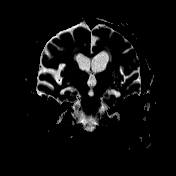
[im 32/32]
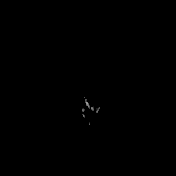

[Series 14: T2 · coronal · 5.0mm · 0.86mm/px · 3 of 29 slices shown (3 of 3)]
[im 1/29]
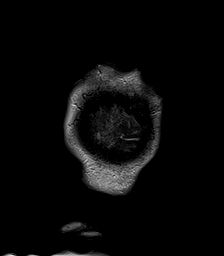
[im 15/29]
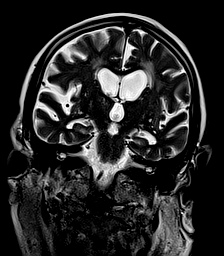
[im 29/29]
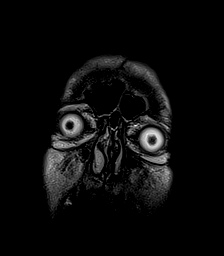

[43 of 48 positions shown; findings below may reference images not displayed]

FINDINGS: Brain: No acute infarction, hemorrhage, hydrocephalus, extra-axial
collection or mass lesion. Chronic small vessel ischemia in the
periventricular white matter, mild for age. Cerebral volume loss
which is also generalized and mild for age.

Vascular: Normal flow voids

Skull and upper cervical spine: Normal marrow signal

Sinuses/Orbits: Negative
IMPRESSION: Senescent changes without acute or reversible finding.

## 2020-01-08 IMAGING — CT CT ANGIO NECK
2 of 20 series · 4 of 33 positions shown · IV contrast (OMNIPAQUE 350)
Comparison: [DATE] CT head

CLINICAL DATA: Leg weakness

EXAM:
CT ANGIOGRAPHY HEAD AND NECK
TECHNIQUE: Multidetector CT imaging of the head and neck was performed using
the standard protocol during bolus administration of intravenous
contrast. Multiplanar CT image reconstructions and MIPs were
obtained to evaluate the vascular anatomy. Carotid stenosis
measurements (when applicable) are obtained utilizing NASCET
criteria, using the distal internal carotid diameter as the
denominator.
CONTRAST:  75mL OMNIPAQUE IOHEXOL 350 MG/ML SOLN

[Series 15: ax thin · axial · 0.43mm/px · z∈[-245,-127]mm · 2 of 355 slices shown (1 of 2)]
[im 119/355  soft-tissue]
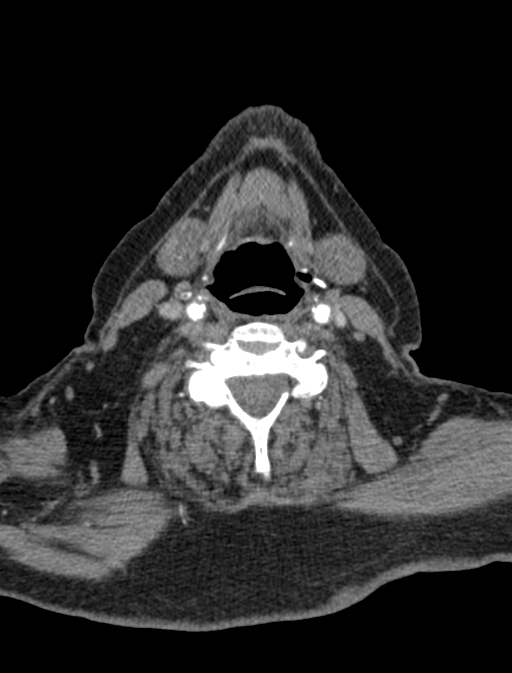
[im 237/355  soft-tissue]
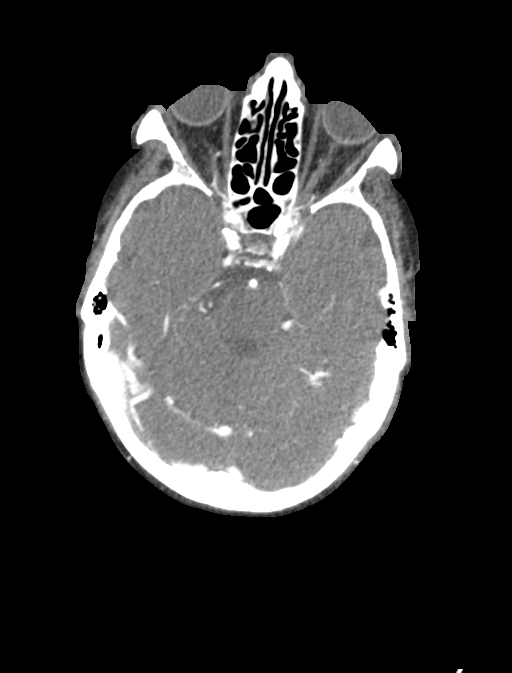

[Series 15: ax thin · axial · 0.43mm/px · z∈[-245,-127]mm · 2 of 355 slices shown (2 of 2)]
[im 119/355  soft-tissue]
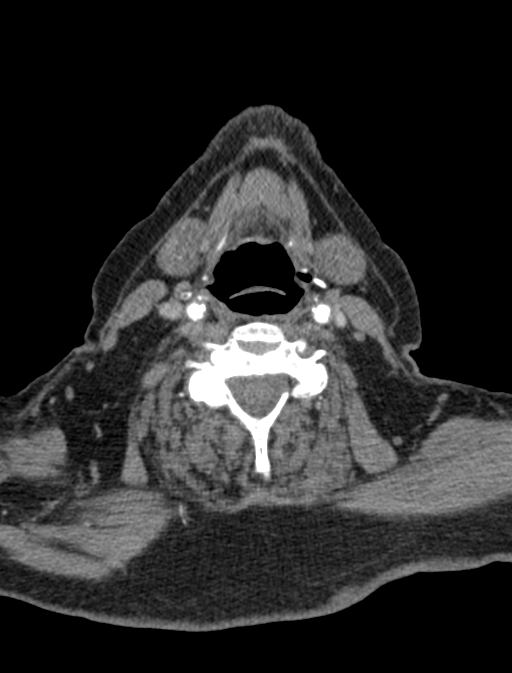
[im 237/355  bone]
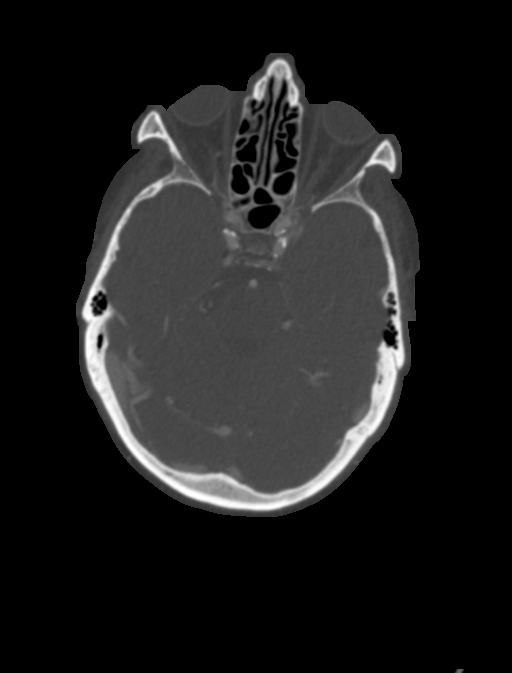

[4 of 33 positions shown; findings below may reference images not displayed]

FINDINGS: CT HEAD

Brain: There is no acute intracranial hemorrhage, mass effect, or
edema. No new loss of gray-white differentiation. There is no
extra-axial fluid collection. Patchy and confluent areas of
hypoattenuation in the supratentorial white matter likely reflects
stable chronic microvascular ischemic changes. Chronic small vessel
infarct of the left caudate. Ventricles and sulci are stable in size
and configuration.

Vascular: There is atherosclerotic calcification at the skull base.

Skull: Calvarium is unremarkable.

Sinuses/Orbits: No acute finding.

Other: None.

Review of the MIP images confirms the above findings

CTA NECK

Aortic arch: Moderate calcified and noncalcified plaque along the
arch and at the great vessel origins, which are patent. No
high-grade subclavian artery stenosis.

Right carotid system: Patent. Mixed plaque along the common carotid
causing less than 50% stenosis. Mixed plaque along the proximal
internal carotid causing up to 60% stenosis.

Left carotid system: Patent. Mixed plaque along the common carotid
causing less than 50% stenosis. Mixed plaque along the proximal
internal carotid causing less than 50% stenosis.

Vertebral arteries: Patent. Right vertebral artery is dominant.
Multifocal irregularity and stenosis the left including severe
stenosis at the V1-V2 junction.

Skeleton: Degenerative changes of the included spine.

Other neck: No mass or adenopathy.

Upper chest: Emphysema.

Review of the MIP images confirms the above findings

CTA HEAD

Anterior circulation: Intracranial internal carotid arteries are
patent with primarily calcified plaque resulting in up to moderate
stenosis. Anterior and middle cerebral arteries are patent.

Posterior circulation: The intracranial vertebral arteries are
patent. There is mild plaque on the right. There is high-grade
stenosis or occlusion of the mid left vertebral artery just beyond
the PICA origin. Reconstitution distally. Basilar artery is patent.
Posterior cerebral arteries are patent.

Venous sinuses: Patent as allowed by contrast bolus timing.

Review of the MIP images confirms the above findings
IMPRESSION: No acute intracranial abnormality.

Plaque along the proximal right internal carotid causes up to 60%
stenosis. Plaque along the proximal left internal carotid causes
less than 50% stenosis. Multifocal left vertebral artery stenosis
including severe stenosis at the V1-V2 junction.

Up to moderate stenosis of the intracranial intra carotid arteries.
High-grade stenosis or occlusion of the mid left intracranial
vertebral artery just beyond the PICA origin with distal
reconstitution.

## 2020-01-08 MED ORDER — STROKE: EARLY STAGES OF RECOVERY BOOK
Freq: Once | Status: DC
  Filled 2020-01-08: qty 1

## 2020-01-08 MED ORDER — SENNOSIDES-DOCUSATE SODIUM 8.6-50 MG PO TABS: 1.0000 | ORAL_TABLET | Freq: Every evening | ORAL | Status: DC | PRN

## 2020-01-08 MED ORDER — DIAZEPAM 5 MG PO TABS: 10.0000 mg | ORAL_TABLET | Freq: Every day | ORAL | Status: DC | PRN

## 2020-01-08 MED ORDER — ACETAMINOPHEN 160 MG/5ML PO SOLN: 650.0000 mg | ORAL | Status: DC | PRN

## 2020-01-08 MED ORDER — SODIUM CHLORIDE (PF) 0.9 % IJ SOLN
INTRAMUSCULAR | Status: AC
  Filled 2020-01-08: qty 50

## 2020-01-08 MED ORDER — RIVAROXABAN 20 MG PO TABS
20.0000 mg | ORAL_TABLET | Freq: Every day | ORAL | Status: DC
  Filled 2020-01-08: qty 1

## 2020-01-08 MED ORDER — STROKE: EARLY STAGES OF RECOVERY BOOK: Freq: Once | Status: AC

## 2020-01-08 MED ORDER — TRAMADOL HCL 50 MG PO TABS
50.0000 mg | ORAL_TABLET | Freq: Once | ORAL | Status: DC
  Filled 2020-01-08: qty 1

## 2020-01-08 MED ORDER — MELATONIN 3 MG PO TABS: 3.0000 mg | ORAL_TABLET | Freq: Every evening | ORAL | Status: DC | PRN

## 2020-01-08 MED ORDER — TRAMADOL HCL 50 MG PO TABS
50.0000 mg | ORAL_TABLET | Freq: Four times a day (QID) | ORAL | Status: DC | PRN
  Administered 2020-01-10: 50 mg via ORAL
  Filled 2020-01-08: qty 1

## 2020-01-08 MED ORDER — IOHEXOL 350 MG/ML SOLN
100.0000 mL | Freq: Once | INTRAVENOUS | Status: AC | PRN
  Administered 2020-01-08: 75 mL via INTRAVENOUS

## 2020-01-08 MED ORDER — RIVAROXABAN 20 MG PO TABS
20.0000 mg | ORAL_TABLET | Freq: Every day | ORAL | Status: DC
  Administered 2020-01-08 – 2020-01-12 (×5): 20 mg via ORAL
  Filled 2020-01-08 (×6): qty 1

## 2020-01-08 MED ORDER — FLUOXETINE HCL 10 MG PO CAPS
10.0000 mg | ORAL_CAPSULE | Freq: Every day | ORAL | Status: DC
  Administered 2020-01-08 – 2020-01-12 (×4): 10 mg via ORAL
  Filled 2020-01-08 (×4): qty 1

## 2020-01-08 MED ORDER — ACETAMINOPHEN 650 MG RE SUPP: 650.0000 mg | RECTAL | Status: DC | PRN

## 2020-01-08 MED ORDER — PRAVASTATIN SODIUM 40 MG PO TABS
40.0000 mg | ORAL_TABLET | Freq: Every day | ORAL | Status: DC
  Administered 2020-01-08 – 2020-01-12 (×4): 40 mg via ORAL
  Filled 2020-01-08: qty 1
  Filled 2020-01-08: qty 2
  Filled 2020-01-08 (×2): qty 1

## 2020-01-08 MED ORDER — OXYCODONE-ACETAMINOPHEN 5-325 MG PO TABS
1.0000 | ORAL_TABLET | Freq: Four times a day (QID) | ORAL | Status: DC | PRN
  Administered 2020-01-08 (×2): 1 via ORAL
  Filled 2020-01-08 (×2): qty 1

## 2020-01-08 MED ORDER — ACETAMINOPHEN 325 MG PO TABS
650.0000 mg | ORAL_TABLET | ORAL | Status: DC | PRN
  Filled 2020-01-08: qty 2

## 2020-01-08 MED ORDER — RIVAROXABAN 20 MG PO TABS: 20.0000 mg | ORAL_TABLET | Freq: Every day | ORAL | Status: DC

## 2020-01-08 NOTE — ED Notes (Signed)
Purple DNR sticker applied on patient armband.

## 2020-01-08 NOTE — ED Notes (Signed)
Handoff report given to [facility] on 2W. [facility] has been called.

## 2020-01-08 NOTE — ED Notes (Signed)
Pt received sandwich and Cola at this time.

## 2020-01-08 NOTE — ED Notes (Addendum)
Attempted to call report twice both charge and nurse assigned to the patient were unavailable  to take report 7:37pm at that time.

## 2020-01-08 NOTE — H&P (Signed)
History and Physical    Michael Ware:[id number] DOB: Jun 11, 1931 DOA: 01/07/2020  PCP: Alroy Dust, L.Marlou Sa, MD  Patient coming from: Home  Chief Complaint: Leg weakness, confusion.   HPI: Michael Ware is a 86 y.o. male with medical history significant of bradycardia, HTN, HLD. Presenting with BLE weakness, confusion, and right facial droop.  History per daughter. Has been working with PT at home for several weeks and was doing well. In normal state of health until Monday morning. On Monday morning he seemed to have a harder time with his exercises but generally ok. Tuesday morning, he called for help to get out of bed and his legs "were like noodles". Daughter helped him to the bathroom and back. When she got him to the bed, she noticed that he didn't have the strength to get back into the bed. PT noticed that he was weaker that day. The weakness progressed over the next day. They spoke with his PCP who recommended that he go to walk-in clinic or ED yesterday. The daughter noted that he was having a til to the left. They went to walk-in clinic and they recommended an MRI. They went home and the daughter noticed that he was having difficulty writing and he was more confused. She did what she knew for a stroke test: asked him to speak and smile. She said his speech seemed slurred but his smilye was symmetrical. She became concerned and had him transported to the ED.    ED Course: Teleneurology was consulted. CTH, MRI brain obtained. No acute finding. MRI thoracic/lumbar spine obtained; no acute finding. TRH was called for admission.   Review of Systems:  Denies CP, palpitations, dyspnea, N/V, syncopal episodes, seizure like activity. Review of systems is otherwise negative for all not mentioned in HPI.   PMHx Past Medical History:  Diagnosis Date  . Acute GI bleeding 01/19/13   most likely due to diverticulosis  . BPH (benign prostatic hypertrophy) with urinary obstruction   . Bradycardia   .  Diverticulosis   . Elevated hemoglobin A1c   . Hyperglycemia   . Hyperlipidemia   . Hypertension   . Internal hemorrhoids with bleeding and fecal soiling 11/19/2014  . Rectal bleed 01/19/13  . Vitamin D deficiency     PSHx Past Surgical History:  Procedure Laterality Date  . ANAL FISSURE REPAIR  1980  . HEMORRHOID BANDING    . ORIF HUMERUS FRACTURE Right 11/07/2019   Procedure: OPEN REDUCTION INTERNAL FIXATION (ORIF) DISTAL HUMERUS FRACTURE;  Surgeon: Justice Britain, MD;  Location: [facility];  Service: Orthopedics;  Laterality: Right;  . rectal fissure procedure  1970's  . ROTATOR CUFF REPAIR     bilateral  . TONSILLECTOMY      [facility]  reports that he quit smoking about 21 years ago. He has quit using smokeless tobacco. He reports current alcohol use of about 2.0 - 3.0 standard drinks of alcohol per week. He reports that he does not use drugs.  Allergies  Allergen Reactions  . Atenolol Other (See Comments)    Caused heart rate to drop Other reaction(s): Other Caused heart rate to drop  . Morphine And Related Other (See Comments)    hallucinations  . Trazamine [Trazodone & Diet Manage Prod] Other (See Comments)    Dry mouth    FamHx Family History  Problem Relation Age of Onset  . Heart disease Mother   . Heart disease Father   . Colon cancer Neg Hx  Prior to Admission medications   Medication Sig Start Date End Date Taking? Authorizing Provider  diazepam (VALIUM) 10 MG tablet Take 10 mg by mouth daily as needed for sleep.  11/26/19  Yes [provider]  FLUoxetine (PROZAC) 10 MG capsule Take 10 mg by mouth daily. 10/23/19  Yes [provider]  MELATONIN PO Take 1 tablet by mouth at bedtime as needed (sleep).   Yes [provider]  pravastatin (PRAVACHOL) 40 MG tablet Take 40 mg by mouth daily.    Yes [provider]  rivaroxaban (XARELTO) 20 MG TABS tablet Take 20 mg by mouth daily.   Yes [provider]  traMADol (ULTRAM)  50 MG tablet Take 50 mg by mouth every 6 (six) hours as needed for moderate pain.   Yes [provider]  RIVAROXABAN Alveda Reasons) VTE STARTER PACK (15 & 20 MG TABLETS) Follow package directions: Take one 15mg  tablet by mouth twice a day. On day 22, switch to one 20mg  tablet once a day. Take with food. Patient not taking: Reported on 01/08/2020 11/14/19   Dessa Phi, DO    Physical Exam: Vitals:   01/08/20 0030 01/08/20 0130 01/08/20 0230 01/08/20 0500  BP: 135/71 101/77 (!) 139/124 (!) 133/109  Pulse: 69 63 70 66  Resp: 12 14 15 19   Temp:      TempSrc:      SpO2: (!) 87% 100% 99% 99%    General: 86 y.o. male resting in bed in NAD Eyes: PERRL, normal sclera ENMT: Nares patent w/o discharge, orophaynx clear, dentition normal, ears w/o discharge/lesions/ulcers Neck: Supple, trachea midline Cardiovascular: RRR, +S1, S2, no m/g/r, equal pulses throughout Respiratory: CTABL, no w/r/r, normal WOB GI: BS+, NDNT, no masses noted, no organomegaly noted MSK: No e/c/c Skin: No rashes, bruises, ulcerations noted Neuro: A&O x 3, right facial droop, BUE weakness 4/5 str w/ weak handgrip. BLE weakness (R > L) 4/5 str Psyc: Appropriate interaction and affect, calm/cooperative  Labs on Admission: I have personally reviewed following labs and imaging studies  CBC: Recent Labs  Lab 01/07/20 2153  WBC 6.8  NEUTROABS 4.1  HGB 14.4  HCT 42.8  MCV 105.2*  PLT 595   Basic Metabolic Panel: Recent Labs  Lab 01/07/20 2153  NA 139  K 3.8  CL 101  CO2 28  GLUCOSE 100*  BUN 12  CREATININE 0.97  CALCIUM 9.0   GFR: CrCl cannot be calculated (Unknown ideal weight.). Liver Function Tests: Recent Labs  Lab 01/07/20 2153  AST 34  ALT 27  ALKPHOS 94  BILITOT 0.7  PROT 6.5  ALBUMIN 3.6   No results for input(s): LIPASE, AMYLASE in the last 168 hours. No results for input(s): AMMONIA in the last 168 hours. Coagulation Profile: No results for input(s): INR, PROTIME in the last  168 hours. Cardiac Enzymes: No results for input(s): CKTOTAL, CKMB, CKMBINDEX, TROPONINI in the last 168 hours. BNP (last 3 results) No results for input(s): PROBNP in the last 8760 hours. HbA1C: No results for input(s): HGBA1C in the last 72 hours. CBG: No results for input(s): GLUCAP in the last 168 hours. Lipid Profile: No results for input(s): CHOL, HDL, LDLCALC, TRIG, CHOLHDL, LDLDIRECT in the last 72 hours. Thyroid Function Tests: No results for input(s): TSH, T4TOTAL, FREET4, T3FREE, THYROIDAB in the last 72 hours. Anemia Panel: No results for input(s): VITAMINB12, FOLATE, FERRITIN, TIBC, IRON, RETICCTPCT in the last 72 hours. Urine analysis:    Component Value Date/Time   COLORURINE AMBER (A) 01/08/2020  0030   APPEARANCEUR HAZY (A) 01/08/2020 0030   LABSPEC 1.016 01/08/2020 0030   PHURINE 5.0 01/08/2020 0030   GLUCOSEU NEGATIVE 01/08/2020 0030   HGBUR NEGATIVE 01/08/2020 0030   BILIRUBINUR NEGATIVE 01/08/2020 0030   KETONESUR 5 (A) 01/08/2020 0030   PROTEINUR NEGATIVE 01/08/2020 0030   NITRITE NEGATIVE 01/08/2020 0030   LEUKOCYTESUR NEGATIVE 01/08/2020 0030    Radiological Exams on Admission: CT Head Wo Contrast  Result Date: 01/07/2020 CLINICAL DATA:  Daughter is in town to take care of patient. States weakness started on Sunday. Multiple episodes when she has had to lower him to the floor. Pt denies fall or injury. EMS has been to the house multiple time to assist with lifting patient from the floor. Pt is leaning to the left side. Equal grip strength bilaterally. No facial droop or altered sensation. " EXAM: CT HEAD WITHOUT CONTRAST TECHNIQUE: Contiguous axial images were obtained from the base of the skull through the vertex without intravenous contrast. COMPARISON:  11/12/2019 FINDINGS: Brain: No evidence of acute infarction, hemorrhage, hydrocephalus, extra-axial collection or mass lesion/mass effect. There is ventricular sulcal enlargement reflecting mild to moderate  diffuse atrophy. Patchy bilateral white matter hypoattenuation is also noted consistent with moderate chronic microvascular ischemic change. These findings are stable. Vascular: No hyperdense vessel or unexpected calcification. Skull: Normal. Negative for fracture or focal lesion. Sinuses/Orbits: Globes and orbits are unremarkable. Sinuses and mastoid air cells are clear. Other: None. IMPRESSION: 1. No acute intracranial abnormalities. 2. Atrophy and chronic microvascular ischemic change. Stable appearance from the prior head CT. Electronically Signed   By: Lajean Manes M.D.   On: 01/07/2020 20:51   MR BRAIN WO CONTRAST  Result Date: 01/08/2020 CLINICAL DATA:  Confusion and worsening right lower extremity weakness with ataxia. EXAM: MRI HEAD WITHOUT CONTRAST TECHNIQUE: Multiplanar, multiecho pulse sequences of the brain and surrounding structures were obtained without intravenous contrast. COMPARISON:  Head CT 01/07/2020 FINDINGS: Brain: No acute infarction, hemorrhage, hydrocephalus, extra-axial collection or mass lesion. Chronic small vessel ischemia in the periventricular white matter, mild for age. Cerebral volume loss which is also generalized and mild for age. Vascular: Normal flow voids Skull and upper cervical spine: Normal marrow signal Sinuses/Orbits: Negative IMPRESSION: Senescent changes without acute or reversible finding. Electronically Signed   By: Monte Fantasia M.D.   On: 01/08/2020 06:30   MR THORACIC SPINE WO CONTRAST  Result Date: 01/08/2020 CLINICAL DATA:  Cord compression, a tracks E a. Nontraumatic. Worsening right lower extremity weakness and ataxia. EXAM: MRI THORACIC SPINE WITHOUT CONTRAST TECHNIQUE: Multiplanar, multisequence MR imaging of the thoracic spine was performed. No intravenous contrast was administered. COMPARISON:  Chest CT from 11/10/2019 FINDINGS: Alignment:  Negative for listhesis. Vertebrae: No fracture, evidence of discitis, or bone lesion. Cord:  Normal signal  and morphology. Paraspinal and other soft tissues: Marked fatty atrophy of intrinsic back muscles throughout the thoracic spine. Disc levels: Generalized disc desiccation and narrowing. Spondylitic spurring throughout the mid to lower thoracic spine. Negative facets. No neural compression. IMPRESSION: 1. No acute finding.  Negative MRI of the thoracic cord. 2. Generalized thoracic spondylosis. 3. Prominent axial muscular atrophy. Electronically Signed   By: Monte Fantasia M.D.   On: 01/08/2020 07:06   MR LUMBAR SPINE WO CONTRAST  Result Date: 01/08/2020 CLINICAL DATA:  Lumbar radiculopathy. Worse on right lower extremity weakness and a core slight EXAM: MRI LUMBAR SPINE WITHOUT CONTRAST TECHNIQUE: Multiplanar, multisequence MR imaging of the lumbar spine was performed. No intravenous contrast was administered. COMPARISON:  Lumbar myelogram 8 chorion FINDINGS: Segmentation:  Transitional S1 vertebra. Alignment:  Levoscoliosis and slight L5-S1 retrolisthesis Vertebrae:  No fracture, evidence of discitis, or bone lesion. Conus medullaris and cauda equina: Conus extends to the T12-L1 level. Conus and cauda equina appear normal. Paraspinal and other soft tissues: Prominent fatty atrophy of intrinsic back muscles CT Disc levels: L1-L2: Mild spondylosis with bridging osteophyte. L2-L3: Spondylosis.  No impingement L3-L4: Spondylosis with ventral spurring.  No neural impingement L4-L5: . For disc narrowing back bulky left far-lateral marginal spurring. No neural compression. L5-S1:Disc narrowing with bulky left far-lateral spurring. The annulus is fissured posteriorly and left lateral. Asymmetric left facet spurring. Noncompressive left foraminal narrowing. S1-2: Asymmetric left facet spurring. Left far-lateral spurring. No neural compression. IMPRESSION: 1. No acute finding. No neural compression on the symptomatic right side. 2. Spondylosis and degenerative disease with scoliosis that is very similar to 2014. 3.  Prominent fatty atrophy of intrinsic back muscles. 4. Transitional S1 vertebra. Electronically Signed   By: Monte Fantasia M.D.   On: 01/08/2020 07:22    EKG: Independently reviewed. Sinus, no st elevation  Assessment/Plan TIA vs Stroke     - admit to [facility] obs, tele     - neurology consulted     - CTH, MRI brain negative thus far     - CTA H&N ordered     - echo, A1c, lipid profile     - continue xarelto, statin per neuro rec     - permissive HTN for now     - PT/OT/SLP  BLE weakness     - stroke w/u as above     - MRI thoracic, lumbar spine are negative for acute nerve impingement, stenosis     - he does have chronic DDD     - PT/OT  HLD     - statin  Anxiety     - prozac  Macrocytosis     - check B12, folate, thiamine  DVT prophylaxis: lovenox  Code Status: DNR, confirmed at bedside  Family Communication: Spoke with dtr by phone Michael Ware) Consults called: Neurology (Dr. Rory Percy)  Status is: Observation  The patient remains OBS appropriate and will d/c before 2 midnights.  Dispo: The patient is from: Home              Anticipated d/c is to: SNF              Anticipated d/c date is: 1 day              Patient currently is not medically stable to d/c.  Jonnie Finner DO [facility]  If 7PM-7AM, please contact night-coverage [email or web address]  01/08/2020, 7:39 AM

## 2020-01-08 NOTE — Consult Note (Signed)
[facility] TeleNeurology Consult Services  Stat Consult  Date of Service:   01/08/2020 03:11:06  Impression:     .  I63.30 - Cerebrovascular accident (CVA) due to thrombosis of cerebral artery ([place])  Comments/Sign-Out: Pt presents with confusion, worsening RLE weakness and ataxia. He is outside the time window for acute intervention. WOuld recommend admission for MRI brain to evaluate in addition to assessment of toxic/metabolic factors that may be contributing to cognitive change.  CT HEAD: Showed No Acute Hemorrhage or Acute Core Infarct  Our recommendations are outlined below.  Diagnostic Studies: Recommend MRI brain without contrast  Laboratory Studies: Recommend Lipid panel Hemoglobin A1c TSH  Nursing Recommendations: Telemetry, IV Fluids, avoid dextrose containing fluids, Maintain euglycemia Neuro checks q4 hrs x 24 hrs and then per shift Head of bed 30 degrees  Consultations: Recommend Speech therapy if failed dysphagia screen Physical therapy/Occupational therapy  DVT Prophylaxis: Choice of Primary Team  Disposition: Neurology will follow  Additional Recommendations:     Imaging: reviewed  Labs: reviewed  Metrics: [facility] Notification Time: 01/08/2020 03:09:01 Stamp Time: 01/08/2020 03:11:06 Callback Response Time: 01/08/2020 03:12:10   ----------------------------------------------------------------------------------------------------  Chief Complaint: RLE weakness  History of Present Illness: Patient is a 86 year old Male.  Pt reports he has weakness in b/l LE and pain in the L hip. He is s/p repair of humeral fracture and just returned home from rehab. Pt reports he tried to get to the bathroom and couldn't get up and had to call EMS. Pt reports that he has chronic RLE weakness, he has been seeing physicians for a long time for this. But today it's gotten much worse. Pt's daughter reported associated confusion.  Pt denies any bowel or bladder issues. No sensory changes that are new either. Pt denies associated bulbar deficits. Pt denies any involvement of the upper extremities. Of note, pt cannot move his RUE due to pain from fracture.    Past Medical History:     . Hyperlipidemia     . There is NO history of Hypertension     . There is NO history of Diabetes Mellitus     . There is NO history of Atrial Fibrillation     . There is NO history of Coronary Artery Disease     . There is NO history of Stroke     . There is NO history of Covid-19  Anticoagulant use:  Yes xarelto for PE  Antiplatelet use: No    Examination: BP(162/61), Pulse(70), Blood Glucose(100) 1A: Level of Consciousness - Alert; keenly responsive + 0 1B: Ask Month and Age - Both Questions Right + 0 1C: Blink Eyes & Squeeze Hands - Performs Both Tasks + 0 2: Test Horizontal Extraocular Movements - Normal + 0 3: Test Visual Fields - No Visual Loss + 0 4: Test Facial Palsy (Use Grimace if Obtunded) - Normal symmetry + 0 5A: Test Left Arm Motor Drift - Drift, but doesn't hit bed + 1 5B: Test Right Arm Motor Drift - No Effort Against Gravity + 3 6A: Test Left Leg Motor Drift - Drift, hits bed + 2 6B: Test Right Leg Motor Drift - No Effort Against Gravity + 3 7: Test Limb Ataxia (FNF/Heel-Shin) - Ataxia in 1 Limb + 1 8: Test Sensation - Normal; No sensory loss + 0 9: Test Language/Aphasia - Normal; No aphasia + 0 10: Test Dysarthria - Mild-Moderate Dysarthria: Slurring but can be understood + 1 11: Test Extinction/Inattention - No abnormality + 0  NIHSS Score: 11   Patient / Family was informed the Neurology Consult would occur via TeleHealth consult by way of interactive audio and video telecommunications and consented to receiving care in this manner.  Patient is being evaluated for possible acute neurologic impairment and high probability of imminent or life - threatening deterioration.I spent total of 35 minutes providing  care to this patient, including time for face to face visit via telemedicine, review of medical records, imaging studies and discussion of findings with providers, the patient and / or family.   Dr Elzie Rings   [facility] [phone]  Case [id number]

## 2020-01-09 ENCOUNTER — Inpatient Hospital Stay (HOSPITAL_COMMUNITY): Payer: Medicare Other

## 2020-01-09 ENCOUNTER — Observation Stay (HOSPITAL_COMMUNITY): Payer: Medicare Other

## 2020-01-09 DIAGNOSIS — L89159 Pressure ulcer of sacral region, unspecified stage: Secondary | ICD-10-CM | POA: Diagnosis present

## 2020-01-09 DIAGNOSIS — Z885 Allergy status to narcotic agent status: Secondary | ICD-10-CM | POA: Diagnosis not present

## 2020-01-09 DIAGNOSIS — R29810 Facial weakness: Secondary | ICD-10-CM | POA: Diagnosis present

## 2020-01-09 DIAGNOSIS — Z8249 Family history of ischemic heart disease and other diseases of the circulatory system: Secondary | ICD-10-CM | POA: Diagnosis not present

## 2020-01-09 DIAGNOSIS — R059 Cough, unspecified: Secondary | ICD-10-CM | POA: Diagnosis not present

## 2020-01-09 DIAGNOSIS — G9341 Metabolic encephalopathy: Secondary | ICD-10-CM | POA: Diagnosis present

## 2020-01-09 DIAGNOSIS — Z86718 Personal history of other venous thrombosis and embolism: Secondary | ICD-10-CM | POA: Diagnosis not present

## 2020-01-09 DIAGNOSIS — Z86711 Personal history of pulmonary embolism: Secondary | ICD-10-CM | POA: Diagnosis not present

## 2020-01-09 DIAGNOSIS — F419 Anxiety disorder, unspecified: Secondary | ICD-10-CM | POA: Diagnosis present

## 2020-01-09 DIAGNOSIS — J9601 Acute respiratory failure with hypoxia: Secondary | ICD-10-CM | POA: Diagnosis present

## 2020-01-09 DIAGNOSIS — J90 Pleural effusion, not elsewhere classified: Secondary | ICD-10-CM | POA: Diagnosis not present

## 2020-01-09 DIAGNOSIS — J690 Pneumonitis due to inhalation of food and vomit: Secondary | ICD-10-CM | POA: Diagnosis present

## 2020-01-09 DIAGNOSIS — Z8673 Personal history of transient ischemic attack (TIA), and cerebral infarction without residual deficits: Secondary | ICD-10-CM | POA: Diagnosis not present

## 2020-01-09 DIAGNOSIS — E559 Vitamin D deficiency, unspecified: Secondary | ICD-10-CM | POA: Diagnosis present

## 2020-01-09 DIAGNOSIS — R4781 Slurred speech: Secondary | ICD-10-CM | POA: Diagnosis present

## 2020-01-09 DIAGNOSIS — E782 Mixed hyperlipidemia: Secondary | ICD-10-CM | POA: Diagnosis present

## 2020-01-09 DIAGNOSIS — M2578 Osteophyte, vertebrae: Secondary | ICD-10-CM | POA: Diagnosis not present

## 2020-01-09 DIAGNOSIS — J440 Chronic obstructive pulmonary disease with acute lower respiratory infection: Secondary | ICD-10-CM | POA: Diagnosis present

## 2020-01-09 DIAGNOSIS — F05 Delirium due to known physiological condition: Secondary | ICD-10-CM | POA: Diagnosis not present

## 2020-01-09 DIAGNOSIS — K5790 Diverticulosis of intestine, part unspecified, without perforation or abscess without bleeding: Secondary | ICD-10-CM | POA: Diagnosis present

## 2020-01-09 DIAGNOSIS — R29898 Other symptoms and signs involving the musculoskeletal system: Secondary | ICD-10-CM | POA: Diagnosis not present

## 2020-01-09 DIAGNOSIS — R531 Weakness: Secondary | ICD-10-CM | POA: Diagnosis not present

## 2020-01-09 DIAGNOSIS — I63312 Cerebral infarction due to thrombosis of left middle cerebral artery: Secondary | ICD-10-CM | POA: Diagnosis not present

## 2020-01-09 DIAGNOSIS — M50223 Other cervical disc displacement at C6-C7 level: Secondary | ICD-10-CM | POA: Diagnosis not present

## 2020-01-09 DIAGNOSIS — Z66 Do not resuscitate: Secondary | ICD-10-CM | POA: Diagnosis present

## 2020-01-09 DIAGNOSIS — R1310 Dysphagia, unspecified: Secondary | ICD-10-CM | POA: Diagnosis present

## 2020-01-09 DIAGNOSIS — N401 Enlarged prostate with lower urinary tract symptoms: Secondary | ICD-10-CM | POA: Diagnosis present

## 2020-01-09 DIAGNOSIS — E785 Hyperlipidemia, unspecified: Secondary | ICD-10-CM | POA: Diagnosis present

## 2020-01-09 DIAGNOSIS — G459 Transient cerebral ischemic attack, unspecified: Secondary | ICD-10-CM | POA: Diagnosis not present

## 2020-01-09 DIAGNOSIS — Z20822 Contact with and (suspected) exposure to covid-19: Secondary | ICD-10-CM | POA: Diagnosis present

## 2020-01-09 DIAGNOSIS — I10 Essential (primary) hypertension: Secondary | ICD-10-CM | POA: Diagnosis present

## 2020-01-09 DIAGNOSIS — Z7901 Long term (current) use of anticoagulants: Secondary | ICD-10-CM | POA: Diagnosis not present

## 2020-01-09 DIAGNOSIS — M4802 Spinal stenosis, cervical region: Secondary | ICD-10-CM | POA: Diagnosis not present

## 2020-01-09 DIAGNOSIS — Z87891 Personal history of nicotine dependence: Secondary | ICD-10-CM | POA: Diagnosis not present

## 2020-01-09 DIAGNOSIS — I6502 Occlusion and stenosis of left vertebral artery: Secondary | ICD-10-CM | POA: Diagnosis not present

## 2020-01-09 LAB — HEMOGLOBIN A1C
Hgb A1c MFr Bld: 4.9 % (ref 4.8–5.6)
Mean Plasma Glucose: 93.93 mg/dL

## 2020-01-09 LAB — GLUCOSE, CAPILLARY: Glucose-Capillary: 133 mg/dL — ABNORMAL HIGH (ref 70–99)

## 2020-01-09 IMAGING — MR MR HEAD W/O CM
4 series · 48 of 48 positions shown · non-contrast
Comparison: None.

CLINICAL DATA: Stroke follow-up

EXAM:
MRI HEAD WITHOUT CONTRAST
TECHNIQUE: Multiplanar, multiecho pulse sequences of the brain and surrounding
structures were obtained without intravenous contrast.

[Series 3: DWI · axial · 3.0mm · 0.94mm/px · z∈[-68,+91]mm · 18 of 108 slices shown (1 of 2)]
[im 1/108]
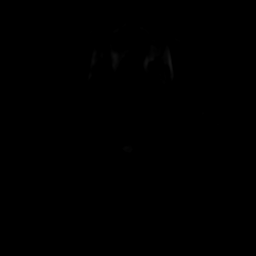
[im 7/108]
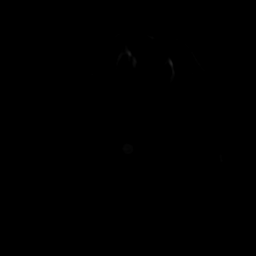
[im 13/108]
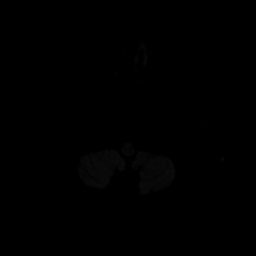
[im 19/108]
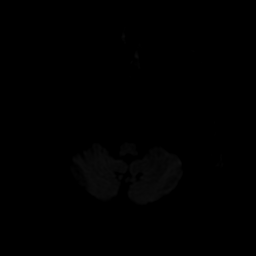
[im 26/108]
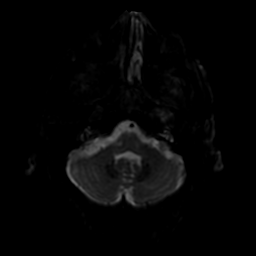
[im 32/108]
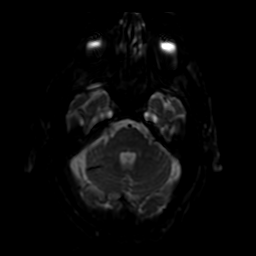
[im 38/108]
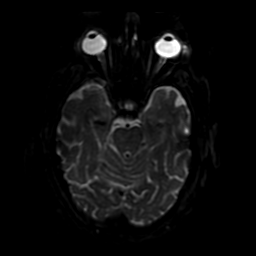
[im 45/108]
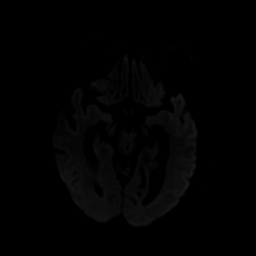
[im 51/108]
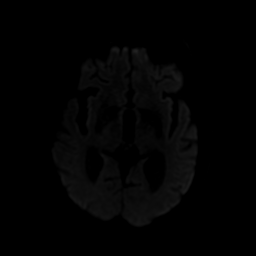
[im 57/108]
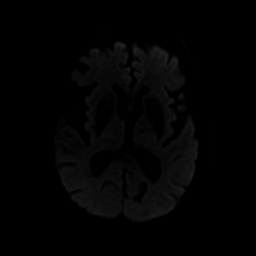
[im 63/108]
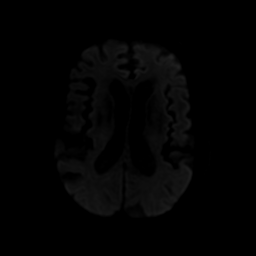
[im 70/108]
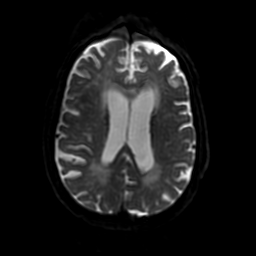
[im 76/108]
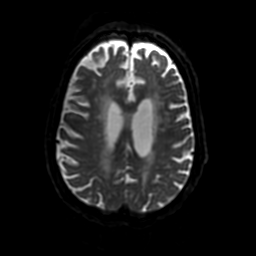
[im 82/108]
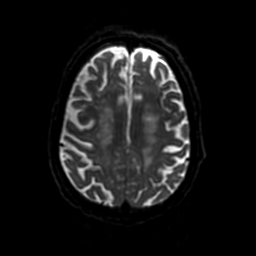
[im 89/108]
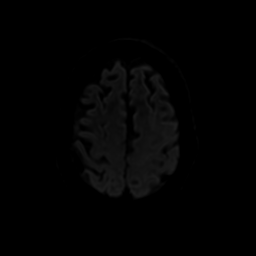
[im 95/108]
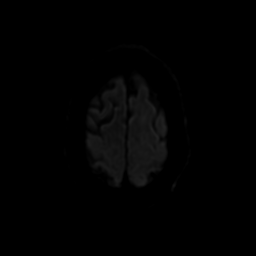
[im 101/108]
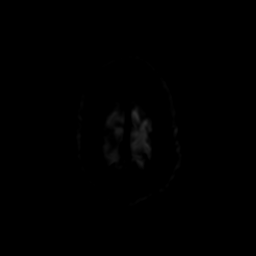
[im 108/108]
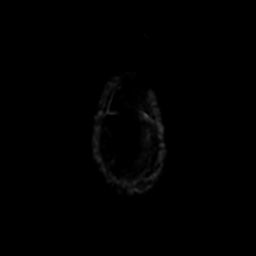

[Series 4: DWI · coronal · 4.0mm · 0.94mm/px · 14 of 80 slices shown (2 of 2)]
[im 1/80]
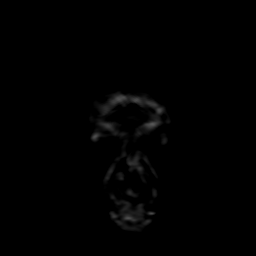
[im 7/80]
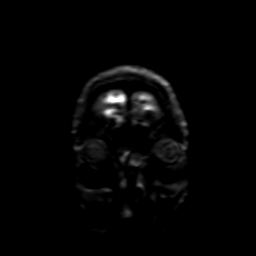
[im 13/80]
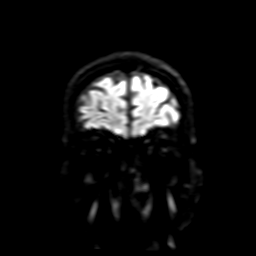
[im 19/80]
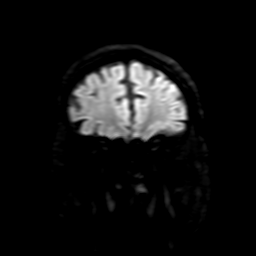
[im 25/80]
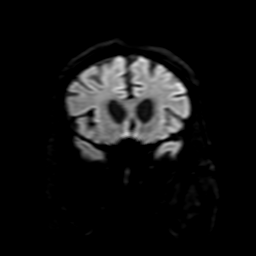
[im 31/80]
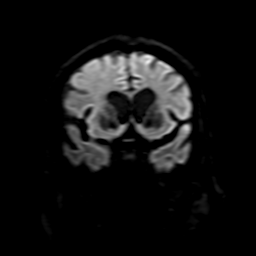
[im 37/80]
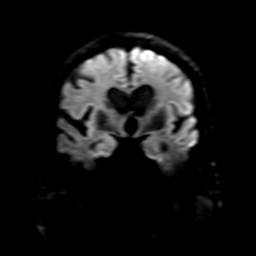
[im 43/80]
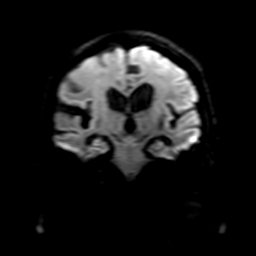
[im 49/80]
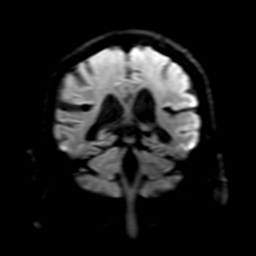
[im 55/80]
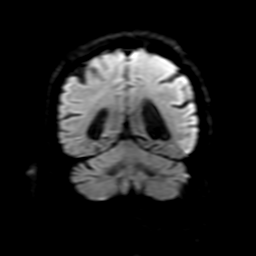
[im 61/80]
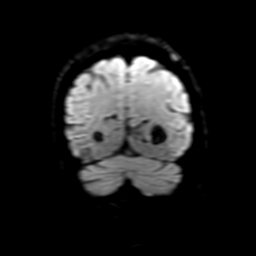
[im 67/80]
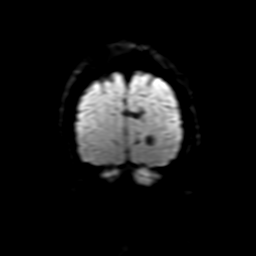
[im 73/80]
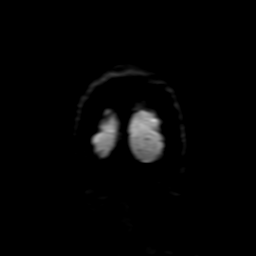
[im 80/80]
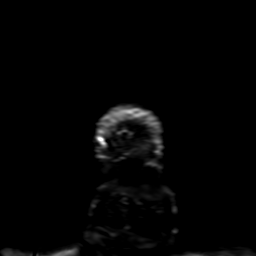

[Series 350: ADC · axial · 3.0mm · 0.94mm/px · z∈[-68,+91]mm · 9 of 54 slices shown (1 of 2)]
[im 1/54]
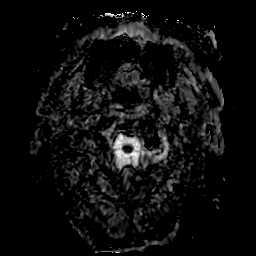
[im 7/54]
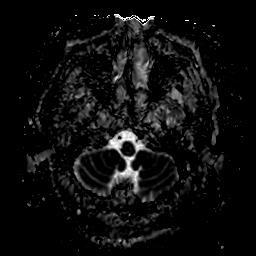
[im 14/54]
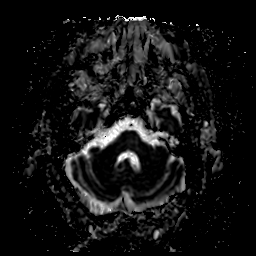
[im 20/54]
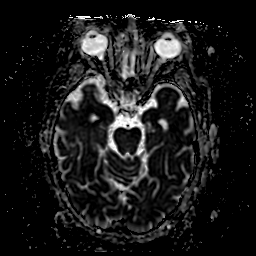
[im 27/54]
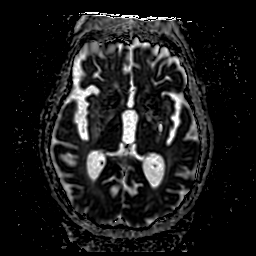
[im 34/54]
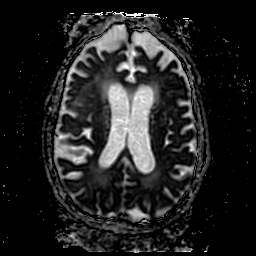
[im 40/54]
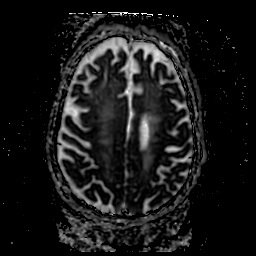
[im 47/54]
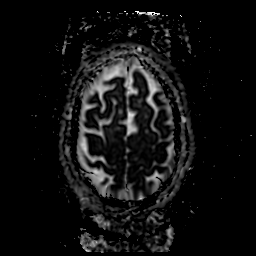
[im 54/54]
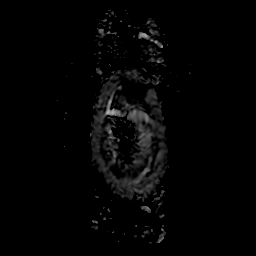

[Series 450: ADC · coronal · 4.0mm · 0.94mm/px · 7 of 40 slices shown (2 of 2)]
[im 1/40]
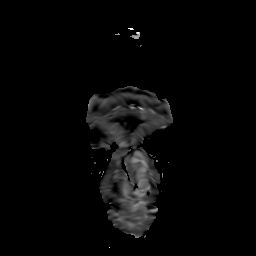
[im 7/40]
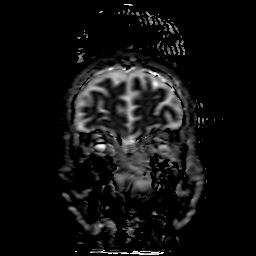
[im 14/40]
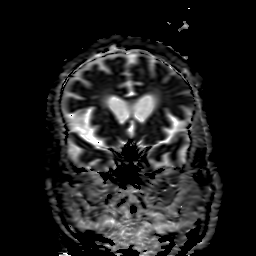
[im 20/40]
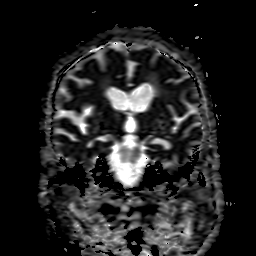
[im 27/40]
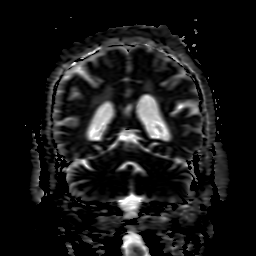
[im 33/40]
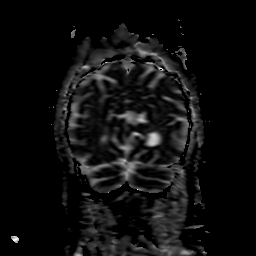
[im 40/40]
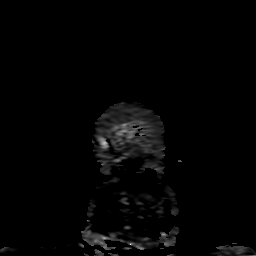

[48 of 48 positions shown; findings below may reference images not displayed]

FINDINGS: Diffusion-weighted imaging shows no acute infarct. There is no
midline shift or other mass generalized volume loss.
IMPRESSION: No acute infarct.

## 2020-01-09 IMAGING — MR MR CERVICAL SPINE W/O CM
5 of 6 series · 24 of 48 positions shown · non-contrast
Comparison: None.

CLINICAL DATA: Cervical osteoarthritis

EXAM:
MRI CERVICAL SPINE WITHOUT CONTRAST
TECHNIQUE: Multiplanar, multisequence MR imaging of the cervical spine was
performed. No intravenous contrast was administered.

[Series 6: T2 · sagittal · 3.0mm · 0.43mm/px · 3 of 16 slices shown (1 of 2)]
[im 1/16]
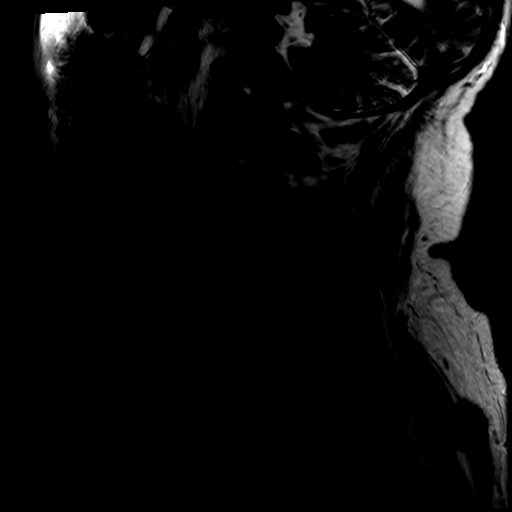
[im 8/16]
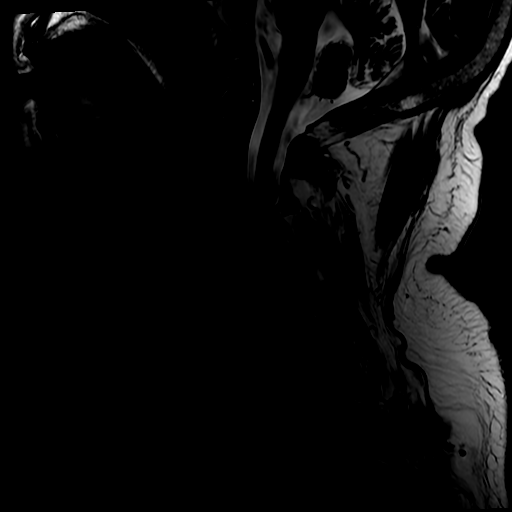
[im 16/16]
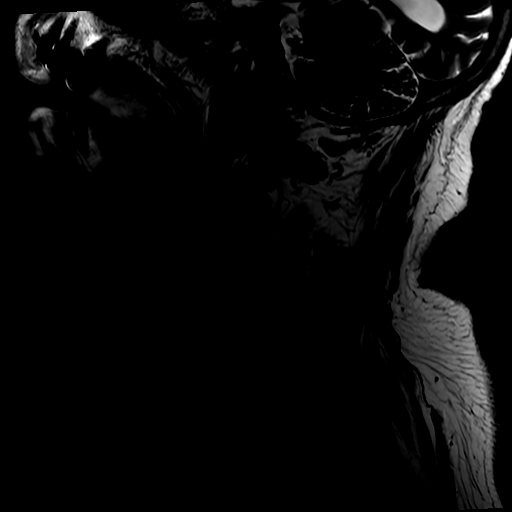

[Series 7: FLAIR · sagittal · 3.0mm · 0.43mm/px · 2 of 16 slices shown]
[im 1/16]
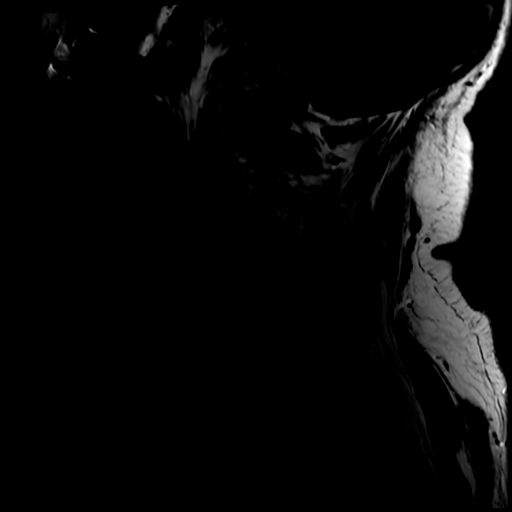
[im 6/16]
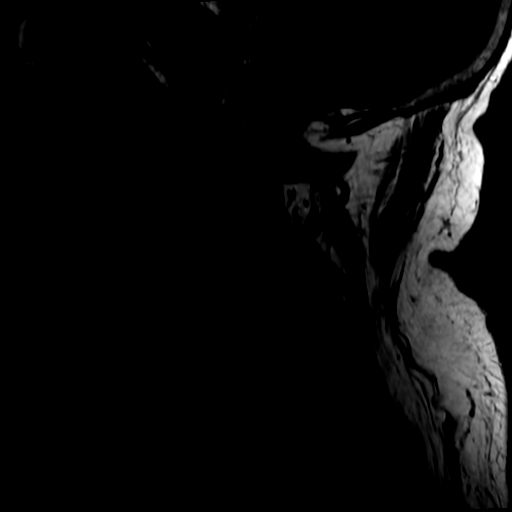

[Series 8: STIR · sagittal · 3.0mm · 0.43mm/px · 4 of 16 slices shown]
[im 1/16]
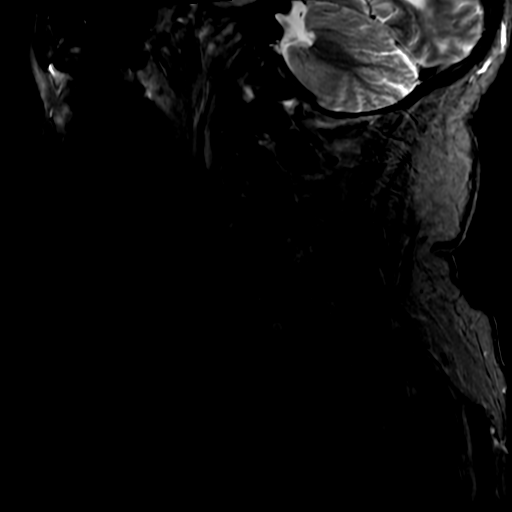
[im 6/16]
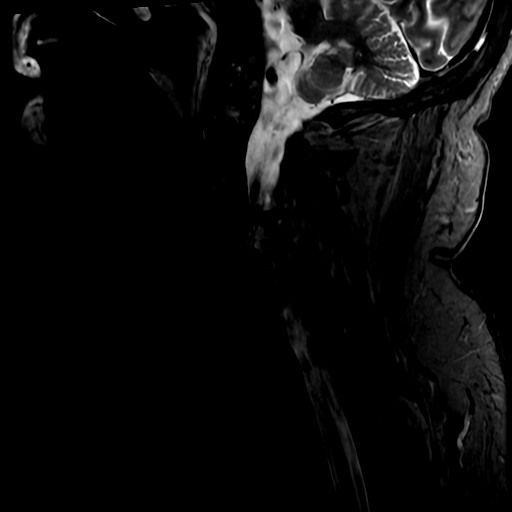
[im 11/16]
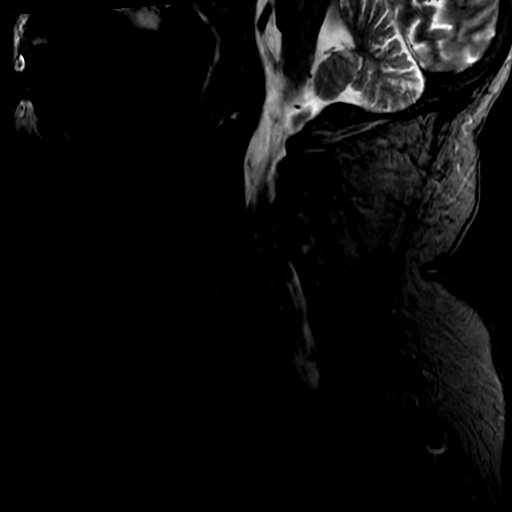
[im 16/16]
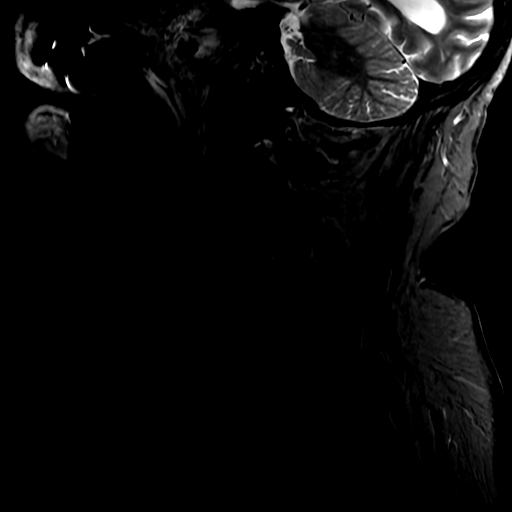

[Series 10: T2 · axial · 3.0mm · 0.35mm/px · z∈[-232,-140]mm · 8 of 32 slices shown (2 of 2)]
[im 1/32]
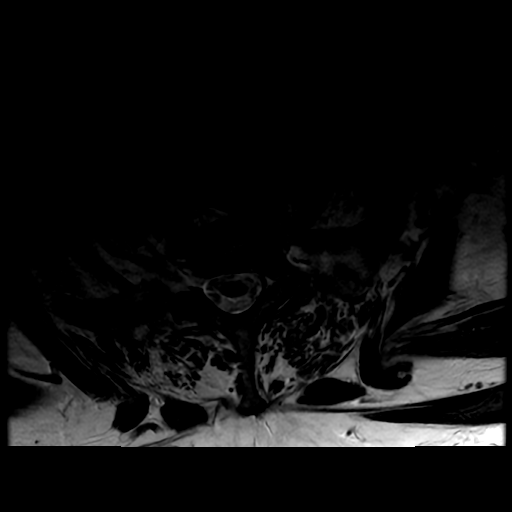
[im 5/32]
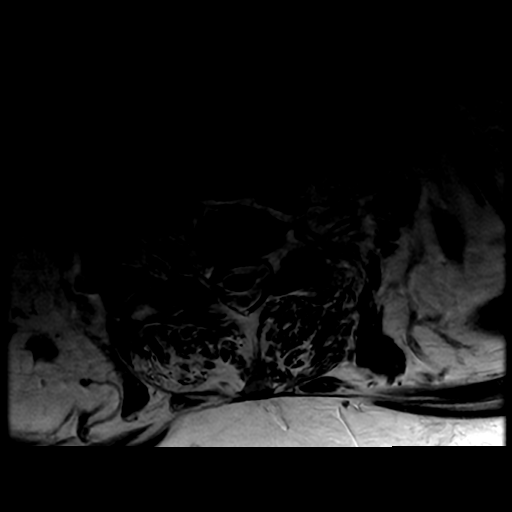
[im 9/32]
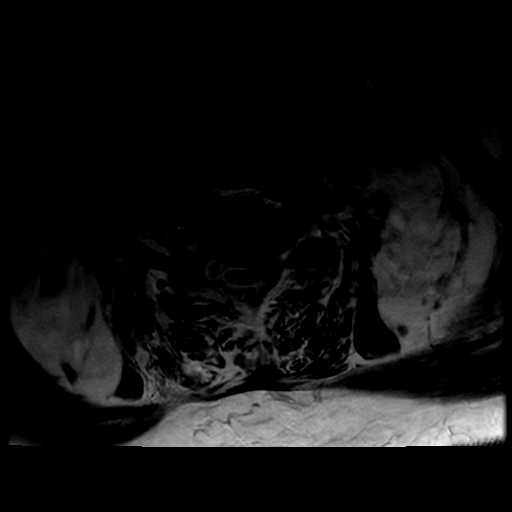
[im 14/32]
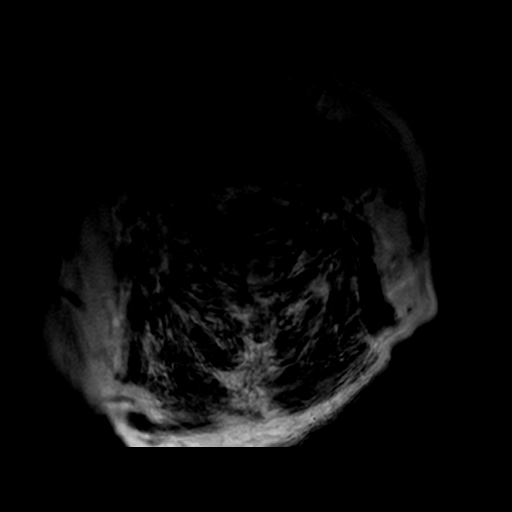
[im 18/32]
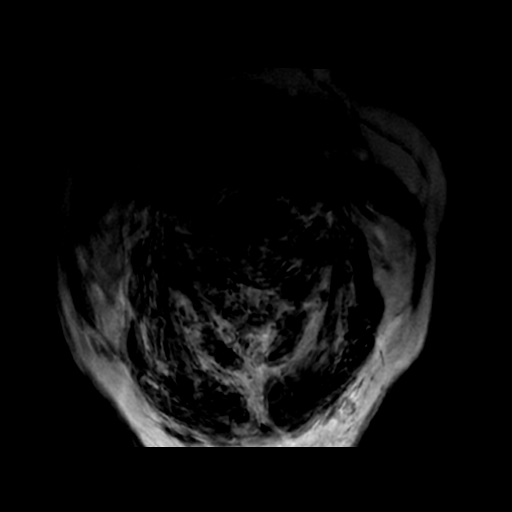
[im 23/32]
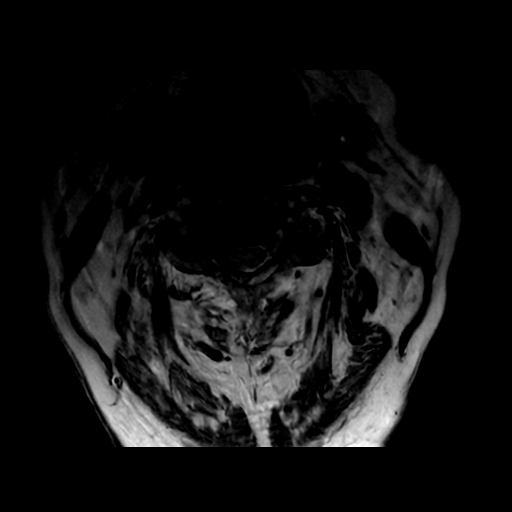
[im 27/32]
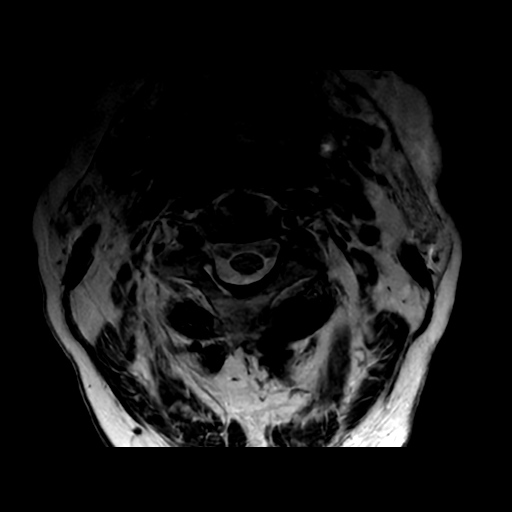
[im 32/32]
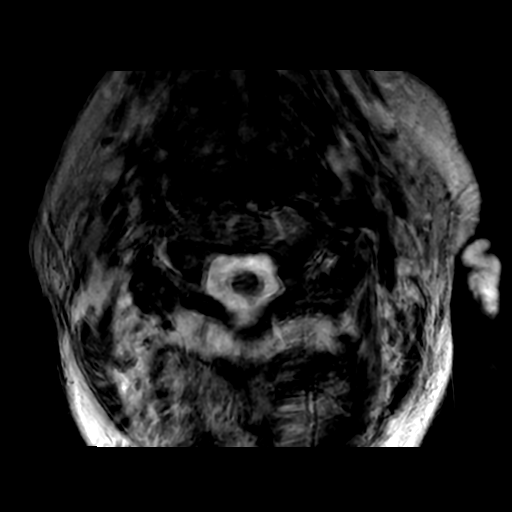

[Series 11: T1 · axial · 3.0mm · 0.70mm/px · z∈[-231,-140]mm · 7 of 28 slices shown]
[im 1/28]
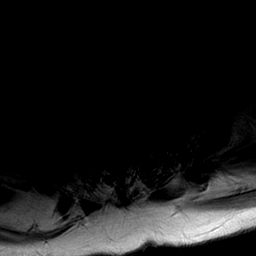
[im 5/28]
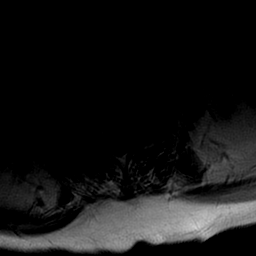
[im 10/28]
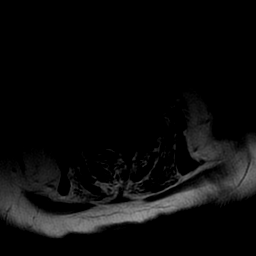
[im 14/28]
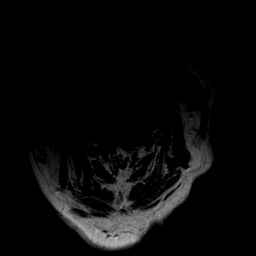
[im 19/28]
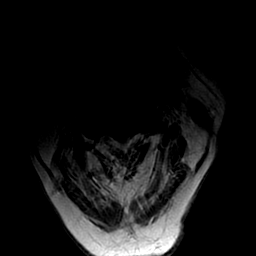
[im 23/28]
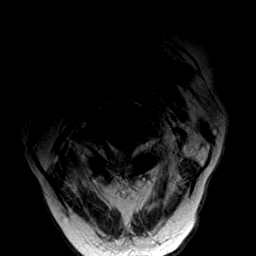
[im 28/28]
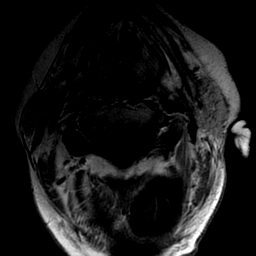

[24 of 48 positions shown; findings below may reference images not displayed]

FINDINGS: Alignment: Normal

Vertebrae: No acute abnormality.

Cord: Motion degraded examination but no visible cord abnormality.

Posterior Fossa, vertebral arteries, paraspinal tissues: Abnormal
left vertebral artery flow void.

Disc levels:

Motion degraded axial images limit evaluation of the neural
foramina.

C2-3: No spinal canal or neural foraminal stenosis.

C3-4: Large right subarticular osteophyte. Severe right foraminal
stenosis. No spinal canal stenosis.

C4-5: Right uncovertebral disc osteophyte complex with moderate
right foraminal stenosis.

C5-6: Right-greater-than-left uncovertebral hypertrophy. Moderate
right and mild left foraminal stenosis.

C6-7: Disc bulge with endplate spurring no spinal canal stenosis.
Mild left foraminal stenosis.

C7-T1: No spinal canal or neural foraminal stenosis.
IMPRESSION: 1. Motion degraded examination.
2. Severe right C3-4 neural foraminal stenosis due to large right
subarticular osteophyte.
3. Moderate right C4-5 and C5-6 neural foraminal stenosis due to
combination of uncovertebral hypertrophy and disc disease.
4. Abnormal left vertebral artery flow void may indicate slow flow
or occlusion.

## 2020-01-09 MED ORDER — LACTATED RINGERS IV SOLN: INTRAVENOUS | Status: DC

## 2020-01-09 MED ORDER — SODIUM CHLORIDE 0.9 % IV SOLN
3.0000 g | Freq: Three times a day (TID) | INTRAVENOUS | Status: DC
  Administered 2020-01-09 – 2020-01-12 (×10): 3 g via INTRAVENOUS
  Filled 2020-01-09 (×2): qty 3
  Filled 2020-01-09: qty 8
  Filled 2020-01-09 (×4): qty 3
  Filled 2020-01-09: qty 8
  Filled 2020-01-09 (×3): qty 3
  Filled 2020-01-09: qty 8

## 2020-01-09 MED ORDER — FENTANYL CITRATE (PF) 100 MCG/2ML IJ SOLN: 50.0000 ug | INTRAMUSCULAR | Status: DC

## 2020-01-09 MED ORDER — MIDAZOLAM HCL 2 MG/2ML IJ SOLN: 1.0000 mg | INTRAMUSCULAR | Status: DC

## 2020-01-09 MED ORDER — OXYCODONE-ACETAMINOPHEN 5-325 MG PO TABS: 1.0000 | ORAL_TABLET | Freq: Four times a day (QID) | ORAL | Status: DC | PRN

## 2020-01-09 NOTE — Progress Notes (Addendum)
PROGRESS NOTE    Michael Ware  JJO:[id number] DOB: [birth year] DOA: 01/07/2020 PCP: Alroy Dust, L.Marlou Sa, MD     Brief Narrative:  Michael Ware is a 86 y.o. male with medical history significant of bradycardia, HTN, HLD, PE, DVT, history of hospital delirium due to polypharmacy. He presented with BLE weakness, confusion, and right facial droop.  History per daughter. Has been working with PT at home for several weeks and was doing well. In normal state of health until Monday morning. On Monday morning he seemed to have a harder time with his exercises but generally ok. Tuesday morning, he called for help to get out of bed and his legs "were like noodles". Daughter helped him to the bathroom and back. When she got him to the bed, she noticed that he didn't have the strength to get back into the bed. PT noticed that he was weaker that day. The weakness progressed over the next day. They spoke with his PCP who recommended that he go to walk-in clinic or ED yesterday. The daughter noted that he was having a til to the left. They went to walk-in clinic and they recommended an MRI lumbar spine. They went home and the daughter noticed that he was having difficulty writing and he was more confused. She did what she knew for a stroke test: asked him to speak and smile. She said his speech seemed slurred but his smile was symmetrical. She became concerned and had him transported to the ED.    New events last 24 hours / Subjective: Daughter at bedside. Patient is alert, oriented to self and place but not to situation. Having some inconsistent thought process, thinks that his sister has conspired for him to be in the hospital.  Assessment & Plan:   Active Problems:   Lower extremity weakness   TIA (transient ischemic attack)   Acute metabolic encephalopathy and LE weakness TIA versus other -Presented with lower extremity weakness, leaning to the left, slurred speech and right facial droop -CT head without  acute intracranial abnormality -MRI brain negative -CTA head and neck without acute intracranial abnormality -MRI thoracic lumbar spine negative for acute finding -Stroke team following -PT OT SLP  Acute hypoxemic respiratory failure -SPO2 86% on room air, currently requiring 2 L nasal cannula O2 -Chest x-ray revealed left basilar airspace disease versus accentuation of cardiac fat pad, could represent developing infection -Repeat chest x-ray -Empiric Unasyn for possible aspiration pneumonia. Patient has SLP evaluation pending  History of hospital delirium and confusion due to polypharmacy -Limit narcotic use. Has been doing well on tramadol as needed. Has been taking Valium as needed nightly for years.  History of PE, DVT -Continue Xarelto  Hyperlipidemia -Continue pravachol  Anxiety -Continue Prozac  In agreement with assessment of the pressure ulcer as below:  Pressure Injury 01/08/20 Sacrum Medial (Active)  01/08/20 2300  Location: Sacrum  Location Orientation: Medial  Staging:   Wound Description (Comments):   Present on Admission: Yes         DVT prophylaxis:   rivaroxaban (XARELTO) tablet 20 mg  Code Status: DNR Family Communication: Daughter at bedside Disposition Plan:  Status is: Observation  The patient will require care spanning > 2 midnights and should be moved to inpatient because: Altered mental status and Ongoing diagnostic testing needed not appropriate for outpatient work up  Dispo: The patient is from: Home              Anticipated d/c is to: To be  determined              Anticipated d/c date is: 2 days              Patient currently is not medically stable to d/c. Still remains confused and mentation not at baseline. Neurology work-up pending further recommendation. PT OT SLP Pending. Start IV Unasyn for empiric aspiration pneumonia coverage      Consultants:   Neurology  Procedures:   None  Antimicrobials:  Anti-infectives (From  admission, onward)   None        Objective: Vitals:   01/08/20 1900 01/08/20 2000 01/08/20 2219 01/09/20 0549  BP: (!) 143/63 (!) 168/78 (!) 120/56 (!) 120/54  Pulse: 60 64 65 67  Resp: 17 (!) 22 20 19   Temp:   98.1 F (36.7 C) 99.7 F (37.6 C)  TempSrc:   Oral Axillary  SpO2: 96% 98% 100% 96%   No intake or output data in the 24 hours ending 01/09/20 1202 There were no vitals filed for this visit.  Examination:  General exam: Appears calm and comfortable  Respiratory system: + Rhonchi. Without respiratory distress. On nasal cannula O2 Cardiovascular system: S1 & S2 heard, RRR. No murmurs. No pedal edema. Gastrointestinal system: Abdomen is nondistended, soft and nontender. Normal bowel sounds heard. Central nervous system: Alert and oriented to self and place but not to situation. Right upper extremity strength limited by recent fracture and surgical intervention. Bilateral lower extremities strength +3-4/5 Extremities: Symmetric in appearance  Skin: No rashes, lesions or ulcers on exposed skin  Psychiatry: Still with some confusion and inconsistent memory   Data Reviewed: I have personally reviewed following labs and imaging studies  CBC: Recent Labs  Lab 01/07/20 2153  WBC 6.8  NEUTROABS 4.1  HGB 14.4  HCT 42.8  MCV 105.2*  PLT 628   Basic Metabolic Panel: Recent Labs  Lab 01/07/20 2153  NA 139  K 3.8  CL 101  CO2 28  GLUCOSE 100*  BUN 12  CREATININE 0.97  CALCIUM 9.0   GFR: CrCl cannot be calculated (Unknown ideal weight.). Liver Function Tests: Recent Labs  Lab 01/07/20 2153  AST 34  ALT 27  ALKPHOS 94  BILITOT 0.7  PROT 6.5  ALBUMIN 3.6   No results for input(s): LIPASE, AMYLASE in the last 168 hours. No results for input(s): AMMONIA in the last 168 hours. Coagulation Profile: No results for input(s): INR, PROTIME in the last 168 hours. Cardiac Enzymes: No results for input(s): CKTOTAL, CKMB, CKMBINDEX, TROPONINI in the last 168  hours. BNP (last 3 results) No results for input(s): PROBNP in the last 8760 hours. HbA1C: No results for input(s): HGBA1C in the last 72 hours. CBG: No results for input(s): GLUCAP in the last 168 hours. Lipid Profile: No results for input(s): CHOL, HDL, LDLCALC, TRIG, CHOLHDL, LDLDIRECT in the last 72 hours. Thyroid Function Tests: No results for input(s): TSH, T4TOTAL, FREET4, T3FREE, THYROIDAB in the last 72 hours. Anemia Panel: Recent Labs    01/08/20 1157 01/08/20 1158  VITAMINB12 766  --   FOLATE  --  17.0   Sepsis Labs: Recent Labs  Lab 01/07/20 2153  LATICACIDVEN 1.2    Recent Results (from the past 240 hour(s))  Resp Panel by RT-PCR (Flu A&B, Covid) Nasopharyngeal Swab     Status: None   Collection Time: 01/07/20 11:25 PM   Specimen: Nasopharyngeal Swab; Nasopharyngeal(NP) swabs in vial transport medium  Result Value Ref Range Status   SARS Coronavirus  2 by RT PCR NEGATIVE NEGATIVE Final    Comment: (NOTE) SARS-CoV-2 target nucleic acids are NOT DETECTED.  The SARS-CoV-2 RNA is generally detectable in upper respiratory specimens during the acute phase of infection. The lowest concentration of SARS-CoV-2 viral copies this assay can detect is 138 copies/mL. A negative result does not preclude SARS-Cov-2 infection and should not be used as the sole basis for treatment or other patient management decisions. A negative result may occur with  improper specimen collection/handling, submission of specimen other than nasopharyngeal swab, presence of viral mutation(s) within the areas targeted by this assay, and inadequate number of viral copies(<138 copies/mL). A negative result must be combined with clinical observations, patient history, and epidemiological information. The expected result is Negative.  Fact Sheet for Patients:  EntrepreneurPulse.com.au  Fact Sheet for Healthcare Providers:  IncredibleEmployment.be  This test  is no t yet approved or cleared by the [place] FDA and  has been authorized for detection and/or diagnosis of SARS-CoV-2 by FDA under an Emergency Use Authorization (EUA). This EUA will remain  in effect (meaning this test can be used) for the duration of the COVID-19 declaration under Section 564(b)(1) of the Act, 21 U.S.C.section 360bbb-3(b)(1), unless the authorization is terminated  or revoked sooner.       Influenza A by PCR NEGATIVE NEGATIVE Final   Influenza B by PCR NEGATIVE NEGATIVE Final    Comment: (NOTE) The Xpert Xpress SARS-CoV-2/FLU/RSV plus assay is intended as an aid in the diagnosis of influenza from Nasopharyngeal swab specimens and should not be used as a sole basis for treatment. Nasal washings and aspirates are unacceptable for Xpert Xpress SARS-CoV-2/FLU/RSV testing.  Fact Sheet for Patients: EntrepreneurPulse.com.au  Fact Sheet for Healthcare Providers: IncredibleEmployment.be  This test is not yet approved or cleared by the [place] FDA and has been authorized for detection and/or diagnosis of SARS-CoV-2 by FDA under an Emergency Use Authorization (EUA). This EUA will remain in effect (meaning this test can be used) for the duration of the COVID-19 declaration under Section 564(b)(1) of the Act, 21 U.S.C. section 360bbb-3(b)(1), unless the authorization is terminated or revoked.  Performed at [facility], [place] [address] [place], [place] [address]       Radiology Studies: CT ANGIO HEAD W OR WO CONTRAST  Result Date: 01/08/2020 CLINICAL DATA:  Leg weakness EXAM: CT ANGIOGRAPHY HEAD AND NECK TECHNIQUE: Multidetector CT imaging of the head and neck was performed using the standard protocol during bolus administration of intravenous contrast. Multiplanar CT image reconstructions and MIPs were obtained to evaluate the vascular anatomy. Carotid stenosis measurements (when applicable) are  obtained utilizing NASCET criteria, using the distal internal carotid diameter as the denominator. CONTRAST:  66mL OMNIPAQUE IOHEXOL 350 MG/ML SOLN COMPARISON:  01/07/2020 CT head FINDINGS: CT HEAD Brain: There is no acute intracranial hemorrhage, mass effect, or edema. No new loss of gray-white differentiation. There is no extra-axial fluid collection. Patchy and confluent areas of hypoattenuation in the supratentorial white matter likely reflects stable chronic microvascular ischemic changes. 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 Ventricles and sulci are stable in size and configuration. Vascular: There is atherosclerotic calcification at the skull base. Skull: Calvarium is unremarkable. Sinuses/Orbits: No acute finding. Other: None. Review of the MIP images confirms the above findings CTA NECK Aortic arch: Moderate calcified and noncalcified plaque along the arch and at the great vessel origins, which are patent. No high-grade subclavian artery stenosis. Right carotid system: Patent. Mixed plaque along the  common carotid causing less than 50% stenosis. Mixed plaque along the proximal internal carotid causing up to 60% stenosis. Left carotid system: Patent. Mixed plaque along the common carotid causing less than 50% stenosis. Mixed plaque along the proximal internal carotid causing less than 50% stenosis. Vertebral arteries: Patent. Right vertebral artery is dominant. Multifocal irregularity and stenosis the left including severe stenosis at the V1-V2 junction. Skeleton: Degenerative changes of the included spine. Other neck: No mass or adenopathy. Upper chest: Emphysema. Review of the MIP images confirms the above findings CTA HEAD Anterior circulation: Intracranial internal carotid arteries are patent with primarily calcified plaque resulting in up to moderate stenosis. Anterior and middle cerebral arteries are patent. Posterior circulation: The intracranial vertebral arteries are patent. There is  mild plaque on the right. There is high-grade stenosis or occlusion of the mid left vertebral artery just beyond the PICA origin. Reconstitution distally. Basilar artery is patent. Posterior cerebral arteries are patent. Venous sinuses: Patent as allowed by contrast bolus timing. Review of the MIP images confirms the above findings IMPRESSION: No acute intracranial abnormality. Plaque along the proximal right internal carotid causes up to 60% stenosis. Plaque along the proximal left internal carotid causes less than 50% stenosis. Multifocal left vertebral artery stenosis including severe stenosis at the V1-V2 junction. Up to moderate stenosis of the intracranial intra carotid arteries. High-grade stenosis or occlusion of the mid left intracranial vertebral artery just beyond the PICA origin with distal reconstitution. Electronically Signed   By: Macy Mis M.D.   On: 01/08/2020 10:37   CT Head Wo Contrast  Result Date: 01/07/2020 CLINICAL DATA:  Daughter is in town to take care of patient. States weakness started on Sunday. Multiple episodes when she has had to lower him to the floor. Pt denies fall or injury. EMS has been to the house multiple time to assist with lifting patient from the floor. Pt is leaning to the left side. Equal grip strength bilaterally. No facial droop or altered sensation. " EXAM: CT HEAD WITHOUT CONTRAST TECHNIQUE: Contiguous axial images were obtained from the base of the skull through the vertex without intravenous contrast. COMPARISON:  11/12/2019 FINDINGS: Brain: No evidence of acute infarction, hemorrhage, hydrocephalus, extra-axial collection or mass lesion/mass effect. There is ventricular sulcal enlargement reflecting mild to moderate diffuse atrophy. Patchy bilateral white matter hypoattenuation is also noted consistent with moderate chronic microvascular ischemic change. These findings are stable. Vascular: No hyperdense vessel or unexpected calcification. Skull: Normal.  Negative for fracture or focal lesion. Sinuses/Orbits: Globes and orbits are unremarkable. Sinuses and mastoid air cells are clear. Other: None. IMPRESSION: 1. No acute intracranial abnormalities. 2. Atrophy and chronic microvascular ischemic change. Stable appearance from the prior head CT. Electronically Signed   By: Lajean Manes M.D.   On: 01/07/2020 20:51   CT ANGIO NECK W OR WO CONTRAST  Result Date: 01/08/2020 CLINICAL DATA:  Leg weakness EXAM: CT ANGIOGRAPHY HEAD AND NECK TECHNIQUE: Multidetector CT imaging of the head and neck was performed using the standard protocol during bolus administration of intravenous contrast. Multiplanar CT image reconstructions and MIPs were obtained to evaluate the vascular anatomy. Carotid stenosis measurements (when applicable) are obtained utilizing NASCET criteria, using the distal internal carotid diameter as the denominator. CONTRAST:  33mL OMNIPAQUE IOHEXOL 350 MG/ML SOLN COMPARISON:  01/07/2020 CT head FINDINGS: CT HEAD Brain: There is no acute intracranial hemorrhage, mass effect, or edema. No new loss of gray-white differentiation. There is no extra-axial fluid collection. Patchy and confluent areas of  hypoattenuation in the supratentorial white matter likely reflects stable chronic microvascular ischemic changes. 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 Ventricles and sulci are stable in size and configuration. Vascular: There is atherosclerotic calcification at the skull base. Skull: Calvarium is unremarkable. Sinuses/Orbits: No acute finding. Other: None. Review of the MIP images confirms the above findings CTA NECK Aortic arch: Moderate calcified and noncalcified plaque along the arch and at the great vessel origins, which are patent. No high-grade subclavian artery stenosis. Right carotid system: Patent. Mixed plaque along the common carotid causing less than 50% stenosis. Mixed plaque along the proximal internal carotid causing up to 60% stenosis.  Left carotid system: Patent. Mixed plaque along the common carotid causing less than 50% stenosis. Mixed plaque along the proximal internal carotid causing less than 50% stenosis. Vertebral arteries: Patent. Right vertebral artery is dominant. Multifocal irregularity and stenosis the left including severe stenosis at the V1-V2 junction. Skeleton: Degenerative changes of the included spine. Other neck: No mass or adenopathy. Upper chest: Emphysema. Review of the MIP images confirms the above findings CTA HEAD Anterior circulation: Intracranial internal carotid arteries are patent with primarily calcified plaque resulting in up to moderate stenosis. Anterior and middle cerebral arteries are patent. Posterior circulation: The intracranial vertebral arteries are patent. There is mild plaque on the right. There is high-grade stenosis or occlusion of the mid left vertebral artery just beyond the PICA origin. Reconstitution distally. Basilar artery is patent. Posterior cerebral arteries are patent. Venous sinuses: Patent as allowed by contrast bolus timing. Review of the MIP images confirms the above findings IMPRESSION: No acute intracranial abnormality. Plaque along the proximal right internal carotid causes up to 60% stenosis. Plaque along the proximal left internal carotid causes less than 50% stenosis. Multifocal left vertebral artery stenosis including severe stenosis at the V1-V2 junction. Up to moderate stenosis of the intracranial intra carotid arteries. High-grade stenosis or occlusion of the mid left intracranial vertebral artery just beyond the PICA origin with distal reconstitution. Electronically Signed   By: Macy Mis M.D.   On: 01/08/2020 10:37   MR BRAIN WO CONTRAST  Result Date: 01/08/2020 CLINICAL DATA:  Confusion and worsening right lower extremity weakness with ataxia. EXAM: MRI HEAD WITHOUT CONTRAST TECHNIQUE: Multiplanar, multiecho pulse sequences of the brain and surrounding structures were  obtained without intravenous contrast. COMPARISON:  Head CT 01/07/2020 FINDINGS: Brain: No acute infarction, hemorrhage, hydrocephalus, extra-axial collection or mass lesion. Chronic small vessel ischemia in the periventricular white matter, mild for age. Cerebral volume loss which is also generalized and mild for age. Vascular: Normal flow voids Skull and upper cervical spine: Normal marrow signal Sinuses/Orbits: Negative IMPRESSION: Senescent changes without acute or reversible finding. Electronically Signed   By: Monte Fantasia M.D.   On: 01/08/2020 06:30   MR THORACIC SPINE WO CONTRAST  Result Date: 01/08/2020 CLINICAL DATA:  Cord compression, a tracks E a. Nontraumatic. Worsening right lower extremity weakness and ataxia. EXAM: MRI THORACIC SPINE WITHOUT CONTRAST TECHNIQUE: Multiplanar, multisequence MR imaging of the thoracic spine was performed. No intravenous contrast was administered. COMPARISON:  Chest CT from 11/10/2019 FINDINGS: Alignment:  Negative for listhesis. Vertebrae: No fracture, evidence of discitis, or bone lesion. Cord:  Normal signal and morphology. Paraspinal and other soft tissues: Marked fatty atrophy of intrinsic back muscles throughout the thoracic spine. Disc levels: Generalized disc desiccation and narrowing. Spondylitic spurring throughout the mid to lower thoracic spine. Negative facets. No neural compression. IMPRESSION: 1. No acute finding.  Negative MRI of  the thoracic cord. 2. Generalized thoracic spondylosis. 3. Prominent axial muscular atrophy. Electronically Signed   By: Monte Fantasia M.D.   On: 01/08/2020 07:06   MR LUMBAR SPINE WO CONTRAST  Result Date: 01/08/2020 CLINICAL DATA:  Lumbar radiculopathy. Worse on right lower extremity weakness and a core slight EXAM: MRI LUMBAR SPINE WITHOUT CONTRAST TECHNIQUE: Multiplanar, multisequence MR imaging of the lumbar spine was performed. No intravenous contrast was administered. COMPARISON:  Lumbar myelogram 8 chorion  FINDINGS: Segmentation:  Transitional S1 vertebra. Alignment:  Levoscoliosis and slight L5-S1 retrolisthesis Vertebrae:  No fracture, evidence of discitis, or bone lesion. Conus medullaris and cauda equina: Conus extends to the T12-L1 level. Conus and cauda equina appear normal. Paraspinal and other soft tissues: Prominent fatty atrophy of intrinsic back muscles CT Disc levels: L1-L2: Mild spondylosis with bridging osteophyte. L2-L3: Spondylosis.  No impingement L3-L4: Spondylosis with ventral spurring.  No neural impingement L4-L5: . For disc narrowing back bulky left far-lateral marginal spurring. No neural compression. L5-S1:Disc narrowing with bulky left far-lateral spurring. The annulus is fissured posteriorly and left lateral. Asymmetric left facet spurring. Noncompressive left foraminal narrowing. S1-2: Asymmetric left facet spurring. Left far-lateral spurring. No neural compression. IMPRESSION: 1. No acute finding. No neural compression on the symptomatic right side. 2. Spondylosis and degenerative disease with scoliosis that is very similar to 2014. 3. Prominent fatty atrophy of intrinsic back muscles. 4. Transitional S1 vertebra. Electronically Signed   By: Monte Fantasia M.D.   On: 01/08/2020 07:22   DG CHEST PORT 1 VIEW  Result Date: 01/08/2020 CLINICAL DATA:  86 year old male with cerebrovascular accident presenting with confusion and RIGHT lower extremity weakness and ataxia EXAM: PORTABLE CHEST 1 VIEW COMPARISON:  November 10, 2019 FINDINGS: Image rotated to the LEFT. Accounting for this cardiomediastinal contours are stable. The LEFT basilar airspace disease with partial obscuration of LEFT hemidiaphragm versus is accentuation of cardiac fat pad due to rotation projecting over this area. RIGHT lung is clear. On limited assessment no acute skeletal process. IMPRESSION: LEFT basilar airspace disease versus accentuation of cardiac fat pad. Could represent developing infection. Continued follow-up  radiographs without rotation may be helpful to exclude the possibility of developing infection in this location. Electronically Signed   By: Zetta Bills M.D.   On: 01/08/2020 09:28      Scheduled Meds: . FLUoxetine  10 mg Oral Daily  . pravastatin  40 mg Oral Daily  . rivaroxaban  20 mg Oral Q supper   Continuous Infusions:   LOS: 0 days      Time spent: 45 minutes   Dessa Phi, DO [facility] 01/09/2020, 12:02 PM   Available via Epic secure chat 7am-7pm After these hours, please refer to coverage provider listed on [email or web address]

## 2020-01-09 NOTE — Progress Notes (Signed)
  Speech Language Pathology  Patient Details Name: JANET DECESARE MRN: [id number] DOB: 10-01-1931 Today's Date: 01/09/2020 Time: 4600-     MBS scheduled today at 2:30                [place], [place] Wentzell Willis 01/09/2020, 11:48 AM  Orbie Pyo Colvin Caroli.Ed Risk analyst [phone] Office [phone]

## 2020-01-09 NOTE — Evaluation (Signed)
Occupational Therapy Evaluation Patient Details Name: Michael Ware MRN: [id number] DOB: July 29, 1931 Today's Date: 01/09/2020    History of Present Illness 86 yo male admitted with R midshaft humerus fx after falling at home. Pt underwent ORIF of RT humeral fracture on 11/07/19.  Please refer to new OT shoulder precautions/restrictions now s/p ORIF 11/07/19. Hx of bradycardia.  ( New Dx RIGHT lower lobe PE started Heprin Drip 11/10/19 at 20:15 )   Clinical Impression   Pt presents with decline in function and safety with ADLs and ADL mobility with impaired strength, balance, endurance, pain R UE with impaired AROM of R UE. Pt hs been recently cleared for WBAT and AROM of R UE. Pt would benefit from acute OT services to address impairments to maximize level of function and safety    Follow Up Recommendations  SNF;Supervision/Assistance - 24 hour    Equipment Recommendations  Other (comment) (TBD at next venue of care)    Recommendations for Other Services       Precautions / Restrictions Precautions Precautions: Fall Restrictions Weight Bearing Restrictions: No      Mobility Bed Mobility Overal bed mobility: Needs Assistance Bed Mobility: Supine to Sit;Sit to Supine     Supine to sit: Mod assist;HOB elevated Sit to supine: Mod assist   General bed mobility comments: pt requires assistance for mobilizing BLEs and to pull trunk into sitting    Transfers Overall transfer level: Needs assistance Equipment used: 1 person hand held assist Transfers: Sit to/from Stand Sit to Stand: Mod assist         General transfer comment: pt unable due to fatigue, R UE pain and neurologist entering room and pt/family had been waiting to speak with MD    Balance Overall balance assessment: Needs assistance Sitting-balance support: Single extremity supported;No upper extremity supported Sitting balance-Leahy Scale: Fair Sitting balance - Comments: close supervision at edge of bed for  static sitting   Standing balance support: Bilateral upper extremity supported Standing balance-Leahy Scale: Poor Standing balance comment: minA for static standing with BUE support of PT                           ADL either performed or assessed with clinical judgement   ADL Overall ADL's : Needs assistance/impaired Eating/Feeding: Set up;Sitting;Bed level   Grooming: Wash/dry hands;Wash/dry face;Min guard;Sitting   Upper Body Bathing: Moderate assistance;Sitting   Lower Body Bathing: Maximal assistance;Sitting/lateral leans   Upper Body Dressing : Moderate assistance   Lower Body Dressing: Total assistance     Toilet Transfer Details (indicate cue type and reason): NT, per PT note, pt is mod A with 1 person HHA for sit - stand Toileting- Clothing Manipulation and Hygiene: Total assistance;Sitting/lateral lean               Vision Baseline Vision/History: Wears glasses Patient Visual Report: No change from baseline       Perception     Praxis      Pertinent Vitals/Pain Pain Assessment: 0-10 Pain Score: 7  Faces Pain Scale: No hurt Pain Location: R arm with movement Pain Descriptors / Indicators: Grimacing Pain Intervention(s): Limited activity within patient's tolerance;Monitored during session;Premedicated before session;Repositioned     Hand Dominance Right   Extremity/Trunk Assessment Upper Extremity Assessment Upper Extremity Assessment: RUE deficits/detail RUE Deficits / Details: ROM WFL, recently cleared for gradual resistive exercise and weightbearing. R UE edematous. Pt's daughter reports that pt was able to perform AROM  exercises in multiple planes x 30 reps recently   Lower Extremity Assessment Lower Extremity Assessment: Defer to PT evaluation RLE Sensation: decreased light touch   Cervical / Trunk Assessment Cervical / Trunk Assessment: Kyphotic   Communication Communication Communication:  (dysarthric)   Cognition  Arousal/Alertness: Awake/alert Behavior During Therapy: WFL for tasks assessed/performed Overall Cognitive Status: Impaired/Different from baseline Area of Impairment: Orientation;Following commands;Safety/judgement;Awareness;Problem solving                 Orientation Level: Disoriented to;Time (pt knows it is december, reports it being thursday)     Following Commands: Follows one step commands consistently;Follows one step commands with increased time Safety/Judgement: Decreased awareness of safety Awareness: Emergent Problem Solving: Slow processing     General Comments  pt on 2L [place] during session, VSS. Pt is very warm to touch    Exercises Other Exercises Other Exercises: Reviewed with pt and daughter elevated positinong of R UE to decrease edema Other Exercises: Educated pt and daughter on edema massage of R UE to decrease pain and increase AROM, AROM of R UE elbow flexion/extension, forearm pronation/supination, shoulder FF   Shoulder Instructions      Home Living Family/patient expects to be discharged to:: Private residence Living Arrangements: Children Available Help at Discharge: Family;Available 24 hours/day Type of Home: House Home Access: Stairs to enter [employer] of Steps: 3 Entrance Stairs-Rails: Right;Left;Can reach both Home Layout: Two level Alternate Level Stairs-Number of Steps: patient has stair lift   Bathroom Shower/Tub: Occupational psychologist: Handicapped height     Home Equipment: Toilet riser;Shower seat;Grab bars - tub/shower;Cane - single point;Walker - 2 wheels;Walker - 4 wheels;Transport chair;Cane - quad          Prior Functioning/Environment Level of Independence: Needs assistance  Gait / Transfers Assistance Needed: independent prior to fall in september, since D/C from SNF pt ambulates with minG at home ADL's / Homemaking Assistance Needed: pt required assist with ADLs due to R shoulder restrictions. Prior  to shoulder fx pt was Ind with all ADLs/selfcare            OT Problem List: Decreased strength;Decreased range of motion;Decreased activity tolerance;Impaired balance (sitting and/or standing);Decreased coordination;Decreased cognition;Decreased safety awareness;Pain;Increased edema;Impaired UE functional use      OT Treatment/Interventions: Self-care/ADL training;Therapeutic exercise;DME and/or AE instruction;Therapeutic activities;Patient/family education;Balance training    OT Goals(Current goals can be found in the care plan section) Acute Rehab OT Goals Patient Stated Goal: To get stronger and return to recent baseline OT Goal Formulation: With patient/family Time For Goal Achievement: 01/23/20 Potential to Achieve Goals: Good ADL Goals Pt Will Perform Grooming: with supervision;with set-up;sitting Pt Will Perform Upper Body Bathing: with min assist;sitting Pt Will Perform Lower Body Bathing: with mod assist;sitting/lateral leans Pt Will Perform Upper Body Dressing: with min assist;sitting Pt Will Transfer to Toilet: with min assist;with min guard assist;ambulating;stand pivot transfer;regular height toilet;bedside commode;grab bars Additional ADL Goal #1: Pt will tolerate edema massage to R UE to decrease pain and increase AROM to improve funcitomal use for ADLs Additional ADL Goal #2: Pt will tolerate A/AROM of R UE to improve functional use  OT Frequency: Min 2X/week   Barriers to D/C:            Co-evaluation              AM-PAC OT "6 Clicks" Daily Activity     Outcome Measure Help from another person eating meals?: A Little Help from another person  taking care of personal grooming?: A Little Help from another person toileting, which includes using toliet, bedpan, or urinal?: Total Help from another person bathing (including washing, rinsing, drying)?: A Lot Help from another person to put on and taking off regular upper body clothing?: A Lot Help from another  person to put on and taking off regular lower body clothing?: Total 6 Click Score: 12   End of Session    Activity Tolerance: Patient limited by pain;Patient limited by fatigue Patient left: in bed;with call bell/phone within reach;with nursing/sitter in room;with family/visitor present;Other (comment) (MD)  OT Visit Diagnosis: Other abnormalities of gait and mobility (R26.89);History of falling (Z91.81);Muscle weakness (generalized) (M62.81);Pain Pain - Right/Left: Right Pain - part of body: Shoulder;Arm                Time: 1541-1610 OT Time Calculation (min): 29 min Charges:  OT General Charges $OT Visit: 1 Visit OT Evaluation $OT Eval Moderate Complexity: 1 Mod OT Treatments $Therapeutic Activity: 8-22 mins    [name] 01/09/2020, 4:56 PM

## 2020-01-09 NOTE — Plan of Care (Signed)

## 2020-01-09 NOTE — Evaluation (Signed)
Physical Therapy Evaluation Patient Details Name: Michael Ware MRN: [id number] DOB: 05/14/1931 Today's Date: 01/09/2020   History of Present Illness  86 yo male admitted with R midshaft humerus fx after falling at home. Pt underwent ORIF of RT humeral fracture on 11/07/19.  Please refer to new OT shoulder precautions/restrictions now s/p ORIF 11/07/19. Hx of bradycardia.  ( New Dx RIGHT lower lobe PE started Heprin Drip 11/10/19 at 20:15 )  Clinical Impression  Pt presents to PT with deficits in strength, cognition, functional mobility, gait, balance, endurance. Pt initially lethargic upon PT entrance but becomes more alert with history and more so with mobility. Pt is generally weak and requires physical assistance for all functional mobility at this time. Pt recently cleared for slow gradual resistive exercise and weightbearing through RUE per pt's daughter (recent R humerus fx in September). Pt will benefit from continued acute PT POC to improve LE strength and mobility quality while reducing falls risk. PT recommends SNF placement at this time.    Follow Up Recommendations SNF;Supervision/Assistance - 24 hour    Equipment Recommendations  None recommended by PT    Recommendations for Other Services       Precautions / Restrictions Precautions Precautions: Fall Restrictions Weight Bearing Restrictions: No      Mobility  Bed Mobility Overal bed mobility: Needs Assistance Bed Mobility: Supine to Sit;Sit to Supine     Supine to sit: Mod assist;HOB elevated Sit to supine: Mod assist   General bed mobility comments: pt requires assistance for mobilizing BLEs and to pull trunk into sitting    Transfers Overall transfer level: Needs assistance Equipment used: 1 person hand held assist Transfers: Sit to/from Stand Sit to Stand: Mod assist            Ambulation/Gait Ambulation/Gait assistance:  (deferred, transport present to take pt to [facility])              Stairs             Wheelchair Mobility    Modified Rankin (Stroke Patients Only)       Balance Overall balance assessment: Needs assistance Sitting-balance support: Single extremity supported;No upper extremity supported Sitting balance-Leahy Scale: Fair Sitting balance - Comments: close supervision at edge of bed for static sitting   Standing balance support: Bilateral upper extremity supported Standing balance-Leahy Scale: Poor Standing balance comment: minA for static standing with BUE support of PT                             Pertinent Vitals/Pain Pain Assessment: Faces Faces Pain Scale: Hurts little more Pain Location: R arm with movement Pain Descriptors / Indicators: Grimacing Pain Intervention(s): Monitored during session    Home Living Family/patient expects to be discharged to:: Private residence Living Arrangements: Children (daughters alternating for 24/7 care since september) Available Help at Discharge: Family;Available 24 hours/day Type of Home: House Home Access: Stairs to enter Entrance Stairs-Rails: Right;Left;Can reach both Entrance Stairs-Number of Steps: 3 Home Layout: Two level Home Equipment: Toilet riser;Shower seat;Grab bars - tub/shower;Cane - single point;Walker - 2 wheels;Walker - 4 wheels;Transport chair;Cane - quad      Prior Function Level of Independence: Needs assistance   Gait / Transfers Assistance Needed: independent prior to fall in september, since D/C from SNF pt ambulates with minG at home           Hand Dominance   Dominant Hand: Right    Extremity/Trunk  Assessment   Upper Extremity Assessment Upper Extremity Assessment: RUE deficits/detail RUE Deficits / Details: ROM WFL, recently cleared for gradual resistive exercise and weightbearing    Lower Extremity Assessment Lower Extremity Assessment: RLE deficits/detail;Generalized weakness RLE Sensation: decreased light touch    Cervical / Trunk  Assessment Cervical / Trunk Assessment: Kyphotic  Communication   Communication:  (dysarthric)  Cognition Arousal/Alertness: Awake/alert Behavior During Therapy: WFL for tasks assessed/performed Overall Cognitive Status: Impaired/Different from baseline Area of Impairment: Orientation;Following commands;Safety/judgement;Awareness;Problem solving                 Orientation Level: Disoriented to;Time (pt knows it is december, reports it being thursday)     Following Commands: Follows one step commands consistently;Follows one step commands with increased time Safety/Judgement: Decreased awareness of safety Awareness: Emergent Problem Solving: Slow processing        General Comments General comments (skin integrity, edema, etc.): pt on 2L [place] during session, VSS. Pt is very warm to touch    Exercises     Assessment/Plan    PT Assessment Patient needs continued PT services  PT Problem List Decreased strength;Decreased activity tolerance;Decreased balance;Decreased mobility;Decreased cognition;Decreased knowledge of use of DME;Decreased safety awareness;Decreased knowledge of precautions;Cardiopulmonary status limiting activity;Impaired sensation       PT Treatment Interventions DME instruction;Gait training;Functional mobility training;Stair training;Therapeutic activities;Therapeutic exercise;Balance training;Neuromuscular re-education;Cognitive remediation;Patient/family education    PT Goals (Current goals can be found in the Care Plan section)  Acute Rehab PT Goals Patient Stated Goal: To get stronger and return to recent baseline PT Goal Formulation: With patient/family Time For Goal Achievement: 01/23/20 Potential to Achieve Goals: Good    Frequency Min 2X/week   Barriers to discharge        Co-evaluation               AM-PAC PT "6 Clicks" Mobility  Outcome Measure Help needed turning from your back to your side while in a flat bed without using  bedrails?: A Lot Help needed moving from lying on your back to sitting on the side of a flat bed without using bedrails?: A Lot Help needed moving to and from a bed to a chair (including a wheelchair)?: A Lot Help needed standing up from a chair using your arms (e.g., wheelchair or bedside chair)?: A Lot Help needed to walk in hospital room?: A Lot Help needed climbing 3-5 steps with a railing? : Total 6 Click Score: 11    End of Session   Activity Tolerance: Patient tolerated treatment well Patient left: in bed;with call bell/phone within reach;with bed alarm set;with family/visitor present Nurse Communication: Mobility status PT Visit Diagnosis: Other abnormalities of gait and mobility (R26.89);Muscle weakness (generalized) (M62.81)    Time: 4696-2952 PT Time Calculation (min) (ACUTE ONLY): 20 min   Charges:   PT Evaluation $PT Eval Moderate Complexity: 1 Mod          Zenaida Niece, PT, DPT Acute Rehabilitation Pager: [phone]   Zenaida Niece 01/09/2020, 2:18 PM

## 2020-01-09 NOTE — Evaluation (Signed)
Clinical/Bedside Swallow Evaluation Patient Details  Name: Michael Ware MRN: [id number] Date of Birth: 05/14/1931  Today's Date: 01/09/2020 Time: SLP Start Time (ACUTE ONLY): 1125 SLP Stop Time (ACUTE ONLY): 1155 SLP Time Calculation (min) (ACUTE ONLY): 30 min  Past Medical History:  Past Medical History:  Diagnosis Date  . Acute GI bleeding 01/19/13   most likely due to diverticulosis  . BPH (benign prostatic hypertrophy) with urinary obstruction   . Bradycardia   . Diverticulosis   . Elevated hemoglobin A1c   . Hyperglycemia   . Hyperlipidemia   . Hypertension   . Internal hemorrhoids with bleeding and fecal soiling 11/19/2014  . Rectal bleed 01/19/13  . Vitamin D deficiency    Past Surgical History:  Past Surgical History:  Procedure Laterality Date  . ANAL FISSURE REPAIR  1980  . HEMORRHOID BANDING    . ORIF HUMERUS FRACTURE Right 11/07/2019   Procedure: OPEN REDUCTION INTERNAL FIXATION (ORIF) DISTAL HUMERUS FRACTURE;  Surgeon: Justice Britain, MD;  Location: [facility];  Service: Orthopedics;  Laterality: Right;  . rectal fissure procedure  1970's  . ROTATOR CUFF REPAIR     bilateral  . TONSILLECTOMY     HPI:  Michael Ware is a 86 y.o. male with medical history significant of bradycardia, HTN, HLD. Presenting with BLE weakness, confusion, and right facial droop. MRI Senescent changes without acute or reversible finding.   Assessment / Plan / Recommendation Clinical Impression  Pt demonstrates concern for pharyngeal dysphagia with daughter at bedside who reported coughing during breakfast. He had delayed consistent coughing after thin liquids. MRI negative for stroke however right facial/labial droop and clinical signs warrant instrumental testing. MBS scheduled today at 2:00. Recommend NPO except sips water only.     SLP Visit Diagnosis: Dysphagia, unspecified (R13.10)    Aspiration Risk  Mild aspiration risk;Moderate aspiration risk    Diet Recommendation Thin  liquid;Other (Comment) (sips water)   Liquid Administration via: Cup    Other  Recommendations Oral Care Recommendations: Oral care BID   Follow up Recommendations        Frequency and Duration            Prognosis Prognosis for Safe Diet Advancement: Good      Swallow Study   General HPI: Michael Ware is a 86 y.o. male with medical history significant of bradycardia, HTN, HLD. Presenting with BLE weakness, confusion, and right facial droop. MRI Senescent changes without acute or reversible finding. Type of Study: Bedside Swallow Evaluation Previous Swallow Assessment:  (nonw) Diet Prior to this Study: Thin liquids;Regular Temperature Spikes Noted: No Respiratory Status: Nasal cannula History of Recent Intubation: No Behavior/Cognition: Alert;Cooperative;Requires cueing Oral Cavity Assessment: Within Functional Limits Oral Care Completed by SLP: No Oral Cavity - Dentition: Adequate natural dentition Vision: Functional for self-feeding Self-Feeding Abilities: Needs assist Patient Positioning: Upright in bed Baseline Vocal Quality: Normal Volitional Cough: Strong Volitional Swallow: Able to elicit    Oral/Motor/Sensory Function Overall Oral Motor/Sensory Function: Mild impairment Facial ROM: Reduced right;Suspected CN VII (facial) dysfunction Facial Symmetry: Abnormal symmetry right;Suspected CN VII (facial) dysfunction Facial Strength: Reduced right;Suspected CN VII (facial) dysfunction   Ice Chips Ice chips: Within functional limits   Thin Liquid Thin Liquid: Impaired Presentation: Cup Pharyngeal  Phase Impairments: Cough - Delayed    Nectar Thick Nectar Thick Liquid: Not tested   Honey Thick Honey Thick Liquid: Not tested   Puree Puree: Within functional limits   Solid     Solid: Not tested  Houston Siren 01/09/2020,1:26 PM  Orbie Pyo Colvin Caroli.Ed Risk analyst [phone] Office [phone]

## 2020-01-09 NOTE — Progress Notes (Signed)
OT Cancellation Note  Patient Details Name: Michael Ware MRN: [id number] DOB: 03/12/1931   Cancelled Treatment:    Reason Eval/Treat Not Completed: Patient at procedure or test/ unavailable Pt off unit for MBS, OT will follow up next available time  [name] 01/09/2020, 2:06 PM

## 2020-01-09 NOTE — Consult Note (Signed)
Stroke Neurology Consultation Note  Consult Requested by: [facility]  Reason for Consult: seen by teleneurology 01/08/2020 at [facility] ED for BLE weakness, confusion and R facial droop  Consult Date:  01/09/20  The history was obtained from the daughter.  During history and examination, all items were able to obtain unless otherwise noted.  History of Present Illness:  Michael [place] is an 86 y.o. Caucasian male with recent humeral fx s/p repair who recently finished rehab and went home. At home, pt two daughters take turns to take care of him. Per daughter, he was able to work slowly with belt hold by daughter, he did not require walker or cane with daughter assist. He seems getting slow improvement day by day for the last one month.   Last Monday night, he went to bed at his new baseline. He was able to get out to go to bathroom at middle of night by himself. However, later, pt called daughter to help him go to bathroom and daughter found pt "leg likes noodles", can not hold himself, but daughter managed him to the toilet. On the way back, pt again not able to walk and daughter had to sit him down on the floor and called 911. Paramedics helped pt back to bed that night. Tuesday all day, he was in bed, lethargic. Tuesday night again, he tried to go to bathroom but too weak to hold and daughter again put him on the floor called 911. Wednesday, he was in the dinning table, lethargic but leaning toward left side, not able sit straight. He was Estate manager/land agent and sharp minded, but Wednesday he could not even write down his name. Daughter also found right facial droop. EMS called. Pt sent to [facility] ED where teleneurology called and recommend [facility] for stroke work up and MRI spinal cord. Not tPA candidate given outside window.  MRI brain no infarct and MRI T/L spine unremarkable. Pt did have chronic LBP but this time no back pain or neck pain. During admission, he had oral secretion and cough, CXR  can not rule out left basilar infection. Put on empiric unasyn IV.   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 However, his right arm constant swollen which has not changed much since the surgery. Still has pain with movement and not weight bearing now.   Time last known well: last Monday night tPA Given: No: outside window and on xarelto MRS:  3-4 NIHSS:  11 on intial eval in ED  Past Medical History:  Diagnosis Date  . Acute GI bleeding 01/19/13   most likely due to diverticulosis  . BPH (benign prostatic hypertrophy) with urinary obstruction   . Bradycardia   . Diverticulosis   . Elevated hemoglobin A1c   . Hyperglycemia   . Hyperlipidemia   . Hypertension   . Internal hemorrhoids with bleeding and fecal soiling 11/19/2014  . Rectal bleed 01/19/13  . Vitamin D deficiency      Past Surgical History:  Procedure Laterality Date  . ANAL FISSURE REPAIR  1980  . HEMORRHOID BANDING    . ORIF HUMERUS FRACTURE Right 11/07/2019   Procedure: OPEN REDUCTION INTERNAL FIXATION (ORIF) DISTAL HUMERUS FRACTURE;  Surgeon: Justice Britain, MD;  Location: [facility] ORS;  Service: Orthopedics;  Laterality: Right;  . rectal fissure procedure  1970's  . ROTATOR CUFF REPAIR     bilateral  . TONSILLECTOMY  Family History  Problem Relation Age of Onset  . Heart disease Mother   . Heart disease Father   . Colon cancer Neg Hx      Social History:  reports that he quit smoking about 21 years ago. He has quit using smokeless tobacco. He reports current alcohol use of about 2.0 - 3.0 standard drinks of alcohol per week. He reports that he does not use drugs.  Review of Systems: A full ROS was attempted today and was able to be performed.  Systems assessed include - Constitutional, Eyes, HENT, Respiratory, Cardiovascular, Gastrointestinal, Genitourinary, Integument/breast, Hematologic/lymphatic, Musculoskeletal, Neurological,  Behavioral/Psych, Endocrine, Allergic/Immunologic - with pertinent responses as per HPI.  Allergies:  Allergies  Allergen Reactions  . Atenolol Other (See Comments)    Caused heart rate to drop Other reaction(s): Other Caused heart rate to drop  . Morphine And Related Other (See Comments)    hallucinations  . Trazamine [Trazodone & Diet Manage Prod] Other (See Comments)    Dry mouth    Medications: I have reviewed the patient's current medications.  Test Results: CBC:  Recent Labs  Lab 01/07/20 2153  WBC 6.8  NEUTROABS 4.1  HGB 14.4  HCT 42.8  MCV 105.2*  PLT 833   Basic Metabolic Panel:  Recent Labs  Lab 01/07/20 2153  NA 139  K 3.8  CL 101  CO2 28  GLUCOSE 100*  BUN 12  CREATININE 0.97  CALCIUM 9.0   Liver Function Tests: Recent Labs  Lab 01/07/20 2153  AST 34  ALT 27  ALKPHOS 94  BILITOT 0.7  PROT 6.5  ALBUMIN 3.6   Urinalysis:  Recent Labs  Lab 01/08/20 0030  COLORURINE AMBER*  LABSPEC 1.016  PHURINE 5.0  GLUCOSEU NEGATIVE  HGBUR NEGATIVE  BILIRUBINUR NEGATIVE  KETONESUR 5*  PROTEINUR NEGATIVE  NITRITE NEGATIVE  LEUKOCYTESUR NEGATIVE   Microbiology:  Results for orders placed or performed during the hospital encounter of 01/07/20  Resp Panel by RT-PCR (Flu A&B, Covid) Nasopharyngeal Swab     Status: None   Collection Time: 01/07/20 11:25 PM   Specimen: Nasopharyngeal Swab; Nasopharyngeal(NP) swabs in vial transport medium  Result Value Ref Range Status   SARS Coronavirus 2 by RT PCR NEGATIVE NEGATIVE Final    Comment: (NOTE) SARS-CoV-2 target nucleic acids are NOT DETECTED.  The SARS-CoV-2 RNA is generally detectable in upper respiratory specimens during the acute phase of infection. The lowest concentration of SARS-CoV-2 viral copies this assay can detect is 138 copies/mL. A negative result does not preclude SARS-Cov-2 infection and should not be used as the sole basis for treatment or other patient management decisions. A  negative result may occur with  improper specimen collection/handling, submission of specimen other than nasopharyngeal swab, presence of viral mutation(s) within the areas targeted by this assay, and inadequate number of viral copies(<138 copies/mL). A negative result must be combined with clinical observations, patient history, and epidemiological information. The expected result is Negative.  Fact Sheet for Patients:  EntrepreneurPulse.com.au  Fact Sheet for Healthcare Providers:  IncredibleEmployment.be  This test is no t yet approved or cleared by the [place] FDA and  has been authorized for detection and/or diagnosis of SARS-CoV-2 by FDA under an Emergency Use Authorization (EUA). This EUA will remain  in effect (meaning this test can be used) for the duration of the COVID-19 declaration under Section 564(b)(1) of the Act, 21 U.S.C.section 360bbb-3(b)(1), unless the authorization is terminated  or revoked sooner.  Influenza A by PCR NEGATIVE NEGATIVE Final   Influenza B by PCR NEGATIVE NEGATIVE Final    Comment: (NOTE) The Xpert Xpress SARS-CoV-2/FLU/RSV plus assay is intended as an aid in the diagnosis of influenza from Nasopharyngeal swab specimens and should not be used as a sole basis for treatment. Nasal washings and aspirates are unacceptable for Xpert Xpress SARS-CoV-2/FLU/RSV testing.  Fact Sheet for Patients: EntrepreneurPulse.com.au  Fact Sheet for Healthcare Providers: IncredibleEmployment.be  This test is not yet approved or cleared by the [place] FDA and has been authorized for detection and/or diagnosis of SARS-CoV-2 by FDA under an Emergency Use Authorization (EUA). This EUA will remain in effect (meaning this test can be used) for the duration of the COVID-19 declaration under Section 564(b)(1) of the Act, 21 U.S.C. section 360bbb-3(b)(1), unless the authorization  is terminated or revoked.  Performed at [facility], [place] [address], [place], [place] [address]    Lipid Panel:     Component Value Date/Time   CHOL 140 12/30/2013 1711   TRIG 119 12/30/2013 1711   HDL 39 (L) 12/30/2013 1711   CHOLHDL 3.6 12/30/2013 1711   VLDL 24 12/30/2013 1711   LDLCALC 77 12/30/2013 1711   HgbA1c:  Lab Results  Component Value Date   HGBA1C 4.9 01/09/2020   Urine Drug Screen: No results found for: LABOPIA, COCAINSCRNUR, LABBENZ, AMPHETMU, THCU, LABBARB  Alcohol Level: No results for input(s): ETH in the last 168 hours.  CT ANGIO HEAD W OR WO CONTRAST  Result Date: 01/08/2020 CLINICAL DATA:  Leg weakness EXAM: CT ANGIOGRAPHY HEAD AND NECK TECHNIQUE: Multidetector CT imaging of the head and neck was performed using the standard protocol during bolus administration of intravenous contrast. Multiplanar CT image reconstructions and MIPs were obtained to evaluate the vascular anatomy. Carotid stenosis measurements (when applicable) are obtained utilizing NASCET criteria, using the distal internal carotid diameter as the denominator. CONTRAST:  63mL OMNIPAQUE IOHEXOL 350 MG/ML SOLN COMPARISON:  01/07/2020 CT head FINDINGS: CT HEAD Brain: There is no acute intracranial hemorrhage, mass effect, or edema. No new loss of gray-white differentiation. There is no extra-axial fluid collection. Patchy and confluent areas of hypoattenuation in the supratentorial white matter likely reflects stable chronic microvascular ischemic changes. 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 Ventricles and sulci are stable in size and configuration. Vascular: There is atherosclerotic calcification at the skull base. Skull: Calvarium is unremarkable. Sinuses/Orbits: No acute finding. Other: None. Review of the MIP images confirms the above findings CTA NECK Aortic arch: Moderate calcified and noncalcified plaque along the arch and at the great vessel origins, which are  patent. No high-grade subclavian artery stenosis. Right carotid system: Patent. Mixed plaque along the common carotid causing less than 50% stenosis. Mixed plaque along the proximal internal carotid causing up to 60% stenosis. Left carotid system: Patent. Mixed plaque along the common carotid causing less than 50% stenosis. Mixed plaque along the proximal internal carotid causing less than 50% stenosis. Vertebral arteries: Patent. Right vertebral artery is dominant. Multifocal irregularity and stenosis the left including severe stenosis at the V1-V2 junction. Skeleton: Degenerative changes of the included spine. Other neck: No mass or adenopathy. Upper chest: Emphysema. Review of the MIP images confirms the above findings CTA HEAD Anterior circulation: Intracranial internal carotid arteries are patent with primarily calcified plaque resulting in up to moderate stenosis. Anterior and middle cerebral arteries are patent. Posterior circulation: The intracranial vertebral arteries are patent. There is mild plaque on the right. There is  high-grade stenosis or occlusion of the mid left vertebral artery just beyond the PICA origin. Reconstitution distally. Basilar artery is patent. Posterior cerebral arteries are patent. Venous sinuses: Patent as allowed by contrast bolus timing. Review of the MIP images confirms the above findings IMPRESSION: No acute intracranial abnormality. Plaque along the proximal right internal carotid causes up to 60% stenosis. Plaque along the proximal left internal carotid causes less than 50% stenosis. Multifocal left vertebral artery stenosis including severe stenosis at the V1-V2 junction. Up to moderate stenosis of the intracranial intra carotid arteries. High-grade stenosis or occlusion of the mid left intracranial vertebral artery just beyond the PICA origin with distal reconstitution. Electronically Signed   By: Macy Mis M.D.   On: 01/08/2020 10:37   CT Head Wo Contrast  Result  Date: 01/07/2020 CLINICAL DATA:  Daughter is in town to take care of patient. States weakness started on Sunday. Multiple episodes when she has had to lower him to the floor. Pt denies fall or injury. EMS has been to the house multiple time to assist with lifting patient from the floor. Pt is leaning to the left side. Equal grip strength bilaterally. No facial droop or altered sensation. " EXAM: CT HEAD WITHOUT CONTRAST TECHNIQUE: Contiguous axial images were obtained from the base of the skull through the vertex without intravenous contrast. COMPARISON:  11/12/2019 FINDINGS: Brain: No evidence of acute infarction, hemorrhage, hydrocephalus, extra-axial collection or mass lesion/mass effect. There is ventricular sulcal enlargement reflecting mild to moderate diffuse atrophy. Patchy bilateral white matter hypoattenuation is also noted consistent with moderate chronic microvascular ischemic change. These findings are stable. Vascular: No hyperdense vessel or unexpected calcification. Skull: Normal. Negative for fracture or focal lesion. Sinuses/Orbits: Globes and orbits are unremarkable. Sinuses and mastoid air cells are clear. Other: None. IMPRESSION: 1. No acute intracranial abnormalities. 2. Atrophy and chronic microvascular ischemic change. Stable appearance from the prior head CT. Electronically Signed   By: Lajean Manes M.D.   On: 01/07/2020 20:51   CT ANGIO NECK W OR WO CONTRAST  Result Date: 01/08/2020 CLINICAL DATA:  Leg weakness EXAM: CT ANGIOGRAPHY HEAD AND NECK TECHNIQUE: Multidetector CT imaging of the head and neck was performed using the standard protocol during bolus administration of intravenous contrast. Multiplanar CT image reconstructions and MIPs were obtained to evaluate the vascular anatomy. Carotid stenosis measurements (when applicable) are obtained utilizing NASCET criteria, using the distal internal carotid diameter as the denominator. CONTRAST:  61mL OMNIPAQUE IOHEXOL 350 MG/ML SOLN  COMPARISON:  01/07/2020 CT head FINDINGS: CT HEAD Brain: There is no acute intracranial hemorrhage, mass effect, or edema. No new loss of gray-white differentiation. There is no extra-axial fluid collection. Patchy and confluent areas of hypoattenuation in the supratentorial white matter likely reflects stable chronic microvascular ischemic changes. 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 Ventricles and sulci are stable in size and configuration. Vascular: There is atherosclerotic calcification at the skull base. Skull: Calvarium is unremarkable. Sinuses/Orbits: No acute finding. Other: None. Review of the MIP images confirms the above findings CTA NECK Aortic arch: Moderate calcified and noncalcified plaque along the arch and at the great vessel origins, which are patent. No high-grade subclavian artery stenosis. Right carotid system: Patent. Mixed plaque along the common carotid causing less than 50% stenosis. Mixed plaque along the proximal internal carotid causing up to 60% stenosis. Left carotid system: Patent. Mixed plaque along the common carotid causing less than 50% stenosis. Mixed plaque along the proximal internal carotid causing less than  50% stenosis. Vertebral arteries: Patent. Right vertebral artery is dominant. Multifocal irregularity and stenosis the left including severe stenosis at the V1-V2 junction. Skeleton: Degenerative changes of the included spine. Other neck: No mass or adenopathy. Upper chest: Emphysema. Review of the MIP images confirms the above findings CTA HEAD Anterior circulation: Intracranial internal carotid arteries are patent with primarily calcified plaque resulting in up to moderate stenosis. Anterior and middle cerebral arteries are patent. Posterior circulation: The intracranial vertebral arteries are patent. There is mild plaque on the right. There is high-grade stenosis or occlusion of the mid left vertebral artery just beyond the PICA origin. Reconstitution  distally. Basilar artery is patent. Posterior cerebral arteries are patent. Venous sinuses: Patent as allowed by contrast bolus timing. Review of the MIP images confirms the above findings IMPRESSION: No acute intracranial abnormality. Plaque along the proximal right internal carotid causes up to 60% stenosis. Plaque along the proximal left internal carotid causes less than 50% stenosis. Multifocal left vertebral artery stenosis including severe stenosis at the V1-V2 junction. Up to moderate stenosis of the intracranial intra carotid arteries. High-grade stenosis or occlusion of the mid left intracranial vertebral artery just beyond the PICA origin with distal reconstitution. Electronically Signed   By: Macy Mis M.D.   On: 01/08/2020 10:37   MR BRAIN WO CONTRAST  Result Date: 01/08/2020 CLINICAL DATA:  Confusion and worsening right lower extremity weakness with ataxia. EXAM: MRI HEAD WITHOUT CONTRAST TECHNIQUE: Multiplanar, multiecho pulse sequences of the brain and surrounding structures were obtained without intravenous contrast. COMPARISON:  Head CT 01/07/2020 FINDINGS: Brain: No acute infarction, hemorrhage, hydrocephalus, extra-axial collection or mass lesion. Chronic small vessel ischemia in the periventricular white matter, mild for age. Cerebral volume loss which is also generalized and mild for age. Vascular: Normal flow voids Skull and upper cervical spine: Normal marrow signal Sinuses/Orbits: Negative IMPRESSION: Senescent changes without acute or reversible finding. Electronically Signed   By: Monte Fantasia M.D.   On: 01/08/2020 06:30   MR THORACIC SPINE WO CONTRAST  Result Date: 01/08/2020 CLINICAL DATA:  Cord compression, a tracks E a. Nontraumatic. Worsening right lower extremity weakness and ataxia. EXAM: MRI THORACIC SPINE WITHOUT CONTRAST TECHNIQUE: Multiplanar, multisequence MR imaging of the thoracic spine was performed. No intravenous contrast was administered. COMPARISON:  Chest  CT from 11/10/2019 FINDINGS: Alignment:  Negative for listhesis. Vertebrae: No fracture, evidence of discitis, or bone lesion. Cord:  Normal signal and morphology. Paraspinal and other soft tissues: Marked fatty atrophy of intrinsic back muscles throughout the thoracic spine. Disc levels: Generalized disc desiccation and narrowing. Spondylitic spurring throughout the mid to lower thoracic spine. Negative facets. No neural compression. IMPRESSION: 1. No acute finding.  Negative MRI of the thoracic cord. 2. Generalized thoracic spondylosis. 3. Prominent axial muscular atrophy. Electronically Signed   By: Monte Fantasia M.D.   On: 01/08/2020 07:06   MR LUMBAR SPINE WO CONTRAST  Result Date: 01/08/2020 CLINICAL DATA:  Lumbar radiculopathy. Worse on right lower extremity weakness and a core slight EXAM: MRI LUMBAR SPINE WITHOUT CONTRAST TECHNIQUE: Multiplanar, multisequence MR imaging of the lumbar spine was performed. No intravenous contrast was administered. COMPARISON:  Lumbar myelogram 8 chorion FINDINGS: Segmentation:  Transitional S1 vertebra. Alignment:  Levoscoliosis and slight L5-S1 retrolisthesis Vertebrae:  No fracture, evidence of discitis, or bone lesion. Conus medullaris and cauda equina: Conus extends to the T12-L1 level. Conus and cauda equina appear normal. Paraspinal and other soft tissues: Prominent fatty atrophy of intrinsic back muscles CT Disc levels: L1-L2:  Mild spondylosis with bridging osteophyte. L2-L3: Spondylosis.  No impingement L3-L4: Spondylosis with ventral spurring.  No neural impingement L4-L5: . For disc narrowing back bulky left far-lateral marginal spurring. No neural compression. L5-S1:Disc narrowing with bulky left far-lateral spurring. The annulus is fissured posteriorly and left lateral. Asymmetric left facet spurring. Noncompressive left foraminal narrowing. S1-2: Asymmetric left facet spurring. Left far-lateral spurring. No neural compression. IMPRESSION: 1. No acute  finding. No neural compression on the symptomatic right side. 2. Spondylosis and degenerative disease with scoliosis that is very similar to 2014. 3. Prominent fatty atrophy of intrinsic back muscles. 4. Transitional S1 vertebra. Electronically Signed   By: Monte Fantasia M.D.   On: 01/08/2020 07:22   DG CHEST PORT 1 VIEW  Result Date: 01/08/2020 CLINICAL DATA:  86 year old male with cerebrovascular accident presenting with confusion and RIGHT lower extremity weakness and ataxia EXAM: PORTABLE CHEST 1 VIEW COMPARISON:  November 10, 2019 FINDINGS: Image rotated to the LEFT. Accounting for this cardiomediastinal contours are stable. The LEFT basilar airspace disease with partial obscuration of LEFT hemidiaphragm versus is accentuation of cardiac fat pad due to rotation projecting over this area. RIGHT lung is clear. On limited assessment no acute skeletal process. IMPRESSION: LEFT basilar airspace disease versus accentuation of cardiac fat pad. Could represent developing infection. Continued follow-up radiographs without rotation may be helpful to exclude the possibility of developing infection in this location. Electronically Signed   By: Zetta Bills M.D.   On: 01/08/2020 09:28    EKG: normal sinus rhythm.   Physical Examination: Temp:  [98.1 F (36.7 C)-99.7 F (37.6 C)] 98.2 F (36.8 C) (12/03 1534) Pulse Rate:  [60-75] 75 (12/03 1534) Resp:  [14-22] 19 (12/03 1534) BP: (120-168)/(52-78) 135/52 (12/03 1534) SpO2:  [94 %-100 %] 94 % (12/03 1534)  General - Well nourished, well developed, in no apparent distress, intermittent coughing with difficulty clear phelm.  Ophthalmologic - fundi not visualized due to noncooperation.  Cardiovascular - Regular rate and rhythm.  Neuro - awake alert, orientated to place and people and month, but not orientated to age and year. No aphasia, but paucity of speech and moderate dysarthria.  Able to follow all simple commands, able to name and repeat.  Visual  field full, no gaze palsy.  PERRL.  Right nasolabial fold flattening.  Tongue midline.  Left upper extremity at least 4/5, right upper extremity 3-/5 and bicep 3/5, finger grip 3/5 due to recent humeral fracture status post surgery.  Left lower extremity proximal 4/5 and distal 5/5, right lower extremity proximal 3/5 and distal 4+/5.  Sensation symmetrical.  Finger-to-nose grossly intact on the left.  Gait not tested.  Assessment:  Michael Ware is a 86 y.o. male with history of bradycardia, HTN, HLD presenting with BLE weakness, confusion and R facial droop. He was not considered for IV t-PA due to being on xarelto. MRI recommended given cognitive deficits.   ? Left brain subcortical lacunar infarct  CT head No acute abnormality. Small vessel disease. Atrophy.   MRI  No acute abnormality.   CTA head & neck No acute abnormality. R ICA 60%. Proximal L ICA <50%. On my review, no significant ICA stenosis bilaterally. Multifocal L VA stenosis w/ severe stenosis V1-2 jxn. Moderate intracranial stenosis w/ high-grade stenosis/ occlusion mid L intracranial VA beyond PICA w/ distal reconstitution.  MRI T/L spine unremarkable  Repeat MRI DWI pending  MRI C spine pending   2D Echo 10/2019 EF 70-75%. No source of embolus  LDL pending   HgbA1c 4.9  VTE prophylaxis - xarelto  Xarelto (rivaroxaban) daily prior to admission, now on Xarelto (rivaroxaban) daily.   Therapy recommendations:  SNF   Disposition:  pending   Acute hypoxemic respiratory failure Encephalopathy due to ? Aspiration penumonia  desat on arrival   CXR ? Left basilar infection  Repeat CXR pending  On unasyn  No leukocytosis  UA pending  Ammonia pending  Hx of DVT/PE Recent left humeral fx   On Xarelto  Mild left arm pain  Continue Xarelto  Hypertension  BP low at times yesterday, now Stable  Avoid low BP  . BP goal normotensive . Encourage po intake  Hyperlipidemia  Home meds:  pravachol 40,  resumed in hospital  LDL pending, goal < 70  Continue statin at discharge  Other Stroke Risk Factors  Advanced age  Former Cigarette smoker, quit 21 yrs ago  Occasional ETOH use  Other Active Problems  Hx delirium d/t polypharmacy - last admission with morphine  Anxiety on prozac  Chronic DDD  Macrocytosis   Hospital day # 0   I had long discussion with daughter at bedside, updated pt current condition, treatment plan and potential prognosis, and answered all the questions. She expressed understanding and appreciation. Will follow   Rosalin Hawking, MD PhD Stroke Neurology 01/09/2020 7:23 PM    To contact Stroke Continuity provider, please refer to [email or web address] After hours, contact General Neurology

## 2020-01-09 NOTE — Progress Notes (Signed)
Modified Barium Swallow Progress Note  Patient Details  Name: Michael Ware MRN: [id number] Date of Birth: Jan 17, 1932  Today's Date: 01/09/2020  Modified Barium Swallow completed.  Full report located under Chart Review in the Imaging Section.  Brief recommendations include the following:  Clinical Impression  Pt exhibited mild oropharyngeal dysphagia with trace penetration. Decreased oral containment with right labial leakage and reduced labial seal on cup. Pt's hyolaryngeal elevation, ROM and timing of protective mechanisms was adequate. While pt is masticating the cracker moves over tongue filling vallecuale and pyriform sinuses and pt distracted by dental/oral residue attempting to remove and not initiating swallow. When requested to initiate swallow sooner he was unable but when reflexive swallow was triggered, pharyngeal residue from regular texture was penetrated into vestibule above vocal cords. He produced throat clear to expel majority. Several instances of throat clear/coughs without barium observed in vestibule or below cords. Puree textures allowed for easier transit without oral residue and more timelier swallow. Recommend Dys 1 (puree), thin liquids, pill whole in pudding, straws allowed, ensure swallow prior to next bite. ST will follow.    Swallow Evaluation Recommendations       SLP Diet Recommendations: Dysphagia 1 (Puree) solids;Thin liquid   Liquid Administration via: Cup;Straw   Medication Administration: Whole meds with puree   Supervision: Patient able to self feed;Staff to assist with self feeding   Compensations: Minimize environmental distractions;Slow rate;Small sips/bites;Multiple dry swallows after each bite/sip   Postural Changes: Seated upright at 90 degrees   Oral Care Recommendations: Oral care BID        Houston Siren 01/09/2020,4:33 PM

## 2020-01-09 NOTE — Plan of Care (Signed)

## 2020-01-10 ENCOUNTER — Inpatient Hospital Stay (HOSPITAL_COMMUNITY): Payer: Medicare Other

## 2020-01-10 DIAGNOSIS — G9341 Metabolic encephalopathy: Secondary | ICD-10-CM

## 2020-01-10 DIAGNOSIS — Z7901 Long term (current) use of anticoagulants: Secondary | ICD-10-CM

## 2020-01-10 DIAGNOSIS — Z86718 Personal history of other venous thrombosis and embolism: Secondary | ICD-10-CM

## 2020-01-10 DIAGNOSIS — R059 Cough, unspecified: Secondary | ICD-10-CM

## 2020-01-10 LAB — COMPREHENSIVE METABOLIC PANEL
ALT: 23 U/L (ref 0–44)
AST: 29 U/L (ref 15–41)
Albumin: 2.9 g/dL — ABNORMAL LOW (ref 3.5–5.0)
Alkaline Phosphatase: 89 U/L (ref 38–126)
Anion gap: 8 (ref 5–15)
BUN: 10 mg/dL (ref 8–23)
CO2: 29 mmol/L (ref 22–32)
Calcium: 8.8 mg/dL — ABNORMAL LOW (ref 8.9–10.3)
Chloride: 99 mmol/L (ref 98–111)
Creatinine, Ser: 1.05 mg/dL (ref 0.61–1.24)
GFR, Estimated: >60 mL/min (ref >60)
Glucose, Bld: 109 mg/dL — ABNORMAL HIGH (ref 70–99)
Potassium: 3.8 mmol/L (ref 3.5–5.1)
Sodium: 136 mmol/L (ref 135–145)
Total Bilirubin: 0.5 mg/dL (ref 0.3–1.2)
Total Protein: 5.5 g/dL — ABNORMAL LOW (ref 6.5–8.1)

## 2020-01-10 LAB — CBC
HCT: 41.3 % (ref 39.0–52.0)
Hemoglobin: 13.9 g/dL (ref 13.0–17.0)
MCH: 34.2 pg — ABNORMAL HIGH (ref 26.0–34.0)
MCHC: 33.7 g/dL (ref 30.0–36.0)
MCV: 101.5 fL — ABNORMAL HIGH (ref 80.0–100.0)
Platelets: 243 10*3/uL (ref 150–400)
RBC: 4.07 MIL/uL — ABNORMAL LOW (ref 4.22–5.81)
RDW: 12.5 % (ref 11.5–15.5)
WBC: 7 10*3/uL (ref 4.0–10.5)
nRBC: 0 % (ref 0.0–0.2)

## 2020-01-10 LAB — URINALYSIS, COMPLETE (UACMP) WITH MICROSCOPIC
Bilirubin Urine: NEGATIVE
Glucose, UA: NEGATIVE mg/dL
Ketones, ur: NEGATIVE mg/dL
Nitrite: NEGATIVE
Protein, ur: NEGATIVE mg/dL
Specific Gravity, Urine: 1.024 (ref 1.005–1.030)
pH: 5 (ref 5.0–8.0)

## 2020-01-10 LAB — LIPID PANEL
Cholesterol: 120 mg/dL (ref 0–200)
HDL: 26 mg/dL — ABNORMAL LOW (ref >40)
LDL Cholesterol: 70 mg/dL (ref 0–99)
Total CHOL/HDL Ratio: 4.6 RATIO
Triglycerides: 120 mg/dL (ref <150)
VLDL: 24 mg/dL (ref 0–40)

## 2020-01-10 LAB — AMMONIA: Ammonia: 34 umol/L (ref 9–35)

## 2020-01-10 IMAGING — DX DG CHEST 1V PORT
1 series · 1 of 1 positions shown · non-contrast
Comparison: Chest x-ray dated [DATE].

CLINICAL DATA: Weakness, cough

EXAM:
PORTABLE CHEST 1 VIEW

[chest ap]
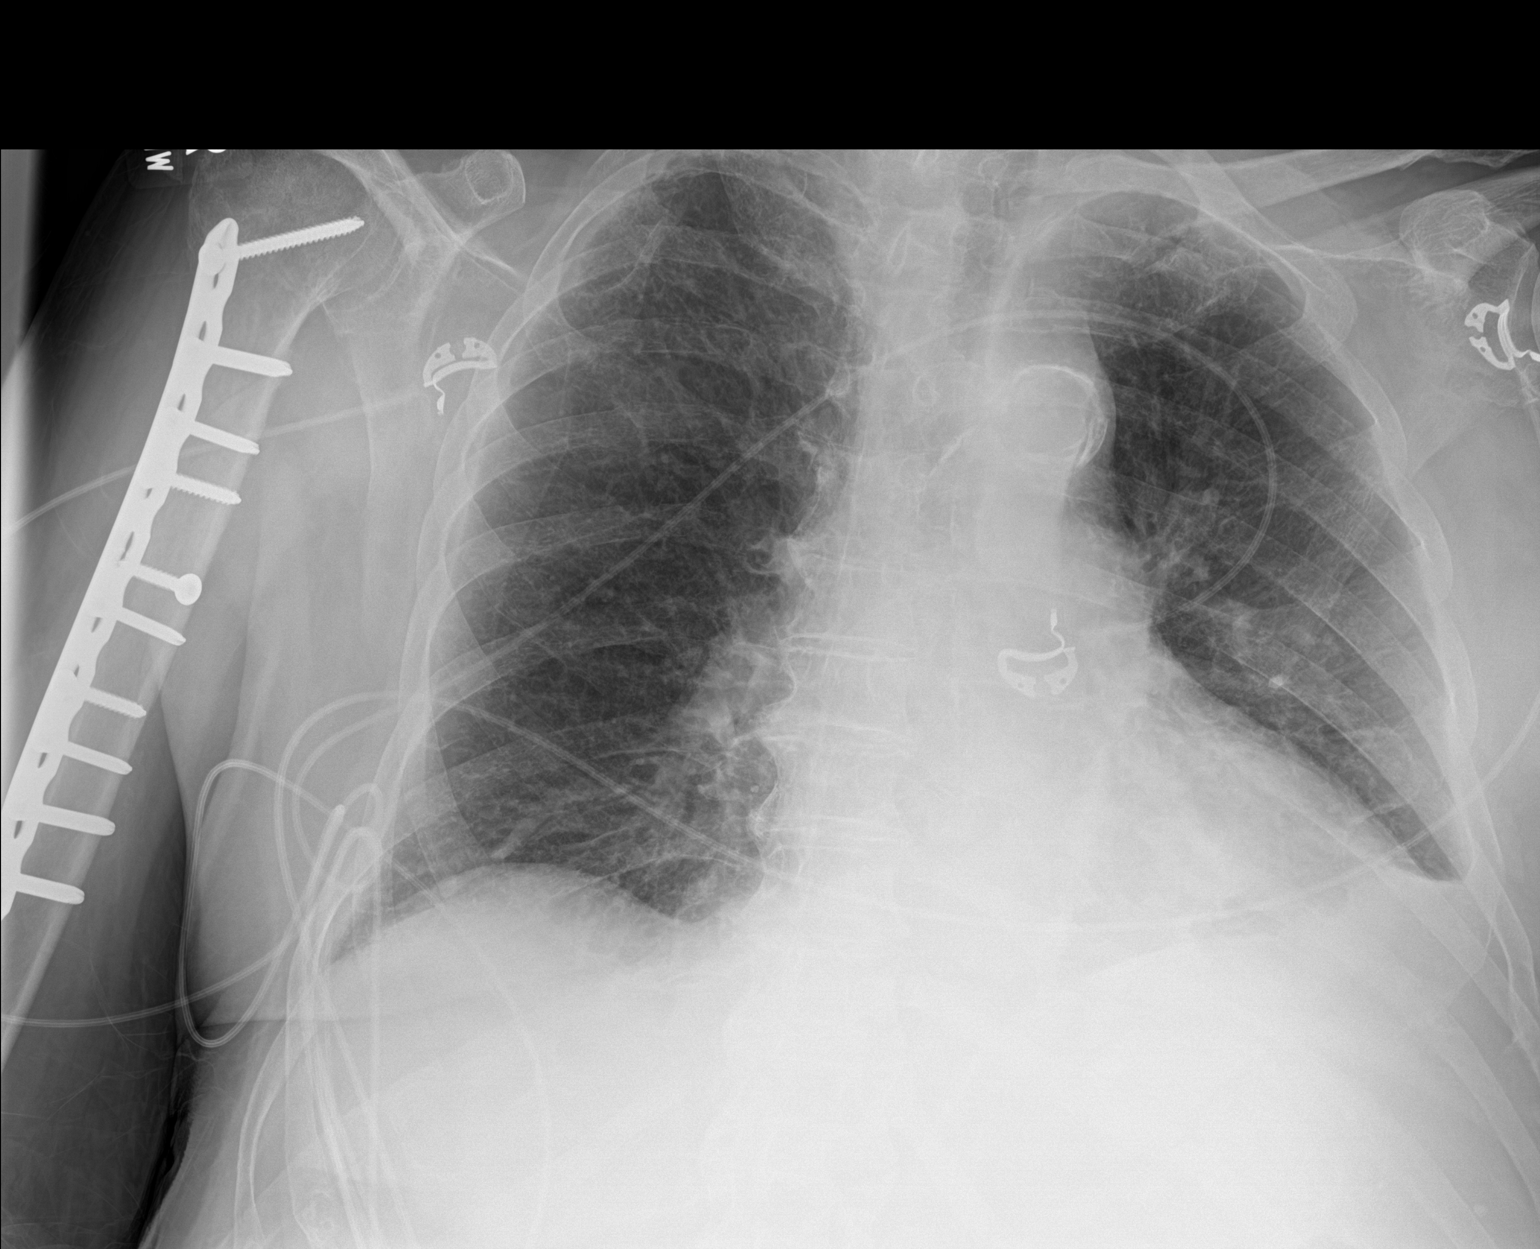

[1 of 1 positions shown; findings below may reference images not displayed]

FINDINGS: Heart size is upper normal. Persistent opacity at the LEFT
costophrenic angle. Lungs otherwise clear. No pneumothorax is seen.
IMPRESSION: LEFT basilar opacity, stable, most likely small pleural effusion
and/or atelectasis. Recommend follow-up chest x-ray to ensure
resolution.

## 2020-01-10 MED ORDER — ORAL CARE MOUTH RINSE
15.0000 mL | Freq: Two times a day (BID) | OROMUCOSAL | Status: DC
  Administered 2020-01-10 – 2020-01-12 (×6): 15 mL via OROMUCOSAL

## 2020-01-10 NOTE — TOC Initial Note (Addendum)
Transition of Care [facility]) - Initial/Assessment Note    Patient Details  Name: Michael Ware MRN: [id number] Date of Birth: 1931-02-25  Transition of Care [facility]) CM/SW Contact:    Michael Ware, [place] Phone Number: 01/10/2020, 2:42 PM  Clinical Narrative:                 CSW went to speak with patient about DC planning, he was sleeping. CSW received message that pt dtr, Michael Ware, would like an update. Michael Ware advised that pt had been to [facility] not long ago and did not receive care that she expected and would like to choose a different facility. Michael Ware noted that pt has been living at home for the last month with Michael Ware. She has a hospital bed and DME and stated that her father was seeming to progress, until he had stroke like symptoms that led to his hospitalization.  She noted that the dr. And therapists said they should see how he does over the weekend, and decide on dc planning then. Dtr advised SW that if pt could not stand or ambulate in any way, she could not care for him and he would need SNF. If pt can ambulate, then she would like to take him back home.   CSW will do workup and faxout to be prepared if SNF placement is needed. SW will continue to follow for DC planning.  Expected Discharge Plan: [place] Barriers to Discharge: Continued Medical Work up   Patient Goals and CMS Choice Patient states their goals for this hospitalization and ongoing recovery are:: Pt's daughter debating Home vs. SNF CMS Medicare.gov Compare Post Acute Care list provided to:: Patient Represenative (must comment) (Daughter Michael Ware) Choice offered to / list presented to : Patient, Adult Children  Expected Discharge Plan and Services Expected Discharge Plan: [place] In-house Referral: Clinical Social Work     Living arrangements for the past 2 months: [place]: [place]        Prior Living  Arrangements/Services Living arrangements for the past 2 months: Single Family Home Lives with:: Adult Children Patient language and need for interpreter reviewed:: Yes Do you feel safe going back to the place where you live?: Yes      Need for Family Participation in Patient Care: Yes (Comment) Care giver support system in place?: Yes (comment) Current home services: DME, Home OT, Home PT, Home RN, Homehealth aide Criminal Activity/Legal Involvement Pertinent to Current Situation/Hospitalization: No - Comment as needed  Activities of Daily Living Home Assistive Devices/Equipment: Eyeglasses, Shower chair with back, Cane (specify quad or straight), Wheelchair, Other (Comment), Grab bars in shower, Grab bars around toilet (walk-in shower, toilet riser, 2 wheeled and 4 wheeled walker, single point cane, toilet-handicapp height) ADL Screening (condition at time of admission) Patient's cognitive ability adequate to safely complete daily activities?: Yes Is the patient deaf or have difficulty hearing?: No Does the patient have difficulty seeing, even when wearing glasses/contacts?: No Does the patient have difficulty concentrating, remembering, or making decisions?: No Patient able to express need for assistance with ADLs?: Yes Does the patient have difficulty dressing or bathing?: Yes Independently performs ADLs?: No (patient having increased weakness since sunday) Communication: Independent Dressing (OT): Needs assistance Is this a change from baseline?:  Change from baseline, expected to last >3 days Grooming: Needs assistance Is this a change from baseline?: Change from baseline, expected to last >3 days Feeding: Needs assistance Is this a change from baseline?: Change from baseline, expected to last >3 days Bathing: Needs assistance Is this a change from baseline?: Change from baseline, expected to last >3 days Toileting: Dependent Is this a change from baseline?: Change from baseline,  expected to last >3days In/Out Bed: Dependent Is this a change from baseline?: Change from baseline, expected to last >3 days Walks in Home: Dependent Is this a change from baseline?: Change from baseline, expected to last >3 days Does the patient have difficulty walking or climbing stairs?: Yes (secondary to weakness) Weakness of Legs: Both Weakness of Arms/Hands: None  Permission Sought/Granted Permission sought to share information with : Family Supports    Share Information with NAME: Michael Ware     Permission granted to share info w Relationship: Daughter  Permission granted to share info w Contact Information: [phone]  Emotional Assessment Appearance:: Other (Comment Required (Unable to Assess) Attitude/Demeanor/Rapport: Unable to Assess Affect (typically observed): Unable to Assess Orientation: : Oriented to Self, Oriented to Place, Oriented to  Time, Oriented to Situation Alcohol / Substance Use: Not Applicable Psych Involvement: No (comment)  Admission diagnosis:  TIA (transient ischemic attack) [G45.9] Weakness [R53.1] Lower extremity weakness [O27.035] Acute metabolic encephalopathy [K09.38] Patient Active Problem List   Diagnosis Date Noted  . Acute metabolic encephalopathy 18/29/9371  . Lower extremity weakness 01/08/2020  . TIA (transient ischemic attack) 01/08/2020  . Acute deep vein thrombosis (DVT) of left peroneal vein (HCC)   . COPD with acute exacerbation ([place])   . Pulmonary emboli ([place]) 11/10/2019  . Hypoxia   . Acute pain of left lower extremity 11/08/2019  . Rib pain on right side 11/08/2019  . Dehydration   . Bradycardia   . Near syncope   . Depression   . Lactic acidosis   . AKI (acute kidney injury) ([place])   . Hyperlipidemia   . Hypotension   . Humerus fracture 11/04/2019  . Internal hemorrhoids with bleeding and fecal soiling 11/19/2014  . Internal hemorrhoids 10/22/2014  . Medication management 12/30/2013  . Sinus bradycardia  02/16/2013  . Rectal bleeding 01/19/2013  . Routine general medical examination at a health care facility 12/24/2012  . Essential hypertension 12/24/2012  . Mixed hyperlipidemia 12/24/2012  . Abnormal glucose 12/24/2012  . ADD (attention deficit disorder)   . Vitamin D deficiency   . Elevated hemoglobin A1c   . BPH (benign prostatic hypertrophy) with urinary obstruction   . Diverticulosis of colon 01/23/2011   PCP:  Alroy Dust, L.Marlou Sa, MD Pharmacy:   [facility] [address] [place] - [address] [place] [address] Phone: [phone] Fax: [phone]     Social Determinants of Health (SDOH) Interventions    Readmission Risk Interventions No flowsheet data found.

## 2020-01-10 NOTE — Progress Notes (Signed)
  Speech Language Pathology Treatment: Dysphagia  Patient Details Name: Michael Ware MRN: [id number] DOB: 07/21/1931 Today's Date: 01/10/2020 Time: 5537-4827 SLP Time Calculation (min) (ACUTE ONLY): 35 min  Assessment / Plan / Recommendation Clinical Impression  Pt seen for skilled treatment of swallowing with intake of Dysphagia 1/thin liquids with oral retention/left oral loss/spillage, delayed throat clearing/cough and wet vocal quality noted during consumption with min verbal/visual cues provided by SLP.  Effortful swallow, throat clearing/cough and repetitive dry swallow assisted in improving vocal quality during intake.  Educated daughter/pt re: vocal quality during PO intake and need for strategies when vocal quality deteriorates.  Plan is to upgrade diet when pt is safe to progress diet from a cognitive and functional standpoint.  Continue Dysphagia 1/thin liquid diet with swallowing precautions in place.  ST will continue efforts.  HPI HPI: 86 y.o. male with medical history significant of bradycardia, HTN, HLD. Presenting with BLE weakness, confusion, and right facial droop.  History per daughter. Has been working with PT at home for several weeks and was doing well. In normal state of health until Monday morning. On Monday morning he seemed to have a harder time with his exercises but generally ok. Tuesday morning, he called for help to get out of bed and his legs "were like noodles".MRI Senescent changes without acute or reversible finding.       SLP Plan  Continue with current plan of care  Patient needs continued Speech Language Pathology Services    Recommendations  Diet recommendations: Dysphagia 1 (puree);Thin liquid Liquids provided via: Cup Medication Administration: Whole meds with liquid Supervision: Patient able to self feed;Staff to assist with self feeding;Intermittent supervision to cue for compensatory strategies Compensations: Minimize environmental  distractions;Slow rate;Small sips/bites;Multiple dry swallows after each bite/sip;Clear throat intermittently;Effortful swallow                Oral Care Recommendations: Oral care BID Follow up Recommendations: Other (comment) (TBD) SLP Visit Diagnosis: Dysphagia, oropharyngeal phase (R13.12);Cognitive communication deficit (M78.675) Plan: Continue with current plan of care                      Elvina Sidle, M.S., CCC-SLP 01/10/2020, 1:42 PM

## 2020-01-10 NOTE — Progress Notes (Addendum)
PROGRESS NOTE    [name]  GGE:[id number] DOB: August 31, 1931 DOA: 01/07/2020 PCP: Alroy Dust, L.Marlou Sa, MD     Brief Narrative:  Michael Ware is a 86 y.o. male with medical history significant of bradycardia, HTN, HLD, PE, DVT, history of hospital delirium due to polypharmacy. He presented with BLE weakness, confusion, and right facial droop.  History per daughter. Has been working with PT at home for several weeks and was doing well. In normal state of health until Monday morning. On Monday morning he seemed to have a harder time with his exercises but generally ok. Tuesday morning, he called for help to get out of bed and his legs "were like noodles". Daughter helped him to the bathroom and back. When she got him to the bed, she noticed that he didn't have the strength to get back into the bed. PT noticed that he was weaker that day. The weakness progressed over the next day. They spoke with his PCP who recommended that he go to walk-in clinic or ED yesterday. The daughter noted that he was having a til to the left. They went to walk-in clinic and they recommended an MRI lumbar spine. They went home and the daughter noticed that he was having difficulty writing and he was more confused. She did what she knew for a stroke test: asked him to speak and smile. She said his speech seemed slurred but his smile was symmetrical. She became concerned and had him transported to the ED.    New events last 24 hours / Subjective: Patient is alert and oriented to self, place and year.  He is able to tell me that he came to the hospital due to his ambulation issues.  Assessment & Plan:   Active Problems:   Lower extremity weakness   TIA (transient ischemic attack)   Acute metabolic encephalopathy   Acute metabolic encephalopathy and LE weakness likely secondary to pneumonia -Presented with lower extremity weakness, leaning to the left, slurred speech and right facial droop -CT head without acute  intracranial abnormality -MRI brain negative -CTA head and neck without acute intracranial abnormality -MRI thoracic lumbar spine negative for acute finding -Repeat MRI brain negative -MRI cervical spine with findings of moderate to severe neural foraminal stenosis -Neurology work-up has largely been unremarkable.  His symptoms are likely secondary to infectious process -Stroke team following -PT OT recommending SNF placement  Acute hypoxemic respiratory failure secondary to pneumonia -SPO2 86% on room air, currently requiring 2 L nasal cannula O2 -Chest x-ray revealed left basilar airspace disease versus accentuation of cardiac fat pad, could represent developing infection -Repeat chest x-ray showed left basilar opacity -Unasyn for possible aspiration pneumonia  History of hospital delirium and confusion due to polypharmacy -Limit narcotic use. Has been doing well on tramadol as needed. Has been taking Valium as needed nightly for years.  History of PE, DVT -Continue Xarelto  Hyperlipidemia -Continue pravachol  Anxiety -Continue Prozac  In agreement with assessment of the pressure ulcer as below:  Pressure Injury 01/08/20 Sacrum Medial (Active)  01/08/20 2300  Location: Sacrum  Location Orientation: Medial  Staging:   Wound Description (Comments):   Present on Admission: Yes         DVT prophylaxis:   rivaroxaban (XARELTO) tablet 20 mg  Code Status: DNR Family Communication: Daughter at bedside Disposition Plan:  Status is: Inpatient  Remains inpatient appropriate because:Unsafe d/c plan   Dispo: The patient is from: Home  Anticipated d/c is to: SNF              Anticipated d/c date is: 2 days              Patient currently is not medically stable to d/c. Remains on IV antibiotics. SNF placement pending.    Consultants:   Neurology  Procedures:   None  Antimicrobials:  Anti-infectives (From admission, onward)   Start     Dose/Rate  Route Frequency Ordered Stop   01/09/20 1400  Ampicillin-Sulbactam (UNASYN) 3 g in sodium chloride 0.9 % 100 mL IVPB        3 g 200 mL/hr over 30 Minutes Intravenous Every 8 hours 01/09/20 1253         Objective: Vitals:   01/09/20 2103 01/10/20 0334 01/10/20 0546 01/10/20 0800  BP: (!) 120/58 (!) 131/59 (!) 116/54 (!) 142/55  Pulse: 79 (!) 58 (!) 58 (!) 59  Resp: 18 18 18 18   Temp: 98.9 F (37.2 C) (!) 97.5 F (36.4 C) 98.2 F (36.8 C) 99 F (37.2 C)  TempSrc: Oral Oral Oral   SpO2: 94% 96% 96% 94%    Intake/Output Summary (Last 24 hours) at 01/10/2020 1032 Last data filed at 01/09/2020 2105 Gross per 24 hour  Intake 135.57 ml  Output 350 ml  Net -214.43 ml   There were no vitals filed for this visit.  Examination: General exam: Appears calm and comfortable  Respiratory system: Clear to auscultation anteriorly. Respiratory effort normal.  On nasal cannula O2 Cardiovascular system: S1 & S2 heard, RRR. No pedal edema. Gastrointestinal system: Abdomen is nondistended, soft and nontender. Normal bowel sounds heard. Central nervous system: Alert and oriented to self, place, year. Speech clear  Extremities: Symmetric in appearance bilaterally  Psychiatry: Stable   Data Reviewed: I have personally reviewed following labs and imaging studies  CBC: Recent Labs  Lab 01/07/20 2153 01/10/20 0113  WBC 6.8 7.0  NEUTROABS 4.1  --   HGB 14.4 13.9  HCT 42.8 41.3  MCV 105.2* 101.5*  PLT 245 194   Basic Metabolic Panel: Recent Labs  Lab 01/07/20 2153 01/10/20 0113  NA 139 136  K 3.8 3.8  CL 101 99  CO2 28 29  GLUCOSE 100* 109*  BUN 12 10  CREATININE 0.97 1.05  CALCIUM 9.0 8.8*   GFR: CrCl cannot be calculated (Unknown ideal weight.). Liver Function Tests: Recent Labs  Lab 01/07/20 2153 01/10/20 0113  AST 34 29  ALT 27 23  ALKPHOS 94 89  BILITOT 0.7 0.5  PROT 6.5 5.5*  ALBUMIN 3.6 2.9*   No results for input(s): LIPASE, AMYLASE in the last 168  hours. Recent Labs  Lab 01/10/20 0113  AMMONIA 34   Coagulation Profile: No results for input(s): INR, PROTIME in the last 168 hours. Cardiac Enzymes: No results for input(s): CKTOTAL, CKMB, CKMBINDEX, TROPONINI in the last 168 hours. BNP (last 3 results) No results for input(s): PROBNP in the last 8760 hours. HbA1C: Recent Labs    01/09/20 1222  HGBA1C 4.9   CBG: Recent Labs  Lab 01/09/20 2111  GLUCAP 133*   Lipid Profile: Recent Labs    01/10/20 0113  CHOL 120  HDL 26*  LDLCALC 70  TRIG 120  CHOLHDL 4.6   Thyroid Function Tests: No results for input(s): TSH, T4TOTAL, FREET4, T3FREE, THYROIDAB in the last 72 hours. Anemia Panel: Recent Labs    01/08/20 1157 01/08/20 1158  VITAMINB12 766  --   FOLATE  --  17.0   Sepsis Labs: Recent Labs  Lab 01/07/20 2153  LATICACIDVEN 1.2    Recent Results (from the past 240 hour(s))  Resp Panel by RT-PCR (Flu A&B, Covid) Nasopharyngeal Swab     Status: None   Collection Time: 01/07/20 11:25 PM   Specimen: Nasopharyngeal Swab; Nasopharyngeal(NP) swabs in vial transport medium  Result Value Ref Range Status   SARS Coronavirus 2 by RT PCR NEGATIVE NEGATIVE Final    Comment: (NOTE) SARS-CoV-2 target nucleic acids are NOT DETECTED.  The SARS-CoV-2 RNA is generally detectable in upper respiratory specimens during the acute phase of infection. The lowest concentration of SARS-CoV-2 viral copies this assay can detect is 138 copies/mL. A negative result does not preclude SARS-Cov-2 infection and should not be used as the sole basis for treatment or other patient management decisions. A negative result may occur with  improper specimen collection/handling, submission of specimen other than nasopharyngeal swab, presence of viral mutation(s) within the areas targeted by this assay, and inadequate number of viral copies(<138 copies/mL). A negative result must be combined with clinical observations, patient history, and  epidemiological information. The expected result is Negative.  Fact Sheet for Patients:  EntrepreneurPulse.com.au  Fact Sheet for Healthcare Providers:  IncredibleEmployment.be  This test is no t yet approved or cleared by the [place] FDA and  has been authorized for detection and/or diagnosis of SARS-CoV-2 by FDA under an Emergency Use Authorization (EUA). This EUA will remain  in effect (meaning this test can be used) for the duration of the COVID-19 declaration under Section 564(b)(1) of the Act, 21 U.S.C.section 360bbb-3(b)(1), unless the authorization is terminated  or revoked sooner.       Influenza A by PCR NEGATIVE NEGATIVE Final   Influenza B by PCR NEGATIVE NEGATIVE Final    Comment: (NOTE) The Xpert Xpress SARS-CoV-2/FLU/RSV plus assay is intended as an aid in the diagnosis of influenza from Nasopharyngeal swab specimens and should not be used as a sole basis for treatment. Nasal washings and aspirates are unacceptable for Xpert Xpress SARS-CoV-2/FLU/RSV testing.  Fact Sheet for Patients: EntrepreneurPulse.com.au  Fact Sheet for Healthcare Providers: IncredibleEmployment.be  This test is not yet approved or cleared by the [place] FDA and has been authorized for detection and/or diagnosis of SARS-CoV-2 by FDA under an Emergency Use Authorization (EUA). This EUA will remain in effect (meaning this test can be used) for the duration of the COVID-19 declaration under Section 564(b)(1) of the Act, 21 U.S.C. section 360bbb-3(b)(1), unless the authorization is terminated or revoked.  Performed at [facility], [place] [address], [place], [place] [address]       Radiology Studies: MR BRAIN WO CONTRAST  Result Date: 01/09/2020 CLINICAL DATA:  Stroke follow-up EXAM: MRI HEAD WITHOUT CONTRAST TECHNIQUE: Multiplanar, multiecho pulse sequences of the brain and surrounding  structures were obtained without intravenous contrast. COMPARISON:  None. FINDINGS: Diffusion-weighted imaging shows no acute infarct. There is no midline shift or other mass generalized volume loss. IMPRESSION: No acute infarct. Electronically Signed   By: Ulyses Jarred M.D.   On: 01/09/2020 21:56   MR CERVICAL SPINE WO CONTRAST  Result Date: 01/09/2020 CLINICAL DATA:  Cervical osteoarthritis EXAM: MRI CERVICAL SPINE WITHOUT CONTRAST TECHNIQUE: Multiplanar, multisequence MR imaging of the cervical spine was performed. No intravenous contrast was administered. COMPARISON:  None. FINDINGS: Alignment: Normal Vertebrae: No acute abnormality. Cord: Motion degraded examination but no visible cord abnormality. Posterior Fossa, vertebral arteries, paraspinal tissues: Abnormal left vertebral artery flow void. Disc  levels: Motion degraded axial images limit evaluation of the neural foramina. C2-3: No spinal canal or neural foraminal stenosis. C3-4: Large right subarticular osteophyte. Severe right foraminal stenosis. No spinal canal stenosis. C4-5: Right uncovertebral disc osteophyte complex with moderate right foraminal stenosis. C5-6: Right-greater-than-left uncovertebral hypertrophy. Moderate right and mild left foraminal stenosis. C6-7: Disc bulge with endplate spurring no spinal canal stenosis. Mild left foraminal stenosis. C7-T1: No spinal canal or neural foraminal stenosis. IMPRESSION: 1. Motion degraded examination. 2. Severe right C3-4 neural foraminal stenosis due to large right subarticular osteophyte. 3. Moderate right C4-5 and C5-6 neural foraminal stenosis due to combination of uncovertebral hypertrophy and disc disease. 4. Abnormal left vertebral artery flow void may indicate slow flow or occlusion. Electronically Signed   By: Ulyses Jarred M.D.   On: 01/09/2020 22:13   DG CHEST PORT 1 VIEW  Result Date: 01/10/2020 CLINICAL DATA:  Weakness, cough EXAM: PORTABLE CHEST 1 VIEW COMPARISON:  Chest x-ray  dated 01/08/2020. FINDINGS: Heart size is upper normal. Persistent opacity at the LEFT costophrenic angle. Lungs otherwise clear. No pneumothorax is seen. IMPRESSION: LEFT basilar opacity, stable, most likely small pleural effusion and/or atelectasis. Recommend follow-up chest x-ray to ensure resolution. Electronically Signed   By: Franki Cabot M.D.   On: 01/10/2020 06:44   DG Swallowing Func-Speech Pathology  Result Date: 01/09/2020 Objective Swallowing Evaluation: Type of Study: MBS-Modified Barium Swallow Study  Patient Details Name: NATALE THOMA MRN: [id number] Date of Birth: August 30, 1931 Today's Date: 01/09/2020 Time: SLP Start Time (ACUTE ONLY): 1400 -SLP Stop Time (ACUTE ONLY): 1417 SLP Time Calculation (min) (ACUTE ONLY): 17 min Past Medical History: Past Medical History: Diagnosis Date . Acute GI bleeding 01/19/13  most likely due to diverticulosis . BPH (benign prostatic hypertrophy) with urinary obstruction  . Bradycardia  . Diverticulosis  . Elevated hemoglobin A1c  . Hyperglycemia  . Hyperlipidemia  . Hypertension  . Internal hemorrhoids with bleeding and fecal soiling 11/19/2014 . Rectal bleed 01/19/13 . Vitamin D deficiency  Past Surgical History: Past Surgical History: Procedure Laterality Date . ANAL FISSURE REPAIR  1980 . HEMORRHOID BANDING   . ORIF HUMERUS FRACTURE Right 11/07/2019  Procedure: OPEN REDUCTION INTERNAL FIXATION (ORIF) DISTAL HUMERUS FRACTURE;  Surgeon: Justice Britain, MD;  Location: [facility];  Service: Orthopedics;  Laterality: Right; . rectal fissure procedure  1970's . ROTATOR CUFF REPAIR    bilateral . TONSILLECTOMY   HPI: MATEUSZ NEILAN is a 86 y.o. male with medical history significant of bradycardia, HTN, HLD. Presenting with BLE weakness, confusion, and right facial droop. MRI Senescent changes without acute or reversible finding.  No data recorded Assessment / Plan / Recommendation CHL IP CLINICAL IMPRESSIONS 01/09/2020 Clinical Impression Pt exhibited mild oropharyngeal  dysphagia with trace penetration. Decreased oral containment with right labial leakage and reduced labial seal on cup. Pt's hyolaryngeal elevation, ROM and timing of protective mechanisms was adequate. While pt is masticating the cracker moves over tongue filling vallecuale and pyriform sinuses and pt distracted by dental/oral residue attempting to remove and not initiating swallow. When requested to initiate swallow sooner he was unable but when reflexive swallow was triggered, pharyngeal residue from regular texture was penetrated into vestibule above vocal cords. He produced throat clear to expel majority. Several instances of throat clear/coughs without barium observed in vestibule or below cords. Puree textures allowed for easier transit without oral residue and more timelier swallow. Recommend Dys 1 (puree), thin liquids, pill whole in pudding, straws allowed, ensure swallow prior to next bite.  ST will follow.  SLP Visit Diagnosis Dysphagia, oropharyngeal phase (R13.12) Attention and concentration deficit following -- Frontal lobe and executive function deficit following -- Impact on safety and function Mild aspiration risk;Moderate aspiration risk   CHL IP TREATMENT RECOMMENDATION 01/09/2020 Treatment Recommendations Therapy as outlined in treatment plan below   Prognosis 01/09/2020 Prognosis for Safe Diet Advancement Good Barriers to Reach Goals -- Barriers/Prognosis Comment -- CHL IP DIET RECOMMENDATION 01/09/2020 SLP Diet Recommendations Dysphagia 1 (Puree) solids;Thin liquid Liquid Administration via Cup;Straw Medication Administration Whole meds with puree Compensations Minimize environmental distractions;Slow rate;Small sips/bites;Multiple dry swallows after each bite/sip Postural Changes Seated upright at 90 degrees   CHL IP OTHER RECOMMENDATIONS 01/09/2020 Recommended Consults -- Oral Care Recommendations Oral care BID Other Recommendations --   CHL IP FOLLOW UP RECOMMENDATIONS 01/09/2020 Follow up  Recommendations Other (comment)   CHL IP FREQUENCY AND DURATION 01/09/2020 Speech Therapy Frequency (ACUTE ONLY) min 2x/week Treatment Duration 2 weeks      CHL IP ORAL PHASE 01/09/2020 Oral Phase Impaired Oral - Pudding Teaspoon -- Oral - Pudding Cup -- Oral - Honey Teaspoon -- Oral - Honey Cup -- Oral - Nectar Teaspoon -- Oral - Nectar Cup WFL Oral - Nectar Straw -- Oral - Thin Teaspoon -- Oral - Thin Cup Right anterior bolus loss Oral - Thin Straw WFL Oral - Puree WFL Oral - Mech Soft -- Oral - Regular Lingual/palatal residue;Delayed oral transit Oral - Multi-Consistency -- Oral - Pill -- Oral Phase - Comment --  CHL IP PHARYNGEAL PHASE 01/09/2020 Pharyngeal Phase Impaired Pharyngeal- Pudding Teaspoon -- Pharyngeal -- Pharyngeal- Pudding Cup -- Pharyngeal -- Pharyngeal- Honey Teaspoon -- Pharyngeal -- Pharyngeal- Honey Cup -- Pharyngeal -- Pharyngeal- Nectar Teaspoon -- Pharyngeal -- Pharyngeal- Nectar Cup Pharyngeal residue - valleculae Pharyngeal -- Pharyngeal- Nectar Straw -- Pharyngeal -- Pharyngeal- Thin Teaspoon -- Pharyngeal -- Pharyngeal- Thin Cup Pharyngeal residue - valleculae Pharyngeal -- Pharyngeal- Thin Straw Pharyngeal residue - valleculae Pharyngeal -- Pharyngeal- Puree WFL Pharyngeal -- Pharyngeal- Mechanical Soft -- Pharyngeal -- Pharyngeal- Regular Delayed swallow initiation-pyriform sinuses;Penetration/Aspiration during swallow Pharyngeal -- Pharyngeal- Multi-consistency -- Pharyngeal -- Pharyngeal- Pill -- Pharyngeal -- Pharyngeal Comment --  CHL IP CERVICAL ESOPHAGEAL PHASE 01/09/2020 Cervical Esophageal Phase WFL Pudding Teaspoon -- Pudding Cup -- Honey Teaspoon -- Honey Cup -- Nectar Teaspoon -- Nectar Cup -- Nectar Straw -- Thin Teaspoon -- Thin Cup -- Thin Straw -- Puree -- Mechanical Soft -- Regular -- Multi-consistency -- Pill -- Cervical Esophageal Comment -- [place] Siren 01/09/2020, 4:32 PM  Orbie Pyo Litaker M.Ed Actor Pager [phone] Office  [phone]                Scheduled Meds: . FLUoxetine  10 mg Oral Daily  . mouth rinse  15 mL Mouth Rinse BID  . pravastatin  40 mg Oral Daily  . rivaroxaban  20 mg Oral Q supper   Continuous Infusions: . ampicillin-sulbactam (UNASYN) IV 3 g (01/10/20 0505)     LOS: 1 day      Time spent: 25 minutes   Dessa Phi, DO [facility] 01/10/2020, 10:32 AM   Available via Epic secure chat 7am-7pm After these hours, please refer to coverage provider listed on [email or web address]

## 2020-01-10 NOTE — NC FL2 (Signed)
[place] LEVEL OF CARE SCREENING TOOL     IDENTIFICATION  Patient Name: Michael Ware Birthdate: [birth year] Sex: male Admission Date (Current Location): 01/07/2020  [facility] and [place] Number:  Herbalist and Address:  [facility] [place], [place] [address], [place], [place] [address]      Provider Number: [id number]  Attending Physician Name and Address:  Dessa Phi, DO  Relative Name and Phone Number:  [name]    Current Level of Care: Hospital Recommended Level of Care: [place] Prior Approval Number:    Date Approved/Denied:   PASRR Number: [id number]  Discharge Plan: SNF    Current Diagnoses: Patient Active Problem List   Diagnosis Date Noted  . Acute metabolic encephalopathy 70/02/7492  . Lower extremity weakness 01/08/2020  . TIA (transient ischemic attack) 01/08/2020  . Acute deep vein thrombosis (DVT) of left peroneal vein (HCC)   . COPD with acute exacerbation ([place])   . Pulmonary emboli ([place]) 11/10/2019  . Hypoxia   . Acute pain of left lower extremity 11/08/2019  . Rib pain on right side 11/08/2019  . Dehydration   . Bradycardia   . Near syncope   . Depression   . Lactic acidosis   . AKI (acute kidney injury) ([place])   . Hyperlipidemia   . Hypotension   . Humerus fracture 11/04/2019  . Internal hemorrhoids with bleeding and fecal soiling 11/19/2014  . Internal hemorrhoids 10/22/2014  . Medication management 12/30/2013  . Sinus bradycardia 02/16/2013  . Rectal bleeding 01/19/2013  . Routine general medical examination at a health care facility 12/24/2012  . Essential hypertension 12/24/2012  . Mixed hyperlipidemia 12/24/2012  . Abnormal glucose 12/24/2012  . ADD (attention deficit disorder)   . Vitamin D deficiency   . Elevated hemoglobin A1c   . BPH (benign prostatic hypertrophy) with urinary obstruction   . Diverticulosis of colon 01/23/2011    Orientation RESPIRATION BLADDER  Height & Weight     Self, Time, Situation, Place  O2 (NC2L) External catheter Weight:   Height:     BEHAVIORAL SYMPTOMS/MOOD NEUROLOGICAL BOWEL NUTRITION STATUS      Incontinent Diet (See DC summary)  AMBULATORY STATUS COMMUNICATION OF NEEDS Skin   Extensive Assist Verbally PU Stage and Appropriate Care (Medial Sacrum Pressure Injury)                       Personal Care Assistance Level of Assistance  Bathing, Feeding, Dressing Bathing Assistance: Maximum assistance Feeding assistance: Limited assistance Dressing Assistance: Maximum assistance     Functional Limitations Info  Sight, Hearing, Speech Sight Info: Impaired Hearing Info: Adequate Speech Info: Adequate    SPECIAL CARE FACTORS FREQUENCY  PT (By licensed PT), OT (By licensed OT)     PT Frequency: 5x week OT Frequency: 5x week            Contractures Contractures Info: Not present    Additional Factors Info  Code Status, Allergies, Psychotropic Code Status Info: DNR Allergies Info: Atenol, Morphine, Trazamine Psychotropic Info: Zoloft         Current Medications (01/10/2020):  This is the current hospital active medication list Current Facility-Administered Medications  Medication Dose Route Frequency Provider Last Rate Last Admin  . acetaminophen (TYLENOL) tablet 650 mg  650 mg Oral Q4H PRN Marylyn Ishihara, Tyrone A, DO       Or  . acetaminophen (TYLENOL) 160 MG/5ML solution 650 mg  650 mg Per Tube  Q4H PRN Cherylann Ratel A, DO       Or  . acetaminophen (TYLENOL) suppository 650 mg  650 mg Rectal Q4H PRN Marylyn Ishihara, Tyrone A, DO      . Ampicillin-Sulbactam (UNASYN) 3 g in sodium chloride 0.9 % 100 mL IVPB  3 g Intravenous Q8H Dessa Phi, DO 200 mL/hr at 01/10/20 1407 3 g at 01/10/20 1407  . diazepam (VALIUM) tablet 10 mg  10 mg Oral Daily PRN Mesner, Corene Cornea, MD      . FLUoxetine (PROZAC) capsule 10 mg  10 mg Oral Daily Kyle, Tyrone A, DO   10 mg at 01/10/20 0944  . MEDLINE mouth rinse  15 mL Mouth Rinse BID Dessa Phi, DO   15 mL at 01/10/20 0945  . melatonin tablet 3 mg  3 mg Oral QHS PRN Marylyn Ishihara, Tyrone A, DO      . pravastatin (PRAVACHOL) tablet 40 mg  40 mg Oral Daily Kyle, Tyrone A, DO   40 mg at 01/10/20 0943  . rivaroxaban (XARELTO) tablet 20 mg  20 mg Oral Q supper Marylyn Ishihara, Tyrone A, DO   20 mg at 01/09/20 1704  . senna-docusate (Senokot-S) tablet 1 tablet  1 tablet Oral QHS PRN Marylyn Ishihara, Tyrone A, DO      . traMADol (ULTRAM) tablet 50 mg  50 mg Oral Q6H PRN Marylyn Ishihara, Tyrone A, DO   50 mg at 01/10/20 1749     Discharge Medications: Please see discharge summary for a list of discharge medications.  Relevant Imaging Results:  Relevant Lab Results:   Additional Information SS# [place], [place]

## 2020-01-10 NOTE — Evaluation (Signed)
Speech Language Pathology Evaluation Patient Details Name: KALEEL SCHMIEDER MRN: [id number] DOB: [birth year] Today's Date: 01/10/2020 Time:  -     Problem List:  Patient Active Problem List   Diagnosis Date Noted  . Acute metabolic encephalopathy 58/52/7782  . Lower extremity weakness 01/08/2020  . TIA (transient ischemic attack) 01/08/2020  . Acute deep vein thrombosis (DVT) of left peroneal vein (HCC)   . COPD with acute exacerbation ([place])   . Pulmonary emboli ([place]) 11/10/2019  . Hypoxia   . Acute pain of left lower extremity 11/08/2019  . Rib pain on right side 11/08/2019  . Dehydration   . Bradycardia   . Near syncope   . Depression   . Lactic acidosis   . AKI (acute kidney injury) ([place])   . Hyperlipidemia   . Hypotension   . Humerus fracture 11/04/2019  . Internal hemorrhoids with bleeding and fecal soiling 11/19/2014  . Internal hemorrhoids 10/22/2014  . Medication management 12/30/2013  . Sinus bradycardia 02/16/2013  . Rectal bleeding 01/19/2013  . Routine general medical examination at a health care facility 12/24/2012  . Essential hypertension 12/24/2012  . Mixed hyperlipidemia 12/24/2012  . Abnormal glucose 12/24/2012  . ADD (attention deficit disorder)   . Vitamin D deficiency   . Elevated hemoglobin A1c   . BPH (benign prostatic hypertrophy) with urinary obstruction   . Diverticulosis of colon 01/23/2011   Past Medical History:  Past Medical History:  Diagnosis Date  . Acute GI bleeding 01/19/13   most likely due to diverticulosis  . BPH (benign prostatic hypertrophy) with urinary obstruction   . Bradycardia   . Diverticulosis   . Elevated hemoglobin A1c   . Hyperglycemia   . Hyperlipidemia   . Hypertension   . Internal hemorrhoids with bleeding and fecal soiling 11/19/2014  . Rectal bleed 01/19/13  . Vitamin D deficiency    Past Surgical History:  Past Surgical History:  Procedure Laterality Date  . ANAL FISSURE REPAIR  1980  . HEMORRHOID  BANDING    . ORIF HUMERUS FRACTURE Right 11/07/2019   Procedure: OPEN REDUCTION INTERNAL FIXATION (ORIF) DISTAL HUMERUS FRACTURE;  Surgeon: Justice Britain, MD;  Location: [facility];  Service: Orthopedics;  Laterality: Right;  . rectal fissure procedure  1970's  . ROTATOR CUFF REPAIR     bilateral  . TONSILLECTOMY     HPI:  86 y.o. male with medical history significant of bradycardia, HTN, HLD. Presenting with BLE weakness, confusion, and right facial droop.  History per daughter. Has been working with PT at home for several weeks and was doing well. In normal state of health until Monday morning. On Monday morning he seemed to have a harder time with his exercises but generally ok. Tuesday morning, he called for help to get out of bed and his legs "were like noodles";    Assessment / Plan / Recommendation Clinical Impression  Pt seen for speech, language, cognitive assessment with administration of SLUMS ([place] Mental Status Examination) yielding a score of 20/26 achieved (due to elimination of writing portion with limited use of right hand, refused task d/t pain) with 27/30 being a typical score.  Deficits noted in the areas of attention, memory, and awareness.  Patient with verbal agitation, perseveration and decreased overall processing of information during examination with slow response to questions throughout portions of SLE.  OME revealed decreased sensory awareness within left labial area as liquids fell with pt recognizing loss intermittently, but not consistently during intake.  Pt  found with applesauce cup to mouth with redirection needed to use spoon for consumption when SLP arrived.  Oriented x4, but with slower response time with questions.  Pt demonstrated decreased retrieval of new information, decreased sustained attention tasks such as repeating digits backwards (50%) and auditory comprehension for paragraph retention being 75% accurate.  Pt did request repetition and denied  any hearing loss during assessment.  Pt indicated he lost his wife over a year ago and was teary when recalling this event. ST will f/u while in acute setting for dysphagia/cognitive impairment.  Thank you for this consult.        SLP Assessment  SLP Recommendation/Assessment: Patient needs continued Speech Language Pathology Services SLP Visit Diagnosis: Cognitive communication deficit (R41.841)    Follow Up Recommendations  Other (comment) (TBD)    Frequency and Duration min 2x/week  1 week      SLP Evaluation Cognition  Overall Cognitive Status: Impaired/Different from baseline Arousal/Alertness: Awake/alert Attention: Sustained Sustained Attention: Impaired Sustained Attention Impairment: Verbal basic;Functional basic Memory: Impaired Memory Impairment: Retrieval deficit;Decreased recall of new information Immediate Memory Recall: Sock;Blue;Bed Memory Recall Sock: Without Cue Memory Recall Blue: Without Cue Memory Recall Bed: With Cue Awareness: Impaired Awareness Impairment: Intellectual impairment Problem Solving: Impaired Problem Solving Impairment: Verbal basic;Functional basic Executive Function: Organizing;Self Monitoring Organizing: Impaired Organizing Impairment: Verbal basic;Functional basic Self Monitoring: Impaired Self Monitoring Impairment: Verbal basic;Functional basic Behaviors: Verbal agitation;Perseveration;Poor frustration tolerance Safety/Judgment: Other (comment) Comments: Pt with limited awareness during eating with cup of applesauce brought to mouth vs using spoon for consumption       Comprehension  Auditory Comprehension Overall Auditory Comprehension: Impaired Yes/No Questions: Within Functional Limits Commands: Impaired Multistep Basic Commands: 25-49% accurate Conversation: Simple Interfering Components: Attention;Pain;Processing speed EffectiveTechniques: Extra processing time;Pausing;Repetition;Stressing words;Visual/Gestural  cues Visual Recognition/Discrimination Discrimination: Not tested Reading Comprehension Reading Status: Not tested    Expression Expression Primary Mode of Expression: Verbal Verbal Expression Overall Verbal Expression: Appears within functional limits for tasks assessed Level of Generative/Spontaneous Verbalization: Conversation Repetition: No impairment Naming: No impairment Non-Verbal Means of Communication: Not applicable Written Expression Dominant Hand: Right Written Expression: Not tested   Oral / Motor  Oral Motor/Sensory Function Overall Oral Motor/Sensory Function: Mild impairment Facial Sensation: Reduced left Motor Speech Overall Motor Speech: Appears within functional limits for tasks assessed Respiration: Within functional limits Phonation: Low vocal intensity Resonance: Within functional limits Articulation: Within functional limitis Intelligibility: Intelligible Motor Planning: Witnin functional limits Motor Speech Errors: Not applicable                       Elvina Sidle, M.S., CCC-SLP 01/10/2020, 1:21 PM

## 2020-01-10 NOTE — Progress Notes (Signed)
STROKE TEAM PROGRESS NOTE   HISTORY OF PRESENT ILLNESS (per record) Michael Ware is an 86 y.o. Caucasian male with recent humeral fx s/p repair who recently finished rehab and went home. At home, pt two daughters take turns to take care of him. Per daughter, he was able to work slowly with belt hold by daughter, he did not require walker or cane with daughter assist. He seems getting slow improvement day by day for the last one month.   Last Monday night, he went to bed at his new baseline. He was able to get out to go to bathroom at middle of night by himself. However, later, pt called daughter to help him go to bathroom and daughter found pt "leg likes noodles", can not hold himself, but daughter managed him to the toilet. On the way back, pt again not able to walk and daughter had to sit him down on the floor and called 911. Paramedics helped pt back to bed that night. Tuesday all day, he was in bed, lethargic. Tuesday night again, he tried to go to bathroom but too weak to hold and daughter again put him on the floor called 911. Wednesday, he was in the dinning table, lethargic but leaning toward left side, not able sit straight. He was Estate manager/land agent and sharp minded, but Wednesday he could not even write down his name. Daughter also found right facial droop. EMS called. Pt sent to [facility] ED where teleneurology called and recommend [facility] for stroke work up and MRI spinal cord. Not tPA candidate given outside window. MRI brain no infarct and MRI T/L spine unremarkable. Pt did have chronic LBP but this time no back pain or neck pain. During admission, he had oral secretion and cough, CXR can not rule out left basilar infection. Put on empiric unasyn IV.  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 However, his right arm constant swollen which has not changed much since the surgery. Still has pain with movement and not weight bearing  now.  Time last known well: last Monday night tPA Given: No: outside window and on xarelto MRS:  3-4 NIHSS:  11 on intial eval in ED   INTERVAL HISTORY His  Daughter is at the bedside.  He is doing better today and alert interactive and answering questions.  Vital signs are stable.  MRI scan of the brain done twice is negative for acute stroke.  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  60% right ICA and 50% left ICA stenosis.  LDL cholesterol 70 mg percent.  Hemoglobin A1c 4.9.  Echocardiogram done on 10/27/2019 showed hyperdynamic LV function with EF of 70 to 75%.    OBJECTIVE Vitals:   01/09/20 2103 01/10/20 0334 01/10/20 0546 01/10/20 0800  BP: (!) 120/58 (!) 131/59 (!) 116/54 (!) 142/55  Pulse: 79 (!) 58 (!) 58 (!) 59  Resp: 18 18 18 18   Temp: 98.9 F (37.2 C) (!) 97.5 F (36.4 C) 98.2 F (36.8 C) 99 F (37.2 C)  TempSrc: Oral Oral Oral   SpO2: 94% 96% 96% 94%    CBC:  Recent Labs  Lab 01/07/20 2153 01/10/20 0113  WBC 6.8 7.0  NEUTROABS 4.1  --   HGB 14.4 13.9  HCT 42.8 41.3  MCV 105.2* 101.5*  PLT 245 417    Basic Metabolic Panel:  Recent Labs  Lab 01/07/20 2153 01/10/20  0113  NA 139 136  K 3.8 3.8  CL 101 99  CO2 28 29  GLUCOSE 100* 109*  BUN 12 10  CREATININE 0.97 1.05  CALCIUM 9.0 8.8*    Lipid Panel:     Component Value Date/Time   CHOL 120 01/10/2020 0113   TRIG 120 01/10/2020 0113   HDL 26 (L) 01/10/2020 0113   CHOLHDL 4.6 01/10/2020 0113   VLDL 24 01/10/2020 0113   LDLCALC 70 01/10/2020 0113   HgbA1c:  Lab Results  Component Value Date   HGBA1C 4.9 01/09/2020   Urine Drug Screen: No results found for: LABOPIA, COCAINSCRNUR, LABBENZ, AMPHETMU, THCU, LABBARB  Alcohol Level No results found for: ETH  IMAGING  CT ANGIO HEAD W OR WO CONTRAST CT ANGIO NECK W OR WO CONTRAST 01/08/2020 IMPRESSION:  No acute intracranial abnormality. Plaque along the proximal right internal carotid causes up to  60% stenosis. Plaque along the proximal left internal carotid causes less than 50% stenosis. Multifocal left vertebral artery stenosis including severe stenosis at the V1-V2 junction. Up to moderate stenosis of the intracranial intra carotid arteries. High-grade stenosis or occlusion of the mid left intracranial vertebral artery just beyond the PICA origin with distal reconstitution.  MR BRAIN WO CONTRAST 01/09/2020 IMPRESSION:  No acute infarct.   MR CERVICAL SPINE WO CONTRAST 01/09/2020 IMPRESSION:  1. Motion degraded examination.  2. Severe right C3-4 neural foraminal stenosis due to large right subarticular osteophyte.  3. Moderate right C4-5 and C5-6 neural foraminal stenosis due to combination of uncovertebral hypertrophy and disc disease.  4. Abnormal left vertebral artery flow void may indicate slow flow or occlusion.   DG CHEST PORT 1 VIEW 01/10/2020 IMPRESSION: LEFT basilar airspace disease versus accentuation of cardiac fat pad. Could represent developing infection. Continued follow-up radiographs without rotation may be helpful to exclude the possibility of developing infection in this location.  DG CHEST PORT 1 VIEW 01/10/2020 IMPRESSION:  LEFT basilar opacity, stable, most likely small pleural effusion and/or atelectasis. Recommend follow-up chest x-ray to ensure resolution.   DG Swallowing Func-Speech Pathology Result Date: 01/09/2020 Objective Swallowing Evaluation: Type of Study: MBS-Modified Barium Swallow Study  Patient Details Name: Michael Ware MRN: [id number] Date of Birth: 08/14/1931 Today's Date: 01/09/2020 Time: SLP Start Time (ACUTE ONLY): 1400 -SLP Stop Time (ACUTE ONLY): 1417 SLP Time Calculation (min) (ACUTE ONLY): 17 min Past Medical History: Past Medical History: Diagnosis Date . Acute GI bleeding 01/19/13  most likely due to diverticulosis . BPH (benign prostatic hypertrophy) with urinary obstruction  . Bradycardia  . Diverticulosis  . Elevated hemoglobin A1c   . Hyperglycemia  . Hyperlipidemia  . Hypertension  . Internal hemorrhoids with bleeding and fecal soiling 11/19/2014 . Rectal bleed 01/19/13 . Vitamin D deficiency  Past Surgical History: Past Surgical History: Procedure Laterality Date . ANAL FISSURE REPAIR  1980 . HEMORRHOID BANDING   . ORIF HUMERUS FRACTURE Right 11/07/2019  Procedure: OPEN REDUCTION INTERNAL FIXATION (ORIF) DISTAL HUMERUS FRACTURE;  Surgeon: Justice Britain, MD;  Location: [facility];  Service: Orthopedics;  Laterality: Right; . rectal fissure procedure  1970's . ROTATOR CUFF REPAIR    bilateral . TONSILLECTOMY   HPI: Michael Ware is a 86 y.o. male with medical history significant of bradycardia, HTN, HLD. Presenting with BLE weakness, confusion, and right facial droop. MRI Senescent changes without acute or reversible finding.  No data recorded Assessment / Plan / Recommendation CHL IP CLINICAL IMPRESSIONS 01/09/2020 Clinical Impression Pt exhibited mild oropharyngeal dysphagia with trace penetration.  Decreased oral containment with right labial leakage and reduced labial seal on cup. Pt's hyolaryngeal elevation, ROM and timing of protective mechanisms was adequate. While pt is masticating the cracker moves over tongue filling vallecuale and pyriform sinuses and pt distracted by dental/oral residue attempting to remove and not initiating swallow. When requested to initiate swallow sooner he was unable but when reflexive swallow was triggered, pharyngeal residue from regular texture was penetrated into vestibule above vocal cords. He produced throat clear to expel majority. Several instances of throat clear/coughs without barium observed in vestibule or below cords. Puree textures allowed for easier transit without oral residue and more timelier swallow. Recommend Dys 1 (puree), thin liquids, pill whole in pudding, straws allowed, ensure swallow prior to next bite. ST will follow.  SLP Visit Diagnosis Dysphagia, oropharyngeal phase (R13.12) Attention  and concentration deficit following -- Frontal lobe and executive function deficit following -- Impact on safety and function Mild aspiration risk;Moderate aspiration risk   CHL IP TREATMENT RECOMMENDATION 01/09/2020 Treatment Recommendations Therapy as outlined in treatment plan below   Prognosis 01/09/2020 Prognosis for Safe Diet Advancement Good Barriers to Reach Goals -- Barriers/Prognosis Comment -- CHL IP DIET RECOMMENDATION 01/09/2020 SLP Diet Recommendations Dysphagia 1 (Puree) solids;Thin liquid Liquid Administration via Cup;Straw Medication Administration Whole meds with puree Compensations Minimize environmental distractions;Slow rate;Small sips/bites;Multiple dry swallows after each bite/sip Postural Changes Seated upright at 90 degrees   CHL IP OTHER RECOMMENDATIONS 01/09/2020 Recommended Consults -- Oral Care Recommendations Oral care BID Other Recommendations --   CHL IP FOLLOW UP RECOMMENDATIONS 01/09/2020 Follow up Recommendations Other (comment)   CHL IP FREQUENCY AND DURATION 01/09/2020 Speech Therapy Frequency (ACUTE ONLY) min 2x/week Treatment Duration 2 weeks      CHL IP ORAL PHASE 01/09/2020 Oral Phase Impaired Oral - Pudding Teaspoon -- Oral - Pudding Cup -- Oral - Honey Teaspoon -- Oral - Honey Cup -- Oral - Nectar Teaspoon -- Oral - Nectar Cup WFL Oral - Nectar Straw -- Oral - Thin Teaspoon -- Oral - Thin Cup Right anterior bolus loss Oral - Thin Straw WFL Oral - Puree WFL Oral - Mech Soft -- Oral - Regular Lingual/palatal residue;Delayed oral transit Oral - Multi-Consistency -- Oral - Pill -- Oral Phase - Comment --  CHL IP PHARYNGEAL PHASE 01/09/2020 Pharyngeal Phase Impaired Pharyngeal- Pudding Teaspoon -- Pharyngeal -- Pharyngeal- Pudding Cup -- Pharyngeal -- Pharyngeal- Honey Teaspoon -- Pharyngeal -- Pharyngeal- Honey Cup -- Pharyngeal -- Pharyngeal- Nectar Teaspoon -- Pharyngeal -- Pharyngeal- Nectar Cup Pharyngeal residue - valleculae Pharyngeal -- Pharyngeal- Nectar Straw -- Pharyngeal --  Pharyngeal- Thin Teaspoon -- Pharyngeal -- Pharyngeal- Thin Cup Pharyngeal residue - valleculae Pharyngeal -- Pharyngeal- Thin Straw Pharyngeal residue - valleculae Pharyngeal -- Pharyngeal- Puree WFL Pharyngeal -- Pharyngeal- Mechanical Soft -- Pharyngeal -- Pharyngeal- Regular Delayed swallow initiation-pyriform sinuses;Penetration/Aspiration during swallow Pharyngeal -- Pharyngeal- Multi-consistency -- Pharyngeal -- Pharyngeal- Pill -- Pharyngeal -- Pharyngeal Comment --  CHL IP CERVICAL ESOPHAGEAL PHASE 01/09/2020 Cervical Esophageal Phase WFL Pudding Teaspoon -- Pudding Cup -- Honey Teaspoon -- Honey Cup -- Nectar Teaspoon -- Nectar Cup -- Nectar Straw -- Thin Teaspoon -- Thin Cup -- Thin Straw -- Puree -- Mechanical Soft -- Regular -- Multi-consistency -- Pill -- Cervical Esophageal Comment -- Houston Siren 01/09/2020, 4:32 PM  Orbie Pyo Litaker M.Ed Actor Pager [phone] Office [phone]             ECG - SR rate 65 BPM. (See cardiology reading for complete details)  PHYSICAL EXAM  Blood pressure (!) 142/55, pulse (!) 59, temperature 99 F (37.2 C), resp. rate 18, SpO2 94 %. Pleasant elderly Caucasian male not in distress. . Afebrile. Head is nontraumatic. Neck is supple without bruit.    Cardiac exam no murmur or gallop. Lungs are clear to auscultation. Distal pulses are well felt.  Neurological Exam :    He is awake alert and interactive.  He has diminished attention, registration and recall but follows commands well.  No dysarthria or aphasia.  Extraocular movements are full range without nystagmus.  Blinks to threat bilaterally.  Follows simple commands.  Slight right nasolabial fold asymmetry when he smiles.  Tongue midline.  Motor system exam shows limited right upper extremity movement secondary to recent surgery but able to grip well.  Normal strength in left upper extremity.  Moves left upper extremity briskly off the bed.  Able to lift right lower  extremity off the bed but not to the same degree as the left.  Deep tendon reflexes symmetric.  Plantars downgoing.     ASSESSMENT/PLAN Michael Ware is a 86 y.o. male with history of recent right humeral fx, he had DVT and PE, now on Xarelto and he is compliant with Xarelto, hx of Gi bleed, bradycardia, Htn, HLD,  presenting with weakness, lethargy, confusion, facial droop and gait difficulties. He did not receive IV t-PA due to Xarelto and late presentation (>4.5 hours from time of onset).  Bilateral lower extremity weakness and right facial droop in the setting of pneumonia and deconditioning.  Doubt TIA  Resultant transient bilateral leg and right facial weakness which is improving.  Unclear if this was a TIA or deconditioning in the setting of pneumonia  CT head - No acute abnormality. Small vessel disease. Atrophy.   MRI - No acute abnormality.   CTA head & neck - No acute abnormality.R ICA 60%. Proximal L ICA <50%.  Multifocal L VA stenosis w/ severe stenosis V1-2 jxn. Moderate intracranial stenosis w/ high-grade stenosis/ occlusion mid L intracranial VA beyond PICA w/ distal reconstitution. Per Dr Erlinda Hong - "On my review, no significant ICA stenosis bilaterally"  MRI T/L spine - unremarkable  CT Perfusion - not ordered  Carotid Doppler - not ordered  2D Echo - 2/[id number]%. No source of embolus  [employer] Virus 2 - negative  LDL - 70  HgbA1c - 4.9  UDS - not ordered  VTE prophylaxis - Xarelto Diet  Diet Order            DIET - DYS 1 Room service appropriate? No; Fluid consistency: Thin  Diet effective now                 Xarelto (rivaroxaban) daily prior to admission, now on Xarelto (rivaroxaban) daily  Patient counseled to be compliant with his antithrombotic medications  Ongoing aggressive stroke risk factor management  Therapy recommendations:  [place] placement recommended   Disposition:  Pending  Acute hypoxemic respiratory  failure Encephalopathy due to ? Aspiration penumonia  desat on arrival   CXR ? Left basilar infection  Repeat CXR - 12/4 - LEFT basilar opacity, stable, most likely small pleural effusion and/or atelectasis. Recommend follow-up chest x-ray to ensure resolution.   On unasyn  No leukocytosis  UA pending  Ammonia pending  Hx of DVT/PE Recent left humeral fx   On Xarelto  Mild left arm pain  Continue Xarelto  Hypertension  BP low at times yesterday, now Stable  Avoid low BP  BP goal normotensive  Encourage po intake  Hyperlipidemia  Home meds:pravachol 40,resumed in hospital  LDLpending,goal < 70  Continue statin at discharge  Other Stroke Risk Factors  Advanced age  Former Cigarette smoker, quit 21 yrs ago  Occasional ETOH use  Other Active Problems  Hx delirium d/t polypharmacy - last admission with morphine  Anxiety on prozac  Chronic DDD  Macrocytosis   Code status - DNR  Hospital day # 1 Patient had transient weakness of bilateral lower extremities and face possibly in the setting of deconditioning from pneumonia.  MRI scan of the brain 10 done twice is negative for acute stroke and CT angiogram shows terminal left vertebral artery occlusion which is likely chronic and unrelated to his presentation.  Recommend continue Xarelto for history of pulmonary embolism and aggressive risk factor modification.  Long discussion with patient and daughter and answered questions.  Daughter prefers patient go to inpatient rehab rather than skilled nursing facility.  Stroke team will sign off.  Kindly call for questions.  Discussed with Dr. Maylene Roes.  Greater than 50% time during this 25-minute visit was spent on counseling and coordination of care about his transient neurological symptoms and discussion about his results of evaluation and answering questions. Antony Contras, MD To contact Stroke Continuity provider, please refer to [email or web address] After hours,  contact General Neurology

## 2020-01-11 NOTE — Plan of Care (Signed)

## 2020-01-11 NOTE — Progress Notes (Signed)
PROGRESS NOTE    Michael Ware  CHE:[id number] DOB: Mar 18, 1931 DOA: 01/07/2020 PCP: Alroy Dust, L.Marlou Sa, MD     Brief Narrative:  Michael Ware is a 86 y.o. male with medical history significant of bradycardia, HTN, HLD, PE, DVT, history of hospital delirium due to polypharmacy. He presented with BLE weakness, confusion, and right facial droop.  History per daughter. Has been working with PT at home for several weeks and was doing well. In normal state of health until Monday morning. On Monday morning he seemed to have a harder time with his exercises but generally ok. Tuesday morning, he called for help to get out of bed and his legs "were like noodles". Daughter helped him to the bathroom and back. When she got him to the bed, she noticed that he didn't have the strength to get back into the bed. PT noticed that he was weaker that day. The weakness progressed over the next day. They spoke with his PCP who recommended that he go to walk-in clinic or ED yesterday. The daughter noted that he was having a til to the left. They went to walk-in clinic and they recommended an MRI lumbar spine. They went home and the daughter noticed that he was having difficulty writing and he was more confused. She did what she knew for a stroke test: asked him to speak and smile. She said his speech seemed slurred but his smile was symmetrical. She became concerned and had him transported to the ED.  Work-up was negative for neurologic etiology of his encephalopathy and weakness.  Speech therapy evaluated patient, at risk for dysphagia and aspiration.  Chest x-ray revealed left-sided pneumonia and patient was started on Unasyn.  New events last 24 hours / Subjective: Encephalopathy has resolved.  Upset about having to go to rehab, states that he was at [facility] for 20 days and does not want to go back.  Assessment & Plan:   Active Problems:   Lower extremity weakness   TIA (transient ischemic attack)   Acute metabolic  encephalopathy   Acute metabolic encephalopathy and LE weakness likely secondary to pneumonia -Presented with lower extremity weakness, leaning to the left, slurred speech and right facial droop -CT head without acute intracranial abnormality -MRI brain negative -CTA head and neck without acute intracranial abnormality -MRI thoracic lumbar spine negative for acute finding -Repeat MRI brain negative -MRI cervical spine with findings of moderate to severe neural foraminal stenosis -Neurology work-up has largely been unremarkable.  His symptoms are likely secondary to infectious process -Appreciate neurology -PT OT recommending SNF placement  Acute hypoxemic respiratory failure secondary to pneumonia -SPO2 86% on room air, currently requiring 2 L nasal cannula O2 -Chest x-ray revealed left basilar airspace disease versus accentuation of cardiac fat pad, could represent developing infection -Repeat chest x-ray showed left basilar opacity -Unasyn for possible aspiration pneumonia  History of hospital delirium and confusion due to polypharmacy -Limit narcotic use. Has been doing well on tramadol as needed. Has been taking Valium as needed nightly for years.  History of PE, DVT -Continue Xarelto  Hyperlipidemia -Continue pravachol  Anxiety -Continue Prozac  In agreement with assessment of the pressure ulcer as below:  Pressure Injury 01/08/20 Sacrum Medial (Active)  01/08/20 2300  Location: Sacrum  Location Orientation: Medial  Staging:   Wound Description (Comments):   Present on Admission: Yes         DVT prophylaxis:   rivaroxaban (XARELTO) tablet 20 mg  Code Status: DNR Family Communication:  No family at bedside Disposition Plan:  Status is: Inpatient  Remains inpatient appropriate because:Unsafe d/c plan   Dispo: The patient is from: Home              Anticipated d/c is to: SNF              Anticipated d/c date is: 1 day              Patient currently is  medically stable to d/c.  SNF placement pending   Consultants:   Neurology  Procedures:   None  Antimicrobials:  Anti-infectives (From admission, onward)   Start     Dose/Rate Route Frequency Ordered Stop   01/09/20 1400  Ampicillin-Sulbactam (UNASYN) 3 g in sodium chloride 0.9 % 100 mL IVPB        3 g 200 mL/hr over 30 Minutes Intravenous Every 8 hours 01/09/20 1253         Objective: Vitals:   01/10/20 1941 01/10/20 2337 01/11/20 0325 01/11/20 0829  BP: (!) 120/55 (!) 133/59 (!) 124/47 (!) 128/51  Pulse: (!) 55 (!) 56 60 64  Resp: 16 16 18 18   Temp: 97.8 F (36.6 C) 97.6 F (36.4 C) (!) 97.2 F (36.2 C) 98.3 F (36.8 C)  TempSrc: Oral Oral Oral   SpO2: 96% 94% 97% 93%  Weight:      Height:       No intake or output data in the 24 hours ending 01/11/20 1013 Filed Weights   01/10/20 1700  Weight: 88.1 kg     Examination: General exam: Appears calm and comfortable  Respiratory system: Clear to auscultation anteriorly. Respiratory effort normal.  On nasal cannula O2 Cardiovascular system: S1 & S2 heard, RRR. No pedal edema. Gastrointestinal system: Abdomen is nondistended, soft and nontender. Normal bowel sounds heard. Central nervous system: Alert and oriented. Speech clear  Extremities: Symmetric in appearance bilaterally  Skin: No rashes, lesions or ulcers on exposed skin  Psychiatry: Judgement and insight appear stable. Mood & affect appropriate.    Data Reviewed: I have personally reviewed following labs and imaging studies  CBC: Recent Labs  Lab 01/07/20 2153 01/10/20 0113  WBC 6.8 7.0  NEUTROABS 4.1  --   HGB 14.4 13.9  HCT 42.8 41.3  MCV 105.2* 101.5*  PLT 245 643   Basic Metabolic Panel: Recent Labs  Lab 01/07/20 2153 01/10/20 0113  NA 139 136  K 3.8 3.8  CL 101 99  CO2 28 29  GLUCOSE 100* 109*  BUN 12 10  CREATININE 0.97 1.05  CALCIUM 9.0 8.8*   GFR: Estimated Creatinine Clearance: 54.5 mL/min (by C-G formula based on SCr of  1.05 mg/dL). Liver Function Tests: Recent Labs  Lab 01/07/20 2153 01/10/20 0113  AST 34 29  ALT 27 23  ALKPHOS 94 89  BILITOT 0.7 0.5  PROT 6.5 5.5*  ALBUMIN 3.6 2.9*   No results for input(s): LIPASE, AMYLASE in the last 168 hours. Recent Labs  Lab 01/10/20 0113  AMMONIA 34   Coagulation Profile: No results for input(s): INR, PROTIME in the last 168 hours. Cardiac Enzymes: No results for input(s): CKTOTAL, CKMB, CKMBINDEX, TROPONINI in the last 168 hours. BNP (last 3 results) No results for input(s): PROBNP in the last 8760 hours. HbA1C: Recent Labs    01/09/20 1222  HGBA1C 4.9   CBG: Recent Labs  Lab 01/09/20 2111  GLUCAP 133*   Lipid Profile: Recent Labs    01/10/20 0113  CHOL 120  HDL 26*  LDLCALC 70  TRIG 120  CHOLHDL 4.6   Thyroid Function Tests: No results for input(s): TSH, T4TOTAL, FREET4, T3FREE, THYROIDAB in the last 72 hours. Anemia Panel: Recent Labs    01/08/20 1157 01/08/20 1158  VITAMINB12 766  --   FOLATE  --  17.0   Sepsis Labs: Recent Labs  Lab 01/07/20 2153  LATICACIDVEN 1.2    Recent Results (from the past 240 hour(s))  Resp Panel by RT-PCR (Flu A&B, Covid) Nasopharyngeal Swab     Status: None   Collection Time: 01/07/20 11:25 PM   Specimen: Nasopharyngeal Swab; Nasopharyngeal(NP) swabs in vial transport medium  Result Value Ref Range Status   SARS Coronavirus 2 by RT PCR NEGATIVE NEGATIVE Final    Comment: (NOTE) SARS-CoV-2 target nucleic acids are NOT DETECTED.  The SARS-CoV-2 RNA is generally detectable in upper respiratory specimens during the acute phase of infection. The lowest concentration of SARS-CoV-2 viral copies this assay can detect is 138 copies/mL. A negative result does not preclude SARS-Cov-2 infection and should not be used as the sole basis for treatment or other patient management decisions. A negative result may occur with  improper specimen collection/handling, submission of specimen other than  nasopharyngeal swab, presence of viral mutation(s) within the areas targeted by this assay, and inadequate number of viral copies(<138 copies/mL). A negative result must be combined with clinical observations, patient history, and epidemiological information. The expected result is Negative.  Fact Sheet for Patients:  EntrepreneurPulse.com.au  Fact Sheet for Healthcare Providers:  IncredibleEmployment.be  This test is no t yet approved or cleared by the [place] FDA and  has been authorized for detection and/or diagnosis of SARS-CoV-2 by FDA under an Emergency Use Authorization (EUA). This EUA will remain  in effect (meaning this test can be used) for the duration of the COVID-19 declaration under Section 564(b)(1) of the Act, 21 U.S.C.section 360bbb-3(b)(1), unless the authorization is terminated  or revoked sooner.       Influenza A by PCR NEGATIVE NEGATIVE Final   Influenza B by PCR NEGATIVE NEGATIVE Final    Comment: (NOTE) The Xpert Xpress SARS-CoV-2/FLU/RSV plus assay is intended as an aid in the diagnosis of influenza from Nasopharyngeal swab specimens and should not be used as a sole basis for treatment. Nasal washings and aspirates are unacceptable for Xpert Xpress SARS-CoV-2/FLU/RSV testing.  Fact Sheet for Patients: EntrepreneurPulse.com.au  Fact Sheet for Healthcare Providers: IncredibleEmployment.be  This test is not yet approved or cleared by the [place] FDA and has been authorized for detection and/or diagnosis of SARS-CoV-2 by FDA under an Emergency Use Authorization (EUA). This EUA will remain in effect (meaning this test can be used) for the duration of the COVID-19 declaration under Section 564(b)(1) of the Act, 21 U.S.C. section 360bbb-3(b)(1), unless the authorization is terminated or revoked.  Performed at [facility], [place] [address],  [place],  [address]       Radiology Studies: MR BRAIN WO CONTRAST  Result Date: 01/09/2020 CLINICAL DATA:  Stroke follow-up EXAM: MRI HEAD WITHOUT CONTRAST TECHNIQUE: Multiplanar, multiecho pulse sequences of the brain and surrounding structures were obtained without intravenous contrast. COMPARISON:  None. FINDINGS: Diffusion-weighted imaging shows no acute infarct. There is no midline shift or other mass generalized volume loss. IMPRESSION: No acute infarct. Electronically Signed   By: Ulyses Jarred M.D.   On: 01/09/2020 21:56   MR CERVICAL SPINE WO CONTRAST  Result Date: 01/09/2020 CLINICAL DATA:  Cervical osteoarthritis EXAM: MRI  CERVICAL SPINE WITHOUT CONTRAST TECHNIQUE: Multiplanar, multisequence MR imaging of the cervical spine was performed. No intravenous contrast was administered. COMPARISON:  None. FINDINGS: Alignment: Normal Vertebrae: No acute abnormality. Cord: Motion degraded examination but no visible cord abnormality. Posterior Fossa, vertebral arteries, paraspinal tissues: Abnormal left vertebral artery flow void. Disc levels: Motion degraded axial images limit evaluation of the neural foramina. C2-3: No spinal canal or neural foraminal stenosis. C3-4: Large right subarticular osteophyte. Severe right foraminal stenosis. No spinal canal stenosis. C4-5: Right uncovertebral disc osteophyte complex with moderate right foraminal stenosis. C5-6: Right-greater-than-left uncovertebral hypertrophy. Moderate right and mild left foraminal stenosis. C6-7: Disc bulge with endplate spurring no spinal canal stenosis. Mild left foraminal stenosis. C7-T1: No spinal canal or neural foraminal stenosis. IMPRESSION: 1. Motion degraded examination. 2. Severe right C3-4 neural foraminal stenosis due to large right subarticular osteophyte. 3. Moderate right C4-5 and C5-6 neural foraminal stenosis due to combination of uncovertebral hypertrophy and disc disease. 4. Abnormal left vertebral artery flow void  may indicate slow flow or occlusion. Electronically Signed   By: Ulyses Jarred M.D.   On: 01/09/2020 22:13   DG CHEST PORT 1 VIEW  Result Date: 01/10/2020 CLINICAL DATA:  Weakness, cough EXAM: PORTABLE CHEST 1 VIEW COMPARISON:  Chest x-ray dated 01/08/2020. FINDINGS: Heart size is upper normal. Persistent opacity at the LEFT costophrenic angle. Lungs otherwise clear. No pneumothorax is seen. IMPRESSION: LEFT basilar opacity, stable, most likely small pleural effusion and/or atelectasis. Recommend follow-up chest x-ray to ensure resolution. Electronically Signed   By: Franki Cabot M.D.   On: 01/10/2020 06:44   DG Swallowing Func-Speech Pathology  Result Date: 01/09/2020 Objective Swallowing Evaluation: Type of Study: MBS-Modified Barium Swallow Study  Patient Details Name: Michael Ware MRN: [id number] Date of Birth: Jan 25, 1932 Today's Date: 01/09/2020 Time: SLP Start Time (ACUTE ONLY): 1400 -SLP Stop Time (ACUTE ONLY): 1417 SLP Time Calculation (min) (ACUTE ONLY): 17 min Past Medical History: Past Medical History: Diagnosis Date . Acute GI bleeding 01/19/13  most likely due to diverticulosis . BPH (benign prostatic hypertrophy) with urinary obstruction  . Bradycardia  . Diverticulosis  . Elevated hemoglobin A1c  . Hyperglycemia  . Hyperlipidemia  . Hypertension  . Internal hemorrhoids with bleeding and fecal soiling 11/19/2014 . Rectal bleed 01/19/13 . Vitamin D deficiency  Past Surgical History: Past Surgical History: Procedure Laterality Date . ANAL FISSURE REPAIR  1980 . HEMORRHOID BANDING   . ORIF HUMERUS FRACTURE Right 11/07/2019  Procedure: OPEN REDUCTION INTERNAL FIXATION (ORIF) DISTAL HUMERUS FRACTURE;  Surgeon: Justice Britain, MD;  Location: [facility];  Service: Orthopedics;  Laterality: Right; . rectal fissure procedure  1970's . ROTATOR CUFF REPAIR    bilateral . TONSILLECTOMY   HPI: Michael Ware is a 86 y.o. male with medical history significant of bradycardia, HTN, HLD. Presenting with BLE weakness,  confusion, and right facial droop. MRI Senescent changes without acute or reversible finding.  No data recorded Assessment / Plan / Recommendation CHL IP CLINICAL IMPRESSIONS 01/09/2020 Clinical Impression Pt exhibited mild oropharyngeal dysphagia with trace penetration. Decreased oral containment with right labial leakage and reduced labial seal on cup. Pt's hyolaryngeal elevation, ROM and timing of protective mechanisms was adequate. While pt is masticating the cracker moves over tongue filling vallecuale and pyriform sinuses and pt distracted by dental/oral residue attempting to remove and not initiating swallow. When requested to initiate swallow sooner he was unable but when reflexive swallow was triggered, pharyngeal residue from regular texture was penetrated into vestibule above vocal  cords. He produced throat clear to expel majority. Several instances of throat clear/coughs without barium observed in vestibule or below cords. Puree textures allowed for easier transit without oral residue and more timelier swallow. Recommend Dys 1 (puree), thin liquids, pill whole in pudding, straws allowed, ensure swallow prior to next bite. ST will follow.  SLP Visit Diagnosis Dysphagia, oropharyngeal phase (R13.12) Attention and concentration deficit following -- Frontal lobe and executive function deficit following -- Impact on safety and function Mild aspiration risk;Moderate aspiration risk   CHL IP TREATMENT RECOMMENDATION 01/09/2020 Treatment Recommendations Therapy as outlined in treatment plan below   Prognosis 01/09/2020 Prognosis for Safe Diet Advancement Good Barriers to Reach Goals -- Barriers/Prognosis Comment -- CHL IP DIET RECOMMENDATION 01/09/2020 SLP Diet Recommendations Dysphagia 1 (Puree) solids;Thin liquid Liquid Administration via Cup;Straw Medication Administration Whole meds with puree Compensations Minimize environmental distractions;Slow rate;Small sips/bites;Multiple dry swallows after each bite/sip  Postural Changes Seated upright at 90 degrees   CHL IP OTHER RECOMMENDATIONS 01/09/2020 Recommended Consults -- Oral Care Recommendations Oral care BID Other Recommendations --   CHL IP FOLLOW UP RECOMMENDATIONS 01/09/2020 Follow up Recommendations Other (comment)   CHL IP FREQUENCY AND DURATION 01/09/2020 Speech Therapy Frequency (ACUTE ONLY) min 2x/week Treatment Duration 2 weeks      CHL IP ORAL PHASE 01/09/2020 Oral Phase Impaired Oral - Pudding Teaspoon -- Oral - Pudding Cup -- Oral - Honey Teaspoon -- Oral - Honey Cup -- Oral - Nectar Teaspoon -- Oral - Nectar Cup WFL Oral - Nectar Straw -- Oral - Thin Teaspoon -- Oral - Thin Cup Right anterior bolus loss Oral - Thin Straw WFL Oral - Puree WFL Oral - Mech Soft -- Oral - Regular Lingual/palatal residue;Delayed oral transit Oral - Multi-Consistency -- Oral - Pill -- Oral Phase - Comment --  CHL IP PHARYNGEAL PHASE 01/09/2020 Pharyngeal Phase Impaired Pharyngeal- Pudding Teaspoon -- Pharyngeal -- Pharyngeal- Pudding Cup -- Pharyngeal -- Pharyngeal- Honey Teaspoon -- Pharyngeal -- Pharyngeal- Honey Cup -- Pharyngeal -- Pharyngeal- Nectar Teaspoon -- Pharyngeal -- Pharyngeal- Nectar Cup Pharyngeal residue - valleculae Pharyngeal -- Pharyngeal- Nectar Straw -- Pharyngeal -- Pharyngeal- Thin Teaspoon -- Pharyngeal -- Pharyngeal- Thin Cup Pharyngeal residue - valleculae Pharyngeal -- Pharyngeal- Thin Straw Pharyngeal residue - valleculae Pharyngeal -- Pharyngeal- Puree WFL Pharyngeal -- Pharyngeal- Mechanical Soft -- Pharyngeal -- Pharyngeal- Regular Delayed swallow initiation-pyriform sinuses;Penetration/Aspiration during swallow Pharyngeal -- Pharyngeal- Multi-consistency -- Pharyngeal -- Pharyngeal- Pill -- Pharyngeal -- Pharyngeal Comment --  CHL IP CERVICAL ESOPHAGEAL PHASE 01/09/2020 Cervical Esophageal Phase WFL Pudding Teaspoon -- Pudding Cup -- Honey Teaspoon -- Honey Cup -- Nectar Teaspoon -- Nectar Cup -- Nectar Straw -- Thin Teaspoon -- Thin Cup -- Thin Straw --  Puree -- Mechanical Soft -- Regular -- Multi-consistency -- Pill -- Cervical Esophageal Comment -- [place] Siren 01/09/2020, 4:32 PM  Orbie Pyo Litaker M.Ed Actor Pager [phone] Office [phone]                Scheduled Meds: . FLUoxetine  10 mg Oral Daily  . mouth rinse  15 mL Mouth Rinse BID  . pravastatin  40 mg Oral Daily  . rivaroxaban  20 mg Oral Q supper   Continuous Infusions: . ampicillin-sulbactam (UNASYN) IV 3 g (01/11/20 0521)     LOS: 2 days      Time spent: 25 minutes   Dessa Phi, DO [facility] 01/11/2020, 10:13 AM   Available via Epic secure chat 7am-7pm After these hours, please refer to coverage  provider listed on [email or web address]

## 2020-01-12 ENCOUNTER — Other Ambulatory Visit (HOSPITAL_COMMUNITY): Payer: Self-pay | Admitting: Internal Medicine

## 2020-01-12 LAB — GLUCOSE, CAPILLARY: Glucose-Capillary: 132 mg/dL — ABNORMAL HIGH (ref 70–99)

## 2020-01-12 MED ORDER — AMOXICILLIN-POT CLAVULANATE 875-125 MG PO TABS: 1.0000 | ORAL_TABLET | Freq: Two times a day (BID) | ORAL | 0 refills | Status: DC

## 2020-01-12 MED FILL — AMOX-CLAV 875-125 MG TABLET: 875-125 | 3 days supply | Qty: 6 | Fill #0

## 2020-01-12 NOTE — Progress Notes (Signed)
PROGRESS NOTE    Michael Ware  PIR:[id number] DOB: Jun 09, 1931 DOA: 01/07/2020 PCP: Alroy Dust, L.Marlou Sa, MD     Brief Narrative:  Michael Ware is a 86 y.o. male with medical history significant of bradycardia, HTN, HLD, PE, DVT, history of hospital delirium due to polypharmacy. He presented with BLE weakness, confusion, and right facial droop.  History per daughter. Has been working with PT at home for several weeks and was doing well. In normal state of health until Monday morning. On Monday morning he seemed to have a harder time with his exercises but generally ok. Tuesday morning, he called for help to get out of bed and his legs "were like noodles". Daughter helped him to the bathroom and back. When she got him to the bed, she noticed that he didn't have the strength to get back into the bed. PT noticed that he was weaker that day. The weakness progressed over the next day. They spoke with his PCP who recommended that he go to walk-in clinic or ED yesterday. The daughter noted that he was having a til to the left. They went to walk-in clinic and they recommended an MRI lumbar spine. They went home and the daughter noticed that he was having difficulty writing and he was more confused. She did what she knew for a stroke test: asked him to speak and smile. She said his speech seemed slurred but his smile was symmetrical. She became concerned and had him transported to the ED.  Work-up was negative for neurologic etiology of his encephalopathy and weakness.  Speech therapy evaluated patient, at risk for dysphagia and aspiration.  Chest x-ray revealed left-sided pneumonia and patient was started on Unasyn.  New events last 24 hours / Subjective: Very upset that he was not ambulated yesterday.  Assessment & Plan:   Active Problems:   Lower extremity weakness   Acute metabolic encephalopathy   Acute metabolic encephalopathy and LE weakness likely secondary to pneumonia -Presented with lower  extremity weakness, leaning to the left, slurred speech and right facial droop -CT head without acute intracranial abnormality -MRI brain negative -CTA head and neck without acute intracranial abnormality -MRI thoracic lumbar spine negative for acute finding -Repeat MRI brain negative -MRI cervical spine with findings of moderate to severe neural foraminal stenosis -Neurology work-up has largely been unremarkable.  His symptoms are likely secondary to infectious process -Appreciate neurology -PT OT recommending SNF placement  Acute hypoxemic respiratory failure secondary to pneumonia -SPO2 86% on room air, currently requiring 2 L nasal cannula O2 -Chest x-ray revealed left basilar airspace disease versus accentuation of cardiac fat pad, could represent developing infection -Repeat chest x-ray showed left basilar opacity -Unasyn for possible aspiration pneumonia -Wean oxygen  History of hospital delirium and confusion due to polypharmacy -Limit narcotic use. Has been doing well on tramadol as needed. Has been taking Valium as needed nightly for years.  History of PE, DVT -Continue Xarelto  Hyperlipidemia -Continue pravachol  Anxiety -Continue Prozac  In agreement with assessment of the pressure ulcer as below:  Pressure Injury 01/08/20 Sacrum Medial (Active)  01/08/20 2300  Location: Sacrum  Location Orientation: Medial  Staging:   Wound Description (Comments):   Present on Admission: Yes         DVT prophylaxis:   rivaroxaban (XARELTO) tablet 20 mg  Code Status: DNR Family Communication: No family at bedside Disposition Plan:  Status is: Inpatient  Remains inpatient appropriate because:Unsafe d/c plan   Dispo: The patient is  from: Home              Anticipated d/c is to: SNF              Anticipated d/c date is: 1 day              Patient currently is medically stable to d/c.  SNF placement pending   Consultants:   Neurology  Procedures:   None   Antimicrobials:  Anti-infectives (From admission, onward)   Start     Dose/Rate Route Frequency Ordered Stop   01/09/20 1400  Ampicillin-Sulbactam (UNASYN) 3 g in sodium chloride 0.9 % 100 mL IVPB        3 g 200 mL/hr over 30 Minutes Intravenous Every 8 hours 01/09/20 1253         Objective: Vitals:   01/11/20 0829 01/11/20 1443 01/11/20 2347 01/12/20 0604  BP: (!) 128/51 (!) 132/52 (!) 134/55 (!) 134/55  Pulse: 64 63 60 62  Resp: 18 18 16 18   Temp: 98.3 F (36.8 C) 98.6 F (37 C) 97.9 F (36.6 C) 97.6 F (36.4 C)  TempSrc:   Oral Oral  SpO2: 93% 95% 96% 95%  Weight:      Height:        Intake/Output Summary (Last 24 hours) at 01/12/2020 0950 Last data filed at 01/11/2020 2150 Gross per 24 hour  Intake -  Output 500 ml  Net -500 ml   Filed Weights   01/10/20 1700  Weight: 88.1 kg     Examination: General exam: Appears calm and comfortable  Respiratory system: Clear to auscultation. Respiratory effort normal.  On nasal cannula O2 Cardiovascular system: S1 & S2 heard, RRR. No pedal edema. Gastrointestinal system: Abdomen is nondistended, soft and nontender. Normal bowel sounds heard. Central nervous system: Alert and oriented. Non focal exam. Speech clear  Extremities: Symmetric in appearance bilaterally  Skin: No rashes, lesions or ulcers on exposed skin  Psychiatry: Judgement and insight appear stable. Mood & affect appropriate.    Data Reviewed: I have personally reviewed following labs and imaging studies  CBC: Recent Labs  Lab 01/07/20 2153 01/10/20 0113  WBC 6.8 7.0  NEUTROABS 4.1  --   HGB 14.4 13.9  HCT 42.8 41.3  MCV 105.2* 101.5*  PLT 245 703   Basic Metabolic Panel: Recent Labs  Lab 01/07/20 2153 01/10/20 0113  NA 139 136  K 3.8 3.8  CL 101 99  CO2 28 29  GLUCOSE 100* 109*  BUN 12 10  CREATININE 0.97 1.05  CALCIUM 9.0 8.8*   GFR: Estimated Creatinine Clearance: 54.5 mL/min (by C-G formula based on SCr of 1.05 mg/dL). Liver  Function Tests: Recent Labs  Lab 01/07/20 2153 01/10/20 0113  AST 34 29  ALT 27 23  ALKPHOS 94 89  BILITOT 0.7 0.5  PROT 6.5 5.5*  ALBUMIN 3.6 2.9*   No results for input(s): LIPASE, AMYLASE in the last 168 hours. Recent Labs  Lab 01/10/20 0113  AMMONIA 34   Coagulation Profile: No results for input(s): INR, PROTIME in the last 168 hours. Cardiac Enzymes: No results for input(s): CKTOTAL, CKMB, CKMBINDEX, TROPONINI in the last 168 hours. BNP (last 3 results) No results for input(s): PROBNP in the last 8760 hours. HbA1C: Recent Labs    01/09/20 1222  HGBA1C 4.9   CBG: Recent Labs  Lab 01/09/20 2111  GLUCAP 133*   Lipid Profile: Recent Labs    01/10/20 0113  CHOL 120  HDL 26*  LDLCALC  70  TRIG 120  CHOLHDL 4.6   Thyroid Function Tests: No results for input(s): TSH, T4TOTAL, FREET4, T3FREE, THYROIDAB in the last 72 hours. Anemia Panel: No results for input(s): VITAMINB12, FOLATE, FERRITIN, TIBC, IRON, RETICCTPCT in the last 72 hours. Sepsis Labs: Recent Labs  Lab 01/07/20 2153  LATICACIDVEN 1.2    Recent Results (from the past 240 hour(s))  Resp Panel by RT-PCR (Flu A&B, Covid) Nasopharyngeal Swab     Status: None   Collection Time: 01/07/20 11:25 PM   Specimen: Nasopharyngeal Swab; Nasopharyngeal(NP) swabs in vial transport medium  Result Value Ref Range Status   SARS Coronavirus 2 by RT PCR NEGATIVE NEGATIVE Final    Comment: (NOTE) SARS-CoV-2 target nucleic acids are NOT DETECTED.  The SARS-CoV-2 RNA is generally detectable in upper respiratory specimens during the acute phase of infection. The lowest concentration of SARS-CoV-2 viral copies this assay can detect is 138 copies/mL. A negative result does not preclude SARS-Cov-2 infection and should not be used as the sole basis for treatment or other patient management decisions. A negative result may occur with  improper specimen collection/handling, submission of specimen other than  nasopharyngeal swab, presence of viral mutation(s) within the areas targeted by this assay, and inadequate number of viral copies(<138 copies/mL). A negative result must be combined with clinical observations, patient history, and epidemiological information. The expected result is Negative.  Fact Sheet for Patients:  EntrepreneurPulse.com.au  Fact Sheet for Healthcare Providers:  IncredibleEmployment.be  This test is no t yet approved or cleared by the [place] FDA and  has been authorized for detection and/or diagnosis of SARS-CoV-2 by FDA under an Emergency Use Authorization (EUA). This EUA will remain  in effect (meaning this test can be used) for the duration of the COVID-19 declaration under Section 564(b)(1) of the Act, 21 U.S.C.section 360bbb-3(b)(1), unless the authorization is terminated  or revoked sooner.       Influenza A by PCR NEGATIVE NEGATIVE Final   Influenza B by PCR NEGATIVE NEGATIVE Final    Comment: (NOTE) The Xpert Xpress SARS-CoV-2/FLU/RSV plus assay is intended as an aid in the diagnosis of influenza from Nasopharyngeal swab specimens and should not be used as a sole basis for treatment. Nasal washings and aspirates are unacceptable for Xpert Xpress SARS-CoV-2/FLU/RSV testing.  Fact Sheet for Patients: EntrepreneurPulse.com.au  Fact Sheet for Healthcare Providers: IncredibleEmployment.be  This test is not yet approved or cleared by the [place] FDA and has been authorized for detection and/or diagnosis of SARS-CoV-2 by FDA under an Emergency Use Authorization (EUA). This EUA will remain in effect (meaning this test can be used) for the duration of the COVID-19 declaration under Section 564(b)(1) of the Act, 21 U.S.C. section 360bbb-3(b)(1), unless the authorization is terminated or revoked.  Performed at [facility], [place] [address]  [place], [place] [address]       Radiology Studies: No results found.    Scheduled Meds: . FLUoxetine  10 mg Oral Daily  . mouth rinse  15 mL Mouth Rinse BID  . pravastatin  40 mg Oral Daily  . rivaroxaban  20 mg Oral Q supper   Continuous Infusions: . ampicillin-sulbactam (UNASYN) IV 3 g (01/12/20 0606)     LOS: 3 days      Time spent: 25 minutes   Dessa Phi, DO [facility] 01/12/2020, 9:50 AM   Available via Epic secure chat 7am-7pm After these hours, please refer to coverage provider listed on [email or web address]

## 2020-01-12 NOTE — Discharge Summary (Signed)
Physician Discharge Summary  Michael Ware IRJ:[id number] DOB: [birth year] DOA: 01/07/2020  PCP: Michael Ware, L.Marlou Sa, MD  Admit date: 01/07/2020 Discharge date: 01/12/2020  Admitted From: Home Disposition:  Home with home health   Recommendations for Outpatient Follow-up:  1. Follow up with PCP in 1 week  Discharge Condition: Stable CODE STATUS: DNR  Diet recommendation: Dysphagia 1   Brief/Interim Summary: Michael Ware a 86 y.o.malewith medical history significant ofbradycardia, HTN, HLD, PE, DVT, history of hospital delirium due to polypharmacy. He presented with BLE weakness, confusion, and right facial droop. History per daughter. Has been working with PT at home for several weeks and was doing well. In normal state of health until Monday morning. On Monday morning he seemed to have a harder time with his exercises but generally ok. Tuesday morning, he called for help to get out of bed and his legs "were like noodles". Daughter helped him to the bathroom and back. When she got him to the bed, she noticed that he didn't have the strength to get back into the bed. PT noticed that he was weaker that day. The weakness progressed over the next day. They spoke with his PCP who recommended that he go to walk-in clinic or ED yesterday. The daughter noted that he was having a til to the left. They went to walk-in clinic and they recommended an MRI lumbar spine. They went home and the daughter noticed that he was having difficulty writing and he was more confused. She did what she knew for a stroke test: asked him to speak and smile. She said his speech seemed slurred but his smile was symmetrical. She became concerned and had him transported to the ED. Work-up was negative for neurologic etiology of his encephalopathy and weakness.  Speech therapy evaluated patient, at risk for dysphagia and aspiration.  Chest x-ray revealed left-sided pneumonia and patient was started on Unasyn.  Stroke work-up was  largely unremarkable.  Neurology felt that his symptoms were likely secondary to infectious process.  Patient improved back to his baseline and was discharged home with home health.  Discharge Diagnoses:  Active Problems:   Lower extremity weakness   Acute metabolic encephalopathy   Acute metabolic encephalopathy and LE weakness likely secondary to pneumonia -Presented with lower extremity weakness, leaning to the left, slurred speech and right facial droop -CT head without acute intracranial abnormality -MRI brain negative -CTA head and neck without acute intracranial abnormality -MRI thoracic lumbar spine negative for acute finding -Repeat MRI brain negative -MRI cervical spine with findings of moderate to severe neural foraminal stenosis -Neurology work-up has largely been unremarkable.  His symptoms are likely secondary to infectious process -Appreciate neurology -PT OT recommending home health   Acute hypoxemic respiratory failure secondary to pneumonia -SPO2 86% on room air on admission -Chest x-ray revealed left basilar airspace disease versus accentuation of cardiac fat pad, could represent developing infection -Repeat chest x-ray showed left basilar opacity -Unasyn for possible aspiration pneumonia --> Augmentin on discharge  History of hospital delirium and confusion due to polypharmacy -Limit narcotic use. Has been doing well on tramadol as needed. Has been taking Valium as needed nightly for years.  History of PE, DVT -Continue Xarelto  Hyperlipidemia -Continue pravachol  Anxiety -Continue Prozac   In agreement with assessment of the pressure ulcer as below: Pressure Injury 01/08/20 Sacrum Medial (Active)  01/08/20 2300  Location: Sacrum  Location Orientation: Medial  Staging:   Wound Description (Comments):   Present on Admission: Yes  Discharge Instructions  Discharge Instructions    Call MD for:  difficulty breathing, headache or visual  disturbances   Complete by: As directed    Call MD for:  extreme fatigue   Complete by: As directed    Call MD for:  hives   Complete by: As directed    Call MD for:  persistant dizziness or light-headedness   Complete by: As directed    Call MD for:  persistant nausea and vomiting   Complete by: As directed    Call MD for:  severe uncontrolled pain   Complete by: As directed    Call MD for:  temperature >100.4   Complete by: As directed    Discharge instructions   Complete by: As directed    You were cared for by a hospitalist during your hospital stay. If you have any questions about your discharge medications or the care you received while you were in the hospital after you are discharged, you can call the unit and ask to speak with the hospitalist on call if the hospitalist that took care of you is not available. Once you are discharged, your primary care physician will handle any further medical issues. Please note that NO REFILLS for any discharge medications will be authorized once you are discharged, as it is imperative that you return to your primary care physician (or establish a relationship with a primary care physician if you do not have one) for your aftercare needs so that they can reassess your need for medications and monitor your lab values.   Increase activity slowly   Complete by: As directed    No wound care   Complete by: As directed      Allergies as of 01/12/2020      Reactions   Atenolol Other (See Comments)   Caused heart rate to drop Other reaction(s): Other Caused heart rate to drop   Morphine And Related Other (See Comments)   hallucinations   Trazamine [trazodone & Diet Manage Prod] Other (See Comments)   Dry mouth      Medication List    TAKE these medications   amoxicillin-clavulanate 875-125 MG tablet Commonly known as: Augmentin Take 1 tablet by mouth 2 (two) times daily for 3 days.   diazepam 10 MG tablet Commonly known as: VALIUM Take 10  mg by mouth daily as needed for sleep.   FLUoxetine 10 MG capsule Commonly known as: PROZAC Take 10 mg by mouth daily.   MELATONIN PO Take 1 tablet by mouth at bedtime as needed (sleep).   pravastatin 40 MG tablet Commonly known as: PRAVACHOL Take 40 mg by mouth daily.   rivaroxaban 20 MG Tabs tablet Commonly known as: XARELTO Take 20 mg by mouth daily. What changed: Another medication with the same name was removed. Continue taking this medication, and follow the directions you see here.   traMADol 50 MG tablet Commonly known as: ULTRAM Take 50 mg by mouth every 6 (six) hours as needed for moderate pain.       Follow-up Information    Michael Ware, L.Marlou Sa, MD. Schedule an appointment as soon as possible for a visit in 1 week(s).   Specialty: Family Medicine Contact information: [address] [place]  [address] [phone]              Allergies  Allergen Reactions  . Atenolol Other (See Comments)    Caused heart rate to drop Other reaction(s): Other Caused heart rate to drop  .  Morphine And Related Other (See Comments)    hallucinations  . Trazamine [Trazodone & Diet Manage Prod] Other (See Comments)    Dry mouth    Consultations:  None    Procedures/Studies: CT ANGIO HEAD W OR WO CONTRAST  Result Date: 01/08/2020 CLINICAL DATA:  Leg weakness EXAM: CT ANGIOGRAPHY HEAD AND NECK TECHNIQUE: Multidetector CT imaging of the head and neck was performed using the standard protocol during bolus administration of intravenous contrast. Multiplanar CT image reconstructions and MIPs were obtained to evaluate the vascular anatomy. Carotid stenosis measurements (when applicable) are obtained utilizing NASCET criteria, using the distal internal carotid diameter as the denominator. CONTRAST:  26mL OMNIPAQUE IOHEXOL 350 MG/ML SOLN COMPARISON:  01/07/2020 CT head FINDINGS: CT HEAD Brain: There is no acute intracranial hemorrhage, mass effect, or edema. No new  loss of gray-white differentiation. There is no extra-axial fluid collection. Patchy and confluent areas of hypoattenuation in the supratentorial white matter likely reflects stable chronic microvascular ischemic changes. 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 Ventricles and sulci are stable in size and configuration. Vascular: There is atherosclerotic calcification at the skull base. Skull: Calvarium is unremarkable. Sinuses/Orbits: No acute finding. Other: None. Review of the MIP images confirms the above findings CTA NECK Aortic arch: Moderate calcified and noncalcified plaque along the arch and at the great vessel origins, which are patent. No high-grade subclavian artery stenosis. Right carotid system: Patent. Mixed plaque along the common carotid causing less than 50% stenosis. Mixed plaque along the proximal internal carotid causing up to 60% stenosis. Left carotid system: Patent. Mixed plaque along the common carotid causing less than 50% stenosis. Mixed plaque along the proximal internal carotid causing less than 50% stenosis. Vertebral arteries: Patent. Right vertebral artery is dominant. Multifocal irregularity and stenosis the left including severe stenosis at the V1-V2 junction. Skeleton: Degenerative changes of the included spine. Other neck: No mass or adenopathy. Upper chest: Emphysema. Review of the MIP images confirms the above findings CTA HEAD Anterior circulation: Intracranial internal carotid arteries are patent with primarily calcified plaque resulting in up to moderate stenosis. Anterior and middle cerebral arteries are patent. Posterior circulation: The intracranial vertebral arteries are patent. There is mild plaque on the right. There is high-grade stenosis or occlusion of the mid left vertebral artery just beyond the PICA origin. Reconstitution distally. Basilar artery is patent. Posterior cerebral arteries are patent. Venous sinuses: Patent as allowed by contrast bolus timing.  Review of the MIP images confirms the above findings IMPRESSION: No acute intracranial abnormality. Plaque along the proximal right internal carotid causes up to 60% stenosis. Plaque along the proximal left internal carotid causes less than 50% stenosis. Multifocal left vertebral artery stenosis including severe stenosis at the V1-V2 junction. Up to moderate stenosis of the intracranial intra carotid arteries. High-grade stenosis or occlusion of the mid left intracranial vertebral artery just beyond the PICA origin with distal reconstitution. Electronically Signed   By: Macy Mis M.D.   On: 01/08/2020 10:37   CT Head Wo Contrast  Result Date: 01/07/2020 CLINICAL DATA:  Daughter is in town to take care of patient. States weakness started on Sunday. Multiple episodes when she has had to lower him to the floor. Pt denies fall or injury. EMS has been to the house multiple time to assist with lifting patient from the floor. Pt is leaning to the left side. Equal grip strength bilaterally. No facial droop or altered sensation. " EXAM: CT HEAD WITHOUT CONTRAST TECHNIQUE: Contiguous axial images  were obtained from the base of the skull through the vertex without intravenous contrast. COMPARISON:  11/12/2019 FINDINGS: Brain: No evidence of acute infarction, hemorrhage, hydrocephalus, extra-axial collection or mass lesion/mass effect. There is ventricular sulcal enlargement reflecting mild to moderate diffuse atrophy. Patchy bilateral white matter hypoattenuation is also noted consistent with moderate chronic microvascular ischemic change. These findings are stable. Vascular: No hyperdense vessel or unexpected calcification. Skull: Normal. Negative for fracture or focal lesion. Sinuses/Orbits: Globes and orbits are unremarkable. Sinuses and mastoid air cells are clear. Other: None. IMPRESSION: 1. No acute intracranial abnormalities. 2. Atrophy and chronic microvascular ischemic change. Stable appearance from the prior  head CT. Electronically Signed   By: Lajean Manes M.D.   On: 01/07/2020 20:51   CT ANGIO NECK W OR WO CONTRAST  Result Date: 01/08/2020 CLINICAL DATA:  Leg weakness EXAM: CT ANGIOGRAPHY HEAD AND NECK TECHNIQUE: Multidetector CT imaging of the head and neck was performed using the standard protocol during bolus administration of intravenous contrast. Multiplanar CT image reconstructions and MIPs were obtained to evaluate the vascular anatomy. Carotid stenosis measurements (when applicable) are obtained utilizing NASCET criteria, using the distal internal carotid diameter as the denominator. CONTRAST:  55mL OMNIPAQUE IOHEXOL 350 MG/ML SOLN COMPARISON:  01/07/2020 CT head FINDINGS: CT HEAD Brain: There is no acute intracranial hemorrhage, mass effect, or edema. No new loss of gray-white differentiation. There is no extra-axial fluid collection. Patchy and confluent areas of hypoattenuation in the supratentorial white matter likely reflects stable chronic microvascular ischemic changes. 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 Ventricles and sulci are stable in size and configuration. Vascular: There is atherosclerotic calcification at the skull base. Skull: Calvarium is unremarkable. Sinuses/Orbits: No acute finding. Other: None. Review of the MIP images confirms the above findings CTA NECK Aortic arch: Moderate calcified and noncalcified plaque along the arch and at the great vessel origins, which are patent. No high-grade subclavian artery stenosis. Right carotid system: Patent. Mixed plaque along the common carotid causing less than 50% stenosis. Mixed plaque along the proximal internal carotid causing up to 60% stenosis. Left carotid system: Patent. Mixed plaque along the common carotid causing less than 50% stenosis. Mixed plaque along the proximal internal carotid causing less than 50% stenosis. Vertebral arteries: Patent. Right vertebral artery is dominant. Multifocal irregularity and stenosis the  left including severe stenosis at the V1-V2 junction. Skeleton: Degenerative changes of the included spine. Other neck: No mass or adenopathy. Upper chest: Emphysema. Review of the MIP images confirms the above findings CTA HEAD Anterior circulation: Intracranial internal carotid arteries are patent with primarily calcified plaque resulting in up to moderate stenosis. Anterior and middle cerebral arteries are patent. Posterior circulation: The intracranial vertebral arteries are patent. There is mild plaque on the right. There is high-grade stenosis or occlusion of the mid left vertebral artery just beyond the PICA origin. Reconstitution distally. Basilar artery is patent. Posterior cerebral arteries are patent. Venous sinuses: Patent as allowed by contrast bolus timing. Review of the MIP images confirms the above findings IMPRESSION: No acute intracranial abnormality. Plaque along the proximal right internal carotid causes up to 60% stenosis. Plaque along the proximal left internal carotid causes less than 50% stenosis. Multifocal left vertebral artery stenosis including severe stenosis at the V1-V2 junction. Up to moderate stenosis of the intracranial intra carotid arteries. High-grade stenosis or occlusion of the mid left intracranial vertebral artery just beyond the PICA origin with distal reconstitution. Electronically Signed   By: Addison Lank.D.  On: 01/08/2020 10:37   MR BRAIN WO CONTRAST  Result Date: 01/09/2020 CLINICAL DATA:  Stroke follow-up EXAM: MRI HEAD WITHOUT CONTRAST TECHNIQUE: Multiplanar, multiecho pulse sequences of the brain and surrounding structures were obtained without intravenous contrast. COMPARISON:  None. FINDINGS: Diffusion-weighted imaging shows no acute infarct. There is no midline shift or other mass generalized volume loss. IMPRESSION: No acute infarct. Electronically Signed   By: Ulyses Jarred M.D.   On: 01/09/2020 21:56   MR BRAIN WO CONTRAST  Result Date:  01/08/2020 CLINICAL DATA:  Confusion and worsening right lower extremity weakness with ataxia. EXAM: MRI HEAD WITHOUT CONTRAST TECHNIQUE: Multiplanar, multiecho pulse sequences of the brain and surrounding structures were obtained without intravenous contrast. COMPARISON:  Head CT 01/07/2020 FINDINGS: Brain: No acute infarction, hemorrhage, hydrocephalus, extra-axial collection or mass lesion. Chronic small vessel ischemia in the periventricular white matter, mild for age. Cerebral volume loss which is also generalized and mild for age. Vascular: Normal flow voids Skull and upper cervical spine: Normal marrow signal Sinuses/Orbits: Negative IMPRESSION: Senescent changes without acute or reversible finding. Electronically Signed   By: Monte Fantasia M.D.   On: 01/08/2020 06:30   MR CERVICAL SPINE WO CONTRAST  Result Date: 01/09/2020 CLINICAL DATA:  Cervical osteoarthritis EXAM: MRI CERVICAL SPINE WITHOUT CONTRAST TECHNIQUE: Multiplanar, multisequence MR imaging of the cervical spine was performed. No intravenous contrast was administered. COMPARISON:  None. FINDINGS: Alignment: Normal Vertebrae: No acute abnormality. Cord: Motion degraded examination but no visible cord abnormality. Posterior Fossa, vertebral arteries, paraspinal tissues: Abnormal left vertebral artery flow void. Disc levels: Motion degraded axial images limit evaluation of the neural foramina. C2-3: No spinal canal or neural foraminal stenosis. C3-4: Large right subarticular osteophyte. Severe right foraminal stenosis. No spinal canal stenosis. C4-5: Right uncovertebral disc osteophyte complex with moderate right foraminal stenosis. C5-6: Right-greater-than-left uncovertebral hypertrophy. Moderate right and mild left foraminal stenosis. C6-7: Disc bulge with endplate spurring no spinal canal stenosis. Mild left foraminal stenosis. C7-T1: No spinal canal or neural foraminal stenosis. IMPRESSION: 1. Motion degraded examination. 2. Severe right  C3-4 neural foraminal stenosis due to large right subarticular osteophyte. 3. Moderate right C4-5 and C5-6 neural foraminal stenosis due to combination of uncovertebral hypertrophy and disc disease. 4. Abnormal left vertebral artery flow void may indicate slow flow or occlusion. Electronically Signed   By: Ulyses Jarred M.D.   On: 01/09/2020 22:13   MR THORACIC SPINE WO CONTRAST  Result Date: 01/08/2020 CLINICAL DATA:  Cord compression, a tracks E a. Nontraumatic. Worsening right lower extremity weakness and ataxia. EXAM: MRI THORACIC SPINE WITHOUT CONTRAST TECHNIQUE: Multiplanar, multisequence MR imaging of the thoracic spine was performed. No intravenous contrast was administered. COMPARISON:  Chest CT from 11/10/2019 FINDINGS: Alignment:  Negative for listhesis. Vertebrae: No fracture, evidence of discitis, or bone lesion. Cord:  Normal signal and morphology. Paraspinal and other soft tissues: Marked fatty atrophy of intrinsic back muscles throughout the thoracic spine. Disc levels: Generalized disc desiccation and narrowing. Spondylitic spurring throughout the mid to lower thoracic spine. Negative facets. No neural compression. IMPRESSION: 1. No acute finding.  Negative MRI of the thoracic cord. 2. Generalized thoracic spondylosis. 3. Prominent axial muscular atrophy. Electronically Signed   By: Monte Fantasia M.D.   On: 01/08/2020 07:06   MR LUMBAR SPINE WO CONTRAST  Result Date: 01/08/2020 CLINICAL DATA:  Lumbar radiculopathy. Worse on right lower extremity weakness and a core slight EXAM: MRI LUMBAR SPINE WITHOUT CONTRAST TECHNIQUE: Multiplanar, multisequence MR imaging of the lumbar spine was performed.  No intravenous contrast was administered. COMPARISON:  Lumbar myelogram 8 chorion FINDINGS: Segmentation:  Transitional S1 vertebra. Alignment:  Levoscoliosis and slight L5-S1 retrolisthesis Vertebrae:  No fracture, evidence of discitis, or bone lesion. Conus medullaris and cauda equina: Conus  extends to the T12-L1 level. Conus and cauda equina appear normal. Paraspinal and other soft tissues: Prominent fatty atrophy of intrinsic back muscles CT Disc levels: L1-L2: Mild spondylosis with bridging osteophyte. L2-L3: Spondylosis.  No impingement L3-L4: Spondylosis with ventral spurring.  No neural impingement L4-L5: . For disc narrowing back bulky left far-lateral marginal spurring. No neural compression. L5-S1:Disc narrowing with bulky left far-lateral spurring. The annulus is fissured posteriorly and left lateral. Asymmetric left facet spurring. Noncompressive left foraminal narrowing. S1-2: Asymmetric left facet spurring. Left far-lateral spurring. No neural compression. IMPRESSION: 1. No acute finding. No neural compression on the symptomatic right side. 2. Spondylosis and degenerative disease with scoliosis that is very similar to 2014. 3. Prominent fatty atrophy of intrinsic back muscles. 4. Transitional S1 vertebra. Electronically Signed   By: Monte Fantasia M.D.   On: 01/08/2020 07:22   DG CHEST PORT 1 VIEW  Result Date: 01/10/2020 CLINICAL DATA:  Weakness, cough EXAM: PORTABLE CHEST 1 VIEW COMPARISON:  Chest x-ray dated 01/08/2020. FINDINGS: Heart size is upper normal. Persistent opacity at the LEFT costophrenic angle. Lungs otherwise clear. No pneumothorax is seen. IMPRESSION: LEFT basilar opacity, stable, most likely small pleural effusion and/or atelectasis. Recommend follow-up chest x-ray to ensure resolution. Electronically Signed   By: Franki Cabot M.D.   On: 01/10/2020 06:44   DG CHEST PORT 1 VIEW  Result Date: 01/08/2020 CLINICAL DATA:  86 year old male with cerebrovascular accident presenting with confusion and RIGHT lower extremity weakness and ataxia EXAM: PORTABLE CHEST 1 VIEW COMPARISON:  November 10, 2019 FINDINGS: Image rotated to the LEFT. Accounting for this cardiomediastinal contours are stable. The LEFT basilar airspace disease with partial obscuration of LEFT  hemidiaphragm versus is accentuation of cardiac fat pad due to rotation projecting over this area. RIGHT lung is clear. On limited assessment no acute skeletal process. IMPRESSION: LEFT basilar airspace disease versus accentuation of cardiac fat pad. Could represent developing infection. Continued follow-up radiographs without rotation may be helpful to exclude the possibility of developing infection in this location. Electronically Signed   By: Zetta Bills M.D.   On: 01/08/2020 09:28   DG Swallowing Func-Speech Pathology  Result Date: 01/09/2020 Objective Swallowing Evaluation: Type of Study: MBS-Modified Barium Swallow Study  Patient Details Name: Michael Ware MRN: [id number] Date of Birth: 12-Jan-1932 Today's Date: 01/09/2020 Time: SLP Start Time (ACUTE ONLY): 1400 -SLP Stop Time (ACUTE ONLY): 1417 SLP Time Calculation (min) (ACUTE ONLY): 17 min Past Medical History: Past Medical History: Diagnosis Date . Acute GI bleeding 01/19/13  most likely due to diverticulosis . BPH (benign prostatic hypertrophy) with urinary obstruction  . Bradycardia  . Diverticulosis  . Elevated hemoglobin A1c  . Hyperglycemia  . Hyperlipidemia  . Hypertension  . Internal hemorrhoids with bleeding and fecal soiling 11/19/2014 . Rectal bleed 01/19/13 . Vitamin D deficiency  Past Surgical History: Past Surgical History: Procedure Laterality Date . ANAL FISSURE REPAIR  1980 . HEMORRHOID BANDING   . ORIF HUMERUS FRACTURE Right 11/07/2019  Procedure: OPEN REDUCTION INTERNAL FIXATION (ORIF) DISTAL HUMERUS FRACTURE;  Surgeon: Justice Britain, MD;  Location: [facility];  Service: Orthopedics;  Laterality: Right; . rectal fissure procedure  1970's . ROTATOR CUFF REPAIR    bilateral . TONSILLECTOMY   HPI: Michael Ware is  a 86 y.o. male with medical history significant of bradycardia, HTN, HLD. Presenting with BLE weakness, confusion, and right facial droop. MRI Senescent changes without acute or reversible finding.  No data recorded Assessment /  Plan / Recommendation CHL IP CLINICAL IMPRESSIONS 01/09/2020 Clinical Impression Pt exhibited mild oropharyngeal dysphagia with trace penetration. Decreased oral containment with right labial leakage and reduced labial seal on cup. Pt's hyolaryngeal elevation, ROM and timing of protective mechanisms was adequate. While pt is masticating the cracker moves over tongue filling vallecuale and pyriform sinuses and pt distracted by dental/oral residue attempting to remove and not initiating swallow. When requested to initiate swallow sooner he was unable but when reflexive swallow was triggered, pharyngeal residue from regular texture was penetrated into vestibule above vocal cords. He produced throat clear to expel majority. Several instances of throat clear/coughs without barium observed in vestibule or below cords. Puree textures allowed for easier transit without oral residue and more timelier swallow. Recommend Dys 1 (puree), thin liquids, pill whole in pudding, straws allowed, ensure swallow prior to next bite. ST will follow.  SLP Visit Diagnosis Dysphagia, oropharyngeal phase (R13.12) Attention and concentration deficit following -- Frontal lobe and executive function deficit following -- Impact on safety and function Mild aspiration risk;Moderate aspiration risk   CHL IP TREATMENT RECOMMENDATION 01/09/2020 Treatment Recommendations Therapy as outlined in treatment plan below   Prognosis 01/09/2020 Prognosis for Safe Diet Advancement Good Barriers to Reach Goals -- Barriers/Prognosis Comment -- CHL IP DIET RECOMMENDATION 01/09/2020 SLP Diet Recommendations Dysphagia 1 (Puree) solids;Thin liquid Liquid Administration via Cup;Straw Medication Administration Whole meds with puree Compensations Minimize environmental distractions;Slow rate;Small sips/bites;Multiple dry swallows after each bite/sip Postural Changes Seated upright at 90 degrees   CHL IP OTHER RECOMMENDATIONS 01/09/2020 Recommended Consults -- Oral Care  Recommendations Oral care BID Other Recommendations --   CHL IP FOLLOW UP RECOMMENDATIONS 01/09/2020 Follow up Recommendations Other (comment)   CHL IP FREQUENCY AND DURATION 01/09/2020 Speech Therapy Frequency (ACUTE ONLY) min 2x/week Treatment Duration 2 weeks      CHL IP ORAL PHASE 01/09/2020 Oral Phase Impaired Oral - Pudding Teaspoon -- Oral - Pudding Cup -- Oral - Honey Teaspoon -- Oral - Honey Cup -- Oral - Nectar Teaspoon -- Oral - Nectar Cup WFL Oral - Nectar Straw -- Oral - Thin Teaspoon -- Oral - Thin Cup Right anterior bolus loss Oral - Thin Straw WFL Oral - Puree WFL Oral - Mech Soft -- Oral - Regular Lingual/palatal residue;Delayed oral transit Oral - Multi-Consistency -- Oral - Pill -- Oral Phase - Comment --  CHL IP PHARYNGEAL PHASE 01/09/2020 Pharyngeal Phase Impaired Pharyngeal- Pudding Teaspoon -- Pharyngeal -- Pharyngeal- Pudding Cup -- Pharyngeal -- Pharyngeal- Honey Teaspoon -- Pharyngeal -- Pharyngeal- Honey Cup -- Pharyngeal -- Pharyngeal- Nectar Teaspoon -- Pharyngeal -- Pharyngeal- Nectar Cup Pharyngeal residue - valleculae Pharyngeal -- Pharyngeal- Nectar Straw -- Pharyngeal -- Pharyngeal- Thin Teaspoon -- Pharyngeal -- Pharyngeal- Thin Cup Pharyngeal residue - valleculae Pharyngeal -- Pharyngeal- Thin Straw Pharyngeal residue - valleculae Pharyngeal -- Pharyngeal- Puree WFL Pharyngeal -- Pharyngeal- Mechanical Soft -- Pharyngeal -- Pharyngeal- Regular Delayed swallow initiation-pyriform sinuses;Penetration/Aspiration during swallow Pharyngeal -- Pharyngeal- Multi-consistency -- Pharyngeal -- Pharyngeal- Pill -- Pharyngeal -- Pharyngeal Comment --  CHL IP CERVICAL ESOPHAGEAL PHASE 01/09/2020 Cervical Esophageal Phase WFL Pudding Teaspoon -- Pudding Cup -- Honey Teaspoon -- Honey Cup -- Nectar Teaspoon -- Nectar Cup -- Nectar Straw -- Thin Teaspoon -- Thin Cup -- Thin Straw -- Puree -- Mechanical Soft -- Regular --  Multi-consistency -- Pill -- Cervical Esophageal Comment -- [place] Siren  01/09/2020, 4:32 PM  Orbie Pyo Colvin Caroli.Ed Actor Pager [phone] Office [phone]                Discharge Exam: Vitals:   01/12/20 0604 01/12/20 1230  BP: (!) 134/55   Pulse: 62   Resp: 18   Temp: 97.6 F (36.4 C)   SpO2: 95% 94%    General: Pt is alert, awake, not in acute distress Cardiovascular: RRR, S1/S2 +, no edema Respiratory: CTA bilaterally, no wheezing, no rhonchi, no respiratory distress, no conversational dyspnea  Abdominal: Soft, NT, ND, bowel sounds + Extremities: no edema, no cyanosis Psych: Normal mood and affect, stable judgement and insight     The results of significant diagnostics from this hospitalization (including imaging, microbiology, ancillary and laboratory) are listed below for reference.     Microbiology: Recent Results (from the past 240 hour(s))  Resp Panel by RT-PCR (Flu A&B, Covid) Nasopharyngeal Swab     Status: None   Collection Time: 01/07/20 11:25 PM   Specimen: Nasopharyngeal Swab; Nasopharyngeal(NP) swabs in vial transport medium  Result Value Ref Range Status   SARS Coronavirus 2 by RT PCR NEGATIVE NEGATIVE Final    Comment: (NOTE) SARS-CoV-2 target nucleic acids are NOT DETECTED.  The SARS-CoV-2 RNA is generally detectable in upper respiratory specimens during the acute phase of infection. The lowest concentration of SARS-CoV-2 viral copies this assay can detect is 138 copies/mL. A negative result does not preclude SARS-Cov-2 infection and should not be used as the sole basis for treatment or other patient management decisions. A negative result may occur with  improper specimen collection/handling, submission of specimen other than nasopharyngeal swab, presence of viral mutation(s) within the areas targeted by this assay, and inadequate number of viral copies(<138 copies/mL). A negative result must be combined with clinical observations, patient history, and epidemiological information. The  expected result is Negative.  Fact Sheet for Patients:  EntrepreneurPulse.com.au  Fact Sheet for Healthcare Providers:  IncredibleEmployment.be  This test is no t yet approved or cleared by the [place] FDA and  has been authorized for detection and/or diagnosis of SARS-CoV-2 by FDA under an Emergency Use Authorization (EUA). This EUA will remain  in effect (meaning this test can be used) for the duration of the COVID-19 declaration under Section 564(b)(1) of the Act, 21 U.S.C.section 360bbb-3(b)(1), unless the authorization is terminated  or revoked sooner.       Influenza A by PCR NEGATIVE NEGATIVE Final   Influenza B by PCR NEGATIVE NEGATIVE Final    Comment: (NOTE) The Xpert Xpress SARS-CoV-2/FLU/RSV plus assay is intended as an aid in the diagnosis of influenza from Nasopharyngeal swab specimens and should not be used as a sole basis for treatment. Nasal washings and aspirates are unacceptable for Xpert Xpress SARS-CoV-2/FLU/RSV testing.  Fact Sheet for Patients: EntrepreneurPulse.com.au  Fact Sheet for Healthcare Providers: IncredibleEmployment.be  This test is not yet approved or cleared by the [place] FDA and has been authorized for detection and/or diagnosis of SARS-CoV-2 by FDA under an Emergency Use Authorization (EUA). This EUA will remain in effect (meaning this test can be used) for the duration of the COVID-19 declaration under Section 564(b)(1) of the Act, 21 U.S.C. section 360bbb-3(b)(1), unless the authorization is terminated or revoked.  Performed at [facility], [place] [address] [place], [place] [address]      Labs: BNP (last 3 results) Recent Labs  11/10/19 0838  BNP 08.6   Basic Metabolic Panel: Recent Labs  Lab 01/07/20 2153 01/10/20 0113  NA 139 136  K 3.8 3.8  CL 101 99  CO2 28 29  GLUCOSE 100* 109*  BUN 12 10  CREATININE 0.97  1.05  CALCIUM 9.0 8.8*   Liver Function Tests: Recent Labs  Lab 01/07/20 2153 01/10/20 0113  AST 34 29  ALT 27 23  ALKPHOS 94 89  BILITOT 0.7 0.5  PROT 6.5 5.5*  ALBUMIN 3.6 2.9*   No results for input(s): LIPASE, AMYLASE in the last 168 hours. Recent Labs  Lab 01/10/20 0113  AMMONIA 34   CBC: Recent Labs  Lab 01/07/20 2153 01/10/20 0113  WBC 6.8 7.0  NEUTROABS 4.1  --   HGB 14.4 13.9  HCT 42.8 41.3  MCV 105.2* 101.5*  PLT 245 243   Cardiac Enzymes: No results for input(s): CKTOTAL, CKMB, CKMBINDEX, TROPONINI in the last 168 hours. BNP: Invalid input(s): POCBNP CBG: Recent Labs  Lab 01/09/20 2111 01/12/20 1129  GLUCAP 133* 132*   D-Dimer No results for input(s): DDIMER in the last 72 hours. Hgb A1c No results for input(s): HGBA1C in the last 72 hours. Lipid Profile Recent Labs    01/10/20 0113  CHOL 120  HDL 26*  LDLCALC 70  TRIG 120  CHOLHDL 4.6   Thyroid function studies No results for input(s): TSH, T4TOTAL, T3FREE, THYROIDAB in the last 72 hours.  Invalid input(s): FREET3 Anemia work up No results for input(s): VITAMINB12, FOLATE, FERRITIN, TIBC, IRON, RETICCTPCT in the last 72 hours. Urinalysis    Component Value Date/Time   COLORURINE YELLOW 01/09/2020 0930   APPEARANCEUR HAZY (A) 01/09/2020 0930   LABSPEC 1.024 01/09/2020 0930   PHURINE 5.0 01/09/2020 0930   GLUCOSEU NEGATIVE 01/09/2020 0930   HGBUR MODERATE (A) 01/09/2020 0930   BILIRUBINUR NEGATIVE 01/09/2020 0930   KETONESUR NEGATIVE 01/09/2020 0930   PROTEINUR NEGATIVE 01/09/2020 0930   NITRITE NEGATIVE 01/09/2020 0930   LEUKOCYTESUR LARGE (A) 01/09/2020 0930   Sepsis Labs Invalid input(s): PROCALCITONIN,  WBC,  LACTICIDVEN Microbiology Recent Results (from the past 240 hour(s))  Resp Panel by RT-PCR (Flu A&B, Covid) Nasopharyngeal Swab     Status: None   Collection Time: 01/07/20 11:25 PM   Specimen: Nasopharyngeal Swab; Nasopharyngeal(NP) swabs in vial transport medium   Result Value Ref Range Status   SARS Coronavirus 2 by RT PCR NEGATIVE NEGATIVE Final    Comment: (NOTE) SARS-CoV-2 target nucleic acids are NOT DETECTED.  The SARS-CoV-2 RNA is generally detectable in upper respiratory specimens during the acute phase of infection. The lowest concentration of SARS-CoV-2 viral copies this assay can detect is 138 copies/mL. A negative result does not preclude SARS-Cov-2 infection and should not be used as the sole basis for treatment or other patient management decisions. A negative result may occur with  improper specimen collection/handling, submission of specimen other than nasopharyngeal swab, presence of viral mutation(s) within the areas targeted by this assay, and inadequate number of viral copies(<138 copies/mL). A negative result must be combined with clinical observations, patient history, and epidemiological information. The expected result is Negative.  Fact Sheet for Patients:  EntrepreneurPulse.com.au  Fact Sheet for Healthcare Providers:  IncredibleEmployment.be  This test is no t yet approved or cleared by the [place] FDA and  has been authorized for detection and/or diagnosis of SARS-CoV-2 by FDA under an Emergency Use Authorization (EUA). This EUA will remain  in effect (meaning this test can be used)  for the duration of the COVID-19 declaration under Section 564(b)(1) of the Act, 21 U.S.C.section 360bbb-3(b)(1), unless the authorization is terminated  or revoked sooner.       Influenza A by PCR NEGATIVE NEGATIVE Final   Influenza B by PCR NEGATIVE NEGATIVE Final    Comment: (NOTE) The Xpert Xpress SARS-CoV-2/FLU/RSV plus assay is intended as an aid in the diagnosis of influenza from Nasopharyngeal swab specimens and should not be used as a sole basis for treatment. Nasal washings and aspirates are unacceptable for Xpert Xpress SARS-CoV-2/FLU/RSV testing.  Fact Sheet for  Patients: EntrepreneurPulse.com.au  Fact Sheet for Healthcare Providers: IncredibleEmployment.be  This test is not yet approved or cleared by the [place] FDA and has been authorized for detection and/or diagnosis of SARS-CoV-2 by FDA under an Emergency Use Authorization (EUA). This EUA will remain in effect (meaning this test can be used) for the duration of the COVID-19 declaration under Section 564(b)(1) of the Act, 21 U.S.C. section 360bbb-3(b)(1), unless the authorization is terminated or revoked.  Performed at [facility], [place] [address], [place], [place] [address]      Patient was seen and examined on the day of discharge and was found to be in stable condition. Time coordinating discharge: 35 minutes including assessment and coordination of care, as well as examination of the patient.   SIGNED:  Dessa Phi, DO [facility] 01/12/2020, 1:59 PM

## 2020-01-12 NOTE — Progress Notes (Signed)
Physical Therapy Treatment Patient Details Name: Michael Ware MRN: [id number] DOB: [birth year] Today's Date: 01/12/2020    History of Present Illness Michael Ware is a 86 y.o. male with medical history significant of bradycardia, HTN, HLD, PE, DVT, history of hospital delirium due to polypharmacy. He presented with BLE weakness, confusion, and right facial droop.    PT Comments    Pt agreeable to therapy. Prior to session PT discussed d/c plan/set up with daughter. Pt's two daughters are able to provide 24 hour A at home if pt is able to stand and bear weight through feet. Pt performed stand step transfers with min A (Mod A x 1 when backing up to recliner). Pt requiring increased time to process and execute functional mobility tasks. Pt refusing SNF and wants to go home with HHPT. Pt will benefit from skilled PT to address deficits in balance, strength, coordination, endurance, gait and safety to maximize independence with functional mobility prior to discharge. Recommendation changed to home with HHPT pending daughter's participation in OT session to assess level of assist needed.     Follow Up Recommendations  Home health PT;Supervision/Assistance - 24 hour     Equipment Recommendations  None recommended by PT    Recommendations for Other Services       Precautions / Restrictions Precautions Precautions: Fall Restrictions Weight Bearing Restrictions: No    Mobility  Bed Mobility Overal bed mobility: Needs Assistance Bed Mobility: Supine to Sit     Supine to sit: Min assist;HOB elevated     General bed mobility comments: pt able to perform supine>sit with min A and increased time. therapist assisting with pad once in sitting to improve alignment  Transfers Overall transfer level: Needs assistance Equipment used: Rolling walker (2 wheeled)   Sit to Stand: Min assist         General transfer comment: performed stand step transfer from bed>bsc and bsc>recliner with  RW. Pt requiring min A throughout and mod A x 1 with lob when backing up to recliner.  Ambulation/Gait                 Stairs             Wheelchair Mobility    Modified Rankin (Stroke Patients Only)       Balance   Sitting-balance support: No upper extremity supported;Feet supported Sitting balance-Leahy Scale: Fair     Standing balance support: Bilateral upper extremity supported Standing balance-Leahy Scale: Poor                              Cognition Arousal/Alertness: Awake/alert Behavior During Therapy: WFL for tasks assessed/performed Overall Cognitive Status: Within Functional Limits for tasks assessed                                        Exercises      General Comments General comments (skin integrity, edema, etc.): increased time spent discussing d/c plans and amount of assist daugthters can provide. Pt states he uses a stair lift chair and has a lift recliner at home. Pt states he also has a transport chair and would like to go home to resume HHPT. daugther updated on level of assist. second daugther will be present during OT to assess level of assist needed      Pertinent Vitals/Pain Pain Assessment: No/denies  pain    Home Living                      Prior Function            PT Goals (current goals can now be found in the care plan section) Acute Rehab PT Goals Patient Stated Goal: To get stronger and return to recent baseline PT Goal Formulation: With patient/family Time For Goal Achievement: 01/23/20 Potential to Achieve Goals: Good Progress towards PT goals: Progressing toward goals    Frequency    Min 3X/week      PT Plan Discharge plan needs to be updated;Frequency needs to be updated    Co-evaluation              AM-PAC PT "6 Clicks" Mobility   Outcome Measure  Help needed turning from your back to your side while in a flat bed without using bedrails?: None Help needed  moving from lying on your back to sitting on the side of a flat bed without using bedrails?: A Little Help needed moving to and from a bed to a chair (including a wheelchair)?: A Lot Help needed standing up from a chair using your arms (e.g., wheelchair or bedside chair)?: A Little Help needed to walk in hospital room?: A Little Help needed climbing 3-5 steps with a railing? : A Lot 6 Click Score: 17    End of Session Equipment Utilized During Treatment: Gait belt Activity Tolerance: Patient tolerated treatment well Patient left: in chair;with call bell/phone within reach;with chair alarm set Nurse Communication: Mobility status PT Visit Diagnosis: Unsteadiness on feet (R26.81);Other abnormalities of gait and mobility (R26.89);Muscle weakness (generalized) (M62.81)     Time: 1010-1052 PT Time Calculation (min) (ACUTE ONLY): 42 min  Charges:  $Therapeutic Activity: 38-52 mins                     Lyanne Co, DPT Acute Rehabilitation Services [phone]   [place] 01/12/2020, 12:34 PM

## 2020-01-12 NOTE — Plan of Care (Signed)
  Problem: Education: Goal: Knowledge of General Education information will improve Description: Including pain rating scale, medication(s)/side effects and non-pharmacologic comfort measures Outcome: Progressing   Problem: Health Behavior/Discharge Planning: Goal: Ability to manage health-related needs will improve Outcome: Progressing   Problem: Clinical Measurements: Goal: Respiratory complications will improve Outcome: Progressing   Problem: Clinical Measurements: Goal: Cardiovascular complication will be avoided Outcome: Progressing

## 2020-01-12 NOTE — Progress Notes (Incomplete)
Pt. Discharged in stable condition via PTAR. AVS instructions given and explained.

## 2020-01-12 NOTE — Progress Notes (Signed)
Occupational Therapy Treatment Patient Details Name: Michael Ware MRN: [id number] DOB: 12-21-1931 Today's Date: 01/12/2020    History of present illness Michael Ware is a 86 y.o. male with medical history significant of bradycardia, HTN, HLD, PE, DVT, history of hospital delirium due to polypharmacy. He presented with BLE weakness, confusion, and right facial droop.   OT comments  Patient seated in recliner with daughter at side upon entry.  Patient demonstrating completing transfers with min assist, given cueing for hand placement, forward lean; poor carryover and requires cueing for each transfer but daughter able to physically assist and provide appropriate cueing.  Functional mobility in room and hallway with min guard to min assist for safety, using RW and cueing for RW mgmt (pt tends to let RW too far in front of him). Initially demonstrates loss of balance requiring mod assist with turning and stepping backwards, but with cueing patient able to pace himself and manage these movements with min assist from daughter.  Educated daughter and patient on safety, use of gait belt, techniques for transfers/mobility, ADL safety/assist/recommendations.  Encouraged use of BSC or urinal, as daughter reports RW will not fit in bathroom. Based on performance today, with 24/7 assist for safety and physical support for transfers/ADLs from daughter patient is safe to dc home with [facility] services.  Daughter reports she feels comfortable with this. Daughter has further questions about dc, HH services, transport and medications at dc--informed RN and MD so questions could be answered. Will follow acutely.    Follow Up Recommendations  Home health OT;Supervision/Assistance - 24 hour    Equipment Recommendations  None recommended by OT (reports having 3:1 commode and RW )    Recommendations for Other Services      Precautions / Restrictions Precautions Precautions: Fall Restrictions Weight Bearing  Restrictions: No       Mobility Bed Mobility Overal bed mobility: Needs Assistance Bed Mobility: Supine to Sit     Supine to sit: Min assist;HOB elevated     General bed mobility comments: OOB in recliner upon entry   Transfers Overall transfer level: Needs assistance Equipment used: Rolling walker (2 wheeled) Transfers: Sit to/from Stand Sit to Stand: Min assist         General transfer comment: sit to stand from recliner and EOB multiple times with min assist, cueing for hand placement and forward translation     Balance Overall balance assessment: Needs assistance Sitting-balance support: No upper extremity supported;Feet supported Sitting balance-Leahy Scale: Fair     Standing balance support: Bilateral upper extremity supported;During functional activity Standing balance-Leahy Scale: Poor Standing balance comment: min guard to min assist with BUE support                           ADL either performed or assessed with clinical judgement   ADL Overall ADL's : Needs assistance/impaired                       Lower Body Dressing Details (indicate cue type and reason): reviewed safety and daugther plans to assist with LB dressing so pt can have BUE support in standing  Toilet Transfer: Minimal assistance;Ambulation;RW Toilet Transfer Details (indicate cue type and reason): min assist simulated to/from recliner    Toileting - Clothing Manipulation Details (indicate cue type and reason): discussed safety with use of urinal/BSC for ease, reports walker with no fit in bathroom     Functional mobility during  ADLs: Minimal assistance;Rolling walker;Cueing for safety;Cueing for sequencing General ADL Comments: pt limited by cognition, balance, weakness      Vision       Perception     Praxis      Cognition Arousal/Alertness: Awake/alert Behavior During Therapy: WFL for tasks assessed/performed Overall Cognitive Status: Impaired/Different  from baseline Area of Impairment: Following commands;Awareness;Problem solving;Safety/judgement;Memory                     Memory: Decreased short-term memory Following Commands: Follows one step commands consistently;Follows one step commands with increased time;Follows multi-step commands inconsistently Safety/Judgement: Decreased awareness of deficits;Decreased awareness of safety Awareness: Emergent Problem Solving: Slow processing;Decreased initiation;Requires verbal cues;Difficulty sequencing General Comments: patient with decreased recall of hand placement during transfers, following simple commands but difficulty with multistep commands and problem solving         Exercises     Shoulder Instructions       General Comments increased time spent discussing d/c plans and amount of assist daugthters can provide. Pt states he uses a stair lift chair and has a lift recliner at home. Pt states he also has a transport chair and would like to go home to resume HHPT. daugther updated on level of assist. second daugther will be present during OT to assess level of assist needed    Pertinent Vitals/ Pain       Pain Assessment: Faces Faces Pain Scale: No hurt  Home Living                                          Prior Functioning/Environment              Frequency  Min 2X/week        Progress Toward Goals  OT Goals(current goals can now be found in the care plan section)  Progress towards OT goals: Progressing toward goals  Acute Rehab OT Goals Patient Stated Goal: To get stronger and return to recent baseline  Plan Discharge plan needs to be updated;Frequency remains appropriate    Co-evaluation                 AM-PAC OT "6 Clicks" Daily Activity     Outcome Measure   Help from another person eating meals?: A Little Help from another person taking care of personal grooming?: A Little Help from another person toileting, which  includes using toliet, bedpan, or urinal?: Total Help from another person bathing (including washing, rinsing, drying)?: A Lot Help from another person to put on and taking off regular upper body clothing?: A Lot Help from another person to put on and taking off regular lower body clothing?: Total 6 Click Score: 12    End of Session Equipment Utilized During Treatment: Gait belt;Rolling walker  OT Visit Diagnosis: Other abnormalities of gait and mobility (R26.89);History of falling (Z91.81);Muscle weakness (generalized) (M62.81);Pain   Activity Tolerance Patient tolerated treatment well   Patient Left with call bell/phone within reach;in chair;with chair alarm set;with family/visitor present   Nurse Communication Mobility status;Precautions;Other (comment) (follow with patient on medical questions )        Time: 1259-1345 OT Time Calculation (min): 46 min  Charges: OT General Charges $OT Visit: 1 Visit OT Treatments $Self Care/Home Management : 38-52 mins  [place] Pager [phone] Office [phone]    Delight Stare 01/12/2020,  2:32 PM

## 2020-01-12 NOTE — Plan of Care (Signed)
  Problem: Education: Goal: Knowledge of General Education information will improve Description Including pain rating scale, medication(s)/side effects and non-pharmacologic comfort measures Outcome: Progressing   

## 2020-01-12 NOTE — TOC Transition Note (Signed)
Transition of Care [facility]) - CM/SW Discharge Note   Patient Details  Name: Michael Ware MRN: [id number] Date of Birth: Jul 11, 1931  Transition of Care [facility]) CM/SW Contact:  Joanne Chars, LCSW Phone Number: 01/12/2020, 2:21 PM   Clinical Narrative:   CSW met with daughter and pt.  They have chosen to resume HH rather than SNF, [facility] already active.  No equipment needs.  Cory notified and will resume services.  Pt had a multitude of billing questions related to EMS calls to the home and EMS transport, referred to [facility] and to [place] office.      Final next level of care: [place] Barriers to Discharge: No Barriers Identified   Patient Goals and CMS Choice Patient states their goals for this hospitalization and ongoing recovery are:: Pt's daughter debating Home vs. SNF CMS Medicare.gov Compare Post Acute Care list provided to:: Patient Represenative (must comment) (Daughter Claiborne Billings) Choice offered to / list presented to : Patient, Adult Children  Discharge Placement                       Discharge Plan and Services In-house Referral: Clinical Social Work              DME Arranged: N/A         HH Arranged: PT, OT [place] Agency: [place] Date [place]: 01/12/20 Time [place]: 1308 Representative spoke with at [place]: [place] ([place]) Interventions     Readmission Risk Interventions No flowsheet data found.

## 2020-01-13 DIAGNOSIS — S50312D Abrasion of left elbow, subsequent encounter: Secondary | ICD-10-CM | POA: Diagnosis not present

## 2020-01-13 DIAGNOSIS — I119 Hypertensive heart disease without heart failure: Secondary | ICD-10-CM | POA: Diagnosis not present

## 2020-01-13 DIAGNOSIS — I2699 Other pulmonary embolism without acute cor pulmonale: Secondary | ICD-10-CM | POA: Diagnosis not present

## 2020-01-13 DIAGNOSIS — S42351D Displaced comminuted fracture of shaft of humerus, right arm, subsequent encounter for fracture with routine healing: Secondary | ICD-10-CM | POA: Diagnosis not present

## 2020-01-13 DIAGNOSIS — J441 Chronic obstructive pulmonary disease with (acute) exacerbation: Secondary | ICD-10-CM | POA: Diagnosis not present

## 2020-01-13 DIAGNOSIS — I82452 Acute embolism and thrombosis of left peroneal vein: Secondary | ICD-10-CM | POA: Diagnosis not present

## 2020-01-13 LAB — VITAMIN B1: Vitamin B1 (Thiamine): 186.7 nmol/L (ref 66.5–200.0)

## 2020-01-14 DIAGNOSIS — I2699 Other pulmonary embolism without acute cor pulmonale: Secondary | ICD-10-CM | POA: Diagnosis not present

## 2020-01-14 DIAGNOSIS — I119 Hypertensive heart disease without heart failure: Secondary | ICD-10-CM | POA: Diagnosis not present

## 2020-01-14 DIAGNOSIS — J441 Chronic obstructive pulmonary disease with (acute) exacerbation: Secondary | ICD-10-CM | POA: Diagnosis not present

## 2020-01-14 DIAGNOSIS — I82452 Acute embolism and thrombosis of left peroneal vein: Secondary | ICD-10-CM | POA: Diagnosis not present

## 2020-01-14 DIAGNOSIS — S50312D Abrasion of left elbow, subsequent encounter: Secondary | ICD-10-CM | POA: Diagnosis not present

## 2020-01-14 DIAGNOSIS — S42351D Displaced comminuted fracture of shaft of humerus, right arm, subsequent encounter for fracture with routine healing: Secondary | ICD-10-CM | POA: Diagnosis not present

## 2020-01-16 DIAGNOSIS — I119 Hypertensive heart disease without heart failure: Secondary | ICD-10-CM | POA: Diagnosis not present

## 2020-01-16 DIAGNOSIS — J441 Chronic obstructive pulmonary disease with (acute) exacerbation: Secondary | ICD-10-CM | POA: Diagnosis not present

## 2020-01-16 DIAGNOSIS — Z4789 Encounter for other orthopedic aftercare: Secondary | ICD-10-CM | POA: Diagnosis not present

## 2020-01-16 DIAGNOSIS — I2699 Other pulmonary embolism without acute cor pulmonale: Secondary | ICD-10-CM | POA: Diagnosis not present

## 2020-01-16 DIAGNOSIS — M25511 Pain in right shoulder: Secondary | ICD-10-CM | POA: Diagnosis not present

## 2020-01-16 DIAGNOSIS — S42351D Displaced comminuted fracture of shaft of humerus, right arm, subsequent encounter for fracture with routine healing: Secondary | ICD-10-CM | POA: Diagnosis not present

## 2020-01-16 DIAGNOSIS — I82452 Acute embolism and thrombosis of left peroneal vein: Secondary | ICD-10-CM | POA: Diagnosis not present

## 2020-01-16 DIAGNOSIS — S50312D Abrasion of left elbow, subsequent encounter: Secondary | ICD-10-CM | POA: Diagnosis not present

## 2020-01-19 DIAGNOSIS — I2699 Other pulmonary embolism without acute cor pulmonale: Secondary | ICD-10-CM | POA: Diagnosis not present

## 2020-01-19 DIAGNOSIS — I82452 Acute embolism and thrombosis of left peroneal vein: Secondary | ICD-10-CM | POA: Diagnosis not present

## 2020-01-19 DIAGNOSIS — S50312D Abrasion of left elbow, subsequent encounter: Secondary | ICD-10-CM | POA: Diagnosis not present

## 2020-01-19 DIAGNOSIS — I119 Hypertensive heart disease without heart failure: Secondary | ICD-10-CM | POA: Diagnosis not present

## 2020-01-19 DIAGNOSIS — S42351D Displaced comminuted fracture of shaft of humerus, right arm, subsequent encounter for fracture with routine healing: Secondary | ICD-10-CM | POA: Diagnosis not present

## 2020-01-19 DIAGNOSIS — J441 Chronic obstructive pulmonary disease with (acute) exacerbation: Secondary | ICD-10-CM | POA: Diagnosis not present

## 2020-01-20 DIAGNOSIS — J441 Chronic obstructive pulmonary disease with (acute) exacerbation: Secondary | ICD-10-CM | POA: Diagnosis not present

## 2020-01-20 DIAGNOSIS — I82452 Acute embolism and thrombosis of left peroneal vein: Secondary | ICD-10-CM | POA: Diagnosis not present

## 2020-01-20 DIAGNOSIS — S42351D Displaced comminuted fracture of shaft of humerus, right arm, subsequent encounter for fracture with routine healing: Secondary | ICD-10-CM | POA: Diagnosis not present

## 2020-01-20 DIAGNOSIS — I2699 Other pulmonary embolism without acute cor pulmonale: Secondary | ICD-10-CM | POA: Diagnosis not present

## 2020-01-20 DIAGNOSIS — I119 Hypertensive heart disease without heart failure: Secondary | ICD-10-CM | POA: Diagnosis not present

## 2020-01-20 DIAGNOSIS — S50312D Abrasion of left elbow, subsequent encounter: Secondary | ICD-10-CM | POA: Diagnosis not present

## 2020-01-23 DIAGNOSIS — I119 Hypertensive heart disease without heart failure: Secondary | ICD-10-CM | POA: Diagnosis not present

## 2020-01-23 DIAGNOSIS — S42351D Displaced comminuted fracture of shaft of humerus, right arm, subsequent encounter for fracture with routine healing: Secondary | ICD-10-CM | POA: Diagnosis not present

## 2020-01-23 DIAGNOSIS — S50312D Abrasion of left elbow, subsequent encounter: Secondary | ICD-10-CM | POA: Diagnosis not present

## 2020-01-23 DIAGNOSIS — J441 Chronic obstructive pulmonary disease with (acute) exacerbation: Secondary | ICD-10-CM | POA: Diagnosis not present

## 2020-01-23 DIAGNOSIS — I2699 Other pulmonary embolism without acute cor pulmonale: Secondary | ICD-10-CM | POA: Diagnosis not present

## 2020-01-23 DIAGNOSIS — I82452 Acute embolism and thrombosis of left peroneal vein: Secondary | ICD-10-CM | POA: Diagnosis not present

## 2020-01-26 DIAGNOSIS — S42351D Displaced comminuted fracture of shaft of humerus, right arm, subsequent encounter for fracture with routine healing: Secondary | ICD-10-CM | POA: Diagnosis not present

## 2020-01-26 DIAGNOSIS — I2699 Other pulmonary embolism without acute cor pulmonale: Secondary | ICD-10-CM | POA: Diagnosis not present

## 2020-01-26 DIAGNOSIS — I119 Hypertensive heart disease without heart failure: Secondary | ICD-10-CM | POA: Diagnosis not present

## 2020-01-26 DIAGNOSIS — I82452 Acute embolism and thrombosis of left peroneal vein: Secondary | ICD-10-CM | POA: Diagnosis not present

## 2020-01-26 DIAGNOSIS — J441 Chronic obstructive pulmonary disease with (acute) exacerbation: Secondary | ICD-10-CM | POA: Diagnosis not present

## 2020-01-26 DIAGNOSIS — S50312D Abrasion of left elbow, subsequent encounter: Secondary | ICD-10-CM | POA: Diagnosis not present

## 2020-01-27 DIAGNOSIS — J441 Chronic obstructive pulmonary disease with (acute) exacerbation: Secondary | ICD-10-CM | POA: Diagnosis not present

## 2020-01-27 DIAGNOSIS — S50312D Abrasion of left elbow, subsequent encounter: Secondary | ICD-10-CM | POA: Diagnosis not present

## 2020-01-27 DIAGNOSIS — I2699 Other pulmonary embolism without acute cor pulmonale: Secondary | ICD-10-CM | POA: Diagnosis not present

## 2020-01-27 DIAGNOSIS — I119 Hypertensive heart disease without heart failure: Secondary | ICD-10-CM | POA: Diagnosis not present

## 2020-01-27 DIAGNOSIS — S42351D Displaced comminuted fracture of shaft of humerus, right arm, subsequent encounter for fracture with routine healing: Secondary | ICD-10-CM | POA: Diagnosis not present

## 2020-01-27 DIAGNOSIS — I82452 Acute embolism and thrombosis of left peroneal vein: Secondary | ICD-10-CM | POA: Diagnosis not present

## 2020-01-28 DIAGNOSIS — I890 Lymphedema, not elsewhere classified: Secondary | ICD-10-CM | POA: Diagnosis not present

## 2020-01-28 DIAGNOSIS — G4700 Insomnia, unspecified: Secondary | ICD-10-CM | POA: Diagnosis not present

## 2020-01-28 DIAGNOSIS — R531 Weakness: Secondary | ICD-10-CM | POA: Diagnosis not present

## 2020-01-28 DIAGNOSIS — I82452 Acute embolism and thrombosis of left peroneal vein: Secondary | ICD-10-CM | POA: Diagnosis not present

## 2020-01-28 DIAGNOSIS — I2699 Other pulmonary embolism without acute cor pulmonale: Secondary | ICD-10-CM | POA: Diagnosis not present

## 2020-01-28 DIAGNOSIS — F4321 Adjustment disorder with depressed mood: Secondary | ICD-10-CM | POA: Diagnosis not present

## 2020-01-28 DIAGNOSIS — J441 Chronic obstructive pulmonary disease with (acute) exacerbation: Secondary | ICD-10-CM | POA: Diagnosis not present

## 2020-01-28 DIAGNOSIS — I119 Hypertensive heart disease without heart failure: Secondary | ICD-10-CM | POA: Diagnosis not present

## 2020-01-28 DIAGNOSIS — S50312D Abrasion of left elbow, subsequent encounter: Secondary | ICD-10-CM | POA: Diagnosis not present

## 2020-01-28 DIAGNOSIS — S42351D Displaced comminuted fracture of shaft of humerus, right arm, subsequent encounter for fracture with routine healing: Secondary | ICD-10-CM | POA: Diagnosis not present

## 2020-01-29 DIAGNOSIS — I2699 Other pulmonary embolism without acute cor pulmonale: Secondary | ICD-10-CM | POA: Diagnosis not present

## 2020-01-29 DIAGNOSIS — S50312D Abrasion of left elbow, subsequent encounter: Secondary | ICD-10-CM | POA: Diagnosis not present

## 2020-01-29 DIAGNOSIS — S42351D Displaced comminuted fracture of shaft of humerus, right arm, subsequent encounter for fracture with routine healing: Secondary | ICD-10-CM | POA: Diagnosis not present

## 2020-01-29 DIAGNOSIS — I119 Hypertensive heart disease without heart failure: Secondary | ICD-10-CM | POA: Diagnosis not present

## 2020-01-29 DIAGNOSIS — I82452 Acute embolism and thrombosis of left peroneal vein: Secondary | ICD-10-CM | POA: Diagnosis not present

## 2020-01-29 DIAGNOSIS — J441 Chronic obstructive pulmonary disease with (acute) exacerbation: Secondary | ICD-10-CM | POA: Diagnosis not present

## 2020-02-01 DIAGNOSIS — M4186 Other forms of scoliosis, lumbar region: Secondary | ICD-10-CM | POA: Diagnosis not present

## 2020-02-01 DIAGNOSIS — M2578 Osteophyte, vertebrae: Secondary | ICD-10-CM | POA: Diagnosis not present

## 2020-02-01 DIAGNOSIS — I2699 Other pulmonary embolism without acute cor pulmonale: Secondary | ICD-10-CM | POA: Diagnosis not present

## 2020-02-01 DIAGNOSIS — M4726 Other spondylosis with radiculopathy, lumbar region: Secondary | ICD-10-CM | POA: Diagnosis not present

## 2020-02-01 DIAGNOSIS — M48061 Spinal stenosis, lumbar region without neurogenic claudication: Secondary | ICD-10-CM | POA: Diagnosis not present

## 2020-02-01 DIAGNOSIS — R1312 Dysphagia, oropharyngeal phase: Secondary | ICD-10-CM | POA: Diagnosis not present

## 2020-02-01 DIAGNOSIS — M47814 Spondylosis without myelopathy or radiculopathy, thoracic region: Secondary | ICD-10-CM | POA: Diagnosis not present

## 2020-02-01 DIAGNOSIS — M50322 Other cervical disc degeneration at C5-C6 level: Secondary | ICD-10-CM | POA: Diagnosis not present

## 2020-02-01 DIAGNOSIS — M4317 Spondylolisthesis, lumbosacral region: Secondary | ICD-10-CM | POA: Diagnosis not present

## 2020-02-01 DIAGNOSIS — M4804 Spinal stenosis, thoracic region: Secondary | ICD-10-CM | POA: Diagnosis not present

## 2020-02-01 DIAGNOSIS — S42351D Displaced comminuted fracture of shaft of humerus, right arm, subsequent encounter for fracture with routine healing: Secondary | ICD-10-CM | POA: Diagnosis not present

## 2020-02-01 DIAGNOSIS — J189 Pneumonia, unspecified organism: Secondary | ICD-10-CM | POA: Diagnosis not present

## 2020-02-01 DIAGNOSIS — M50223 Other cervical disc displacement at C6-C7 level: Secondary | ICD-10-CM | POA: Diagnosis not present

## 2020-02-01 DIAGNOSIS — F419 Anxiety disorder, unspecified: Secondary | ICD-10-CM | POA: Diagnosis not present

## 2020-02-01 DIAGNOSIS — N401 Enlarged prostate with lower urinary tract symptoms: Secondary | ICD-10-CM | POA: Diagnosis not present

## 2020-02-01 DIAGNOSIS — M4802 Spinal stenosis, cervical region: Secondary | ICD-10-CM | POA: Diagnosis not present

## 2020-02-01 DIAGNOSIS — J439 Emphysema, unspecified: Secondary | ICD-10-CM | POA: Diagnosis not present

## 2020-02-01 DIAGNOSIS — I119 Hypertensive heart disease without heart failure: Secondary | ICD-10-CM | POA: Diagnosis not present

## 2020-02-01 DIAGNOSIS — G9341 Metabolic encephalopathy: Secondary | ICD-10-CM | POA: Diagnosis not present

## 2020-02-01 DIAGNOSIS — I82452 Acute embolism and thrombosis of left peroneal vein: Secondary | ICD-10-CM | POA: Diagnosis not present

## 2020-02-01 DIAGNOSIS — L89610 Pressure ulcer of right heel, unstageable: Secondary | ICD-10-CM | POA: Diagnosis not present

## 2020-02-01 DIAGNOSIS — M47812 Spondylosis without myelopathy or radiculopathy, cervical region: Secondary | ICD-10-CM | POA: Diagnosis not present

## 2020-02-01 DIAGNOSIS — M170 Bilateral primary osteoarthritis of knee: Secondary | ICD-10-CM | POA: Diagnosis not present

## 2020-02-01 DIAGNOSIS — J9601 Acute respiratory failure with hypoxia: Secondary | ICD-10-CM | POA: Diagnosis not present

## 2020-02-01 DIAGNOSIS — M7072 Other bursitis of hip, left hip: Secondary | ICD-10-CM | POA: Diagnosis not present

## 2020-02-04 DIAGNOSIS — L89610 Pressure ulcer of right heel, unstageable: Secondary | ICD-10-CM | POA: Diagnosis not present

## 2020-02-04 DIAGNOSIS — G9341 Metabolic encephalopathy: Secondary | ICD-10-CM | POA: Diagnosis not present

## 2020-02-04 DIAGNOSIS — S42351D Displaced comminuted fracture of shaft of humerus, right arm, subsequent encounter for fracture with routine healing: Secondary | ICD-10-CM | POA: Diagnosis not present

## 2020-02-04 DIAGNOSIS — J189 Pneumonia, unspecified organism: Secondary | ICD-10-CM | POA: Diagnosis not present

## 2020-02-04 DIAGNOSIS — J439 Emphysema, unspecified: Secondary | ICD-10-CM | POA: Diagnosis not present

## 2020-02-04 DIAGNOSIS — J9601 Acute respiratory failure with hypoxia: Secondary | ICD-10-CM | POA: Diagnosis not present

## 2020-02-05 DIAGNOSIS — J439 Emphysema, unspecified: Secondary | ICD-10-CM | POA: Diagnosis not present

## 2020-02-05 DIAGNOSIS — L89610 Pressure ulcer of right heel, unstageable: Secondary | ICD-10-CM | POA: Diagnosis not present

## 2020-02-05 DIAGNOSIS — J189 Pneumonia, unspecified organism: Secondary | ICD-10-CM | POA: Diagnosis not present

## 2020-02-05 DIAGNOSIS — J9601 Acute respiratory failure with hypoxia: Secondary | ICD-10-CM | POA: Diagnosis not present

## 2020-02-05 DIAGNOSIS — S42351D Displaced comminuted fracture of shaft of humerus, right arm, subsequent encounter for fracture with routine healing: Secondary | ICD-10-CM | POA: Diagnosis not present

## 2020-02-05 DIAGNOSIS — G9341 Metabolic encephalopathy: Secondary | ICD-10-CM | POA: Diagnosis not present

## 2020-02-10 DIAGNOSIS — S42351D Displaced comminuted fracture of shaft of humerus, right arm, subsequent encounter for fracture with routine healing: Secondary | ICD-10-CM | POA: Diagnosis not present

## 2020-02-10 DIAGNOSIS — L89610 Pressure ulcer of right heel, unstageable: Secondary | ICD-10-CM | POA: Diagnosis not present

## 2020-02-10 DIAGNOSIS — J9601 Acute respiratory failure with hypoxia: Secondary | ICD-10-CM | POA: Diagnosis not present

## 2020-02-10 DIAGNOSIS — J189 Pneumonia, unspecified organism: Secondary | ICD-10-CM | POA: Diagnosis not present

## 2020-02-10 DIAGNOSIS — G9341 Metabolic encephalopathy: Secondary | ICD-10-CM | POA: Diagnosis not present

## 2020-02-10 DIAGNOSIS — J439 Emphysema, unspecified: Secondary | ICD-10-CM | POA: Diagnosis not present

## 2020-02-11 ENCOUNTER — Encounter (HOSPITAL_BASED_OUTPATIENT_CLINIC_OR_DEPARTMENT_OTHER): Payer: Medicare Other | Admitting: Physician Assistant

## 2020-02-11 DIAGNOSIS — Z4789 Encounter for other orthopedic aftercare: Secondary | ICD-10-CM | POA: Diagnosis not present

## 2020-02-13 DIAGNOSIS — S42351D Displaced comminuted fracture of shaft of humerus, right arm, subsequent encounter for fracture with routine healing: Secondary | ICD-10-CM | POA: Diagnosis not present

## 2020-02-13 DIAGNOSIS — L89610 Pressure ulcer of right heel, unstageable: Secondary | ICD-10-CM | POA: Diagnosis not present

## 2020-02-13 DIAGNOSIS — J439 Emphysema, unspecified: Secondary | ICD-10-CM | POA: Diagnosis not present

## 2020-02-13 DIAGNOSIS — J189 Pneumonia, unspecified organism: Secondary | ICD-10-CM | POA: Diagnosis not present

## 2020-02-13 DIAGNOSIS — J9601 Acute respiratory failure with hypoxia: Secondary | ICD-10-CM | POA: Diagnosis not present

## 2020-02-13 DIAGNOSIS — G9341 Metabolic encephalopathy: Secondary | ICD-10-CM | POA: Diagnosis not present

## 2020-03-02 DIAGNOSIS — E785 Hyperlipidemia, unspecified: Secondary | ICD-10-CM | POA: Diagnosis not present

## 2020-03-02 DIAGNOSIS — L8990 Pressure ulcer of unspecified site, unspecified stage: Secondary | ICD-10-CM | POA: Diagnosis not present

## 2020-03-02 DIAGNOSIS — F332 Major depressive disorder, recurrent severe without psychotic features: Secondary | ICD-10-CM | POA: Diagnosis not present

## 2020-03-02 DIAGNOSIS — Z86711 Personal history of pulmonary embolism: Secondary | ICD-10-CM | POA: Diagnosis not present

## 2020-04-06 ENCOUNTER — Encounter (INDEPENDENT_AMBULATORY_CARE_PROVIDER_SITE_OTHER): Payer: Self-pay | Admitting: Physical Medicine & Rehabilitation

## 2020-04-06 ENCOUNTER — Ambulatory Visit (INDEPENDENT_AMBULATORY_CARE_PROVIDER_SITE_OTHER): Payer: Medicare Other | Admitting: Physical Medicine & Rehabilitation

## 2020-04-06 VITALS — BP 137/52 | HR 84 | Ht 65.0 in | Wt 195.0 lb

## 2020-04-06 DIAGNOSIS — M47817 Spondylosis without myelopathy or radiculopathy, lumbosacral region: Secondary | ICD-10-CM

## 2020-04-06 DIAGNOSIS — M533 Sacrococcygeal disorders, not elsewhere classified: Secondary | ICD-10-CM

## 2020-04-06 DIAGNOSIS — M5137 Other intervertebral disc degeneration, lumbosacral region: Secondary | ICD-10-CM

## 2020-04-06 NOTE — Progress Notes (Addendum)
New Patient Evaluation      Patient Name: [name] [name]    DOB: May 23, 1931    MRN: [id number]    Referring Physician:   Self Referral      History of Present Illness:    As you know, [name] [name]  is a pleasant 87 y.o., male with the chief complaint of axial low back pain located in the midline and bilateral paramidline lumbosacral region. Denies lower limb pain.  The pain is 4 out of 10 in severity, and aching, dull in quality.  Onset gradual and spontaneous , 12 years duration.  Exacerbating factors include standing and walking.  Alleviating factors include sitting . Associated symptoms include: are denied by patient.    Patient endorses ""years" of pain/spine management historically. Management has include:   - 2x SCS trial: Nevro and Abbott, negative trials.   - LS epidural steroid injections, no sustained relief.   - LS MBBs/RFAs, denies improvement.   - patient has been referred to NSG/orthopedics for surgical evaluation. Per report, ~ 5 surgeons. Patient endorses that he was not a surgical candidate.   - Patient has trial: gabapentin, topamax, oxycontin, APAP and muscle relaxers for PO analgesia. Denies significant benefit with these and other similar medications.     Patient presents today to determine if anything else can be done for his index pain.     Additional historical information:   - patient with traumatic right humeral fracture (11/2019) s/p repair. 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 Currently on Xarelto.          Prior Treatments: Acupuncture (Previous), Physical therapy (Previous), Heat/ Ice therapy (Previous)    Prior Medications: Gabapentin (Previous), IBU (Previous), Icy hot (Previous), Lidocaine patches (Previous)    Current Medications:    Current Outpatient Medications:     B Complex-C (B-complex with vitamin C) tablet, Take 1 tablet by mouth daily, Disp: , Rfl:     Cyanocobalamin 1000 MCG Tab CR, Take 1 tablet by mouth daily, Disp: ,  Rfl:     diazePAM (VALIUM) 10 MG tablet, Take 10 mg by mouth, Disp: , Rfl:     FLUoxetine (PROzac) 10 MG capsule, Take 10 mg by mouth daily, Disp: , Rfl:     MELATONIN PO, Take 1 tablet by mouth, Disp: , Rfl:     mirtazapine (REMERON) 30 MG tablet, Take 1 tablet by mouth nightly as needed, Disp: , Rfl:     pravastatin (PRAVACHOL) 40 MG tablet, Take 40 mg by mouth daily, Disp: , Rfl:     rivaroxaban (XARELTO) 20 MG Tab, Take 20 mg by mouth daily, Disp: , Rfl:     tiZANidine (ZANAFLEX) 4 MG tablet, Take 4 mg by mouth, Disp: , Rfl:     vitamin D3 (CHOLECALCIFEROL) 125 MCG (5000 UT) capsule, Take 5,000 Units by mouth daily, Disp: , Rfl:     Zinc Methionate 50 MG Cap, Take 50 mg by mouth daily, Disp: , Rfl:     Allergies:  Allergies   Allergen Reactions    Morphine And Related Other (See Comments)     hallucinations       Past Medical History:  History reviewed. No pertinent past medical history.    Past Surgical History:  History reviewed. No pertinent surgical history.    Family History:  History reviewed. No pertinent family history.    Social History:  Social History     Socioeconomic History    Marital status: Widowed   Tobacco Use    Smoking status:  Never Smoker   Substance and Sexual Activity    Alcohol use: Never     Review Of Systems:    Constitutional:  Negative   Neurological:  Negative   Musculoskeletal:  Joint Swelling   HEENT:  Negative   Cardiovascular:  negative  Respiratory:  Negative   Gastrointestinal:  Negative   Genitourinary:  Negative   Skin:   Negative   Psychiatric: Negative   Male:  Negative     Physical Examination:    Vitals:    04/06/20 1341   BP: 137/52   Pulse: 84   SpO2: 99%     Constitutional: The patient is oriented to person, place and time with normal mood and affect. Ambulation nonantalgic, walks with forward slumped posture.  Skin: unremarkable  Lymph nodes: Inspection for gross deformities is negative  Girth: Upper and lower limbs are symmetric  Palpation: There is no  pain on palpation of the lumbosacral spine. NO pain with closed fist percussion to the thoracolumbar and lumbosacral spine.   Joints:  Functional range of motion in the bilateral upper and lower limbs, neck, and trunk with no evidence of instability or effusion.   Spine: Sustained Hip Flexion and SLR deferred as patient was unable to lay supine due to reproduction of his index pain. Bilateral SIJ orthopedic examination deferred as patient was unable to lay supine due to reproduction of index pain.   Lumbar range of motion mildly restricted. Exaggerated thoracic kyphosis.   Neurologic:  Manual muscle testing 5/5 in the bilateral lower limbs.  Muscle stretch reflexes 2+ in the bilateral  lower limbs except: bilateral achilles 1+  Sensation intact to light touch in the bilateral lower limbs.  Upper motor neuron signs negative in the bilateral lower limbs.  Toes are down-going bilaterally, no clonus at the ankle bilaterally    Lab Review:  No results found for: BUN, CREAT, BUNCRRATIO, ALT, AST      Radiology Review:  No imaging on file to review.    EMG/NCV Review:   No EMG on file to review.    Outside Record Review:   Moderate review of outside medical records.     ODI: 18%    Impression:  1. 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   2. H/o post-operative PE (11/2019) - on Xarelto (managed by Dr. Lynnda Shields).     Plan:  - The patient will undergo AP, lateral and flexion-extension lumbosacral xrays to evaluate intervertebral disc height, alignment and to assess for instability.  - The patient will undergo lumbosacral MRI to assess for morphologic abnormalities including HIZs as well as type I and type II Modic endplate changes.  -  The patient was prescribed spine focused physical therapy to improve biomechanics and reduce strain on the spine during recreational and occupational activities. [Patient requesting  to hold off on PT until imaging is compete.   - RTC after imaging complete WITH ME.        Landry Mellow, DO   [facility] Interventional Spine    Thank you for allowing me to be involved in the care of this patient.  If you have any questions please contact me at [facility] Interventional Spine at [phone]    CC:  Self Referral, Dr. Lynnda Shields.

## 2020-04-29 DIAGNOSIS — S20211A Contusion of right front wall of thorax, initial encounter: Secondary | ICD-10-CM | POA: Diagnosis not present

## 2020-04-29 DIAGNOSIS — M5416 Radiculopathy, lumbar region: Secondary | ICD-10-CM | POA: Diagnosis not present

## 2020-05-21 ENCOUNTER — Ambulatory Visit (INDEPENDENT_AMBULATORY_CARE_PROVIDER_SITE_OTHER): Payer: Medicare Other | Admitting: Physical Medicine & Rehabilitation

## 2020-06-22 DIAGNOSIS — E785 Hyperlipidemia, unspecified: Secondary | ICD-10-CM | POA: Diagnosis not present

## 2020-06-22 DIAGNOSIS — L8990 Pressure ulcer of unspecified site, unspecified stage: Secondary | ICD-10-CM | POA: Diagnosis not present

## 2020-06-22 DIAGNOSIS — R2241 Localized swelling, mass and lump, right lower limb: Secondary | ICD-10-CM | POA: Diagnosis not present

## 2020-06-22 DIAGNOSIS — G63 Polyneuropathy in diseases classified elsewhere: Secondary | ICD-10-CM | POA: Diagnosis not present

## 2020-07-01 ENCOUNTER — Telehealth (INDEPENDENT_AMBULATORY_CARE_PROVIDER_SITE_OTHER): Payer: Self-pay | Admitting: Orthopaedic Surgery

## 2020-07-01 NOTE — Telephone Encounter (Signed)
LM on VM of pt's daughter, Satira Anis [phone]) to call back. OK to schedule first available for f/up R humerus fx, per provider who reviewed xrays if pt isn't having any issues he may f/up prn as pt is 10 months post op.

## 2020-07-01 NOTE — Telephone Encounter (Signed)
Daughter called back to schedule appointment.   She states that [name] [name] is set on seeing an ortho provider about his arm  Had surgery back in [facility] September 2021  March 2022 had a GLF after tripping over fireplace grate that was in walkway of living room. But did not have issue/concern over arm at that time.  Right arm started to bother him about 6 weeks ago and feels that there might be something wrong with the hardware. [facility] to accept patient (according to daughter) and deferred to our clinic.   Patient is scheduled for 6/15 at 1300 at this time

## 2020-07-08 ENCOUNTER — Ambulatory Visit
Admission: RE | Admit: 2020-07-08 | Discharge: 2020-07-08 | Disposition: A | Discharge status: Home / Self Care | Payer: Self-pay | Source: Ambulatory Visit | Attending: Orthopaedic Surgery | Admitting: Orthopaedic Surgery

## 2020-07-08 ENCOUNTER — Encounter: Payer: Self-pay | Admitting: Orthopaedic Surgery

## 2020-07-21 ENCOUNTER — Ambulatory Visit (INDEPENDENT_AMBULATORY_CARE_PROVIDER_SITE_OTHER): Payer: Medicare Other

## 2020-08-10 ENCOUNTER — Other Ambulatory Visit (INDEPENDENT_AMBULATORY_CARE_PROVIDER_SITE_OTHER): Payer: Self-pay | Admitting: Medical

## 2020-08-10 DIAGNOSIS — M79601 Pain in right arm: Secondary | ICD-10-CM

## 2020-08-11 ENCOUNTER — Ambulatory Visit
Admission: RE | Admit: 2020-08-11 | Discharge: 2020-08-11 | Disposition: A | Discharge status: Home / Self Care | Payer: Medicare Other | Source: Ambulatory Visit | Attending: Medical | Admitting: Medical

## 2020-08-11 ENCOUNTER — Encounter (INDEPENDENT_AMBULATORY_CARE_PROVIDER_SITE_OTHER): Payer: Self-pay | Admitting: Medical

## 2020-08-11 ENCOUNTER — Ambulatory Visit (INDEPENDENT_AMBULATORY_CARE_PROVIDER_SITE_OTHER): Payer: Medicare Other | Admitting: Medical

## 2020-08-11 DIAGNOSIS — S42301D Unspecified fracture of shaft of humerus, right arm, subsequent encounter for fracture with routine healing: Secondary | ICD-10-CM

## 2020-08-11 DIAGNOSIS — S42301S Unspecified fracture of shaft of humerus, right arm, sequela: Secondary | ICD-10-CM

## 2020-08-11 DIAGNOSIS — Z967 Presence of other bone and tendon implants: Secondary | ICD-10-CM

## 2020-08-11 DIAGNOSIS — M79601 Pain in right arm: Secondary | ICD-10-CM | POA: Insufficient documentation

## 2020-08-11 NOTE — Progress Notes (Signed)
[facility] Orthopaedic Trauma  Office Note    ASSESSMENT/PLAN   87 y.o. male with:    Closed right humerus fracture   S/p right humerus ORIF in [place]    9 months since DOS      WB status: WBAT  PT/OT script provided: NO  Work/SNF/school note provided: NO  Follow-up: prn for re-evaluation  Anticipated imaging: None    - Patient has been advised to follow-up sooner for persistent and/or worsening symptoms      CHIEF COMPLAINT     Chief Complaint   Patient presents with    Shoulder Injury     DOI: 11/04/19 DOS: 11/07/19 S/P ORIF R HUMERUS ([place], [place]) HX: FALL       HPI   87 y.o. male presents for:    Closed right humerus fracture   S/p right humerus ORIF in [place]    9 months since DOS    Patient reports that he is doing ok. Reports some achy pain in mid humerus/shoulder region. He did fall in March which concerned him that he injured hardware.        Denies numbness or tingling of the distal extremity. No further complaints or concerns at this time.  PHYSICAL EXAM     Appearance:  Well developed, well nourished 87 y.o. male patient.     Psychiatric:   Mood and affect appropriate.   Eyes: Normal sclera   HEENT:  Normocephalic, atraumatic head. Trachea midline.    Respiratory:  Normal respiratory effort without distress.    Cardiovascular: Extremity warm and well perfused. Brisk cap refill.    Integumentary: No open wounds noted. No visible rash noted.    Neurologic:  Sensation to light touch intact in distal extremity.    Musculoskeletal:  Right upper extremity - Shoulder flex to about 110 degrees. Internal rotation to sacrum. NVI distally.      RADIOLOGY     Plain films of the right humerus 2V were reviewed and interpreted by me.     Date Imaged:  08/11/2020      Date Reviewed:  08/11/2020     Location:  [facility]     Indication:  injury, pain       Interpretation: Hardware is present and appears to be in good position without evidence of loosening. Fx appears healed. Bony alignment appears stable compared to prior  imaging.    ---    No results found.    DEMOGRAPHICS     Date and Time: 08/11/2020 4:13 PM  Patient Name: [name] [name], [name] [name]  DOB: 1932-01-25  Age: 87 y.o.   PCP: Ellender Hose, MD    History provided by patient and daughter.     History limited by none.     REVIEW OF SYSTEMS/MEDICAL RECORD REVIEW     ROS as noted in HPI    ---    PAST MEDICAL AND SURGICAL HISTORY  History reviewed. No pertinent past medical history.  History reviewed. No pertinent surgical history.    SOCIAL AND FAMILY HISTORY  Social History     Tobacco Use    Smoking status: Never   Substance Use Topics    Alcohol use: Never     History reviewed. No pertinent family history.    ALLERGIES    Allergies   Allergen Reactions    Morphine And Related Other (See Comments)     hallucinations       MEDICATIONS  Prior to Admission medications    Medication Sig Start  Date End Date Taking? Authorizing Provider   B Complex-C (B-complex with vitamin C) tablet Take 1 tablet by mouth daily    [provider]   Cyanocobalamin 1000 MCG Tab CR Take 1 tablet by mouth daily    [provider]   diazePAM (VALIUM) 10 MG tablet Take 10 mg by mouth 11/26/19   [provider]   FLUoxetine (PROzac) 10 MG capsule Take 10 mg by mouth daily 12/24/18   [provider]   MELATONIN PO Take 1 tablet by mouth    [provider]   mirtazapine (REMERON) 30 MG tablet Take 1 tablet by mouth nightly as needed    [provider]   pravastatin (PRAVACHOL) 40 MG tablet Take 40 mg by mouth daily    [provider]   rivaroxaban (XARELTO) 20 MG Tab Take 20 mg by mouth daily    [provider]   tiZANidine (ZANAFLEX) 4 MG tablet Take 4 mg by mouth 11/20/18   [provider]   vitamin D3 (CHOLECALCIFEROL) 125 MCG (5000 UT) capsule Take 5,000 Units by mouth daily    [provider]   Zinc Methionate 50 MG Cap Take 50 mg by mouth daily    [provider]       MDM/TIME SPENT     The time spent on  the day of service in review of prior records and tests, face to face time with the patient, documenting, placing orders, independently analyzing results, conferring with other clinicians, care coordination, and communicating results as outlined above was 31 minutes.        Outside provider documentation and imaging was reviewed for today's encounter.   ---    This chart was generated with the use of an electronic medical record and may contain errors not intended by the author.    ---  Myrtie Neither, PA  [facility] Orthopaedic Trauma

## 2020-08-31 ENCOUNTER — Encounter (INDEPENDENT_AMBULATORY_CARE_PROVIDER_SITE_OTHER): Payer: Self-pay

## 2020-09-02 ENCOUNTER — Ambulatory Visit
Admission: RE | Admit: 2020-09-02 | Discharge: 2020-09-02 | Disposition: A | Discharge status: Home / Self Care | Payer: Medicare Other | Source: Ambulatory Visit | Attending: Physical Medicine & Rehabilitation | Admitting: Physical Medicine & Rehabilitation

## 2020-09-02 DIAGNOSIS — M5137 Other intervertebral disc degeneration, lumbosacral region: Secondary | ICD-10-CM

## 2020-09-02 DIAGNOSIS — M4186 Other forms of scoliosis, lumbar region: Secondary | ICD-10-CM | POA: Insufficient documentation

## 2020-09-02 DIAGNOSIS — M48061 Spinal stenosis, lumbar region without neurogenic claudication: Secondary | ICD-10-CM | POA: Insufficient documentation

## 2020-09-02 DIAGNOSIS — M47817 Spondylosis without myelopathy or radiculopathy, lumbosacral region: Secondary | ICD-10-CM

## 2020-09-02 DIAGNOSIS — G8929 Other chronic pain: Secondary | ICD-10-CM | POA: Insufficient documentation

## 2020-09-02 DIAGNOSIS — M533 Sacrococcygeal disorders, not elsewhere classified: Secondary | ICD-10-CM

## 2020-09-02 DIAGNOSIS — M5136 Other intervertebral disc degeneration, lumbar region: Secondary | ICD-10-CM | POA: Insufficient documentation

## 2020-09-02 DIAGNOSIS — M47816 Spondylosis without myelopathy or radiculopathy, lumbar region: Secondary | ICD-10-CM | POA: Insufficient documentation

## 2020-09-07 ENCOUNTER — Encounter (INDEPENDENT_AMBULATORY_CARE_PROVIDER_SITE_OTHER): Payer: Self-pay | Admitting: Medical

## 2020-09-07 ENCOUNTER — Encounter (INDEPENDENT_AMBULATORY_CARE_PROVIDER_SITE_OTHER): Payer: Medicare Other | Admitting: Medical

## 2020-09-10 ENCOUNTER — Encounter (INDEPENDENT_AMBULATORY_CARE_PROVIDER_SITE_OTHER): Payer: Self-pay | Admitting: Physical Medicine & Rehabilitation

## 2020-09-10 ENCOUNTER — Ambulatory Visit (INDEPENDENT_AMBULATORY_CARE_PROVIDER_SITE_OTHER): Payer: Medicare Other | Admitting: Physical Medicine & Rehabilitation

## 2020-09-10 ENCOUNTER — Encounter (INDEPENDENT_AMBULATORY_CARE_PROVIDER_SITE_OTHER): Payer: Self-pay

## 2020-09-10 VITALS — BP 147/59 | HR 64 | Wt 210.0 lb

## 2020-09-10 DIAGNOSIS — M47817 Spondylosis without myelopathy or radiculopathy, lumbosacral region: Secondary | ICD-10-CM

## 2020-09-10 DIAGNOSIS — M5137 Other intervertebral disc degeneration, lumbosacral region: Secondary | ICD-10-CM

## 2020-09-10 NOTE — Progress Notes (Signed)
Follow-up Evaluation      Patient Name: [name] [name], [name] [name]    DOB: 03/15/1931    MRN: [id number]    Referring Physician:   Self Referral      History of Present Illness:    As you know, [name] [name]  is a pleasant 87 y.o., male with the chief complaint of axial low back pain located in the midline and bilateral paramidline lumbosacral region. Denies lower limb pain.  The pain is 4 out of 10 in severity, and aching, dull in quality.  Onset gradual and spontaneous , 12 years duration.  Exacerbating factors include standing and walking.  Alleviating factors include sitting . Associated symptoms include: are denied by patient.    Patient endorses "years" of pain/spine management historically. Management has include:   - 2x SCS trial: Nevro and Abbott, negative trials.   - LS epidural steroid injections, no sustained relief.   - LS MBBs/RFAs, denies improvement.   - patient has been referred to NSG/orthopedics for surgical evaluation. Per report, ~ 5 surgeons. Patient endorses that he was not a surgical candidate.   - Patient has trial: gabapentin, topamax, oxycontin, APAP and muscle relaxers for PO analgesia. Denies significant benefit with these and other similar medications.     Patient presents today to determine if anything else can be done for his index pain.     Additional historical information:   - patient with traumatic right humeral fracture (11/2019) s/p repair. 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 Currently on Xarelto.            Interval history:   - currently OFF xarelto.  - continued index axial lower back pain  - s/p recent ESI (05/30/20) with Dr. Ethelene Hal at outside facility with ~ 50% improvement of his index pain.         Prior Treatments: Acupuncture (Previous), Physical therapy (Previous), Heat/ Ice therapy (Previous)    Prior Medications: Gabapentin (Previous), IBU (Previous), Icy hot (Previous), Lidocaine patches (Previous)    Current Medications:    Current  Outpatient Medications:     B Complex-C (B-complex with vitamin C) tablet, Take 1 tablet by mouth daily, Disp: , Rfl:     Cyanocobalamin 1000 MCG Tab CR, Take 1 tablet by mouth daily, Disp: , Rfl:     diazePAM (VALIUM) 10 MG tablet, Take 10 mg by mouth, Disp: , Rfl:     FLUoxetine (PROzac) 10 MG capsule, Take 10 mg by mouth daily, Disp: , Rfl:     MELATONIN PO, Take 1 tablet by mouth, Disp: , Rfl:     mirtazapine (REMERON) 30 MG tablet, Take 1 tablet by mouth nightly as needed, Disp: , Rfl:     pravastatin (PRAVACHOL) 40 MG tablet, Take 40 mg by mouth daily, Disp: , Rfl:     tiZANidine (ZANAFLEX) 4 MG tablet, Take 4 mg by mouth, Disp: , Rfl:     vitamin D3 (CHOLECALCIFEROL) 125 MCG (5000 UT) capsule, Take 5,000 Units by mouth daily, Disp: , Rfl:     Zinc Methionate 50 MG Cap, Take 50 mg by mouth daily, Disp: , Rfl:     Allergies:  Allergies   Allergen Reactions    Morphine And Related Other (See Comments)     hallucinations       Past Medical History:  History reviewed. No pertinent past medical history.    Past Surgical History:  Past Surgical History:   Procedure Laterality Date    HUMERUS FRACTURE SURGERY Right 11/05/2019  Family History:  History reviewed. No pertinent family history.    Social History:  Social History     Socioeconomic History    Marital status: Widowed   Tobacco Use    Smoking status: Never    Smokeless tobacco: Former   Substance and Sexual Activity    Alcohol use: Never     Review Of Systems:    Constitutional:  Negative   Neurological:  Negative   Musculoskeletal:  Joint Swelling   HEENT:  Negative   Cardiovascular:  negative  Respiratory:  Negative   Gastrointestinal:  Negative   Genitourinary:  Negative   Skin:   Negative   Psychiatric: Negative   Male:  Negative     Physical Examination:    Vitals:    09/10/20 1251   BP: 147/59   Pulse: 64   SpO2: 93%     Constitutional: The patient is oriented to person, place and time with normal mood and affect. Ambulation nonantalgic, walks with  forward slumped posture. Uses manual WC for mobility.   Skin: unremarkable  Lymph nodes: Inspection for gross deformities is negative  Girth: Upper and lower limbs are symmetric  Joints:  Functional range of motion in the bilateral upper and lower limbs, neck, and trunk with no evidence of instability or effusion.   Spine: Sustained Hip Flexion and SLR deferred as patient was unable to lay supine due to reproduction of his index pain. Bilateral SIJ orthopedic examination deferred as patient was unable to lay supine due to reproduction of index pain.   Lumbar range of motion mildly restricted. Exaggerated thoracic kyphosis.   Neurologic:  Manual muscle testing 5/5 in the bilateral lower limbs.  Muscle stretch reflexes 2+ in the bilateral  lower limbs except: bilateral achilles 1+  Sensation intact to light touch in the bilateral lower limbs.  Upper motor neuron signs negative in the bilateral lower limbs.  Toes are down-going bilaterally, no clonus at the ankle bilaterally    Lab Review:  No results found for: BUN, CREAT, BUNCRRATIO, ALT, AST      Radiology Review:  MRI of the LS spine dated: 09/03/20  T12-L1:  Disc: No herniation or bulge.  Canal stenosis: None.  Left foraminal stenosis: None.  Right foraminal stenosis: None.     L1-2:   Disc: Trace broad-based disc bulge.  Canal stenosis: None.  Left foraminal stenosis: None.  Right foraminal stenosis: None.     L2-3:   Disc: Trace broad-based disc bulge. Bilateral intraforaminal extension of disc.  Canal stenosis: Minimal. Left subarticular zone is minimally effaced.  Left foraminal stenosis: None.  Right foraminal stenosis: None.     L3-4:   Disc: Small left eccentric disc bulge.  Canal stenosis: Minimal. Left subarticular zone is mildly effaced.  Left foraminal stenosis: None.  Right foraminal stenosis: None.     L4-5:   Disc: Small broad-based disc herniation with osteophytic ridging. Intraforaminal extension of osteophyte on the left.  Canal stenosis: Mild.  Both subarticular zones are effaced, moderate left and mild right.  Left foraminal stenosis: Moderate to advanced.  Right foraminal stenosis: Mild.     L5-S1:   Disc: No herniation or bulge. Left intraforaminal osteophytic ridging.  Canal stenosis: None.  Left foraminal stenosis: Moderate.  Right foraminal stenosis: None.  Comment: Facet arthropathy is moderate to advanced.    XR of the LS and SIJ dated: 09/02/20  Moderate scoliosis of the lumbar spine. Advanced degenerative disc disease throughout the lumbar spine.  Scoliosis  of the lumbar spine with advanced degenerative disc disease.  No evidence of SI joint fusion.  There are granulomas in the spleen. [Courtesy letter sent to Ellender Hose, MD regarding incidental findings]      EMG/NCV Review:   No EMG on file to review.        ODI: 62%    Impression:  1. 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     Plan:  - We will request PRIOR procedure notes from Dr. Ethelene Hal  - We will request recent lower limb EMG/NCS  - We will then move forward with 2-4 bilateral S1 vs multilevel transforaminal epidural steroid injections.  - Risks and benefits of the procedures explained to the patient as well as the use of off label corticosteroid in the epidural space.  - If no sustained relief, we will move forward with dx left VS right L345 MBB#1.        Landry Mellow, DO   [facility] Interventional Spine    Thank you for allowing me to be involved in the care of this patient.  If you have any questions please contact me at [facility] Interventional Spine at [phone]    CC:  Self Referral, Dr. Lynnda Shields.

## 2020-09-21 ENCOUNTER — Encounter (INDEPENDENT_AMBULATORY_CARE_PROVIDER_SITE_OTHER): Payer: Self-pay | Admitting: Physical Medicine & Rehabilitation

## 2020-09-21 ENCOUNTER — Ambulatory Visit (INDEPENDENT_AMBULATORY_CARE_PROVIDER_SITE_OTHER): Payer: Medicare Other

## 2020-09-21 ENCOUNTER — Ambulatory Visit (INDEPENDENT_AMBULATORY_CARE_PROVIDER_SITE_OTHER): Payer: Medicare Other | Admitting: Physical Medicine & Rehabilitation

## 2020-09-21 DIAGNOSIS — M47817 Spondylosis without myelopathy or radiculopathy, lumbosacral region: Secondary | ICD-10-CM

## 2020-09-21 DIAGNOSIS — M5137 Other intervertebral disc degeneration, lumbosacral region: Secondary | ICD-10-CM

## 2020-09-21 MED ORDER — IOHEXOL 240 MG/ML IJ SOLN
50.0000 mL | Freq: Once | INTRAMUSCULAR | Status: AC
Start: 2020-09-21 — End: 2020-09-21
  Administered 2020-09-21: 12:00:00 50 mL

## 2020-09-21 MED ORDER — TRIAMCINOLONE ACETONIDE 40 MG/ML IJ SUSP
80.0000 mg | Freq: Once | INTRAMUSCULAR | Status: AC
Start: 2020-09-21 — End: 2020-09-21
  Administered 2020-09-21: 12:00:00 80 mg

## 2020-09-21 NOTE — Procedures (Signed)
Procedure performed:  Therapeutic Right S1 TFESI  Reason for procedure: Internal disc disruption, M51.37, M51.36  Physician: Dr. Landry Mellow  PROCEDURE: After obtaining informed consent, the patient was placed in the prone position on the fluoroscopy table. A time out was held for patient verification. Cycled one minute blood pressure, pulse, and pulse oximetry was maintained. The lumbar skin was prepped and draped in a sterile fashion with Chloraprep and sterile towels. Conscious sedation was not administered.  After raising a skin wheal with 1 percent Xylocaine and anesthetizing the subcutaneous tissues, a #22 gauge 3.5 inch spinal needle was placed under fluoroscopic control to the (right) (S1) foramen angled caudally.  Approximately 1-2 milliliters of Ominpaque 240 contrast dye was injected, demonstrating S1 perineural and anterior epidural spread without vascular uptake in the anteroposterior and oblique views. There was opacification of the nerve root sheath with epidural reflux of contrast material extending into the anterior epidural space at the segmental level. A total of 1 milliliter of 1 percent Xylocaine and 1.66mls of 40mg  Kenalogwas instilled around the nerve root. The patient tolerated the procedure well and was transitioned to the post procedure holding area uneventfully. The patient was monitored for adverse allergic, paralytic and hypertensive reactions. The patient was discharged without complication.     Transforaminal epidural steroid injection  Procedure performed:  Therapeutic Left S1 TFESI  Reason for procedure: Internal disc disruption, M51.37, M51.36  Physician: Dr. Landry Mellow  PROCEDURE: After obtaining informed consent, the patient was placed in the prone position on the fluoroscopy table. A time out was held for patient verification. Cycled one minute blood pressure, pulse, and pulse oximetry was maintained. The lumbar skin was prepped and draped in a sterile fashion with Chloraprep  and sterile towels. Conscious sedation was not administered.  After raising a skin wheal with 1 percent Xylocaine and anesthetizing the subcutaneous tissues, a #22 gauge 3.5 inch spinal needle was placed under fluoroscopic control to the (left) (S1) foramen angled caudally.  Approximately 1-2 milliliters of Ominpaque 240 contrast dye was injected, demonstrating nonspecific contrast flow, the procedure was subsequently terminated on this side.  The patient tolerated the procedure well and was transitioned to the post procedure holding area uneventfully. The patient was monitored for adverse allergic, paralytic and hypertensive reactions. The patient was discharged without complication.

## 2020-10-06 ENCOUNTER — Encounter (INDEPENDENT_AMBULATORY_CARE_PROVIDER_SITE_OTHER): Payer: Medicare Other

## 2020-10-06 ENCOUNTER — Ambulatory Visit (INDEPENDENT_AMBULATORY_CARE_PROVIDER_SITE_OTHER): Payer: Medicare Other | Admitting: Physical Medicine & Rehabilitation

## 2020-10-06 DIAGNOSIS — M5137 Other intervertebral disc degeneration, lumbosacral region: Secondary | ICD-10-CM

## 2020-10-06 DIAGNOSIS — M47817 Spondylosis without myelopathy or radiculopathy, lumbosacral region: Secondary | ICD-10-CM

## 2020-10-06 MED ORDER — TRIAMCINOLONE ACETONIDE 40 MG/ML IJ SUSP
80.0000 mg | Freq: Once | INTRAMUSCULAR | Status: AC
Start: 2020-10-06 — End: 2020-10-06
  Administered 2020-10-06: 12:00:00 80 mg

## 2020-10-06 MED ORDER — IOHEXOL 240 MG/ML IJ SOLN
50.0000 mL | Freq: Once | INTRAMUSCULAR | Status: AC
Start: 2020-10-06 — End: 2020-10-06
  Administered 2020-10-06: 12:00:00 50 mL

## 2020-10-06 NOTE — Procedures (Unsigned)
Procedure performed:  Therapeutic Right S1 TFESI  Reason for procedure: Internal disc disruption, M51.37, M51.36  Physician: Dr. Taira Knabe  PROCEDURE: After obtaining informed consent, the patient was placed in the prone position on the fluoroscopy table. A time out was held for patient verification. Cycled one minute blood pressure, pulse, and pulse oximetry was maintained. The lumbar skin was prepped and draped in a sterile fashion with Chloraprep and sterile towels. Conscious sedation was not administered.  After raising a skin wheal with 1 percent Xylocaine and anesthetizing the subcutaneous tissues, a #22 gauge 3.5 inch spinal needle was placed under fluoroscopic control to the (right) (S1) foramen angled caudally.  Approximately 1-2 milliliters of Ominpaque 240 contrast dye was injected, demonstrating S1 perineural and anterior epidural spread without vascular uptake in the anteroposterior and oblique views. There was opacification of the nerve root sheath with epidural reflux of contrast material extending into the anterior epidural space at the segmental level. A total of 1 milliliter of 1 percent Xylocaine and 1.0mls of 40mg Kenalogwas instilled around the nerve root. The patient tolerated the procedure well and was transitioned to the post procedure holding area uneventfully. The patient was monitored for adverse allergic, paralytic and hypertensive reactions. The patient was discharged without complication.     Transforaminal epidural steroid injection  Procedure performed:  Therapeutic Left S1 TFESI  Reason for procedure: Internal disc disruption, M51.37, M51.36  Physician: Dr. Kelcee Bjorn  PROCEDURE: After obtaining informed consent, the patient was placed in the prone position on the fluoroscopy table. A time out was held for patient verification. Cycled one minute blood pressure, pulse, and pulse oximetry was maintained. The lumbar skin was prepped and draped in a sterile fashion with Chloraprep  and sterile towels. Conscious sedation was not administered.  After raising a skin wheal with 1 percent Xylocaine and anesthetizing the subcutaneous tissues, a #22 gauge 3.5 inch spinal needle was placed under fluoroscopic control to the (left) (S1) foramen angled caudally.  Approximately 1-2 milliliters of Ominpaque 240 contrast dye was injected, demonstrating S1 perineural and anterior epidural spread without vascular uptake in the anteroposterior and oblique views. There was opacification of the nerve root sheath with epidural reflux of contrast material extending into the anterior epidural space at the segmental level. A total of 1 milliliter of 1 percent Xylocaine and 1.0mls of 40mg Kenalog was instilled around the nerve root. The patient tolerated the procedure well and was transitioned to the post procedure holding area uneventfully. The patient was monitored for adverse allergic, paralytic and hypertensive reactions. The patient was discharged without complication.

## 2020-10-27 ENCOUNTER — Ambulatory Visit (INDEPENDENT_AMBULATORY_CARE_PROVIDER_SITE_OTHER): Payer: Medicare Other | Admitting: Medical

## 2020-10-27 ENCOUNTER — Encounter (INDEPENDENT_AMBULATORY_CARE_PROVIDER_SITE_OTHER): Payer: Self-pay | Admitting: Medical

## 2020-10-27 VITALS — BP 146/51 | HR 73 | Wt 210.0 lb

## 2020-10-27 DIAGNOSIS — M47817 Spondylosis without myelopathy or radiculopathy, lumbosacral region: Secondary | ICD-10-CM

## 2020-10-27 NOTE — Progress Notes (Signed)
Post Procedure Evaluation      Patient Name:                                                 [name] [name]  1931-07-14  MRN  [id number]      History of Present Illness:    As you know, [name] [name]  is a pleasant 87 y.o., male with the chief complaint of axial low back pain located in the midline and bilateral paramidline lumbosacral region. Denies lower limb pain.  The pain is 4 out of 10 in severity, and aching, dull in quality.  Onset gradual and spontaneous , 12 years duration.  Exacerbating factors include standing and walking.  Alleviating factors include sitting . Associated symptoms include: are denied by patient.     Patient endorses "years" of pain/spine management historically. Management has include:   - 2x SCS trial: Nevro and Abbott, negative trials.   - LS epidural steroid injections, no sustained relief.   - LS MBBs/RFAs, denies improvement.   - patient has been referred to NSG/orthopedics for surgical evaluation. Per report, ~ 5 surgeons. Patient endorses that he was not a surgical candidate.   - Patient has trial: gabapentin, topamax, oxycontin, APAP and muscle relaxers for PO analgesia. Denies significant benefit with these and other similar medications.      Patient presents today to determine if anything else can be done for his index pain.      Additional historical information:   - patient with traumatic right humeral fracture (11/2019) s/p repair. 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 Currently OFF Xarelto.          Prior Treatments: acupuncture, PT, HEP   - s/p recent ESI (05/30/20) with Dr. Ethelene Hal at outside facility with ~ 50% improvement of his index pain.     Prior Medications: ibu, gabapentin, topical agents    Interval History:     He is s/p bilateral S1 TFESI (10/06/20) and reports <25% improvement since his  procedure.    He reports ongoing axial low back pain and lower limb pain located in the midline and bilateral paramidline  lumbosacral region. He denies lower limb pain.    Alleviating factors: sleep, sitting.   Exacerbating factors: standing, walking,  Index pain continues to limit activities of daily living.  Pain is 5 out of 10.        Medications:     Current Outpatient Medications   Medication Instructions    B Complex-C (B-complex with vitamin C) tablet 1 tablet, Oral, Daily    Cyanocobalamin 1000 MCG Tab CR 1 tablet, Oral, Daily    diazePAM (VALIUM) 10 mg, Oral    FLUoxetine (PROZAC) 10 mg, Oral, Daily    MELATONIN PO 1 tablet, Oral    mirtazapine (REMERON) 30 MG tablet 1 tablet, Oral, At bedtime PRN    pravastatin (PRAVACHOL) 40 mg, Oral, Daily    tiZANidine (ZANAFLEX) 4 mg, Oral    vitamin D3 (CHOLECALCIFEROL) 5,000 Units, Oral, Daily    Zinc Methionate 50 mg, Oral, Daily        Objective:   There were no vitals filed for this visit.    Review Of Systems:    Review of Systems   Constitutional:  Positive for activity change.   HENT: Negative.     Eyes: Negative.    Respiratory: Negative.  Cardiovascular: Negative.    Gastrointestinal: Negative.    Endocrine: Negative.    Genitourinary: Negative.    Musculoskeletal:  Positive for back pain. Negative for arthralgias, gait problem, myalgias and neck pain.   Skin: Negative.    Allergic/Immunologic: Negative.    Neurological:  Negative for weakness.   Psychiatric/Behavioral:  Negative for sleep disturbance.       Physical Examination:    Physical Exam  Constitutional:       General: He is not in acute distress.     Appearance: Normal appearance. He is not ill-appearing or diaphoretic.   HENT:      Head: Normocephalic and atraumatic.   Cardiovascular:      Rate and Rhythm: Normal rate.   Pulmonary:      Effort: Pulmonary effort is normal. No respiratory distress.   Musculoskeletal:      Lumbar back: No tenderness. Decreased range of motion. Negative right straight leg raise test (seated) and negative left straight leg raise test (seated).      Right hip: Normal.      Left hip:  Normal.      Right upper leg: Normal.      Left upper leg: Normal.      Right lower leg: Normal.      Left lower leg: Normal.      Right ankle: Normal.      Left ankle: Normal.      Right foot: Normal.      Left foot: Normal.      Comments: SHF deferred due to pain with lying flat.     (+) lumbar facet loading bilaterally.    Skin:     General: Skin is warm and dry.   Neurological:      General: No focal deficit present.      Mental Status: He is alert. Mental status is at baseline.      Gait: Gait is intact. Gait normal.      Deep Tendon Reflexes:      Reflex Scores:       Patellar reflexes are 2+ on the right side and 2+ on the left side.       Achilles reflexes are 1+ on the right side and 1+ on the left side.  Psychiatric:         Mood and Affect: Mood normal.         Behavior: Behavior normal.         Thought Content: Thought content normal.         Judgment: Judgment normal.        ODI/NDI: 37    Imaging:  No results found.       Impression/Plan:     Impression:  No diagnosis found.   - 1. 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     Plan:  - The patient will continue spine focused physical therapy to improve biomechanics and reduce strain on the spine during recreational and occupational activities.  - Patient will continue PRN Tylenol 1000mg  TID for PO analgesia.  Extra Strength Tylenol 500mg  Take 2 tablets (1000mg ) three times daily as needed for pain. Do not exceed 3000mg  per day   - Continue with optimizing heat/cold modalities as well as OTC topical creams/gels for superficial analgesia.  - As per Dr. Juanda Crumble: we will move forward with dx left VS right L345  MBB#1.  - Risks and benefits of the procedures explained to the patient.  - If no sustained relief, patient will RTC with Dr. Juanda Crumble for follow up.     Audie Clear, PA   [facility]    Thank you for allowing me to be  involved in the care of this patient.  If you have any questions please contact me at [facility] at [phone]    CC: Snelgrove, Laqueta Due, MD

## 2020-11-11 ENCOUNTER — Encounter (INDEPENDENT_AMBULATORY_CARE_PROVIDER_SITE_OTHER): Payer: Self-pay | Admitting: Physical Medicine & Rehabilitation

## 2020-11-11 ENCOUNTER — Ambulatory Visit (INDEPENDENT_AMBULATORY_CARE_PROVIDER_SITE_OTHER): Payer: Medicare Other | Admitting: Physical Medicine & Rehabilitation

## 2020-11-11 ENCOUNTER — Encounter (INDEPENDENT_AMBULATORY_CARE_PROVIDER_SITE_OTHER): Payer: Medicare Other

## 2020-11-11 ENCOUNTER — Other Ambulatory Visit (INDEPENDENT_AMBULATORY_CARE_PROVIDER_SITE_OTHER): Payer: Self-pay | Admitting: Medical

## 2020-11-11 DIAGNOSIS — M47817 Spondylosis without myelopathy or radiculopathy, lumbosacral region: Secondary | ICD-10-CM

## 2020-11-11 DIAGNOSIS — M47816 Spondylosis without myelopathy or radiculopathy, lumbar region: Secondary | ICD-10-CM

## 2020-11-11 MED ORDER — IOHEXOL 240 MG/ML IJ SOLN
50.0000 mL | Freq: Once | INTRAMUSCULAR | Status: AC
Start: 2020-11-11 — End: 2020-11-11
  Administered 2020-11-11: 50 mL

## 2020-11-11 NOTE — Procedures (Signed)
Diagnostic LEFT L3, L4 Medial Branch and L5 Dorsal Ramus Block #1  PROCEDURES PERFORMED:Diagnostic LEFT L3, L4 medial branch and L5 dorsal ramus blockade.  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  SURGEON:  Dr. Landry Mellow        PROCEDURE:After obtaining informed consent, the patient was placed in the prone position on the fluoroscopy table. A time out was held for patient verification. Cycled one minute blood pressure, pulse, and pulse oximetry was maintained. The lumbosacral skin was prepped and draped in a sterile fashion with Chloraprep and sterile towels. Conscious sedation was not administered. After raising a skin wheal with 1 percent Xylocaine and anesthetizing the subcutaneous tissues, a #22 gauge 3.5 inch spinal needle was placed under fluoroscopic control adjacent to the L3 and L4 medial branch at the junction of the L4 and L5 superior articular and transverse processes. A third #22 gauge 3.5 inch spinal needle was placed under fluoroscopic control adjacent to the L5 dorsal ramus at the junction of the S1 superior articular process and sacral ala. Approximately 0.15 milliliters of Omnipaque 240 contrast dye was injected, demonstrating focal spread at each level. Subsequent to this, a total of 0.5 milliliters of (2% Xylocaine)  was injected onto the L3, L4 medial branch and the L5 dorsal ramus. The patient tolerated the procedure well and was transitioned to the post procedure holding area uneventfully. The patient was monitored for adverse allergic, paralytic and hypertensive reactions. The patient reported (100%) percent improvement in their index pain. The patient was discharged without complication.    Patient reported more severe LEFT lower back pain today.

## 2020-11-17 ENCOUNTER — Ambulatory Visit (INDEPENDENT_AMBULATORY_CARE_PROVIDER_SITE_OTHER): Payer: Medicare Other | Admitting: Physical Medicine & Rehabilitation

## 2020-11-25 ENCOUNTER — Ambulatory Visit (INDEPENDENT_AMBULATORY_CARE_PROVIDER_SITE_OTHER): Payer: Medicare Other | Admitting: Physical Medicine & Rehabilitation

## 2020-12-07 ENCOUNTER — Ambulatory Visit (INDEPENDENT_AMBULATORY_CARE_PROVIDER_SITE_OTHER): Payer: Medicare Other | Admitting: Physical Medicine & Rehabilitation

## 2020-12-22 ENCOUNTER — Ambulatory Visit (INDEPENDENT_AMBULATORY_CARE_PROVIDER_SITE_OTHER): Payer: Medicare Other | Admitting: Physical Medicine/Rehabilitation

## 2020-12-27 ENCOUNTER — Encounter (INDEPENDENT_AMBULATORY_CARE_PROVIDER_SITE_OTHER): Payer: Self-pay | Admitting: Physical Medicine/Rehabilitation

## 2020-12-27 ENCOUNTER — Ambulatory Visit (INDEPENDENT_AMBULATORY_CARE_PROVIDER_SITE_OTHER): Payer: Medicare Other | Admitting: Physical Medicine/Rehabilitation

## 2020-12-27 ENCOUNTER — Encounter (INDEPENDENT_AMBULATORY_CARE_PROVIDER_SITE_OTHER): Payer: Medicare Other

## 2020-12-27 VITALS — BP 152/53 | HR 78 | Temp 98.3°F

## 2020-12-27 DIAGNOSIS — M47816 Spondylosis without myelopathy or radiculopathy, lumbar region: Secondary | ICD-10-CM

## 2020-12-27 DIAGNOSIS — M47817 Spondylosis without myelopathy or radiculopathy, lumbosacral region: Secondary | ICD-10-CM

## 2020-12-27 MED ORDER — IOHEXOL 240 MG/ML IJ SOLN
50.0000 mL | Freq: Once | INTRAMUSCULAR | Status: AC
Start: 2020-12-27 — End: 2020-12-27
  Administered 2020-12-27: 50 mL

## 2020-12-27 NOTE — Progress Notes (Signed)
Patient of Dr. Angelene Giovanni, helping with procedural treatment today due to schedule conflict.    PROCEDURES PERFORMED:  LEFT Diagnostic L3 and L4 and L5 medial branch block #2.  REASON FOR PROCEDURES:    L4-5 and L5-S1 facet joint arthrosis with chronic low back pain.  PHYSICIAN:  Bruce Donath, MD         PROCEDURE: After obtaining informed consent, the patient was placed in the prone position on the fluoroscopy table. A time out was held for patient verification. Cycled one minute blood pressure, pulse, and pulse oximetry was maintained. The lumbosacral skin was prepped and draped in a sterile fashion with Chloraprep and sterile towels. Conscious sedation was not administered. After raising a skin wheal with 1 percent Xylocaine and anesthetizing the subcutaneous tissues, a #22 gauge 5 inch spinal needle was placed under fluoroscopic control adjacent to the L3 medial branch at the junction of the L4 superior articular and transverse processes.   A second #22 gauge 5 inch spinal needle was placed under fluoroscopic control adjacent to the L4 medial branch at the junction of the L5 superior articular and transverse processes. A third #22 gauge 5 inch spinal needle was placed under fluoroscopic control adjacent to the L5 dorsal ramus at the junction of the S1 superior articular process and sacral ala.   Approximately 0.15 milliliters of Ominpaque 240 contrast dye was injected, demonstrating focal spread at each level. Subsequent to this, a total of 0.5 milliliters of (0.5% Marcaine)  was injected onto the L3, L4 medial branch and the L5 dorsal ramus. The patient tolerated the procedure well and was transitioned to the post procedure holding area uneventfully. The patient was monitored for adverse allergic, paralytic and hypertensive reactions.  The patient reported (100%) percent improvement in their index pain. The patient was discharged without complication.

## 2021-01-24 ENCOUNTER — Ambulatory Visit (INDEPENDENT_AMBULATORY_CARE_PROVIDER_SITE_OTHER): Payer: Medicare Other | Admitting: Physical Medicine & Rehabilitation

## 2021-02-07 ENCOUNTER — Ambulatory Visit (INDEPENDENT_AMBULATORY_CARE_PROVIDER_SITE_OTHER): Payer: Medicare Other | Admitting: Physical Medicine/Rehabilitation

## 2021-02-22 ENCOUNTER — Ambulatory Visit (INDEPENDENT_AMBULATORY_CARE_PROVIDER_SITE_OTHER): Payer: Medicare Other | Admitting: Physical Medicine & Rehabilitation

## 2021-03-14 ENCOUNTER — Ambulatory Visit (INDEPENDENT_AMBULATORY_CARE_PROVIDER_SITE_OTHER): Payer: Medicare Other | Admitting: Physical Medicine & Rehabilitation

## 2021-03-14 ENCOUNTER — Encounter (INDEPENDENT_AMBULATORY_CARE_PROVIDER_SITE_OTHER): Payer: Medicare Other

## 2021-03-14 ENCOUNTER — Encounter (INDEPENDENT_AMBULATORY_CARE_PROVIDER_SITE_OTHER): Payer: Self-pay | Admitting: Physical Medicine & Rehabilitation

## 2021-03-14 DIAGNOSIS — M47817 Spondylosis without myelopathy or radiculopathy, lumbosacral region: Secondary | ICD-10-CM

## 2021-03-14 DIAGNOSIS — M47816 Spondylosis without myelopathy or radiculopathy, lumbar region: Secondary | ICD-10-CM

## 2021-03-14 NOTE — Procedures (Unsigned)
LEFT L3 and L4 medial branch L5 dorsal ramus radiofrequency ablation neurotomy.  REASON FOR PROCEDURES:LEFT L45 and L5S1 facet joint arthrosis with chronic low back pain. M47.817, M47.816  Surgeon: Xeng Kucher        PROCEDURE: After obtaining informed consent, the patient was placed in the prone position on the fluoroscopy table. A time out was held for patient verification. Cycled one minute blood pressure, pulse, and pulse oximetry was maintained. The lumbosacral skin was prepped and draped in a sterile fashion with Chloraprep and sterile towels. Conscious sedation was not administered. After raising a skin wheal with 1 percent Xylocaine and anesthetizing the subcutaneous tissues, a #18-gauge 15 centimeter radiofrequency spinal needle with a 10 millimeters curved active tip was placed under fluoroscopic control at the junction of the superior aspect of the L4 and L5 and the lateral aspect of the L4 and L5 superior articulating process. The probe was then advanced 2 to 3 millimeters to lie along the path of the L3 and L4 medial branch nerves. Anteroposterior, lateral, and oblique fluoroscopic views demonstrated proper probe positioning. Sensory stimulation was performed at 50 Hertz eliciting deep local back discomfort reliably between 0.3 to 0.8 volts without the occurrence of radicular pain. Motor stimulation was then performed at 2 Hertz eliciting local multifidus contractions reliably between 0.8 to 1.5 volts without evidence of lower limb myotomal contraction up to 2 to 3 volts. 0.75 milliliters of 2 percent  Xylocaine was then injected over the  L3, L4 medial branch nerves. Single lesions were created by heating the probe to 80 degrees Celsius for 2.5 min first with the curved tip rotated into the groove. The impedance ranged between 185 to 275 Ohms.  Next, a similar procedure was performed on the L5 dorsal ramus with the probe placed lateral to the S1 superior articulating process. Sensory stimulation at 50  Hertz demonstrated deep local back discomfort reliably between 0.3 to 0.8 volts without radicular pain. Motor stimulation at 2 Hertz elicited local multifidus contractions reliably between 0.8 to1.5 volts without evidence of lower limb myotomal contractions up to 2 to 3 volts. 0.75 milliliters of 2 percent Xylocaine was then injected over the L5 dorsal ramus. Single lesion was created by heating the probe to 80 degrees Celsius for 2.5 min first with the curved tip rotated into the groove. The impedance ranged between 185 to 275 Ohms. The patient tolerated the procedure well and was transitioned to the post procedure holding area uneventfully. The patient was monitored for adverse allergic, paralytic and hypertensive reactions. The patient was discharged without complication.

## 2021-04-06 ENCOUNTER — Ambulatory Visit (INDEPENDENT_AMBULATORY_CARE_PROVIDER_SITE_OTHER): Payer: Medicare Other | Admitting: Medical

## 2021-04-06 ENCOUNTER — Encounter (INDEPENDENT_AMBULATORY_CARE_PROVIDER_SITE_OTHER): Payer: Self-pay | Admitting: Medical

## 2021-04-06 VITALS — BP 154/63 | HR 69

## 2021-04-06 DIAGNOSIS — M47817 Spondylosis without myelopathy or radiculopathy, lumbosacral region: Secondary | ICD-10-CM

## 2021-04-06 NOTE — Progress Notes (Signed)
Post Procedure Evaluation      Patient Name:                                                 Doyle AskewGREGORY,Keagan  DOB  02/25/1931  MRN  [id number]      History of Present Illness (04/06/20):    As you know, Ailene RavelFred Croson  is a pleasant 88 y.o., male with the chief complaint of axial low back pain located in the midline and bilateral paramidline lumbosacral region. Denies lower limb pain.  The pain is 4 out of 10 in severity, and aching, dull in quality.  Onset gradual and spontaneous , 12 years duration.  Exacerbating factors include standing and walking.  Alleviating factors include sitting . Associated symptoms include: are denied by patient.    Patient endorses "years" of pain/spine management historically. Management has include:   - 2x SCS trial: Nevro and Abbott, negative trials.   - LS epidural steroid injections, no sustained relief.   - LS MBBs/RFAs, denies improvement.   - patient has been referred to NSG/orthopedics for surgical evaluation. Per report, ~ 5 surgeons. Patient endorses that he was not a surgical candidate.   - Patient has trial: gabapentin, topamax, oxycontin, APAP and muscle relaxers for PO analgesia. Denies significant benefit with these and other similar medications.      Patient presents today to determine if anything else can be done for his index pain.      Additional historical information:   - patient with traumatic right humeral fracture (11/2019) s/p repair. 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 Currently OFF Xarelto.          Prior Treatments:  acupuncture, PT, HEP   - s/p recent ESI (05/30/20) with Dr. Ethelene Halamos at outside facility with ~ 50% improvement of his index pain.   He is s/p bilateral S1 TFESI (10/06/20) and reports <25% improvement since his  procedure.     Prior Medications: ibu, gabapentin, topical agents    Interval History:     He is s/p LEFT L3 and L4 medial branch L5 dorsal ramus radiofrequency ablation neurotomy (03/14/21) and  reports 25-50% improvement since his  procedure.      He states he has no pain with sitting and lying down. He does report being able to increase his distance walking. He states he would like to hold off on further injections      Medications:     Current Outpatient Medications   Medication Instructions    B Complex-C (B-complex with vitamin C) tablet 1 tablet, Oral, Daily    Cyanocobalamin 1000 MCG Tab CR 1 tablet, Oral, Daily    diazePAM (VALIUM) 10 mg, Oral    FLUoxetine (PROZAC) 10 mg, Oral, Daily    furosemide (LASIX) 20 MG tablet No dose, route, or frequency recorded.    melatonin 5 mg tablet 1 tablet in the evening    MELATONIN PO 1 tablet, Oral    mirtazapine (REMERON) 30 MG tablet 1 tablet, Oral, At bedtime PRN    pravastatin (PRAVACHOL) 40 mg, Oral, Daily    predniSONE (DELTASONE) 10 MG tablet No dose, route, or frequency recorded.    tiZANidine (ZANAFLEX) 4 mg    vitamin D3 (CHOLECALCIFEROL) 5,000 Units, Oral, Daily    Zinc Methionate 50 mg, Oral, Daily        Objective:  Vitals:    04/06/21 1436   BP: 154/63   Pulse: 69   SpO2: 98%       Review Of Systems:    Review of Systems   Constitutional:  Negative for activity change.   Musculoskeletal:  Positive for back pain.      Physical Examination:    Physical Exam  Constitutional:       Appearance: Normal appearance.   Musculoskeletal:      Lumbar back: Decreased range of motion. Negative right straight leg raise test (seated) and negative left straight leg raise test (seated). Scoliosis present.      Right hip: Normal.      Left hip: Normal.      Right upper leg: Normal.      Left upper leg: Normal.      Right knee: Normal.      Left knee: Normal.      Right lower leg: Normal.      Left lower leg: Normal.      Comments: Lumbar facet loading bilaterally.    Neurological:      Mental Status: He is alert.      Gait: Gait abnormal (kyphotic).      Comments:  (-) no clonus at the ankle bilaterally.          ODI/NDI: --    Imaging:  No results found.        Impression/Plan:     Impression:  No diagnosis found.   1. Axial lower back pain c/w bilateral L5S1 vs L45 vs L34 painful facet joint arthrosis vs bilateral painful SIJ pathology vs IDD at L5S1 vs L45 vs L34 vs.    - Improved, limiting.       Plan:  - Patient will continue PRN Tylenol 1000mg  TID for PO analgesia.  Extra Strength Tylenol 500mg  Take 2 tablets (1000mg ) three times daily as needed for pain. Do not exceed 3000mg  per day   - Continue with optimizing heat/cold modalities as well as OTC topical creams/gels for superficial analgesia.  - RTC PRN as per patient.   - low threshold to move forward with RIGHT MBBs. Patient declines at this time.     , PA   [facility]    Thank you for allowing me to be involved in the care of this patient.  If you have any questions please contact me at [facility] Spine at [phone]    CC: Snelgrove, Audie Clear, MD

## 2021-05-15 ENCOUNTER — Inpatient Hospital Stay
Admission: EM | Admit: 2021-05-15 | Discharge: 2021-05-16 | DRG: 379 | Disposition: A | Discharge status: Home / Self Care | Payer: Medicare Other | Attending: Internal Medicine | Admitting: Internal Medicine

## 2021-05-15 ENCOUNTER — Emergency Department: Payer: Medicare Other

## 2021-05-15 DIAGNOSIS — K648 Other hemorrhoids: Secondary | ICD-10-CM | POA: Diagnosis present

## 2021-05-15 DIAGNOSIS — K625 Hemorrhage of anus and rectum: Principal | ICD-10-CM | POA: Diagnosis present

## 2021-05-15 DIAGNOSIS — K922 Gastrointestinal hemorrhage, unspecified: Secondary | ICD-10-CM

## 2021-05-15 DIAGNOSIS — Z87891 Personal history of nicotine dependence: Secondary | ICD-10-CM

## 2021-05-15 DIAGNOSIS — E785 Hyperlipidemia, unspecified: Secondary | ICD-10-CM | POA: Diagnosis present

## 2021-05-15 DIAGNOSIS — Z7901 Long term (current) use of anticoagulants: Secondary | ICD-10-CM

## 2021-05-15 DIAGNOSIS — K5731 Diverticulosis of large intestine without perforation or abscess with bleeding: Principal | ICD-10-CM | POA: Diagnosis present

## 2021-05-15 DIAGNOSIS — K635 Polyp of colon: Secondary | ICD-10-CM | POA: Diagnosis present

## 2021-05-15 DIAGNOSIS — R1030 Lower abdominal pain, unspecified: Secondary | ICD-10-CM

## 2021-05-15 DIAGNOSIS — Z66 Do not resuscitate: Secondary | ICD-10-CM | POA: Diagnosis present

## 2021-05-15 DIAGNOSIS — Z7982 Long term (current) use of aspirin: Secondary | ICD-10-CM

## 2021-05-15 LAB — CBC AND DIFFERENTIAL
Basophils %: 0.4 % (ref 0.0–3.0)
Basophils Absolute: 0 10*3/uL (ref 0.0–0.3)
Eosinophils %: 0.7 % (ref 0.0–7.0)
Eosinophils Absolute: 0.1 10*3/uL (ref 0.0–0.8)
Hematocrit: 50.7 % (ref 39.0–52.5)
Hemoglobin: 17.5 gm/dL (ref 13.0–17.5)
Lymphocytes Absolute: 1.9 10*3/uL (ref 0.6–5.1)
Lymphocytes: 17.4 % (ref 15.0–46.0)
MCH: 38 pg — ABNORMAL HIGH (ref 28–35)
MCHC: 34 gm/dL (ref 32–36)
MCV: 109 fL — ABNORMAL HIGH (ref 80–100)
MPV: 8.1 fL (ref 6.0–10.0)
Monocytes Absolute: 1.6 10*3/uL (ref 0.1–1.7)
Monocytes: 14 % (ref 3.0–15.0)
Neutrophils %: 67.5 % (ref 42.0–78.0)
Neutrophils Absolute: 7.5 10*3/uL (ref 1.7–8.6)
PLT CT: 268 10*3/uL (ref 130–440)
RBC: 4.66 10*6/uL (ref 4.00–5.70)
RDW: 12.1 % (ref 11.0–14.0)
WBC: 11.1 10*3/uL — ABNORMAL HIGH (ref 4.0–11.0)

## 2021-05-15 LAB — BASIC METABOLIC PANEL
Anion Gap: 11.2 mMol/L (ref 7.0–18.0)
BUN / Creatinine Ratio: 19.5 Ratio (ref 10.0–30.0)
BUN: 23 mg/dL — ABNORMAL HIGH (ref 7–22)
CO2: 34 mMol/L — ABNORMAL HIGH (ref 20–30)
Calcium: 9.8 mg/dL (ref 8.5–10.5)
Chloride: 98 mMol/L (ref 98–110)
Creatinine: 1.18 mg/dL (ref 0.80–1.30)
EGFR: 60 mL/min/{1.73_m2} (ref 60–150)
Glucose: 99 mg/dL (ref 71–99)
Osmolality Calculated: 281 mOsm/kg (ref 275–300)
Potassium: 4.2 mMol/L (ref 3.5–5.3)
Sodium: 139 mMol/L (ref 136–147)

## 2021-05-15 LAB — TYPE AND SCREEN
AB Screen: NEGATIVE
ABO Rh: O POS

## 2021-05-15 LAB — PT AND APTT
PT INR: 1 (ref 0.9–1.1)
PT: 10.5 s (ref 9.4–11.5)
aPTT: 23 s — ABNORMAL LOW (ref 24.0–34.0)

## 2021-05-15 LAB — HEMOGLOBIN AND HEMATOCRIT, BLOOD
Hematocrit: 48.6 % (ref 39.0–52.5)
Hemoglobin: 16.6 gm/dL (ref 13.0–17.5)

## 2021-05-15 LAB — VH EXTRA SPECIMEN LABEL

## 2021-05-15 LAB — PT/INR
PT INR: 1 (ref 0.9–1.1)
PT: 10.3 s (ref 9.4–11.5)

## 2021-05-15 MED ORDER — MELATONIN 3 MG PO TABS
5.0000 mg | ORAL_TABLET | Freq: Every evening | ORAL | Status: DC
Start: 2021-05-15 — End: 2021-05-16
  Administered 2021-05-15: 4.5 mg via ORAL
  Filled 2021-05-15: qty 2

## 2021-05-15 MED ORDER — FLUOXETINE HCL 10 MG PO CAPS
10.0000 mg | ORAL_CAPSULE | Freq: Every morning | ORAL | Status: DC
Start: 2021-05-16 — End: 2021-05-16
  Administered 2021-05-16: 10 mg via ORAL
  Filled 2021-05-15: qty 1

## 2021-05-15 MED ORDER — ONDANSETRON HCL 4 MG/2ML IJ SOLN
4.0000 mg | Freq: Three times a day (TID) | INTRAMUSCULAR | Status: DC | PRN
Start: 2021-05-15 — End: 2021-05-16

## 2021-05-15 MED ORDER — ONDANSETRON 4 MG PO TBDP
4.0000 mg | ORAL_TABLET | Freq: Three times a day (TID) | ORAL | Status: DC | PRN
Start: 2021-05-15 — End: 2021-05-16

## 2021-05-15 MED ORDER — VH LACTATED RINGERS IV BOLUS
1000.0000 mL | Freq: Once | INTRAVENOUS | Status: AC
Start: 2021-05-15 — End: 2021-05-15
  Administered 2021-05-15: 1000 mL via INTRAVENOUS

## 2021-05-15 MED ORDER — FUROSEMIDE 20 MG PO TABS
20.0000 mg | ORAL_TABLET | Freq: Three times a day (TID) | ORAL | Status: DC
Start: 2021-05-17 — End: 2021-05-16

## 2021-05-15 MED ORDER — ACETAMINOPHEN 325 MG PO TABS
650.0000 mg | ORAL_TABLET | ORAL | Status: DC | PRN
Start: 2021-05-15 — End: 2021-05-16

## 2021-05-15 MED ORDER — SODIUM CHLORIDE (PF) 0.9 % IJ SOLN
3.0000 mL | Freq: Two times a day (BID) | INTRAMUSCULAR | Status: DC
Start: 2021-05-15 — End: 2021-05-16
  Administered 2021-05-15 – 2021-05-16 (×2): 3 mL via INTRAVENOUS

## 2021-05-15 MED ORDER — ACETAMINOPHEN 650 MG RE SUPP
650.0000 mg | RECTAL | Status: DC | PRN
Start: 2021-05-15 — End: 2021-05-16

## 2021-05-15 MED ORDER — SODIUM CHLORIDE (PF) 0.9 % IJ SOLN
0.4000 mg | INTRAMUSCULAR | Status: DC | PRN
Start: 2021-05-15 — End: 2021-05-16

## 2021-05-15 MED ORDER — SODIUM CHLORIDE (PF) 0.9 % IJ SOLN
40.0000 mg | Freq: Every day | INTRAVENOUS | Status: DC
Start: 2021-05-15 — End: 2021-05-16
  Administered 2021-05-15 – 2021-05-16 (×2): 40 mg via INTRAVENOUS
  Filled 2021-05-15 (×2): qty 40

## 2021-05-15 MED ORDER — IOHEXOL 350 MG/ML IV SOLN
100.0000 mL | Freq: Once | INTRAVENOUS | Status: AC | PRN
Start: 2021-05-15 — End: 2021-05-15
  Administered 2021-05-15: 100 mL via INTRAVENOUS

## 2021-05-15 MED ORDER — ACETAMINOPHEN 160 MG/5ML PO SOLN
650.0000 mg | ORAL | Status: DC | PRN
Start: 2021-05-15 — End: 2021-05-16

## 2021-05-15 MED ORDER — PEG 3350-KCL-NABCB-NACL-NASULF 236 G PO SOLR
2.0000 L | Freq: Once | ORAL | Status: AC
Start: 2021-05-16 — End: 2021-05-16
  Administered 2021-05-16: 2000 mL via ORAL
  Filled 2021-05-15: qty 4000

## 2021-05-15 MED ORDER — VH POTASSIUM CHLORIDE CRYS ER 20 MEQ PO TBCR (WRAP)
20.0000 meq | EXTENDED_RELEASE_TABLET | Freq: Every morning | ORAL | Status: DC
Start: 2021-05-15 — End: 2021-05-15

## 2021-05-15 MED ORDER — PRAVASTATIN SODIUM 40 MG PO TABS
40.0000 mg | ORAL_TABLET | Freq: Every morning | ORAL | Status: DC
Start: 2021-05-16 — End: 2021-05-16
  Administered 2021-05-16: 40 mg via ORAL
  Filled 2021-05-15: qty 1

## 2021-05-15 MED ORDER — MIRTAZAPINE 30 MG PO TABS
30.0000 mg | ORAL_TABLET | Freq: Every evening | ORAL | Status: DC
Start: 2021-05-15 — End: 2021-05-16
  Administered 2021-05-15: 30 mg via ORAL
  Filled 2021-05-15: qty 1

## 2021-05-15 MED ORDER — VH POTASSIUM CHLORIDE CRYS ER 20 MEQ PO TBCR (WRAP)
20.0000 meq | EXTENDED_RELEASE_TABLET | Freq: Every morning | ORAL | Status: DC
Start: 2021-05-17 — End: 2021-05-16

## 2021-05-15 MED ORDER — DIAZEPAM 5 MG PO TABS
10.0000 mg | ORAL_TABLET | Freq: Three times a day (TID) | ORAL | Status: DC | PRN
Start: 2021-05-15 — End: 2021-05-16

## 2021-05-15 MED ORDER — PEG 3350-KCL-NABCB-NACL-NASULF 236 G PO SOLR
2.0000 L | Freq: Once | ORAL | Status: AC
Start: 2021-05-15 — End: 2021-05-15
  Administered 2021-05-15: 2000 mL via ORAL
  Filled 2021-05-15: qty 4000

## 2021-05-15 MED ORDER — VITAMIN B-12 1000 MCG PO TABS
1000.0000 ug | ORAL_TABLET | Freq: Every day | ORAL | Status: DC
Start: 2021-05-15 — End: 2021-05-16
  Administered 2021-05-15 – 2021-05-16 (×2): 1000 ug via ORAL
  Filled 2021-05-15 (×2): qty 1

## 2021-05-15 NOTE — H&P (Signed)
Medicine History & Physical   [facility], [place]   Patient Name: [name] [name],[name] [name] LOS: 0 days   Attending Physician: Laney Pastor, MD;Abrah* PCP: Ellender Hose, MD      Assessment and Plan:                                                                Bleeding per rectum: Patient getting admitted to medical floor.  GI consultation requested.  Patient will be on clear liquids.  Ordered for colonoscopy prep with GoLytely.  Patient will have H&H every 6 hours.    Dyslipidemia: Continue pravastatin per home dose.    Muscle spasms: Continue Valium as needed for muscle spasms per home dose.    DVT PPx: Contraindicated due to GI bleeding.  Dispo: Inpatient  Code: do not resuscitate with support CODE STATUS.  Use all BiPAP and pressors is allowed outside of cardiac arrest.     History of Presenting Illness                                CC: Bleeding per rectum  [name] [name] is a 88 y.o. male patient with a history of chronic low back pain, dyslipidemia and history of muscle spasms requiring benzodiazepine treatment with Valium presented with above chief complaint.  Patient was in his usual state of health until yesterday mid afternoon at which time after having bowel movement he was sleeping while he felt wetness in his underwear.  He checked it and it was blood.  He changed and sometime later he had an urge to go to the bathroom and he had bloody bowel movement.  He has been wearing adult incontinent briefs and was seen smear of blood in them.  Today for this reason he came to the emergency room for evaluation and management.  Patient has had episodes of lightheadedness and also reports mild lower abdominal cramping.  Denies having fever or chills.  In the emergency room patient was examined investigated.  He had CTA abdomen and pelvis which is reported to show:No definitive site of active GI bleeding.  It showed extensive colonic diverticulosis, particularly throughout the sigmoid  colon. Focal mural hyperenhancement along the dorsal wall of the rectum with underlying dominant serpiginous mesenteric vein, suggesting an underlying vascular abnormality. 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   Patient's initial hemoglobin hematocrit 17.5 and 50.7 respectively.  Patient is getting admitted for further evaluation and management of his bleeding per rectum.  History reviewed. No pertinent past medical history.  Past Surgical History:   Procedure Laterality Date    HUMERUS FRACTURE SURGERY Right 11/05/2019       History reviewed. No pertinent family history.  Social History     Tobacco Use    Smoking status: Never    Smokeless tobacco: Former   Substance Use Topics    Alcohol use: Never            Subjective     Review of Systems:  Review of systems as noted in HPI. Full 12 point ROS otherwise negative.         Objective   Physical Exam:     Vitals: T:97.2 F (36.2 C) (  Temporal),  BP:103/69, HR:85, RR:18, SaO2:94%    1) General Appearance: Alert and oriented x 4. In no acute distress.   2) Eyes: Pink conjunctiva, anicteric sclera. Pupils are equally reactive to light.  3) ENT: Oral mucosa moist with no pharyngeal congestion, erythema or swelling.  4) Neck: Supple, with full range of motion. Trachea is central, no JVD noted  5) Chest: Clear to auscultation bilaterally, no wheezes or rhonchi.  6) CVS: normal rate and regular rhythm, with no gallops.  7) Abdomen: Soft, non-tender, no palpable mass. Bowel sounds normal.   8) Extremities: No pitting edema, pulses palpable, no calf swelling and no gross deformity.  9) Skin: Warm, dry with normal skin turgor, no rash  10) Neurological: Cranial nerves II-XII intact. No gross focal motor or sensory deficits noted.  11) Psychiatric: Affect is appropriate. No hallucinations.      Patient Vitals for the past 12 hrs:   BP Temp Pulse Resp   05/15/21 1702 103/69 -- -- --   05/15/21 1507 124/75 -- -- --   05/15/21 1502 135/62 -- -- --   05/15/21  1250 162/75 97.2 F (36.2 C) 85 18         04/06/2020     1:41 PM 09/07/2020    10:09 AM 09/10/2020    12:51 PM 10/27/2020     2:40 PM 11/11/2020    11:35 AM 05/15/2021    12:50 PM   Weight Monitoring   Height 165.1 cm 165.1 cm    172.7 cm   Height Method      Stated   Weight 88.451 kg 95.255 kg 95.255 kg 95.255 kg 95.255 kg 97.07 kg   Weight Method      Standing Scale   BMI (calculated) 32.5 kg/m2 35 kg/m2    32.6 kg/m2           LABS:  Estimated Creatinine Clearance: 49.8 mL/min (based on SCr of 1.18 mg/dL).   Recent Results (from the past 24 hour(s))   Collect Blood    Collection Time: 05/15/21  1:01 PM   Result Value Ref Range    Collect Blood Label Notification    Basic Metabolic Panel    Collection Time: 05/15/21  1:01 PM   Result Value Ref Range    Sodium 139 136 - 147 mMol/L    Potassium 4.2 3.5 - 5.3 mMol/L    Chloride 98 98 - 110 mMol/L    CO2 34 (H) 20 - 30 mMol/L    Calcium 9.8 8.5 - 10.5 mg/dL    Glucose 99 71 - 99 mg/dL    Creatinine 0.98 1.19 - 1.30 mg/dL    BUN 23 (H) 7 - 22 mg/dL    Anion Gap 14.7 7.0 - 18.0 mMol/L    BUN / Creatinine Ratio 19.5 10.0 - 30.0 Ratio    EGFR 60 60 - 150 mL/min/1.41m2    Osmolality Calculated 281 275 - 300 mOsm/kg   CBC and differential    Collection Time: 05/15/21  1:01 PM   Result Value Ref Range    WBC 11.1 (H) 4.0 - 11.0 K/cmm    RBC 4.66 4.00 - 5.70 M/cmm    Hemoglobin 17.5 13.0 - 17.5 gm/dL    Hematocrit 82.9 56.2 - 52.5 %    MCV 109 (H) 80 - 100 fL    MCH 38 (H) 28 - 35 pg    MCHC 34 32 - 36 gm/dL    RDW 13.0 86.5 - 78.4 %  PLT CT 268 130 - 440 K/cmm    MPV 8.1 6.0 - 10.0 fL    Neutrophils % 67.5 42.0 - 78.0 %    Lymphocytes 17.4 15.0 - 46.0 %    Monocytes 14.0 3.0 - 15.0 %    Eosinophils % 0.7 0.0 - 7.0 %    Basophils % 0.4 0.0 - 3.0 %    Neutrophils Absolute 7.5 1.7 - 8.6 K/cmm    Lymphocytes Absolute 1.9 0.6 - 5.1 K/cmm    Monocytes Absolute 1.6 0.1 - 1.7 K/cmm    Eosinophils Absolute 0.1 0.0 - 0.8 K/cmm    Basophils Absolute 0.0 0.0 - 0.3 K/cmm   Prothrombin time/INR     Collection Time: 05/15/21  1:01 PM   Result Value Ref Range    PT 10.3 9.4 - 11.5 sec    PT INR 1.0 0.9 - 1.1   Type and Screen    Collection Time: 05/15/21  2:21 PM   Result Value Ref Range    ABO Rh O Positive     AB Screen NEGATIVE           Allergies   Allergen Reactions    Morphine And Related Other (See Comments)     hallucinations      CT Angiogram Abdomen Pelvis    Result Date: 05/15/2021  1.  No definitive site of active GI bleeding. 2.  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 3.  Ancillary findings as above. ReadingStation:[email or web address]    Home Medications       Med List Status: In Progress Set By: Gaylyn Cheers A at 05/15/2021  5:17 PM      Status Comment        05/15/2021  5:34 PM     Per med rec tech, patient did not want to review OTC/herbal medications at this time; patient only wanted to review prescription medications.               B Complex-C (B-complex with vitamin C) tablet     Take 1 tablet by mouth daily     Cyanocobalamin 1000 MCG Tab CR     Take 1 tablet by mouth daily     diazePAM (VALIUM) 10 MG tablet     Take 10 mg by mouth every 8 (eight) hours as needed for Other (muscle spasms)     FLUoxetine (PROzac) 10 MG capsule     Take 10 mg by mouth every morning     furosemide (LASIX) 20 MG tablet     Take 20 mg by mouth 3 (three) times daily     melatonin 5 mg tablet     Take 5 mg by mouth nightly     mirtazapine (REMERON) 30 MG tablet     Take 30 mg by mouth nightly     potassium chloride (KLOR-CON M20) 20 MEQ CR tablet     Take 20 mEq by mouth every morning     pravastatin (PRAVACHOL) 40 MG tablet     Take 40 mg by mouth every morning     vitamin D3 (CHOLECALCIFEROL) 125 MCG (5000 UT) capsule     Take 5,000 Units by mouth daily     Zinc Methionate 50 MG Cap     Take 50 mg by mouth daily           Meds given in the ED:  Medications   lactated ringers bolus 1,000  mL (1,000 mLs Intravenous New Bag  05/15/21 1503)   iohexol (OMNIPAQUE) 350 MG/ML injection 100 mL (100 mLs Intravenous Imaging Agent Given 05/15/21 1517)      Time Spent:     Darra Lis, MD     05/15/21,5:42 PM   MRN: [id number]                                      CSN: [id number] DOB: 12-25-1931

## 2021-05-15 NOTE — ED Triage Notes (Signed)
Pt presents to the ED for rectal bleeding reports a mix of bright and dark blood in stool. Denies SOB. Reports last BM today.

## 2021-05-15 NOTE — ED Provider Notes (Signed)
History     Chief Complaint   Patient presents with    Rectal Bleeding     88 year old male presents for evaluation of rectal bleeding.  Multiple episodes of rectal bleeding since yesterday including bleeding with bowel movements and also bleeding in between bowel movements.  Reports some mild lightheadedness as well as some mild lower abdominal cramping.  No fevers or chills.  No rectal pain.  Reports has had a colonoscopy showing diverticulosis in the past and also had an admission to the hospital 10+ years ago for lower GI bleeding that did not require transfusion.  Not on any blood thinning medications.           History reviewed. No pertinent past medical history.    Past Surgical History:   Procedure Laterality Date    HUMERUS FRACTURE SURGERY Right 11/05/2019       History reviewed. No pertinent family history.    Social  Social History     Tobacco Use    Smoking status: Never    Smokeless tobacco: Former   Substance Use Topics    Alcohol use: Never       .     Allergies   Allergen Reactions    Morphine And Related Other (See Comments)     hallucinations       Home Medications               B Complex-C (B-complex with vitamin C) tablet     Take 1 tablet by mouth daily     Cyanocobalamin 1000 MCG Tab CR     Take 1 tablet by mouth daily     diazePAM (VALIUM) 10 MG tablet     Take 10 mg by mouth     FLUoxetine (PROzac) 10 MG capsule     Take 10 mg by mouth daily     furosemide (LASIX) 20 MG tablet     Take 20 mg by mouth 3 (three) times daily     melatonin 5 mg tablet     1 tablet in the evening     MELATONIN PO     Take 1 tablet by mouth     mirtazapine (REMERON) 30 MG tablet     Take 1 tablet by mouth nightly as needed     pravastatin (PRAVACHOL) 40 MG tablet     Take 40 mg by mouth daily     predniSONE (DELTASONE) 10 MG tablet          tiZANidine (ZANAFLEX) 4 MG tablet     Take 4 mg by mouth     Patient not taking: Reported on 04/06/2021     vitamin D3 (CHOLECALCIFEROL) 125 MCG (5000 UT) capsule     Take  5,000 Units by mouth daily     Zinc Methionate 50 MG Cap     Take 50 mg by mouth daily             Review of Systems   Constitutional:  Negative for fever.   Gastrointestinal:  Positive for anal bleeding and blood in stool. Negative for rectal pain and vomiting.   Neurological:  Positive for light-headedness.   All other systems reviewed and are negative.      Physical Exam    BP: 162/75, Heart Rate: 85, Temp: 97.2 F (36.2 C), Resp Rate: 18, SpO2: 95 %, Weight: 97.1 kg    Physical Exam  Vitals and nursing note reviewed.   Constitutional:  General: He is not in acute distress.     Appearance: Normal appearance. He is not toxic-appearing.   HENT:      Mouth/Throat:      Mouth: Mucous membranes are moist.   Eyes:      Conjunctiva/sclera: Conjunctivae normal.   Cardiovascular:      Rate and Rhythm: Normal rate and regular rhythm.      Heart sounds: Normal heart sounds.   Pulmonary:      Effort: Pulmonary effort is normal.   Abdominal:      Palpations: Abdomen is soft.      Tenderness: There is no abdominal tenderness. There is no guarding.   Genitourinary:     Comments: Rectal exam with frank maroon stool with small amount of active bleeding  Skin:     General: Skin is warm and dry.   Neurological:      General: No focal deficit present.      Mental Status: He is alert and oriented to person, place, and time.           MDM and ED Course     ED Medication Orders (From admission, onward)      Start Ordered     Status Ordering Provider    05/15/21 1517 05/15/21 1517  iohexol (OMNIPAQUE) 350 MG/ML injection 100 mL  IMG once as needed        Route: Intravenous  Ordered Dose: 100 mL     Last MAR action: Imaging Agent Given Laney Pastor    05/15/21 1422 05/15/21 1421  lactated ringers bolus 1,000 mL  Once in ED        Route: Intravenous  Ordered Dose: 1,000 mL     Last MAR action: Ozzie Hoyle, [facility] J               Medical Decision Making  Results     Procedure Component Value Units Date/Time    Type and Screen  [[id number]] Collected: 05/15/21 1421    Specimen: Blood Updated: 05/15/21 1550     ABO Rh O Positive     AB Screen NEGATIVE    Prothrombin time/INR [[id number]] Collected: 05/15/21 1301    Specimen: Blood Updated: 05/15/21 1449     PT 10.3 sec      PT INR 1.0    CBC and differential [[id number]]  (Abnormal) Collected: 05/15/21 1301    Specimen: Blood Updated: 05/15/21 1349     WBC 11.1 K/cmm      RBC 4.66 M/cmm      Hemoglobin 17.5 gm/dL      Hematocrit 67.3 %      MCV 109 fL      MCH 38 pg      MCHC 34 gm/dL      RDW 41.9 %      PLT CT 268 K/cmm      MPV 8.1 fL      Neutrophils % 67.5 %      Lymphocytes 17.4 %      Monocytes 14.0 %      Eosinophils % 0.7 %      Basophils % 0.4 %      Neutrophils Absolute 7.5 K/cmm      Lymphocytes Absolute 1.9 K/cmm      Monocytes Absolute 1.6 K/cmm      Eosinophils Absolute 0.1 K/cmm      Basophils Absolute 0.0 K/cmm     Basic Metabolic Panel [[id number]]  (Abnormal) Collected: 05/15/21 1301  Specimen: Plasma Updated: 05/15/21 1337     Sodium 139 mMol/L      Potassium 4.2 mMol/L      Chloride 98 mMol/L      CO2 34 mMol/L      Calcium 9.8 mg/dL      Glucose 99 mg/dL      Creatinine 6.211.18 mg/dL      BUN 23 mg/dL      Anion Gap 30.811.2 mMol/L      BUN / Creatinine Ratio 19.5 Ratio      EGFR 60 mL/min/1.2073m2      Osmolality Calculated 281 mOsm/kg     Collect Blood [[id number]] Collected: 05/15/21 1301    Specimen: Other Updated: 05/15/21 1308     Collect Blood Label Notification      CT Angiogram Abdomen Pelvis    Result Date: 05/15/2021  1.  No definitive site of active GI bleeding. 2.  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 3.  Ancillary findings as above. ReadingStation:[email or web address]    88 year old male presents for evaluation of GI bleed.  Differential diagnosis includes diverticulosis, hemorrhoidal bleeding, vascular abnormality within the intestine, brisk upper GI bleed.  Hemodynamically  stable and afebrile.  Rectal exam with mild amount of active bleeding with no brisk hemorrhage noted.  Labs with stable hemoglobin, appropriate renal function and electrolytes.  CTA with no active extravasation but extensive diverticulosis and possible vascular malformation noted within the large intestine.  Discussed options with patient and family including recheck of hemoglobin in the ED, admission for observation, discharge with close outpatient follow-up.  The patient would be more comfortable with being admitted for overnight observation.  Will admit to the medical service.    Problems Addressed:  Lower GI bleed: acute illness or injury    Amount and/or Complexity of Data Reviewed  Labs: ordered.  Radiology: ordered.    Risk  Prescription drug management.  Decision regarding hospitalization.                     Procedures    Clinical Impression & Disposition     Clinical Impression  Final diagnoses:   Lower GI bleed        ED Disposition       ED Disposition   Admit    Condition   --    Date/Time   Sun May 15, 2021  4:50 PM    Comment   Service: Medicine [[id number]]                  New Prescriptions    No medications on file                   Laney PastorSmith, Karalynn Cottone J, MD  05/15/21 1708

## 2021-05-15 NOTE — ED Notes (Signed)
[facility] EMERGENCY DEPARTMENT  ED NURSING NOTE FOR THE RECEIVING INPATIENT NURSE   ED NURSE PHONE: [phone]    ADMISSION INFORMATION   Adalberto Metzgar is a 88 y.o. male admitted with an ED diagnosis of:  1. Lower GI bleed      ED Pre-Departure Assessment:  Pt presents to the ED for rectal bleeding reports a mix of bright and dark blood in stool. Denies SOB. Reports last BM today. Pt able to verbalize needs.    NURSING CARE   Isolation No active isolations   Current O2 Device None (Room air)     Home O2 No     Patient Comes From: Home Independent   Documents accompanying patient: not applicable   Mental Status: alert and oriented   Ambulation: 1 person assist   Pertinent Info/Safety Concern: None   Report called to receiving RN?: No   ED nurse will obtain a full set of vital signs including temperature and document prior to patient departure

## 2021-05-15 NOTE — Plan of Care (Addendum)
NURSE NOTE SUMMARY  [facility] - GI/ENDO/GEN MED   Patient Name: [name] [name]   Attending Physician: Darra Lis, MD   Today's date:   05/15/2021 LOS: 0 days   Shift Summary:                                                              1610 Assumed care of pt alert and oriented *4, denies pain, pt states no episode of bowel movement, call light and phone at pt reach.    1940 MD in the room, blood transfusion consent was taken copy in the chart.    1950 pt was started on golytely 2000 mls, due meds given as per [facility].   Provider Notifications:        Rapid Response Notifications:  Mobility:      PMP Activity: Step 6 - Walks in Room (05/15/2021  8:30 PM)     Weight tracking:  Family Dynamic:   Last 3 Weights for the past 72 hrs (Last 3 readings):   Weight   05/15/21 1832 98.9 kg (218 lb)   05/15/21 1250 97.1 kg (214 lb)             Last Bowel Movement   Last BM Date: 05/15/21       Problem: Altered GI Function  Goal: Fluid and electrolyte balance are achieved/maintained  Outcome: Progressing  Goal: Elimination patterns are normal or improving  Outcome: Progressing  Flowsheets (Taken 05/15/2021 2249)  Elimination patterns are normal or improving:   Report abnormal assessment to physician   Anticipate/assist with toileting needs   Assess for normal bowel sounds   Monitor for abdominal distension   Monitor for abdominal discomfort   Assess for signs and symptoms of bleeding.  Report signs of bleeding to physician   Administer treatments as ordered   Consult/collaborate with Clinical Nutritionist   Assess for flatus   Assess for and discuss C. diff screening with LIP   Collaborate with LIP for containment device   Reinforce education on foods that improve and complicate bowel movements and how activity and medications can affect bowel movements   Administer medications to improve bowel evacuation as prescribed   Encourage /perform oral hygiene as appropriate  Goal: Nutritional intake is  adequate  Outcome: Progressing  Goal: Mobility/Activity is maintained at optimal level for patient  Outcome: Progressing  Goal: No bleeding  Outcome: Progressing  Flowsheets (Taken 05/15/2021 2249)  No bleeding:   Monitor and assess vitals and hemodynamic parameters   Monitor/assess lab values and report abnormal values   Assess for bruising/petechia

## 2021-05-15 NOTE — Progress Notes (Signed)
NURSE NOTE SUMMARY  [facility] - GI/ENDO/GEN MED   Patient Name: [name] [name],[name] [name]   Attending Physician: Darra Lis, MD   Today's date:   05/15/2021 LOS: 0 days   Shift Summary:                                                              0254: Assumed care of patient from the ED. Patient oriented to the unit. Tele applied. Patient requesting to not wear a gown. Patient stable on his feet, but educated to call if he gets dizzy. Skin assessment preformed with no pressure wounds, but scrapes scattered. Plan of care discussed about monitoring patient overnight. Patient requesting a sandwich & ginger ale. No other needs at this time.     Provider Notifications:        Rapid Response Notifications:  Mobility:          Weight tracking:  Family Dynamic:   Last 3 Weights for the past 72 hrs (Last 3 readings):   Weight   05/15/21 1250 97.1 kg (214 lb)             Last Bowel Movement   No data recorded    4 eyes in 4 hours pressure injury assessment note:      RN or CNA Completed with: Edis R. CNA           Bony Prominences: Check appropriate box below     If wound is present add wound to LDA avatar      Occiput:   [x]  WNL   []  Wound present    Face:                     [x]  WNL    []  Wound present    Ears:      [x]  WNL   []  Wound present    Spine:    [x]  WNL   []  Wound present    Shoulders:    [x]  WNL    []  Wound present    Elbows:    [x]  WNL    []  Wound present    Sacrum/coccyx:   [x]  WNL    []  Wound present    Ischial Tuberosity:  [x]  WNL  []  Wound present    Trochanter/Hip:       [x]  WNL  []  Wound present    Knees:     [x]  WNL  []  Wound present    Ankles:     [x]  WNL  []  Wound present    Heels:    [x]  WNL  []  Wound present      Other pressure areas:      Wound Location:      Device related: []  Device:       Consult WOCN for guidance & staging of pressure injuries.

## 2021-05-16 ENCOUNTER — Inpatient Hospital Stay (HOSPITAL_BASED_OUTPATIENT_CLINIC_OR_DEPARTMENT_OTHER): Payer: Medicare Other | Admitting: Anesthesiology

## 2021-05-16 ENCOUNTER — Encounter (HOSPITAL_BASED_OUTPATIENT_CLINIC_OR_DEPARTMENT_OTHER): Payer: Self-pay | Admitting: Internal Medicine

## 2021-05-16 ENCOUNTER — Encounter
Admission: EM | Disposition: A | Discharge status: Home / Self Care | Payer: Self-pay | Source: Home / Self Care | Attending: Internal Medicine

## 2021-05-16 HISTORY — PX: COLONOSCOPY: SHX174

## 2021-05-16 LAB — COMPREHENSIVE METABOLIC PANEL
ALT: 83 U/L — ABNORMAL HIGH (ref 0–55)
AST (SGOT): 80 U/L — ABNORMAL HIGH (ref 10–42)
Albumin/Globulin Ratio: 1.16 Ratio (ref 0.80–2.00)
Albumin: 2.9 gm/dL — ABNORMAL LOW (ref 3.5–5.0)
Alkaline Phosphatase: 118 U/L (ref 40–145)
Anion Gap: 16 mMol/L (ref 7.0–18.0)
BUN / Creatinine Ratio: 25 Ratio (ref 10.0–30.0)
BUN: 21 mg/dL (ref 7–22)
Bilirubin, Total: 0.9 mg/dL (ref 0.1–1.2)
CO2: 29 mMol/L (ref 20–30)
Calcium: 8.5 mg/dL (ref 8.5–10.5)
Chloride: 100 mMol/L (ref 98–110)
Creatinine: 0.84 mg/dL (ref 0.80–1.30)
EGFR: 84 mL/min/{1.73_m2} (ref 60–150)
Globulin: 2.5 gm/dL (ref 2.0–4.0)
Glucose: 102 mg/dL — ABNORMAL HIGH (ref 71–99)
Osmolality Calculated: 284 mOsm/kg (ref 275–300)
Potassium: 4 mMol/L (ref 3.5–5.3)
Protein, Total: 5.4 gm/dL — ABNORMAL LOW (ref 6.0–8.3)
Sodium: 141 mMol/L (ref 136–147)

## 2021-05-16 LAB — CBC AND DIFFERENTIAL
Basophils %: 0.6 % (ref 0.0–3.0)
Basophils Absolute: 0 10*3/uL (ref 0.0–0.3)
Eosinophils %: 0.7 % (ref 0.0–7.0)
Eosinophils Absolute: 0.1 10*3/uL (ref 0.0–0.8)
Hematocrit: 42.1 % (ref 39.0–52.5)
Hemoglobin: 14 gm/dL (ref 13.0–17.5)
Lymphocytes Absolute: 1.8 10*3/uL (ref 0.6–5.1)
Lymphocytes: 22.5 % (ref 15.0–46.0)
MCH: 37 pg — ABNORMAL HIGH (ref 28–35)
MCHC: 33 gm/dL (ref 32–36)
MCV: 110 fL — ABNORMAL HIGH (ref 80–100)
MPV: 8.3 fL (ref 6.0–10.0)
Monocytes Absolute: 1.3 10*3/uL (ref 0.1–1.7)
Monocytes: 16.2 % — ABNORMAL HIGH (ref 3.0–15.0)
Neutrophils %: 60.1 % (ref 42.0–78.0)
Neutrophils Absolute: 4.7 10*3/uL (ref 1.7–8.6)
PLT CT: 206 10*3/uL (ref 130–440)
RBC: 3.84 10*6/uL — ABNORMAL LOW (ref 4.00–5.70)
RDW: 12 % (ref 11.0–14.0)
WBC: 7.9 10*3/uL (ref 4.0–11.0)

## 2021-05-16 LAB — HEMOGLOBIN AND HEMATOCRIT, BLOOD
Hematocrit: 39.2 % (ref 39.0–52.5)
Hematocrit: 45.9 % (ref 39.0–52.5)
Hemoglobin: 13.5 gm/dL (ref 13.0–17.5)
Hemoglobin: 15.7 gm/dL (ref 13.0–17.5)

## 2021-05-16 LAB — MAGNESIUM: Magnesium: 2.2 mg/dL (ref 1.6–2.6)

## 2021-05-16 SURGERY — DONT USE, USE 1094-COLONOSCOPY, DIAGNOSTIC (SCREENING)
Anesthesia: Anesthesia MAC / Sedation | Site: Anus | Wound class: Clean Contaminated

## 2021-05-16 MED ORDER — SODIUM CHLORIDE 0.9 % IV SOLN
INTRAVENOUS | Status: DC | PRN
Start: 2021-05-16 — End: 2021-05-16

## 2021-05-16 MED ORDER — FLEET ENEMA 7-19 GM/118ML RE ENEM
1.0000 | ENEMA | Freq: Once | RECTAL | Status: AC
Start: 2021-05-16 — End: 2021-05-16
  Administered 2021-05-16: 1 via RECTAL

## 2021-05-16 MED ORDER — PROPOFOL 200 MG/20ML IV EMUL
INTRAVENOUS | Status: AC
Start: 2021-05-16 — End: ?
  Filled 2021-05-16: qty 20

## 2021-05-16 MED ORDER — PROPOFOL 200 MG/20ML IV EMUL
INTRAVENOUS | Status: DC | PRN
Start: 2021-05-16 — End: 2021-05-16
  Administered 2021-05-16: 20 mg via INTRAVENOUS
  Administered 2021-05-16: 80 mg via INTRAVENOUS
  Administered 2021-05-16 (×5): 20 mg via INTRAVENOUS

## 2021-05-16 SURGICAL SUPPLY — 35 items
AGENT SUBMUCOSAL INJECTION (Supply) IMPLANT
BRUSH HEDGEHOG DUAL END (Supply) ×2 IMPLANT
CATH GLD PROBE BICAP 7FX210CM (Supply) IMPLANT
CATH GLD PROBE BICAP 7FX300CM (Supply) IMPLANT
CATH GOLD PROBE 10FR (Supply) IMPLANT
CATH GOLD PROBE INJECTION 10F (Supply) IMPLANT
CLEANER ENZYMATIC BEDSIDE (Supply) ×2 IMPLANT
CLIP LIGATING RES 360 ULT 17MM (Supply) IMPLANT
CLIP RESOLUTION 360 (Supply) IMPLANT
CONNECTOR QUICK PORT (Supply) ×2 IMPLANT
CRE PYLORIC COL 10-12 DIL 5847 (Supply) IMPLANT
CRE PYLORIC COL 12-15 DIL 5848 (Supply) IMPLANT
CRE PYLORIC COL 15-18 DIL 5849 (Supply) IMPLANT
CRE PYLORIC COL 8-10 DIL 5846 (Supply) IMPLANT
DEVICE STERIFLATE INFLATION (Supply) IMPLANT
DIL CRE 18-20 PYL CLN 5850 (Supply) IMPLANT
ENDOCUFF VISION LG GREEN 11.2 (Supply) IMPLANT
FORCEP BIOPSY HOT RAD JAW 4 (Supply) IMPLANT
FORCEP BIOPSY RAD JAW 1333-40 (Supply) IMPLANT
FORCEP RADIAL JAW JUMBO 240CM (Supply) IMPLANT
HEMOSTAT ENDO HEMOSPRAY 5G (Supply) IMPLANT
HEMOSTAT ENDO HEMOSPRAY 7FR (Supply)
MARKER ENDOSCOPIC SPOT (Supply) IMPLANT
NDL INTERJECT SCLERO 25G (Supply) IMPLANT
PAD CLINCH ENDO TRASPORT (Supply) ×2 IMPLANT
PROBE CIRCUMFRENTIAL (Supply) IMPLANT
PROBE SIDE FIRE (Supply) IMPLANT
PROBE STRAIGHT FIRE APC (Supply) IMPLANT
SNARE ENDO CAP RND 10MM COLD (Supply) IMPLANT
SNARE ENDO CAP2 RND 10MM LOOP (Supply) IMPLANT
SNARE LARGE CAPTIV OVAL 6131 (Supply) IMPLANT
SNARE ROTATE SM OVAL MED STFF (Supply) IMPLANT
SNARE SMALL CAPTIV 6230 (Supply) IMPLANT
VALVE DISP CLEANING BIOGUARD (Supply) ×2 IMPLANT
VALVE OLYMPUS DISP A/W/S/ BIO (Supply) ×2 IMPLANT

## 2021-05-16 NOTE — Progress Notes (Signed)
Readmission Risk  [facility] - GI/ENDO/GEN MED   Patient Name: [name],[name]   Attending Physician: Darra Lis, MD   Today's date:   05/16/2021 LOS: 1 days   Expected Discharge Date      Readmission Assessment:                                                              Discharge Planning  ReAdmit Risk Score: 7.1  Does the patient have perscription coverage?: Yes  Utilize [place] [facility]: No  Confirmed PCP with Pt: Yes  Confirmed PCP name: [name] [name]  Last PCP Visit Date: Unsure- Has a visit next week  CM Comments: 05/16/2021 RNCM (KQ): Admitted for bleeding per rectum. GI consulted. Colonoscopy today. Pt independent at home and lives with his daughter. Daughter drives him to his appointments. Plan for discharge is home with daughter. Further needs/dispo to be determined pending clinical course/progression. CM following.       IDPA:   Patient Type  Within 30 Days of Previous Admission?: No  Healthcare Decisions  Interviewed:: Patient  Orientation/Decision Making Abilities of Patient: Alert and Oriented x3, able to make decisions  Advance Directive: Patient has advance directive, copy not in chart  Healthcare Agent Appointed: No  Prior to admission  Prior level of function: Independent with ADLs  Type of Residence: Private residence  Home Layout: Multi-level, Stairs to enter with rails (add number in comment) (1)  Have running water, electricity, heat, etc?: Yes  Living Arrangements: Children  How do you get to your MD appointments?: Daughter drives  How do you get your groceries?: Daughter drives  Who fixes your meals?: Daughter  Who does your laundry?: Daughter  Who picks up your prescriptions?: Daughter  Dressing: Independent  Grooming: Independent  Feeding: Independent  Bathing: Independent  Toileting: Independent  Discharge Planning  Support Systems: Children  Patient expects to be discharged to:: Home  Anticipated [place] plan discussed with:: Same as interviewed  Mode of transportation::  Private car (family member)  Does the patient have perscription coverage?: Yes  Consults/Providers  PT Evaluation Needed: No  OT Evalulation Needed: No  SLP Evaluation Needed: No  Correct PCP listed in Epic?: Yes  Family and PCP  PCP on file was verified as the current PCP?: Yes   30 Day Readmission:       Provider Notifications:           Wende Bushy RN, BSN  Case Manager

## 2021-05-16 NOTE — UM Notes (Signed)
[facility] Utilization Management Review Sheet    Facility :  [facility]    NAME: [name] [name]  MR#: [id number]    CSN#: [id number]    ROOM: 519/519-A AGE: 88 y.o.    Date of Birth: 10-12-1931    ADMIT DATE AND TIME: 05/15/2021  1:44 PM      PATIENT CLASS: Inpatient 05/15/2021 @ 1742     ATTENDING PHYSICIAN: Darra Lis, MD  PAYOR:Payor: MEDICARE / Plan: MEDICARE PART A AND B / Product Type: Medicare /       AUTH #:     DIAGNOSIS:     ICD-10-CM    1. Lower GI bleed  K92.2           HISTORY: History reviewed. No pertinent past medical history.      Patient presents to the ED with bleeding per rectum.  After having a bowel movement patient was sleeping and felt wetness in his underwear. He checked and it was blood. He changed and sometimes later he had an urge to go to the bathroom and he had a bloody bowel movement. He has been wearing adult incontinent briefs and was seeing smears of blood in them. Patient also states that he has had episodes of lightheadedness and mild lower abdominal cramping.    Assessment and Plan:                                                                 Bleeding per rectum: Patient getting admitted to medical floor.  GI consultation requested.  Patient will be on clear liquids.  Ordered for colonoscopy prep with GoLytely.  Patient will have H&H every 6 hours.     Dyslipidemia: Continue pravastatin per home dose.     Muscle spasms: Continue Valium as needed for muscle spasms per home dose.     DVT PPx: Contraindicated due to GI bleeding.  Dispo: Inpatient  Code: do not resuscitate with support CODE STATUS.  Use all BiPAP and pressors is allowed outside of cardiac arrest.      LABS: WBC 11.1 BUN 23 CO2 34 aPTT 23.0     CT Angiogram Abdomen Pelvis IMPRESSION:   1.  No definitive site of active GI bleeding.  2.  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  3.  Ancillary findings as  above.    VITALS: T97.2 P85 R18 BP162/75 Sat 95%    ED TREATMENT: LR IV bolus     MEDICATIONS: GOLYTELY 2L PO x 1     Scheduled Meds:  Current Facility-Administered Medications   Medication Dose Route Frequency    FLUoxetine  10 mg Oral QAM    [START ON 05/17/2021] furosemide  20 mg Oral TID    melatonin  4.5 mg Oral QHS    mirtazapine  30 mg Oral QHS    pantoprazole  40 mg Intravenous Daily    [START ON 05/17/2021] potassium chloride  20 mEq Oral QAM    pravastatin  40 mg Oral QAM    sodium chloride (PF)  3 mL Intravenous Q12H SCH    vitamin B-12  1,000 mcg Oral Daily     Continuous Infusions:  PRN Meds:.acetaminophen **OR** acetaminophen **OR** acetaminophen, diazePAM,  naloxone (NARCAN) injection 0.4 mg, ondansetron **OR** ondansetron    Bronsyn Shappell L. Danella Penton RN BSN  [facility]   Virtual Utilization Review Specialist  For [facility] and [facility]    Office: [phone] Fax: [phone]

## 2021-05-16 NOTE — Transfer of Care (Signed)
Anesthesia Transfer of Care Note    Patient: [name] [name]    Last vitals:   Vitals:    05/16/21 1432   BP: 149/61   Pulse: 64   Resp: 12   Temp:    SpO2: 98%       Oxygen: Nasal Cannula     Mental Status:sedated    Airway: Natural    Cardiovascular Status:  stable

## 2021-05-16 NOTE — Discharge Summary (Incomplete)
Medicine Discharge Summary   [facility]  [place], [place]   Patient Name: [name],[name]   Attending Physician: Darra Lis, MD PCP: Ellender Hose, MD   Date of Admission: 05/15/2021 D/C Date: 05/16/2021   Discharge Diagnoses:     [facility] Course       [name] [name] is a 88 y.o. male patient that was admitted on 05/15/2021 ***    {[facility] Hosp ZOXW:960454098}   Pending Results and other significant studies:  {VH Pending Results:510404007}     Discharge Instructions:          Disposition: {VH Disposition:510404009}  Diet: {JXBJ:47829}  Activity: {Discharge Activity:26946:a}  Discharge Code Status: NO CPR - SUPPORT OK    Ellender Hose, MD  [address]  [place] [address]  [phone]    Follow up in 2 day(s)         Discharge Medications:                                                                        Discharge Medication List        Taking      B-complex with vitamin C tablet  Dose: 1 tablet  Take 1 tablet by mouth daily     Cyanocobalamin 1000 MCG Tbcr  Dose: 1 tablet  Take 1 tablet by mouth daily     diazePAM 10 MG tablet  Dose: 10 mg  Commonly known as: VALIUM  Take 10 mg by mouth every 8 (eight) hours as needed for Other (muscle spasms)     FLUoxetine 10 MG capsule  Dose: 10 mg  Commonly known as: PROzac  Take 10 mg by mouth every morning     furosemide 20 MG tablet  Dose: 20 mg  Commonly known as: LASIX  Take 20 mg by mouth 3 (three) times daily     melatonin 5 mg tablet  Dose: 5 mg  Take 5 mg by mouth nightly     mirtazapine 30 MG tablet  Dose: 30 mg  Commonly known as: REMERON  Take 30 mg by mouth nightly     potassium chloride 20 MEQ CR tablet  Dose: 20 mEq  Commonly known as: KLOR-CON M20  Take 20 mEq by mouth every morning     pravastatin 40 MG tablet  Dose: 40 mg  Commonly known as: PRAVACHOL  Take 40 mg by mouth every morning     vitamin D3 125 MCG (5000 UT) capsule  Dose: 5,000 Units  Commonly known as: CHOLECALCIFEROL  Take 5,000 Units by mouth daily      Zinc Methionate 50 MG Caps  Dose: 50 mg  Take 50 mg by mouth daily               Discharge Day Exam (05/16/2021):     Blood pressure 167/72, pulse 68, temperature 98.1 F (36.7 C), temperature source Temporal, resp. rate 20, height 1.727 m (5\' 8" ), weight 98.9 kg (218 lb), SpO2 92 %.           ***  General: Patient is awake. In no acute distress.  Chest: CTA bilaterally. No rhonchi, no wheezing. No use of accessory muscles.  CVS: Normal rate  and regular rhythm no murmurs, without JVD.  Abdomen: Soft, non-tender, no guarding or rigidity, with normal bowel sounds.  Extremities: No pitting edema, pulses palpable, no calf swelling and no gross deformity.  Skin: Warm, dry  NEURO: No motor or sensory deficits.     Recent Labs      Recent Labs   Lab 05/16/21  1254 05/16/21  1056 05/16/21  0459 05/15/21  1950 05/15/21  1301   WBC  --   --  7.9  --  11.1*   RBC  --   --  3.84*  --  4.66   Hemoglobin 15.7 13.5 14.0 16.6 17.5   Hematocrit 45.9 39.2 42.1 48.6 50.7   MCV  --   --  110*  --  109*   PLT CT  --   --  206  --  268     Recent Labs   Lab 05/15/21  1950 05/15/21  1301   PT 10.5 10.3   PT INR 1.0 1.0   aPTT 23.0*  --          No results found for: HGBA1CPERCNT  Recent Labs   Lab 05/16/21  0459 05/15/21  1301   Glucose 102* 99   Sodium 141 139   Potassium 4.0 4.2   Chloride 100 98   CO2 29 34*   BUN 21 23*   Creatinine 0.84 1.18   EGFR 84 60   Calcium 8.5 9.8     Recent Labs   Lab 05/16/21  0459   Magnesium 2.2   Albumin 2.9*   Protein, Total 5.4*   Bilirubin, Total 0.9   Alkaline Phosphatase 118   ALT 83*   AST (SGOT) 80*        Allergies:      Morphine and related   Time spent on discharging the patient:  *** minutes   CT Angiogram Abdomen Pelvis    Result Date: 05/15/2021  1.  No definitive site of active GI bleeding. 2.  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 3.  Ancillary findings as above. ReadingStation:[email or web address]     Home Health Needs:  There are no questions and answers to display.      Darra Lis, MD         05/16/21 4:33 PM   MRN: [id number]                                      CSN: [id number] DOB: Jan 28, 1932

## 2021-05-16 NOTE — Consults (Signed)
GASTROENTEROLOGY CONSULTATION  [facility]    Date Time: 05/16/21 8:16 AM  Patient Name: Stoklosa,Daivion  Requesting Physician: Darra Lis, MD    PCP: Ellender Hose, MD    Reason for Consultation / Chief Complaint:   We are asked to see this patient for GI BLEED    Assessment:   Principal Problem:    Bleeding per rectum  - Hgb 17.5->14.0  - CTA abd/pelvis: No definitive site of active GI bleeding.  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  - Colonoscopy in 2015 for evaluation of rectal bleeding:  Severe diverticulosis in the sigmoid colon.  Moderate diverticulosis in descending and transverse colon.    Plan:   Colonoscopy today with Dr. Kizzie Ide.   - NPO; sips with meds okay   - Golytley split prep: completed   - hold anticoagulation/antiplatelet therapy   - transfuse to maintain Hgb > 7   - IV PPI BID     Please see attending's addendum    History:   Kenyada Hy is a 88 y.o. male who presents to the hospital on 05/15/2021 with history of dyslipidemia and muscle spasms whom I have been consulted on for GI bleeding.    Patient presented to the ED with bright red blood per rectum.  Reports that two days ago he suddenly developed BRBPR. First noted in his underwear and then he began passing large amounts of blood. Denies any associated symptoms including fever, nausea, vomiting, abdominal pain or unintentional weight loss. Currently on asa 81 mg daily. Prior hx of GI bleeding approximately 10 years ago. He does not recall what the source.    Upon arrival, hemoglobin was stable at 17.5 with hematocrit 50.  Labs are notable for WBC 11.1, BUN 23, AST 80, ALT 83 and INR 1.0.  CTA abdomen/pelvis with no definitive site of GI bleeding.  Extensive colonic diverticulosis.    Last colonoscopy was in 2015 for evaluation of rectal bleeding.  Severe diverticulosis in the sigmoid colon.  Moderate diverticulosis in descending and  transverse colon.    Past Medical History:   History reviewed. No pertinent past medical history.      Past Surgical History:     Past Surgical History:   Procedure Laterality Date    HUMERUS FRACTURE SURGERY Right 11/05/2019       Family History:   History reviewed. No pertinent family history.    Social History:     Social History     Socioeconomic History    Marital status: Widowed     Spouse name: Not on file    Number of children: Not on file    Years of education: Not on file    Highest education level: Not on file   Occupational History    Not on file   Tobacco Use    Smoking status: Never    Smokeless tobacco: Former   Advertising account planner    Vaping status: Not on file   Substance and Sexual Activity    Alcohol use: Never    Drug use: Not on file    Sexual activity: Not on file   Other Topics Concern    Not on file   Social History Narrative    Not on file     Social Determinants of Health     Financial Resource Strain: Not on file   Food Insecurity: Not on file   Transportation Needs: Not on file   Physical  Activity: Not on file   Stress: Not on file   Social Connections: Not on file   Intimate Partner Violence: Not on file   Housing Stability: Not on file       Allergies:     Allergies   Allergen Reactions    Morphine And Related Other (See Comments)     hallucinations       Medications:     Current Facility-Administered Medications   Medication Dose Route Frequency    FLUoxetine  10 mg Oral QAM    [START ON 05/17/2021] furosemide  20 mg Oral TID    melatonin  4.5 mg Oral QHS    mirtazapine  30 mg Oral QHS    pantoprazole  40 mg Intravenous Daily    [START ON 05/17/2021] potassium chloride  20 mEq Oral QAM    pravastatin  40 mg Oral QAM    sodium chloride (PF)  3 mL Intravenous Q12H SCH    vitamin B-12  1,000 mcg Oral Daily         Review of Systems:   See HPI.     ROS as above otherwise negative    Physical Exam:   Blood pressure 164/83, pulse 75, temperature 99.7 F (37.6 C), temperature source Temporal, resp. rate  16, height 1.727 m (5\' 8" ), weight 98.9 kg (218 lb), SpO2 93 %.    General appearance - alert, oriented to person, place, time and event. NAD  Neuro - No gross neurologic deficits, moves all extremities normally in bed  Eyes - pupils equal and reactive, extraocular eye movements intact, sclera anicteric  Ears - hearing grossly normal bilaterally  Mouth - mucous membranes dry  Chest - clear to auscultation anteriorly, no wheezes, rales or rhonchi, symmetric air entry, normal effort. Non-tender to palpation  Heart - normal rate, regular rhythm, normal S1, S2, no murmurs. no rubs, clicks or gallops  Abdomen - soft, non-tender, nondistended. No obvious pulsation or mass. No abdominal bruits.  Extremities - no pedal edema noted  Rectal exam- deferred  Skin - warm and dry.        Lab:     Results       Procedure Component Value Units Date/Time    CBC and differential [[id number]]  (Abnormal) Collected: 05/16/21 0459    Specimen: Blood Updated: 05/16/21 0741     WBC 7.9 K/cmm      RBC 3.84 M/cmm      Hemoglobin 14.0 gm/dL      Hematocrit 11.9 %      MCV 110 fL      MCH 37 pg      MCHC 33 gm/dL      RDW 14.7 %      PLT CT 206 K/cmm      MPV 8.3 fL      Neutrophils % 60.1 %      Lymphocytes 22.5 %      Monocytes 16.2 %      Eosinophils % 0.7 %      Basophils % 0.6 %      Neutrophils Absolute 4.7 K/cmm      Lymphocytes Absolute 1.8 K/cmm      Monocytes Absolute 1.3 K/cmm      Eosinophils Absolute 0.1 K/cmm      Basophils Absolute 0.0 K/cmm     Comprehensive metabolic panel [[id number]]  (Abnormal) Collected: 05/16/21 0459    Specimen: Plasma Updated: 05/16/21 0659     Sodium 141 mMol/L  Potassium 4.0 mMol/L      Chloride 100 mMol/L      CO2 29 mMol/L      Calcium 8.5 mg/dL      Glucose 161 mg/dL      Creatinine 0.96 mg/dL      BUN 21 mg/dL      Protein, Total 5.4 gm/dL      Albumin 2.9 gm/dL      Alkaline Phosphatase 118 U/L      ALT 83 U/L      AST (SGOT) 80 U/L      Bilirubin, Total 0.9 mg/dL      Albumin/Globulin Ratio  1.16 Ratio      Anion Gap 16.0 mMol/L      BUN / Creatinine Ratio 25.0 Ratio      EGFR 84 mL/min/1.57m2      Osmolality Calculated 284 mOsm/kg      Globulin 2.5 gm/dL     Magnesium [[id number]] Collected: 05/16/21 0459    Specimen: Plasma Updated: 05/16/21 0659     Magnesium 2.2 mg/dL     PT/APTT [[id number]]  (Abnormal) Collected: 05/15/21 1950    Specimen: Blood Updated: 05/15/21 2024     PT 10.5 sec      PT INR 1.0     aPTT 23.0 sec     Hemoglobin and hematocrit, blood [[id number]] Collected: 05/15/21 1950    Specimen: Blood Updated: 05/15/21 2017     Hemoglobin 16.6 gm/dL      Hematocrit 46.9 %     Type and Screen [[id number]] Collected: 05/15/21 1421    Specimen: Blood Updated: 05/15/21 1550     ABO Rh O Positive     AB Screen NEGATIVE    Prothrombin time/INR [[id number]] Collected: 05/15/21 1301    Specimen: Blood Updated: 05/15/21 1449     PT 10.3 sec      PT INR 1.0    CBC and differential [[id number]]  (Abnormal) Collected: 05/15/21 1301    Specimen: Blood Updated: 05/15/21 1349     WBC 11.1 K/cmm      RBC 4.66 M/cmm      Hemoglobin 17.5 gm/dL      Hematocrit 74.2 %      MCV 109 fL      MCH 38 pg      MCHC 34 gm/dL      RDW 59.5 %      PLT CT 268 K/cmm      MPV 8.1 fL      Neutrophils % 67.5 %      Lymphocytes 17.4 %      Monocytes 14.0 %      Eosinophils % 0.7 %      Basophils % 0.4 %      Neutrophils Absolute 7.5 K/cmm      Lymphocytes Absolute 1.9 K/cmm      Monocytes Absolute 1.6 K/cmm      Eosinophils Absolute 0.1 K/cmm      Basophils Absolute 0.0 K/cmm     Basic Metabolic Panel [[id number]]  (Abnormal) Collected: 05/15/21 1301    Specimen: Plasma Updated: 05/15/21 1337     Sodium 139 mMol/L      Potassium 4.2 mMol/L      Chloride 98 mMol/L      CO2 34 mMol/L      Calcium 9.8 mg/dL      Glucose 99 mg/dL      Creatinine 2.95 mg/dL      BUN 23 mg/dL  Anion Gap 11.2 mMol/L      BUN / Creatinine Ratio 19.5 Ratio      EGFR 60 mL/min/1.58m2      Osmolality Calculated 281 mOsm/kg     Collect Blood [[id number]]  Collected: 05/15/21 1301    Specimen: Other Updated: 05/15/21 1308     Collect Blood Label Notification          Labs Reviewed.     Radiology:     Radiology Results (24 Hour)       Procedure Component Value Units Date/Time    CT Angiogram Abdomen Pelvis [[id number]] Collected: 05/15/21 1526    Order Status: Completed Updated: 05/15/21 1537    Narrative:      Clinical History:  GI bleed    Examination:  CT angiography of the abdomen and pelvis performed before and after IV contrast administration. Coronal and sagittal maximum intensity projection reconstructions performed at an independent 3-D workstation to confirm axial findings.    CT images were acquired utilizing Automated Exposure Control for dose reduction. All CT scans are performed using dose optimization techniques as appropriate to the performed exam, including automated exposure control (AEC) and iterative reconstruction.     Contrast:  IOHEXOL 350 MG/ML IV SOLN/100 mL    Comparison:  None available.    Findings:    Indication specific: No definitive focal site of arterial extravasation to account for active GI bleed. There is extensive colonic diverticulosis, particularly throughout the sigmoid colon. Focal mural hyperenhancement along the dorsal wall of the rectum   (series 4 image 182) with underlying dominant serpiginous mesenteric vein, suggesting an underlying vascular abnormality. 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    Aorta: Moderate diffuse atherosclerosis. No aneurysm.    Celiac trunk: Normal.    SMA: Mild atherosclerosis without flow-limiting stenosis.    IMA: Patent. At least mild ostial stenosis.    Renal arteries: Mild atherosclerosis without flow-limiting stenosis.    Iliac arteries: Moderate atherosclerosis without flow-limiting stenosis.    Other: Prominent subpleural adipose along the lower lungs. Hepatic steatosis with granulomas. Extensive calcified granulomas. Submucosal fat deposition along the stomach wall,  suggesting prior inflammation. Bilateral renal atrophy. Simple appearing right   renal cyst, no follow-up required. Mild pancreatic atrophy. Small duodenal diverticulum. Small fat-containing supraumbilical hernia. Lumbar dextrocurvature. Multiarticular regional degenerative changes.      Impression:      1.  No definitive site of active GI bleeding.  2.  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  3.  Ancillary findings as above.    ReadingStation:[email or web address]          Radiological Procedure within last 24 hours reviewed.    Signed by: Hyman Hopes, PA

## 2021-05-16 NOTE — Anesthesia Preprocedure Evaluation (Signed)
Anesthesia Evaluation    AIRWAY    Mallampati: II    TM distance: <3 FB  Neck ROM: full  Mouth Opening:full   CARDIOVASCULAR    cardiovascular exam normal       DENTAL    no notable dental hx               PULMONARY    pulmonary exam normal     OTHER FINDINGS                                      Relevant Problems   No relevant active problems               Anesthesia Plan    ASA 3     MAC               (No chest pain or SOB.   NPO except clears >2 hrs ago.  Snores, no witnessed apnea no h/o OSA.    DNR on hold for procedure and recovery.     Risks discussed including but not limited to nerve damage, dental injury, cardiac and pulmonary complications. Questions answered. Pt accepts consent obtained.  )                  informed consent obtained                   Signed by: Morrie Sheldon, MD 05/16/21 2:06 PM

## 2021-05-16 NOTE — Plan of Care (Addendum)
NURSE NOTE SUMMARY  [facility] - GI/ENDO/GEN MED   Patient Name: [name] [name],[name] [name]   Attending Physician: Darra Lis, MD   Today's date:   05/16/2021 LOS: 1 days   Shift Summary:                                                              0700:  Assumed care of pt.  Pt resting in bed at this time with no needs or issues stated.  Call bell in reach.   Will continue to monitor.   1610 Meds given per [facility]  , pt NPO for colonoscopy  plan today  1015 pt noted asleep at this time   1230 pt given fleet enema as requested  0115 Pt taken down for colonoscopy by transport  1533 pt back to floor at this time  1650 iv taken out ,discharge papers instruction given ,pt verbalize understanding ,pt getting ready for discharge   1710 pt got dischage with transport   Provider Notifications:      Rapid Response Notifications:  Mobility:      PMP Activity: Step 6 - Walks in Room (05/16/2021 11:01 AM)     Weight tracking:  Family Dynamic:   Last 3 Weights for the past 72 hrs (Last 3 readings):   Weight   05/15/21 1832 98.9 kg (218 lb)   05/15/21 1250 97.1 kg (214 lb)             Recent Vitals Last Bowel Movement   BP: 144/65 (05/16/2021 11:49 AM)  Heart Rate: 68 (05/16/2021 11:49 AM)  Temp: 98.2 F (36.8 C) (05/16/2021 11:49 AM)  Resp Rate: 16 (05/16/2021 11:49 AM)  Height: 1.727 m (5\' 8" ) (05/15/2021  6:32 PM)  Weight: 98.9 kg (218 lb) (05/15/2021  6:32 PM)  SpO2: (!) 89 % (05/16/2021 11:49 AM)   Last BM Date: 05/15/21       Problem: Altered GI Function  Goal: Fluid and electrolyte balance are achieved/maintained  Outcome: Progressing  Flowsheets (Taken 05/16/2021 0103 by Theresia Lo, RN)  Fluid and electrolyte balance are achieved/maintained:   Monitor intake and output every shift   Monitor/assess lab values and report abnormal values   Observe for cardiac arrhythmias  Goal: Elimination patterns are normal or improving  Outcome: Progressing  Flowsheets (Taken 05/15/2021 2249 by Stevenson Clinch, RN)  Elimination patterns  are normal or improving:   Report abnormal assessment to physician   Anticipate/assist with toileting needs   Assess for normal bowel sounds   Monitor for abdominal distension   Monitor for abdominal discomfort   Assess for signs and symptoms of bleeding.  Report signs of bleeding to physician   Administer treatments as ordered   Consult/collaborate with Clinical Nutritionist   Assess for flatus   Assess for and discuss C. diff screening with LIP   Collaborate with LIP for containment device   Reinforce education on foods that improve and complicate bowel movements and how activity and medications can affect bowel movements   Administer medications to improve bowel evacuation as prescribed   Encourage /perform oral hygiene as appropriate  Goal: Nutritional intake is adequate  Outcome: Progressing  Flowsheets (Taken 05/16/2021 1155)  Nutritional intake is adequate:   Assist patient with meals/food selection   Allow adequate time  for meals   Encourage/perform oral hygiene as appropriate  Goal: Mobility/Activity is maintained at optimal level for patient  Outcome: Progressing  Flowsheets (Taken 05/16/2021 0103 by Theresia Lo, RN)  Mobility/activity is maintained at optimal level for patient:   Encourage independent activity per ability   Plan activities to conserve energy, plan rest periods  Goal: No bleeding  Outcome: Progressing  Flowsheets (Taken 05/16/2021 0103 by Theresia Lo, RN)  No bleeding:   Monitor and assess vitals and hemodynamic parameters   Monitor/assess lab values and report abnormal values

## 2021-05-16 NOTE — Anesthesia Postprocedure Evaluation (Signed)
Anesthesia Post Evaluation    Patient: [name] [name]    Procedure(s):  COLONOSCOPY    Anesthesia type: MAC    Last Vitals:   Vitals Value Taken Time   BP 149/61 05/16/21 1432   Temp 36.6 C (97.8 F) 05/16/21 1350   Pulse 64 05/16/21 1432   Resp 12 05/16/21 1432   SpO2 98 % 05/16/21 1432                 Anesthesia Post Evaluation:       Level of Consciousness: awake    Pain Management: adequate    Airway Patency: patent          Cardiovascular status: acceptable  Respiratory status: acceptable  Hydration status: acceptable        Signed by: Morrie Sheldon, MD, 05/16/2021 2:34 PM

## 2021-05-16 NOTE — Brief Op Note (Signed)
BRIEF OP NOTE    Date Time: 05/16/21 2:38 PM    Patient Name:   [name] [name]    Date of Operation:   05/16/2021    Providers Performing:   Surgeon(s):  Jayme Cloud, MD    Assistant (s):   Circulator: Golden Hurter, RN    Operative Procedure:   Procedure(s):  COLONOSCOPY - Wound Class: Clean Contaminated     Preoperative Diagnosis:   Pre-Op Diagnosis Codes:     * Gastrointestinal hemorrhage, unspecified gastrointestinal hemorrhage type [K92.2]    Postoperative Diagnosis:   Post-Op Diagnosis Codes:     * Gastrointestinal hemorrhage, unspecified gastrointestinal hemorrhage type [K92.2]    Anesthesia:   Conscious Sedation    Findings:   Non-bleeding internal hemorrhoids.   - Diverticulosis in the sigmoid colon, in the descending colon and in the transverse colon.   - Diverticulosis in the ascending colon.   - The examined portion of the ileum was normal.   - Two 6 to 13 mm polyps in the cecum.  Resection not attempted because of GI bleed age,  and DNR status  - No specimens collected.   No abnromaility seen in rectum.     See report for further details.   IMP: Return patient to hospital ward for ongoing care. No active bleeding seen. Likely diverticular source.   Follow for signs of  further bleeding  follow hgb.   Plse call with any questions.          Signed by: Jayme Cloud, MD                                                                           [facility]

## 2021-05-16 NOTE — Plan of Care (Addendum)
NURSE NOTE SUMMARY  [facility] - GI/ENDO/GEN MED   Patient Name: [name] [name],[name] [name]   Attending Physician: Darra Lis, MD   Today's date:   05/16/2021 LOS: 1 days   Shift Summary:                                                              Assumed care of pt at 2300. Rested for about a 2 hour interval. Resp unlabored. VSS. Tele - NSR prior to pt's refusal to wear / Dr Theodoro Grist notified via Epic chat - message read/ no new orders. Completed all 4 liters Gol[place]. Up mutiple times to the BR - passing maroon liquid at last visualization. Awaiting next H&H result. Up ad lib but does use BR call ight. No distress.     Provider Notifications:        Rapid Response Notifications:  Mobility:      PMP Activity: Step 6 - Walks in Room (05/15/2021  8:30 PM)     Weight tracking:  Family Dynamic:   Last 3 Weights for the past 72 hrs (Last 3 readings):   Weight   05/15/21 1832 98.9 kg (218 lb)   05/15/21 1250 97.1 kg (214 lb)             Last Bowel Movement   Last BM Date: 05/15/21      Problem: Altered GI Function  Goal: Fluid and electrolyte balance are achieved/maintained  Outcome: Progressing  Flowsheets (Taken 05/16/2021 0103)  Fluid and electrolyte balance are achieved/maintained:   Monitor intake and output every shift   Monitor/assess lab values and report abnormal values   Observe for cardiac arrhythmias  Goal: Mobility/Activity is maintained at optimal level for patient  Outcome: Progressing  Flowsheets (Taken 05/16/2021 0103)  Mobility/activity is maintained at optimal level for patient:   Encourage independent activity per ability   Plan activities to conserve energy, plan rest periods  Goal: No bleeding  Outcome: Progressing  Flowsheets (Taken 05/16/2021 0103)  No bleeding:   Monitor and assess vitals and hemodynamic parameters   Monitor/assess lab values and report abnormal values

## 2021-05-17 ENCOUNTER — Encounter (HOSPITAL_BASED_OUTPATIENT_CLINIC_OR_DEPARTMENT_OTHER): Payer: Self-pay | Admitting: Gastroenterology

## 2021-10-07 ENCOUNTER — Ambulatory Visit (INDEPENDENT_AMBULATORY_CARE_PROVIDER_SITE_OTHER): Payer: Medicare Other | Admitting: Physical Medicine & Rehabilitation

## 2021-11-25 ENCOUNTER — Ambulatory Visit (INDEPENDENT_AMBULATORY_CARE_PROVIDER_SITE_OTHER): Payer: Medicare Other | Admitting: Physical Medicine & Rehabilitation

## 2022-01-10 ENCOUNTER — Ambulatory Visit (INDEPENDENT_AMBULATORY_CARE_PROVIDER_SITE_OTHER): Payer: Medicare Other | Admitting: Physical Medicine & Rehabilitation
